# Patient Record
Sex: Male | Born: 1945
Health system: Southern US, Community
[De-identification: ages and names within clinical notes are randomized; demographics above are authoritative.]

## PROBLEM LIST (undated history)

## (undated) DIAGNOSIS — C179 Malignant neoplasm of small intestine, unspecified: Secondary | ICD-10-CM

## (undated) DIAGNOSIS — E119 Type 2 diabetes mellitus without complications: Secondary | ICD-10-CM

## (undated) DIAGNOSIS — I10 Essential (primary) hypertension: Secondary | ICD-10-CM

## (undated) HISTORY — DX: Malignant neoplasm of small intestine, unspecified: C17.9

---

## 1997-09-13 ENCOUNTER — Other Ambulatory Visit: Admission: RE | Admit: 1997-09-13 | Discharge: 1997-09-13 | Payer: Self-pay | Admitting: Urology

## 1998-08-16 ENCOUNTER — Ambulatory Visit (HOSPITAL_COMMUNITY): Admission: RE | Admit: 1998-08-16 | Discharge: 1998-08-16 | Payer: Self-pay | Admitting: Gastroenterology

## 2000-06-11 ENCOUNTER — Encounter: Admission: RE | Admit: 2000-06-11 | Discharge: 2000-09-09 | Payer: Self-pay | Admitting: Unknown Physician Specialty

## 2012-08-02 DIAGNOSIS — E1129 Type 2 diabetes mellitus with other diabetic kidney complication: Secondary | ICD-10-CM | POA: Diagnosis not present

## 2012-08-02 DIAGNOSIS — E785 Hyperlipidemia, unspecified: Secondary | ICD-10-CM | POA: Diagnosis not present

## 2012-08-02 DIAGNOSIS — I10 Essential (primary) hypertension: Secondary | ICD-10-CM | POA: Diagnosis not present

## 2012-12-20 DIAGNOSIS — Z125 Encounter for screening for malignant neoplasm of prostate: Secondary | ICD-10-CM | POA: Diagnosis not present

## 2012-12-20 DIAGNOSIS — E785 Hyperlipidemia, unspecified: Secondary | ICD-10-CM | POA: Diagnosis not present

## 2012-12-20 DIAGNOSIS — I10 Essential (primary) hypertension: Secondary | ICD-10-CM | POA: Diagnosis not present

## 2012-12-20 DIAGNOSIS — Z0000 Encounter for general adult medical examination without abnormal findings: Secondary | ICD-10-CM | POA: Diagnosis not present

## 2012-12-20 DIAGNOSIS — E1129 Type 2 diabetes mellitus with other diabetic kidney complication: Secondary | ICD-10-CM | POA: Diagnosis not present

## 2013-04-18 DIAGNOSIS — E1129 Type 2 diabetes mellitus with other diabetic kidney complication: Secondary | ICD-10-CM | POA: Diagnosis not present

## 2013-04-18 DIAGNOSIS — I10 Essential (primary) hypertension: Secondary | ICD-10-CM | POA: Diagnosis not present

## 2013-04-18 DIAGNOSIS — E785 Hyperlipidemia, unspecified: Secondary | ICD-10-CM | POA: Diagnosis not present

## 2013-05-13 DIAGNOSIS — L723 Sebaceous cyst: Secondary | ICD-10-CM | POA: Diagnosis not present

## 2013-08-30 DIAGNOSIS — E785 Hyperlipidemia, unspecified: Secondary | ICD-10-CM | POA: Diagnosis not present

## 2013-08-30 DIAGNOSIS — E1129 Type 2 diabetes mellitus with other diabetic kidney complication: Secondary | ICD-10-CM | POA: Diagnosis not present

## 2013-08-30 DIAGNOSIS — I10 Essential (primary) hypertension: Secondary | ICD-10-CM | POA: Diagnosis not present

## 2013-08-30 DIAGNOSIS — N183 Chronic kidney disease, stage 3 unspecified: Secondary | ICD-10-CM | POA: Diagnosis not present

## 2014-01-20 DIAGNOSIS — I129 Hypertensive chronic kidney disease with stage 1 through stage 4 chronic kidney disease, or unspecified chronic kidney disease: Secondary | ICD-10-CM | POA: Diagnosis not present

## 2014-01-20 DIAGNOSIS — N183 Chronic kidney disease, stage 3 unspecified: Secondary | ICD-10-CM | POA: Diagnosis not present

## 2014-01-20 DIAGNOSIS — I10 Essential (primary) hypertension: Secondary | ICD-10-CM | POA: Diagnosis not present

## 2014-01-20 DIAGNOSIS — Z1331 Encounter for screening for depression: Secondary | ICD-10-CM | POA: Diagnosis not present

## 2014-01-20 DIAGNOSIS — E1129 Type 2 diabetes mellitus with other diabetic kidney complication: Secondary | ICD-10-CM | POA: Diagnosis not present

## 2014-01-20 DIAGNOSIS — Z0000 Encounter for general adult medical examination without abnormal findings: Secondary | ICD-10-CM | POA: Diagnosis not present

## 2014-01-20 DIAGNOSIS — E7800 Pure hypercholesterolemia, unspecified: Secondary | ICD-10-CM | POA: Diagnosis not present

## 2014-01-20 DIAGNOSIS — Z125 Encounter for screening for malignant neoplasm of prostate: Secondary | ICD-10-CM | POA: Diagnosis not present

## 2014-01-20 DIAGNOSIS — Z23 Encounter for immunization: Secondary | ICD-10-CM | POA: Diagnosis not present

## 2014-03-23 DIAGNOSIS — H2513 Age-related nuclear cataract, bilateral: Secondary | ICD-10-CM | POA: Diagnosis not present

## 2014-03-23 DIAGNOSIS — H40033 Anatomical narrow angle, bilateral: Secondary | ICD-10-CM | POA: Diagnosis not present

## 2014-05-18 DIAGNOSIS — D631 Anemia in chronic kidney disease: Secondary | ICD-10-CM | POA: Diagnosis not present

## 2014-05-18 DIAGNOSIS — E1122 Type 2 diabetes mellitus with diabetic chronic kidney disease: Secondary | ICD-10-CM | POA: Diagnosis not present

## 2014-05-18 DIAGNOSIS — I10 Essential (primary) hypertension: Secondary | ICD-10-CM | POA: Diagnosis not present

## 2014-05-18 DIAGNOSIS — E785 Hyperlipidemia, unspecified: Secondary | ICD-10-CM | POA: Diagnosis not present

## 2014-05-18 DIAGNOSIS — N183 Chronic kidney disease, stage 3 (moderate): Secondary | ICD-10-CM | POA: Diagnosis not present

## 2014-05-18 DIAGNOSIS — Z23 Encounter for immunization: Secondary | ICD-10-CM | POA: Diagnosis not present

## 2014-08-17 ENCOUNTER — Encounter (HOSPITAL_COMMUNITY): Payer: Self-pay

## 2014-08-17 ENCOUNTER — Observation Stay (HOSPITAL_COMMUNITY)
Admission: EM | Admit: 2014-08-17 | Discharge: 2014-08-19 | Disposition: A | Discharge status: Home / Self Care | Payer: Medicare Other | Attending: Internal Medicine | Admitting: Internal Medicine

## 2014-08-17 ENCOUNTER — Observation Stay (HOSPITAL_COMMUNITY): Payer: Medicare Other

## 2014-08-17 DIAGNOSIS — C171 Malignant neoplasm of jejunum: Principal | ICD-10-CM | POA: Insufficient documentation

## 2014-08-17 DIAGNOSIS — Z79899 Other long term (current) drug therapy: Secondary | ICD-10-CM | POA: Diagnosis not present

## 2014-08-17 DIAGNOSIS — K922 Gastrointestinal hemorrhage, unspecified: Secondary | ICD-10-CM | POA: Diagnosis present

## 2014-08-17 DIAGNOSIS — R5383 Other fatigue: Secondary | ICD-10-CM | POA: Insufficient documentation

## 2014-08-17 DIAGNOSIS — Z7982 Long term (current) use of aspirin: Secondary | ICD-10-CM | POA: Diagnosis not present

## 2014-08-17 DIAGNOSIS — N179 Acute kidney failure, unspecified: Secondary | ICD-10-CM | POA: Diagnosis not present

## 2014-08-17 DIAGNOSIS — D649 Anemia, unspecified: Secondary | ICD-10-CM | POA: Diagnosis not present

## 2014-08-17 DIAGNOSIS — Z794 Long term (current) use of insulin: Secondary | ICD-10-CM | POA: Insufficient documentation

## 2014-08-17 DIAGNOSIS — E119 Type 2 diabetes mellitus without complications: Secondary | ICD-10-CM | POA: Insufficient documentation

## 2014-08-17 DIAGNOSIS — I10 Essential (primary) hypertension: Secondary | ICD-10-CM | POA: Insufficient documentation

## 2014-08-17 DIAGNOSIS — E43 Unspecified severe protein-calorie malnutrition: Secondary | ICD-10-CM | POA: Diagnosis present

## 2014-08-17 HISTORY — DX: Essential (primary) hypertension: I10

## 2014-08-17 HISTORY — DX: Type 2 diabetes mellitus without complications: E11.9

## 2014-08-17 LAB — CBC WITH DIFFERENTIAL/PLATELET
BASOS ABS: 0 10*3/uL (ref 0.0–0.1)
BASOS PCT: 0 % (ref 0–1)
BASOS PCT: 0 % (ref 0–1)
Basophils Absolute: 0 10*3/uL (ref 0.0–0.1)
EOS ABS: 0.2 10*3/uL (ref 0.0–0.7)
EOS PCT: 2 % (ref 0–5)
EOS PCT: 2 % (ref 0–5)
Eosinophils Absolute: 0.2 10*3/uL (ref 0.0–0.7)
HEMATOCRIT: 25.6 % — AB (ref 39.0–52.0)
HEMATOCRIT: 21.3 % — AB (ref 39.0–52.0)
Hemoglobin: 7.6 g/dL — ABNORMAL LOW (ref 13.0–17.0)
Hemoglobin: 6.5 g/dL — CL (ref 13.0–17.0)
LYMPHS ABS: 1.7 10*3/uL (ref 0.7–4.0)
Lymphocytes Relative: 13 % (ref 12–46)
Lymphocytes Relative: 20 % (ref 12–46)
Lymphs Abs: 2.1 10*3/uL (ref 0.7–4.0)
MCH: 22.8 pg — AB (ref 26.0–34.0)
MCH: 23.2 pg — AB (ref 26.0–34.0)
MCHC: 29.7 g/dL — ABNORMAL LOW (ref 30.0–36.0)
MCHC: 30.5 g/dL (ref 30.0–36.0)
MCV: 76.6 fL — ABNORMAL LOW (ref 78.0–100.0)
MCV: 76.1 fL — AB (ref 78.0–100.0)
Monocytes Absolute: 1 10*3/uL (ref 0.1–1.0)
Monocytes Absolute: 0.7 10*3/uL (ref 0.1–1.0)
Monocytes Relative: 7 % (ref 3–12)
Monocytes Relative: 7 % (ref 3–12)
NEUTROS ABS: 10.2 10*3/uL — AB (ref 1.7–7.7)
NEUTROS ABS: 7.7 10*3/uL (ref 1.7–7.7)
Neutrophils Relative %: 78 % — ABNORMAL HIGH (ref 43–77)
Neutrophils Relative %: 72 % (ref 43–77)
PLATELETS: 439 10*3/uL — AB (ref 150–400)
Platelets: 365 10*3/uL (ref 150–400)
RBC: 3.34 MIL/uL — ABNORMAL LOW (ref 4.22–5.81)
RBC: 2.8 MIL/uL — ABNORMAL LOW (ref 4.22–5.81)
RDW: 16.6 % — ABNORMAL HIGH (ref 11.5–15.5)
RDW: 16.6 % — AB (ref 11.5–15.5)
WBC: 13.1 10*3/uL — ABNORMAL HIGH (ref 4.0–10.5)
WBC: 10.8 10*3/uL — ABNORMAL HIGH (ref 4.0–10.5)

## 2014-08-17 LAB — COMPREHENSIVE METABOLIC PANEL
ALT: 21 U/L (ref 0–53)
AST: 35 U/L (ref 0–37)
Albumin: 3.4 g/dL — ABNORMAL LOW (ref 3.5–5.2)
Alkaline Phosphatase: 96 U/L (ref 39–117)
Anion gap: 9 (ref 5–15)
BILIRUBIN TOTAL: 0.5 mg/dL (ref 0.3–1.2)
BUN: 34 mg/dL — AB (ref 6–23)
CALCIUM: 9 mg/dL (ref 8.4–10.5)
CHLORIDE: 103 mmol/L (ref 96–112)
CO2: 23 mmol/L (ref 19–32)
CREATININE: 1.74 mg/dL — AB (ref 0.50–1.35)
GFR calc Af Amer: 44 mL/min — ABNORMAL LOW (ref >90)
GFR, EST NON AFRICAN AMERICAN: 38 mL/min — AB (ref >90)
Glucose, Bld: 158 mg/dL — ABNORMAL HIGH (ref 70–99)
Potassium: 4.5 mmol/L (ref 3.5–5.1)
Sodium: 135 mmol/L (ref 135–145)
Total Protein: 7.8 g/dL (ref 6.0–8.3)

## 2014-08-17 LAB — CBG MONITORING, ED: Glucose-Capillary: 109 mg/dL — ABNORMAL HIGH (ref 70–99)

## 2014-08-17 LAB — POC OCCULT BLOOD, ED: Fecal Occult Bld: POSITIVE — AB

## 2014-08-17 LAB — ABO/RH: ABO/RH(D): A POS

## 2014-08-17 MED ORDER — INSULIN ASPART 100 UNIT/ML ~~LOC~~ SOLN: 0.0000 [IU] | SUBCUTANEOUS | Status: DC

## 2014-08-17 MED ORDER — ATORVASTATIN CALCIUM 20 MG PO TABS
20.0000 mg | ORAL_TABLET | Freq: Every day | ORAL | Status: DC
  Administered 2014-08-18: 20 mg via ORAL
  Filled 2014-08-17 (×2): qty 1

## 2014-08-17 MED ORDER — INSULIN GLARGINE 100 UNIT/ML ~~LOC~~ SOLN
40.0000 [IU] | Freq: Every day | SUBCUTANEOUS | Status: DC
  Administered 2014-08-18 (×2): 40 [IU] via SUBCUTANEOUS
  Filled 2014-08-17 (×2): qty 0.4

## 2014-08-17 MED ORDER — SODIUM CHLORIDE 0.9 % IV SOLN
INTRAVENOUS | Status: DC
  Administered 2014-08-17: 23:00:00 via INTRAVENOUS

## 2014-08-17 MED ORDER — PANTOPRAZOLE SODIUM 40 MG IV SOLR
40.0000 mg | INTRAVENOUS | Status: DC
  Administered 2014-08-17: 40 mg via INTRAVENOUS
  Filled 2014-08-17: qty 40

## 2014-08-17 NOTE — ED Notes (Signed)
Pt referred to ED by Dr Delfina Redwood for low hemoglobin. Pt reports that his hemoglobin was at 7. Pt states that his blood was taken today at 1600 and was given results at 1700.  Pt reports that he went to MD about his chronic fatigue and cough.  Pt reports that he's been feeling tired 3-4 weeks.

## 2014-08-17 NOTE — ED Notes (Signed)
Called floor to give RN report x2 no answer.

## 2014-08-17 NOTE — H&P (Signed)
Hospitalist Admission History and Physical  Patient name: Tyler Evans Medical record number: [id number] Date of birth: 1945-07-02 Age: 69 y.o. Gender: male  Primary Care Provider: No primary care provider on file.  Chief Complaint: anemia, GIB, AKI   History of Present Illness:This is a 69 y.o. year old male with significant past medical history of HTN, type 2 DM presenting with anemia, GIB, AKI. Pt reports 3-4 weeks of mailaise and fatigue. Was seen by PCP today about sxs. Was redirected to ER upon notification of low hgb. Pt denies any diarrhea. No black/ tarry stools. No vomiting. No fevers or chills. Denies any excess NSAID use. On baby ASA. Had colonoscopy 5 years ago w/ small amount of polyps per pt. Has had 10 lb weight loss over past 1-2 weeks.  Presented to ER afebrile, BP in 90s-100s. WBC 13.1, hgb 7.6, Cr 1.74. Hemoccult positive.    Assessment and Plan:  Active Problems:   GIB (gastrointestinal bleeding)   Anemia   AKI (acute kidney injury)   1- Anemia  -symptomatic -likely secondary to acute blood loss in setting of GIB  -transfuse 1 unit pRBC -anemia panel  -serial CBCs  -follow  2- GIB -hemoccult positive on presentation -PPI  -hold offending agents  -GI c/s in am   3-AKI  -suspect prerenal etiology +/- hypertensive/diabetic nephropathy  -dry on exam  -BUN: Cr ratio 20:1 -Hydrate pt  -hold offending agents  -f/u imaging  4-DM -SSI  -A1C  -lantus  -hold orals   FEN/GI: NPO  Prophylaxis: SCDs  Disposition: pending further evaluation  Code Status:Full Code    Patient Active Problem List   Diagnosis Date Noted  . GIB (gastrointestinal bleeding) 08/17/2014  . Anemia 08/17/2014  . AKI (acute kidney injury) 08/17/2014   Past Medical History: Past Medical History  Diagnosis Date  . Diabetes mellitus without complication   . Hypertension     Past Surgical History: History reviewed. No pertinent past surgical history.  Social  History: History   Social History  . Marital Status: Married    Spouse Name: N/A  . Number of Children: N/A  . Years of Education: N/A   Social History Main Topics  . Smoking status: Never Smoker   . Smokeless tobacco: Not on file  . Alcohol Use: No  . Drug Use: Not on file  . Sexual Activity: Not on file   Other Topics Concern  . None   Social History Narrative  . None    Family History: Family History  Problem Relation Age of Onset  . Heart failure Mother   . Cancer Father   . Cancer Brother     Allergies: Allergies  Allergen Reactions  . Penicillins Nausea And Vomiting    Current Facility-Administered Medications  Medication Dose Route Frequency Provider Last Rate Last Dose  . 0.9 %  sodium chloride infusion   Intravenous Continuous Deneise Lever, MD      . Derrill Memo ON 08/18/2014] atorvastatin (LIPITOR) tablet 20 mg  20 mg Oral Daily Deneise Lever, MD      . Derrill Memo ON 08/18/2014] insulin aspart (novoLOG) injection 0-9 Units  0-9 Units Subcutaneous 6 times per day Deneise Lever, MD      . insulin glargine (LANTUS) injection 40 Units  40 Units Subcutaneous QHS Deneise Lever, MD      . pantoprazole (PROTONIX) injection 40 mg  40 mg Intravenous Q24H Deneise Lever, MD       Current Outpatient Prescriptions  Medication Sig Dispense Refill  . aspirin EC 81 MG tablet Take 81 mg by mouth at bedtime.    Marland Kitchen atorvastatin (LIPITOR) 20 MG tablet Take 20 mg by mouth daily.    . chlorthalidone (HYGROTON) 25 MG tablet Take 25 mg by mouth daily.    Marland Kitchen glipiZIDE (GLUCOTROL) 10 MG tablet Take 10 mg by mouth 2 (two) times daily before a meal.    . insulin glargine (LANTUS) 100 UNIT/ML injection Inject 40 Units into the skin at bedtime.    Marland Kitchen lisinopril (PRINIVIL,ZESTRIL) 40 MG tablet Take 40 mg by mouth at bedtime.    . metFORMIN (GLUCOPHAGE) 1000 MG tablet Take 1,000 mg by mouth 2 (two) times daily with a meal.     Review Of Systems: 12 point ROS negative except as noted  above in HPI.  Physical Exam: Filed Vitals:   08/17/14 2216  BP: 99/46  Pulse: 91  Temp: 98.3 F (36.8 C)  Resp: 18    General: alert and cooperative HEENT: PERRLA and extra ocular movement intact Heart: S1, S2 normal, no murmur, rub or gallop, regular rate and rhythm Lungs: clear to auscultation, no wheezes or rales and unlabored breathing Abdomen: + mild abd distension, + bowel sounds, minimal abd TTP Extremities: extremities normal, atraumatic, no cyanosis or edema Skin:no rashes Neurology: normal without focal findings  Labs and Imaging: Lab Results  Component Value Date/Time   NA 135 08/17/2014 06:24 PM   K 4.5 08/17/2014 06:24 PM   CL 103 08/17/2014 06:24 PM   CO2 23 08/17/2014 06:24 PM   BUN 34* 08/17/2014 06:24 PM   CREATININE 1.74* 08/17/2014 06:24 PM   GLUCOSE 158* 08/17/2014 06:24 PM   Lab Results  Component Value Date   WBC 13.1* 08/17/2014   HGB 7.6* 08/17/2014   HCT 25.6* 08/17/2014   MCV 76.6* 08/17/2014   PLT 439* 08/17/2014    No results found.         Shanda Howells MD  Pager: [phone]

## 2014-08-18 ENCOUNTER — Encounter (HOSPITAL_COMMUNITY): Payer: Self-pay

## 2014-08-18 ENCOUNTER — Observation Stay (HOSPITAL_COMMUNITY): Payer: Medicare Other | Admitting: Anesthesiology

## 2014-08-18 ENCOUNTER — Encounter (HOSPITAL_COMMUNITY)
Admission: EM | Disposition: A | Discharge status: Home / Self Care | Payer: Self-pay | Source: Home / Self Care | Attending: Emergency Medicine

## 2014-08-18 DIAGNOSIS — C179 Malignant neoplasm of small intestine, unspecified: Secondary | ICD-10-CM

## 2014-08-18 DIAGNOSIS — C801 Malignant (primary) neoplasm, unspecified: Secondary | ICD-10-CM

## 2014-08-18 DIAGNOSIS — D649 Anemia, unspecified: Secondary | ICD-10-CM | POA: Diagnosis not present

## 2014-08-18 DIAGNOSIS — N179 Acute kidney failure, unspecified: Secondary | ICD-10-CM

## 2014-08-18 DIAGNOSIS — C171 Malignant neoplasm of jejunum: Secondary | ICD-10-CM | POA: Diagnosis not present

## 2014-08-18 DIAGNOSIS — K922 Gastrointestinal hemorrhage, unspecified: Secondary | ICD-10-CM | POA: Diagnosis not present

## 2014-08-18 DIAGNOSIS — D62 Acute posthemorrhagic anemia: Secondary | ICD-10-CM

## 2014-08-18 HISTORY — DX: Malignant neoplasm of small intestine, unspecified: C17.9

## 2014-08-18 HISTORY — PX: ENTEROSCOPY: SHX5533

## 2014-08-18 LAB — CBC WITH DIFFERENTIAL/PLATELET
Basophils Absolute: 0 10*3/uL (ref 0.0–0.1)
Basophils Absolute: 0 10*3/uL (ref 0.0–0.1)
Basophils Relative: 0 % (ref 0–1)
Basophils Relative: 0 % (ref 0–1)
EOS ABS: 0.2 10*3/uL (ref 0.0–0.7)
EOS ABS: 0.2 10*3/uL (ref 0.0–0.7)
EOS PCT: 3 % (ref 0–5)
Eosinophils Relative: 2 % (ref 0–5)
HCT: 26.8 % — ABNORMAL LOW (ref 39.0–52.0)
HEMATOCRIT: 22.6 % — AB (ref 39.0–52.0)
HEMOGLOBIN: 7.1 g/dL — AB (ref 13.0–17.0)
Hemoglobin: 8.4 g/dL — ABNORMAL LOW (ref 13.0–17.0)
LYMPHS ABS: 1.8 10*3/uL (ref 0.7–4.0)
LYMPHS PCT: 19 % (ref 12–46)
LYMPHS PCT: 19 % (ref 12–46)
Lymphs Abs: 1.8 10*3/uL (ref 0.7–4.0)
MCH: 24.5 pg — ABNORMAL LOW (ref 26.0–34.0)
MCH: 25 pg — ABNORMAL LOW (ref 26.0–34.0)
MCHC: 31.4 g/dL (ref 30.0–36.0)
MCHC: 31.3 g/dL (ref 30.0–36.0)
MCV: 77.9 fL — AB (ref 78.0–100.0)
MCV: 79.8 fL (ref 78.0–100.0)
MONO ABS: 1 10*3/uL (ref 0.1–1.0)
MONOS PCT: 11 % (ref 3–12)
Monocytes Absolute: 0.7 10*3/uL (ref 0.1–1.0)
Monocytes Relative: 7 % (ref 3–12)
NEUTROS ABS: 6.2 10*3/uL (ref 1.7–7.7)
NEUTROS PCT: 72 % (ref 43–77)
Neutro Abs: 6.8 10*3/uL (ref 1.7–7.7)
Neutrophils Relative %: 67 % (ref 43–77)
Platelets: 372 10*3/uL (ref 150–400)
Platelets: 342 10*3/uL (ref 150–400)
RBC: 2.9 MIL/uL — ABNORMAL LOW (ref 4.22–5.81)
RBC: 3.36 MIL/uL — AB (ref 4.22–5.81)
RDW: 17.2 % — AB (ref 11.5–15.5)
RDW: 17.4 % — AB (ref 11.5–15.5)
WBC: 9.3 10*3/uL (ref 4.0–10.5)
WBC: 9.5 10*3/uL (ref 4.0–10.5)

## 2014-08-18 LAB — COMPREHENSIVE METABOLIC PANEL
ALT: 17 U/L (ref 0–53)
ANION GAP: 9 (ref 5–15)
AST: 30 U/L (ref 0–37)
Albumin: 2.8 g/dL — ABNORMAL LOW (ref 3.5–5.2)
Alkaline Phosphatase: 81 U/L (ref 39–117)
BUN: 33 mg/dL — AB (ref 6–23)
CO2: 25 mmol/L (ref 19–32)
Calcium: 8.4 mg/dL (ref 8.4–10.5)
Chloride: 103 mmol/L (ref 96–112)
Creatinine, Ser: 1.54 mg/dL — ABNORMAL HIGH (ref 0.50–1.35)
GFR, EST AFRICAN AMERICAN: 51 mL/min — AB (ref >90)
GFR, EST NON AFRICAN AMERICAN: 44 mL/min — AB (ref >90)
GLUCOSE: 64 mg/dL — AB (ref 70–99)
Potassium: 4.3 mmol/L (ref 3.5–5.1)
SODIUM: 137 mmol/L (ref 135–145)
TOTAL PROTEIN: 6.5 g/dL (ref 6.0–8.3)
Total Bilirubin: 0.6 mg/dL (ref 0.3–1.2)

## 2014-08-18 LAB — CBC
HCT: 28 % — ABNORMAL LOW (ref 39.0–52.0)
HEMOGLOBIN: 8.7 g/dL — AB (ref 13.0–17.0)
MCH: 24.9 pg — ABNORMAL LOW (ref 26.0–34.0)
MCHC: 31.1 g/dL (ref 30.0–36.0)
MCV: 80 fL (ref 78.0–100.0)
Platelets: 384 10*3/uL (ref 150–400)
RBC: 3.5 MIL/uL — ABNORMAL LOW (ref 4.22–5.81)
RDW: 17.5 % — AB (ref 11.5–15.5)
WBC: 12 10*3/uL — ABNORMAL HIGH (ref 4.0–10.5)

## 2014-08-18 LAB — GLUCOSE, CAPILLARY
GLUCOSE-CAPILLARY: 104 mg/dL — AB (ref 70–99)
GLUCOSE-CAPILLARY: 144 mg/dL — AB (ref 70–99)
Glucose-Capillary: 88 mg/dL (ref 70–99)
Glucose-Capillary: 57 mg/dL — ABNORMAL LOW (ref 70–99)
Glucose-Capillary: 121 mg/dL — ABNORMAL HIGH (ref 70–99)

## 2014-08-18 LAB — PREPARE RBC (CROSSMATCH)

## 2014-08-18 LAB — MRSA PCR SCREENING: MRSA by PCR: NEGATIVE

## 2014-08-18 SURGERY — ENTEROSCOPY
Anesthesia: Monitor Anesthesia Care

## 2014-08-18 MED ORDER — PROPOFOL 10 MG/ML IV BOLUS
INTRAVENOUS | Status: AC
  Filled 2014-08-18: qty 20

## 2014-08-18 MED ORDER — LIDOCAINE HCL 1 % IJ SOLN
INTRAMUSCULAR | Status: DC | PRN
  Administered 2014-08-18 (×2): 50 mg via INTRADERMAL

## 2014-08-18 MED ORDER — GLUCAGON HCL RDNA (DIAGNOSTIC) 1 MG IJ SOLR
INTRAMUSCULAR | Status: AC
  Filled 2014-08-18: qty 1

## 2014-08-18 MED ORDER — MIDAZOLAM HCL 2 MG/2ML IJ SOLN
INTRAMUSCULAR | Status: AC
  Filled 2014-08-18: qty 2

## 2014-08-18 MED ORDER — SODIUM CHLORIDE 0.9 % IV SOLN: INTRAVENOUS | Status: DC

## 2014-08-18 MED ORDER — GLUCAGON HCL RDNA (DIAGNOSTIC) 1 MG IJ SOLR
INTRAMUSCULAR | Status: DC | PRN
  Administered 2014-08-18: 1 mg via INTRAVENOUS

## 2014-08-18 MED ORDER — DEXTROSE 50 % IV SOLN
INTRAVENOUS | Status: AC
  Administered 2014-08-18: 10:00:00
  Filled 2014-08-18: qty 50

## 2014-08-18 MED ORDER — LIDOCAINE HCL (CARDIAC) 20 MG/ML IV SOLN
INTRAVENOUS | Status: AC
  Filled 2014-08-18: qty 5

## 2014-08-18 MED ORDER — SODIUM CHLORIDE 0.9 % IV SOLN: Freq: Once | INTRAVENOUS | Status: AC

## 2014-08-18 MED ORDER — LACTATED RINGERS IV SOLN
INTRAVENOUS | Status: DC | PRN
  Administered 2014-08-18: 13:00:00 via INTRAVENOUS

## 2014-08-18 MED ORDER — MIDAZOLAM HCL 5 MG/5ML IJ SOLN
INTRAMUSCULAR | Status: DC | PRN
  Administered 2014-08-18 (×2): 1 mg via INTRAVENOUS

## 2014-08-18 MED ORDER — PANTOPRAZOLE SODIUM 40 MG IV SOLR
40.0000 mg | Freq: Two times a day (BID) | INTRAVENOUS | Status: DC
  Administered 2014-08-18 – 2014-08-19 (×2): 40 mg via INTRAVENOUS
  Filled 2014-08-18 (×2): qty 40

## 2014-08-18 MED ORDER — PROPOFOL INFUSION 10 MG/ML OPTIME
INTRAVENOUS | Status: DC | PRN
  Administered 2014-08-18: 180 ug/kg/min via INTRAVENOUS

## 2014-08-18 MED ORDER — SODIUM CHLORIDE 0.9 % IV SOLN
Freq: Once | INTRAVENOUS | Status: AC
  Administered 2014-08-18: 03:00:00 via INTRAVENOUS

## 2014-08-18 NOTE — Progress Notes (Signed)
Pt sent to enteroscopy with 1 unit RBC hanging. RN notified GI Charge RN Linna Hoff that pt was coming with blood. Charge RN assured RN that pt's blood would be closely monitored and that RN did not need to accompany pt to scope. Vital signs and blood admin documentation noted in [facility] and transfusion paperwork is in pt's paper chart.

## 2014-08-18 NOTE — Op Note (Signed)
[facility] [place], [address]   ENTEROSCOPY PROCEDURE REPORT     EXAM DATE: 08/18/2014  PATIENT NAME:      Tyler Evans, Tyler Evans           MR #:      [id number]  BIRTHDATE:       11/08/1945      VISIT #:     [id number]  ATTENDING:     Carol Ada, MD     STATUS:     outpatient ASSISTANT:      Vangie Bicker, and Lilli Light MD: ASA CLASS:        Class III  INDICATIONS:  The patient is a 69 yr old male here for an enteroscopy procedure due to abnormal CT scan. PROCEDURE PERFORMED:     Small bowel enteroscopy with biopsy  MEDICATIONS:     Monitored anesthesia care  CONSENT: The patient understands the risks and benefits of the procedure and understands that these risks include, but are not limited to: sedation, allergic reaction, infection, perforation and/or bleeding. Alternative means of evaluation and treatment include, among others: physical exam, x-rays, and/or surgical intervention. The patient elects to proceed with this endoscopic procedure.  DESCRIPTION OF PROCEDURE: During intra-op preparation period all mechanical & medical equipment was checked for proper function. Hand hygiene and appropriate measures for infection prevention was taken. After the risks, benefits and alternatives of the procedure were thoroughly explained, Informed consent was verified, confirmed and timeout was successfully executed by the treatment team. The    endoscope was introduced through the mouth and advanced to the proximal jejunum jejunum. The prep was The overall prep quality was excellent.. The instrument was then slowly withdrawn while examining the mucosa circumferentially. The scope was then completely withdrawn from the patient and the procedure terminated. The pulse, BP, and O2 saturation were monitored and documented by the physician and the nursing staff throughout the entire procedure.  The patient was cared  for as planned according to standard protocol, then discharged to recovery in stable condition and with appropriate post procedure care. Estimated blood loss is zero unless otherwise noted in this procedure report.  FINDINGS: The esophagus and gastric lumens were normal.  The pediatric colonoscope was advanced to the proximal jejunum and there was evidence of abnormal mucosa and friability.  The colonoscope was not able to traverse the area.  Cold biopsies were obtained and then the ultraslim colonoscope was employed.  This colonscope was able to traverse the area of stenosis.  This area was approximately 3 cm in length and 50% of the lumen was ulcerated.  More portions of the abnormal mucosa were obtained. Distal to this point the lumen was normal.    ADVERSE EVENTS:      There were no immediate complications.  IMPRESSIONS:     1) Proximal jejunal stenosis/ulceration/mass.   RECOMMENDATIONS:     1) Await biopsy results.  RECALL:  _____________________________ Carol Ada, MD eSigned:  Carol Ada, MD 08/18/2014 1:17 PM   cc:     PATIENT NAME:  Tyler Evans, Tyler Evans MR#: [id number]

## 2014-08-18 NOTE — Progress Notes (Signed)
Pt CBG in AM was 54. Pt asymptomatic. Hypoglycemic protocol initiated and followed. Dextrose 50% 77mL given. 20 minutes later follow-up CBG was 121.

## 2014-08-18 NOTE — ED Notes (Addendum)
Pt awaiting CT scan before transport up stairs.

## 2014-08-18 NOTE — Consult Note (Signed)
Reason for Consult: Abnormal CT scan Referring Physician: [facility]  [name] HPI: This is a 69 year old male with a PMH of DM and HTN, who is admitted for weakness and fatigue.  The patient was identified to have an HGB os 7.6 g/dL and a CT scan revealed multiple hepatic masses as well as a filling defect in the proximal jejunum.  He was evaluated by Dr. Collene Mares 5 years ago with findings of some small polyps.  His HGB after one unit of PRBC is at 7.1 g/dL.  Past Medical History  Diagnosis Date  . Diabetes mellitus without complication   . Hypertension     History reviewed. No pertinent past surgical history.  Family History  Problem Relation Age of Onset  . Heart failure Mother   . Cancer Father   . Cancer Brother     Social History:  reports that he has never smoked. He does not have any smokeless tobacco history on file. He reports that he does not drink alcohol. His drug history is not on file.  Allergies:  Allergies  Allergen Reactions  . Penicillins Nausea And Vomiting    Medications:  Scheduled: . sodium chloride   Intravenous Once  . atorvastatin  20 mg Oral q1800  . insulin aspart  0-9 Units Subcutaneous 6 times per day  . insulin glargine  40 Units Subcutaneous QHS  . pantoprazole (PROTONIX) IV  40 mg Intravenous Q12H   Continuous: . sodium chloride 125 mL/hr at 08/17/14 2324    Results for orders placed or performed during the hospital encounter of 08/17/14 (from the past 24 hour(s))  Comprehensive metabolic panel     Status: Abnormal   Collection Time: 08/17/14  6:24 PM  Result Value Ref Range   Sodium 135 135 - 145 mmol/L   Potassium 4.5 3.5 - 5.1 mmol/L   Chloride 103 96 - 112 mmol/L   CO2 23 19 - 32 mmol/L   Glucose, Bld 158 (H) 70 - 99 mg/dL   BUN 34 (H) 6 - 23 mg/dL   Creatinine, Ser 1.74 (H) 0.50 - 1.35 mg/dL   Calcium 9.0 8.4 - 10.5 mg/dL   Total Protein 7.8 6.0 - 8.3 g/dL   Albumin 3.4 (L) 3.5 - 5.2 g/dL   AST 35 0 - 37 U/L   ALT 21 0 - 53 U/L   Alkaline Phosphatase 96 39 - 117 U/L   Total Bilirubin 0.5 0.3 - 1.2 mg/dL   GFR calc non Af Amer 38 (L) >90 mL/min   GFR calc Af Amer 44 (L) >90 mL/min   Anion gap 9 5 - 15  CBC with Differential     Status: Abnormal   Collection Time: 08/17/14  6:24 PM  Result Value Ref Range   WBC 13.1 (H) 4.0 - 10.5 K/uL   RBC 3.34 (L) 4.22 - 5.81 MIL/uL   Hemoglobin 7.6 (L) 13.0 - 17.0 g/dL   HCT 25.6 (L) 39.0 - 52.0 %   MCV 76.6 (L) 78.0 - 100.0 fL   MCH 22.8 (L) 26.0 - 34.0 pg   MCHC 29.7 (L) 30.0 - 36.0 g/dL   RDW 16.6 (H) 11.5 - 15.5 %   Platelets 439 (H) 150 - 400 K/uL   Neutrophils Relative % 78 (H) 43 - 77 %   Neutro Abs 10.2 (H) 1.7 - 7.7 K/uL   Lymphocytes Relative 13 12 - 46 %   Lymphs Abs 1.7 0.7 - 4.0 K/uL   Monocytes Relative  7 3 - 12 %   Monocytes Absolute 1.0 0.1 - 1.0 K/uL   Eosinophils Relative 2 0 - 5 %   Eosinophils Absolute 0.2 0.0 - 0.7 K/uL   Basophils Relative 0 0 - 1 %   Basophils Absolute 0.0 0.0 - 0.1 K/uL  Type and screen for Red Blood Exchange     Status: None (Preliminary result)   Collection Time: 08/17/14  6:30 PM  Result Value Ref Range   ABO/RH(D) A POS    Antibody Screen NEG    Sample Expiration 08/20/2014    Unit Number [id number]    Blood Component Type RED CELLS,LR    Unit division 00    Status of Unit ISSUED    Transfusion Status OK TO TRANSFUSE    Crossmatch Result Compatible    Unit Number [id number]    Blood Component Type RED CELLS,LR    Unit division 00    Status of Unit ISSUED    Transfusion Status OK TO TRANSFUSE    Crossmatch Result Compatible   ABO/Rh     Status: None   Collection Time: 08/17/14  6:30 PM  Result Value Ref Range   ABO/RH(D) A POS   POC occult blood, ED     Status: Abnormal   Collection Time: 08/17/14  9:01 PM  Result Value Ref Range   Fecal Occult Bld POSITIVE (A) NEGATIVE  CBC WITH DIFFERENTIAL     Status: Abnormal   Collection Time: 08/17/14 11:23 PM  Result Value Ref Range   WBC  10.8 (H) 4.0 - 10.5 K/uL   RBC 2.80 (L) 4.22 - 5.81 MIL/uL   Hemoglobin 6.5 (LL) 13.0 - 17.0 g/dL   HCT 21.3 (L) 39.0 - 52.0 %   MCV 76.1 (L) 78.0 - 100.0 fL   MCH 23.2 (L) 26.0 - 34.0 pg   MCHC 30.5 30.0 - 36.0 g/dL   RDW 16.6 (H) 11.5 - 15.5 %   Platelets 365 150 - 400 K/uL   Neutrophils Relative % 72 43 - 77 %   Neutro Abs 7.7 1.7 - 7.7 K/uL   Lymphocytes Relative 20 12 - 46 %   Lymphs Abs 2.1 0.7 - 4.0 K/uL   Monocytes Relative 7 3 - 12 %   Monocytes Absolute 0.7 0.1 - 1.0 K/uL   Eosinophils Relative 2 0 - 5 %   Eosinophils Absolute 0.2 0.0 - 0.7 K/uL   Basophils Relative 0 0 - 1 %   Basophils Absolute 0.0 0.0 - 0.1 K/uL  CBG monitoring, ED     Status: Abnormal   Collection Time: 08/17/14 11:47 PM  Result Value Ref Range   Glucose-Capillary 109 (H) 70 - 99 mg/dL   Comment 1 Notify RN    Comment 2 Document in Chart   MRSA PCR Screening     Status: None   Collection Time: 08/18/14 12:45 AM  Result Value Ref Range   MRSA by PCR NEGATIVE NEGATIVE  Prepare RBC     Status: None   Collection Time: 08/18/14  1:30 AM  Result Value Ref Range   Order Confirmation ORDER PROCESSED BY BLOOD BANK   Glucose, capillary     Status: None   Collection Time: 08/18/14  3:41 AM  Result Value Ref Range   Glucose-Capillary 88 70 - 99 mg/dL  Prepare RBC     Status: None   Collection Time: 08/18/14  6:18 AM  Result Value Ref Range   Order Confirmation ORDER PROCESSED BY BLOOD BANK  Comprehensive metabolic panel     Status: Abnormal   Collection Time: 08/18/14  7:03 AM  Result Value Ref Range   Sodium 137 135 - 145 mmol/L   Potassium 4.3 3.5 - 5.1 mmol/L   Chloride 103 96 - 112 mmol/L   CO2 25 19 - 32 mmol/L   Glucose, Bld 64 (L) 70 - 99 mg/dL   BUN 33 (H) 6 - 23 mg/dL   Creatinine, Ser 1.54 (H) 0.50 - 1.35 mg/dL   Calcium 8.4 8.4 - 10.5 mg/dL   Total Protein 6.5 6.0 - 8.3 g/dL   Albumin 2.8 (L) 3.5 - 5.2 g/dL   AST 30 0 - 37 U/L   ALT 17 0 - 53 U/L   Alkaline Phosphatase 81 39 - 117  U/L   Total Bilirubin 0.6 0.3 - 1.2 mg/dL   GFR calc non Af Amer 44 (L) >90 mL/min   GFR calc Af Amer 51 (L) >90 mL/min   Anion gap 9 5 - 15  CBC WITH DIFFERENTIAL     Status: Abnormal   Collection Time: 08/18/14  7:03 AM  Result Value Ref Range   WBC 9.3 4.0 - 10.5 K/uL   RBC 2.90 (L) 4.22 - 5.81 MIL/uL   Hemoglobin 7.1 (L) 13.0 - 17.0 g/dL   HCT 22.6 (L) 39.0 - 52.0 %   MCV 77.9 (L) 78.0 - 100.0 fL   MCH 24.5 (L) 26.0 - 34.0 pg   MCHC 31.4 30.0 - 36.0 g/dL   RDW 17.2 (H) 11.5 - 15.5 %   Platelets 372 150 - 400 K/uL   Neutrophils Relative % 67 43 - 77 %   Neutro Abs 6.2 1.7 - 7.7 K/uL   Lymphocytes Relative 19 12 - 46 %   Lymphs Abs 1.8 0.7 - 4.0 K/uL   Monocytes Relative 11 3 - 12 %   Monocytes Absolute 1.0 0.1 - 1.0 K/uL   Eosinophils Relative 3 0 - 5 %   Eosinophils Absolute 0.2 0.0 - 0.7 K/uL   Basophils Relative 0 0 - 1 %   Basophils Absolute 0.0 0.0 - 0.1 K/uL     Ct Abdomen Pelvis Wo Contrast  08/18/2014   CLINICAL DATA:  Chronic fatigue and cough. Feeling tired for the past 3-4 weeks. Positive hemoccult. Decreased hemoglobin. Initial encounter.  EXAM: CT ABDOMEN AND PELVIS WITHOUT CONTRAST  TECHNIQUE: Multidetector CT imaging of the abdomen and pelvis was performed following the standard protocol without IV contrast.  COMPARISON:  None.  FINDINGS: The visualized lung bases are clear.  Multiple large masses are seen within the liver, bulky in appearance. The largest of these measures perhaps 5.7 cm, though all of the masses are relatively large. This is unusual for a primary hepatic malignancy, though it remains a possibility. Metastatic disease is a concern. The spleen is unremarkable.  Stones are noted within the gallbladder; the gallbladder is otherwise unremarkable in appearance. The pancreas and adrenal glands are within normal limits.  There appears to be a focal filling defect within the proximal jejunum, measuring approximately 2.5 x 1.7 cm, with surrounding contrast.  There is mild adjacent jejunal wall thickening. Malignancy cannot be excluded. Capsule endoscopy could be considered for further evaluation, as deemed clinically appropriate.  Mild nonspecific perinephric stranding is noted bilaterally. The kidneys are otherwise unremarkable. There is no evidence of hydronephrosis. No renal or ureteral stones are seen.  No free fluid is identified. The small bowel is unremarkable in appearance. The stomach is within normal  limits. No acute vascular abnormalities are seen.  The appendix is normal in caliber, without evidence of appendicitis. Scattered diverticulosis is noted along the descending and proximal sigmoid colon, without evidence of diverticulitis. The colon is otherwise unremarkable.  The bladder is decompressed and not well assessed. The prostate is enlarged, measuring 5.3 cm in transverse dimension. No inguinal lymphadenopathy is seen.  No acute osseous abnormalities are identified.  IMPRESSION: 1. Multiple large masses noted within the liver, bulky in appearance. These measure up to 5.7 cm in size; all of the masses are relatively large. This is unusual for primary hepatic malignancy, though it remains a possibility. Metastatic disease is a concern. Would correlate with LFTs; these lesions may be the most amenable to biopsy. 2. Focal filling defect within the proximal jejunum, measuring 2.5 x 1.7 cm, with surrounding contrast. Mild adjacent jejunal wall thickening. Malignancy cannot be excluded. Capsule endoscopy could be considered for further evaluation, as deemed clinically appropriate. 3. Scattered diverticulosis along the descending and proximal sigmoid colon, without evidence of diverticulitis. 4. Enlarged prostate noted. 5. Mild cholelithiasis noted; gallbladder otherwise unremarkable.   Electronically Signed   By: Garald Balding M.D.   On: 08/18/2014 01:16    ROS:  As stated above in the HPI otherwise negative.  Blood pressure 117/45, pulse 68, temperature  98 F (36.7 C), temperature source Oral, resp. rate 16, height 5\' 6"  (1.676 m), weight 96.9 kg (213 lb 10 oz), SpO2 99 %.    PE: Gen: NAD, Alert and Oriented HEENT:  [place]/AT, EOMI Neck: Supple, no LAD Lungs: CTA Bilaterally CV: RRR without M/G/R ABM: Soft, NTND, +BS Ext: No C/C/E  Assessment/Plan: 1) Abnormal Jejunal filling defect. 2) Hepatic mets.   I will perform an enteroscopy today.  If it is negative, biopsy of the hepatic masses will be the next step.  If it is an adenocarcinoma, a repeat colonoscopy can be pursued.  Plan: 1) Enteroscopy now.  Jaylaa Gallion D 08/18/2014, 10:59 AM

## 2014-08-18 NOTE — Care Management Note (Signed)
CARE MANAGEMENT NOTE 08/18/2014  Patient:  Tyler Evans, Tyler Evans   Account Number:  [id number]  Date Initiated:  08/18/2014  Documentation initiated by:  DAVIS,RHONDA  Subjective/Objective Assessment:   gi bld and hgtb drop down to 6.5     Action/Plan:   home when stable   Anticipated DC Date:  08/21/2014   Anticipated DC Plan:  HOME/SELF CARE  In-house referral  NA      DC Planning Services  CM consult      PAC Choice  NA   Choice offered to / List presented to:  NA   DME arranged  NA           Status of service:  In process, will continue to follow Medicare Important Message given?   (If response is "NO", the following Medicare IM given date fields will be blank) Date Medicare IM given:   Medicare IM given by:   Date Additional Medicare IM given:   Additional Medicare IM given by:    Discharge Disposition:    Per UR Regulation:  Reviewed for med. necessity/level of care/duration of stay  If discussed at [place] of Stay Meetings, dates discussed:    Comments:  August 18, 2014/Rhonda L. Rosana Hoes, RN, BSN, CCM. Case Management [place] [phone] No discharge needs present of time of review. Gi bld hemo-ocult positive stools, tarry stools, hgb 6.5, bp lower than normal for patient. Recieved bld product and hgb now at 7.1

## 2014-08-18 NOTE — Anesthesia Preprocedure Evaluation (Signed)
Anesthesia Evaluation  Patient identified by MRN, date of birth, ID band Patient awake    Reviewed: Allergy & Precautions, NPO status , Patient's Chart, lab work & pertinent test results  Airway Mallampati: II  TM Distance: >3 FB Neck ROM: Full    Dental no notable dental hx.    Pulmonary neg pulmonary ROS,  breath sounds clear to auscultation  Pulmonary exam normal       Cardiovascular hypertension, Pt. on medications negative cardio ROS  Rhythm:Regular Rate:Normal     Neuro/Psych negative neurological ROS  negative psych ROS   GI/Hepatic negative GI ROS, Neg liver ROS,   Endo/Other  negative endocrine ROSdiabetes, Type 2, Oral Hypoglycemic Agents, Insulin Dependent  Renal/GU negative Renal ROS  negative genitourinary   Musculoskeletal negative musculoskeletal ROS (+)   Abdominal   Peds negative pediatric ROS (+)  Hematology negative hematology ROS (+)   Anesthesia Other Findings   Reproductive/Obstetrics negative OB ROS                             Anesthesia Physical Anesthesia Plan  ASA: II  Anesthesia Plan: MAC   Post-op Pain Management:    Induction: Intravenous  Airway Management Planned:   Additional Equipment:   Intra-op Plan:   Post-operative Plan:   Informed Consent: I have reviewed the patients History and Physical, chart, labs and discussed the procedure including the risks, benefits and alternatives for the proposed anesthesia with the patient or authorized representative who has indicated his/her understanding and acceptance.   Dental advisory given  Plan Discussed with: CRNA  Anesthesia Plan Comments:         Anesthesia Quick Evaluation

## 2014-08-18 NOTE — Progress Notes (Signed)
INITIAL NUTRITION ASSESSMENT  DOCUMENTATION CODES Per approved criteria  -Severe malnutrition in the context of chronic illness -Obesity Unspecified  Pt meets criteria for severe MALNUTRITION in the context of chronic illness as evidenced by 8.5% wt loss in >/= 1 month and reported po <75% of estimated needs for >1 month.  INTERVENTION: - Once diet advanced, add Glucerna Shake po TID, each supplement provides 220 kcal and 10 grams of protein - Diet advancement per MD - RD will continue to monitor  NUTRITION DIAGNOSIS: Inadequate oral intake related to inability to eat as evidenced by NPO.   Goal: Pt to meet >/= 90% of their estimated nutrition needs   Monitor:  Diet advancement, labs, po intake, weight  Reason for Assessment: Malnutrition Screening Tool  69 y.o. male  Admitting Dx: <principal problem not specified>  ASSESSMENT: 69 year old male with a PMH of DM and HTN, who is admitted for weakness and fatigue. The patient was identified to have an HGB os 7.6 g/dL and a CT scan revealed multiple hepatic masses as well as a filling defect in the proximal jejunum.  - Pt s/p enteroscopy 3/18 - Pt reports poor po for ~ 1 month prior to admission. He said that he "just didn't have an appetite." Reported 20 lb wt loss in the past month.  - Currently NPO due to GI bleed.  - Agreed to try Glucerna Shakes once diet advanced to improve po intake. Discussed foods that are good to increase calories and protein in diet.  - Labs reviewed - No signs of fat or muscle depletion.   Height: Ht Readings from Last 1 Encounters:  08/18/14 5\' 6"  (1.676 m)    Weight: Wt Readings from Last 1 Encounters:  08/18/14 213 lb 10 oz (96.9 kg)    Ideal Body Weight: 63.8 kg  % Ideal Body Weight: 152%  Wt Readings from Last 10 Encounters:  08/18/14 213 lb 10 oz (96.9 kg)    Usual Body Weight: 233 lbs  % Usual Body Weight: 91%  BMI:  Body mass index is 34.5 kg/(m^2).  Estimated  Nutritional Needs: Kcal: 1900-2100 Protein: 125-135 g Fluid: 2.0 L/day  Skin: intact  Diet Order: Diet NPO time specified  EDUCATION NEEDS: -Education needs addressed   Intake/Output Summary (Last 24 hours) at 08/18/14 1441 Last data filed at 08/18/14 1319  Gross per 24 hour  Intake   1515 ml  Output      0 ml  Net   1515 ml    Last BM: prior to admission   Labs:   Recent Labs Lab 08/17/14 1824 08/18/14 0703  NA 135 137  K 4.5 4.3  CL 103 103  CO2 23 25  BUN 34* 33*  CREATININE 1.74* 1.54*  CALCIUM 9.0 8.4  GLUCOSE 158* 64*    CBG (last 3)   Recent Labs  08/18/14 0921 08/18/14 1027 08/18/14 1409  GLUCAP 57* 121* 104*    Scheduled Meds: . sodium chloride   Intravenous Once  . atorvastatin  20 mg Oral q1800  . insulin aspart  0-9 Units Subcutaneous 6 times per day  . insulin glargine  40 Units Subcutaneous QHS  . pantoprazole (PROTONIX) IV  40 mg Intravenous Q12H    Continuous Infusions: . sodium chloride 125 mL/hr at 08/17/14 2324  . sodium chloride      Past Medical History  Diagnosis Date  . Diabetes mellitus without complication   . Hypertension     History reviewed. No pertinent past  surgical history.  Laurette Schimke [place], [place], [place]

## 2014-08-18 NOTE — Progress Notes (Signed)
Patient ID: Tyler Evans, male   DOB: Jul 05, 1945, 69 y.o.   MRN: [id number] [facility] PROGRESS NOTE  CHUE BERKOVICH NTI:[id number] DOB: [birth year] DOA: 08/17/2014 PCP: No primary care provider on file.  Brief narrative:    69 year old male with significant past medical history of hypertension, diabetes who presented to [facility] ED with ongoing weakness and fatigue for past couple of weeks prior to this admission. Patient saw primary care physician prior to the admission and he was sent to emergency room for evaluation because of low hemoglobin. Patient did not report any bleeding per rectum. Of note, he did report weight loss of about 10 pounds in past 2 weeks prior to this admission. On admission, blood pressure was 99/46 but it has improved to 120/52. Patient was afebrile and oxygen saturation was 97% on room air. Blood work revealed white blood cell count of 13.1, hemoglobin 7.6 and subsequently 6.5. Patient has received 1 unit of blood so far. Also note he had colonoscopy 5 years ago and per patient he had a small amount of polyps. We do not have report in Epic. Patient was admitted to stepdown unit because of hypotension and monitoring for bleed. GI will see the patient in consultation.   Assessment/Plan:    Principal problem: Acute GI bleed / acute blood loss anemia  - Suspect occult malignancy. CT abdomen on the admission showed multiple large masses within the liver as well as additional finding of a focal filling defect within the proximal jejunum measuring 2.5 x 1.7 cm with mild adjacent jejunal wall thickening. - Hemoglobin as low as 6.5 on the admission. Patient received 1 unit of PRBC transfusion so far. Order placed for another unit to be received this morning. - Appreciate the GI consult for recommendations. - Continue IV fluids for supportive care. - Continue Protonix 40 mg but change to frequency to twice daily instead of once a day. - Off note, liver function enzymes  within normal limits. - Continue to monitor in step down unit for next 24 hours.  Active Problems: Acute renal failure - Possibly from GI bleed, hypotension - Renal function improving with IV fluids. Creatinine is 1.54 this morning.  Leukocytosis - Patient presented with white blood cell count of 13.1. Urinalysis and chest x-ray not obtained at the time of the admission. No evidence of acute infection. - White blood cell count is within normal limits this morning.  Diabetes mellitus, without complications - Check Q6P. - Continue Lantus 40 units at bedtime along with sliding scale insulin.  Dyslipidemia - Continue statin therapy    DVT Prophylaxis  - SCD's bilaterally    Code Status: Full.  Family Communication:  plan of care discussed with the patient Disposition Plan: due to risk of bleeding will remain in SDU.   IV access:  Peripheral IV  Procedures and diagnostic studies:    Ct Abdomen Pelvis Wo Contrast 08/18/2014   1. Multiple large masses noted within the liver, bulky in appearance. These measure up to 5.7 cm in size; all of the masses are relatively large. This is unusual for primary hepatic malignancy, though it remains a possibility. Metastatic disease is a concern. Would correlate with LFTs; these lesions may be the most amenable to biopsy. 2. Focal filling defect within the proximal jejunum, measuring 2.5 x 1.7 cm, with surrounding contrast. Mild adjacent jejunal wall thickening. Malignancy cannot be excluded. Capsule endoscopy could be considered for further evaluation, as deemed clinically appropriate. 3. Scattered diverticulosis along the  descending and proximal sigmoid colon, without evidence of diverticulitis. 4. Enlarged prostate noted. 5. Mild cholelithiasis noted; gallbladder otherwise unremarkable.   Electronically Signed   By: Garald Balding M.D.   On: 08/18/2014 01:16    Medical Consultants:  Gastroenterology  Other Consultants:  None   IAnti-Infectives:    None    Leisa Lenz, MD  [facility] Pager [phone]  If 7PM-7AM, please contact night-coverage [email or web address] Password [facility] 08/18/2014, 10:06 AM      HPI/Subjective: No acute overnight events.  Objective: Filed Vitals:   08/18/14 0800 08/18/14 0900 08/18/14 0935 08/18/14 1000  BP: 112/57 114/46 120/50 117/45  Pulse: 69 70 70 68  Temp: 98 F (36.7 C)  98 F (36.7 C)   TempSrc: Oral  Oral   Resp: 18 17 17 16   Height:      Weight:      SpO2: 99% 98% 99% 99%    Intake/Output Summary (Last 24 hours) at 08/18/14 1006 Last data filed at 08/18/14 0935  Gross per 24 hour  Intake    615 ml  Output      0 ml  Net    615 ml    Exam:   General:  Pt is alert, follows commands appropriately, not in acute distress  Cardiovascular: Regular rate and rhythm, S1/S2, no murmurs  Respiratory: Clear to auscultation bilaterally, no wheezing, no crackles, no rhonchi  Abdomen: Soft, non tender, non distended, bowel sounds present  Extremities: No edema, pulses DP and PT palpable bilaterally  Neuro: Grossly nonfocal  Data Reviewed: Basic Metabolic Panel:  Recent Labs Lab 08/17/14 1824 08/18/14 0703  NA 135 137  K 4.5 4.3  CL 103 103  CO2 23 25  GLUCOSE 158* 64*  BUN 34* 33*  CREATININE 1.74* 1.54*  CALCIUM 9.0 8.4   Liver Function Tests:  Recent Labs Lab 08/17/14 1824 08/18/14 0703  AST 35 30  ALT 21 17  ALKPHOS 96 81  BILITOT 0.5 0.6  PROT 7.8 6.5  ALBUMIN 3.4* 2.8*   No results for input(s): LIPASE, AMYLASE in the last 168 hours. No results for input(s): AMMONIA in the last 168 hours. CBC:  Recent Labs Lab 08/17/14 1824 08/17/14 2323 08/18/14 0703  WBC 13.1* 10.8* 9.3  NEUTROABS 10.2* 7.7 6.2  HGB 7.6* 6.5* 7.1*  HCT 25.6* 21.3* 22.6*  MCV 76.6* 76.1* 77.9*  PLT 439* 365 372   Cardiac Enzymes: No results for input(s): CKTOTAL, CKMB, CKMBINDEX, TROPONINI in the last 168 hours. BNP: Invalid input(s): POCBNP CBG:  Recent  Labs Lab 08/17/14 2347 08/18/14 0341  GLUCAP 109* 88    Recent Results (from the past 240 hour(s))  MRSA PCR Screening     Status: None   Collection Time: 08/18/14 12:45 AM  Result Value Ref Range Status   MRSA by PCR NEGATIVE NEGATIVE Final     Scheduled Meds: . sodium chloride   Intravenous Once  . atorvastatin  20 mg Oral q1800  . insulin aspart  0-9 Units Subcutaneous 6 times per day  . insulin glargine  40 Units Subcutaneous QHS  . pantoprazole (PROTONIX) IV  40 mg Intravenous Q24H   Continuous Infusions: . sodium chloride 125 mL/hr at 08/17/14 2324

## 2014-08-18 NOTE — Anesthesia Postprocedure Evaluation (Signed)
  Anesthesia Post-op Note  Patient: Tyler Evans  Procedure(s) Performed: Procedure(s) (LRB): ENTEROSCOPY (N/A)  Patient Location: PACU  Anesthesia Type: MAC  Level of Consciousness: awake and alert   Airway and Oxygen Therapy: Patient Spontanous Breathing  Post-op Pain: mild  Post-op Assessment: Post-op Vital signs reviewed, Patient's Cardiovascular Status Stable, Respiratory Function Stable, Patent Airway and No signs of Nausea or vomiting  Last Vitals:  Filed Vitals:   08/18/14 1350  BP: 130/66  Pulse: 65  Temp:   Resp: 18    Post-op Vital Signs: stable   Complications: No apparent anesthesia complications

## 2014-08-18 NOTE — Transfer of Care (Signed)
Immediate Anesthesia Transfer of Care Note  Patient: Tyler Evans  Procedure(s) Performed: Procedure(s): ENTEROSCOPY (N/A)  Patient Location: PACU and Endoscopy Unit  Anesthesia Type:MAC  Level of Consciousness: awake, oriented and patient cooperative  Airway & Oxygen Therapy: Patient Spontanous Breathing and Patient connected to nasal cannula oxygen  Post-op Assessment: Report given to RN and Post -op Vital signs reviewed and stable  Post vital signs: Reviewed and stable  Last Vitals:  Filed Vitals:   08/18/14 1205  BP: 138/62  Pulse: 75  Temp: 36.6 C  Resp: 18    Complications: No apparent anesthesia complications

## 2014-08-19 DIAGNOSIS — E43 Unspecified severe protein-calorie malnutrition: Secondary | ICD-10-CM | POA: Diagnosis present

## 2014-08-19 DIAGNOSIS — E119 Type 2 diabetes mellitus without complications: Secondary | ICD-10-CM | POA: Insufficient documentation

## 2014-08-19 LAB — GLUCOSE, CAPILLARY
GLUCOSE-CAPILLARY: 69 mg/dL — AB (ref 70–99)
GLUCOSE-CAPILLARY: 105 mg/dL — AB (ref 70–99)
Glucose-Capillary: 131 mg/dL — ABNORMAL HIGH (ref 70–99)

## 2014-08-19 LAB — TYPE AND SCREEN
ABO/RH(D): A POS
ANTIBODY SCREEN: NEGATIVE
Unit division: 0
Unit division: 0

## 2014-08-19 LAB — CBC WITH DIFFERENTIAL/PLATELET
BASOS ABS: 0 10*3/uL (ref 0.0–0.1)
BASOS PCT: 0 % (ref 0–1)
EOS PCT: 2 % (ref 0–5)
Eosinophils Absolute: 0.3 10*3/uL (ref 0.0–0.7)
HEMATOCRIT: 27.2 % — AB (ref 39.0–52.0)
Hemoglobin: 8.4 g/dL — ABNORMAL LOW (ref 13.0–17.0)
LYMPHS ABS: 1.9 10*3/uL (ref 0.7–4.0)
LYMPHS PCT: 17 % (ref 12–46)
MCH: 24.9 pg — ABNORMAL LOW (ref 26.0–34.0)
MCHC: 30.9 g/dL (ref 30.0–36.0)
MCV: 80.5 fL (ref 78.0–100.0)
Monocytes Absolute: 1 10*3/uL (ref 0.1–1.0)
Monocytes Relative: 9 % (ref 3–12)
Neutro Abs: 7.8 10*3/uL — ABNORMAL HIGH (ref 1.7–7.7)
Neutrophils Relative %: 72 % (ref 43–77)
Platelets: 362 10*3/uL (ref 150–400)
RBC: 3.38 MIL/uL — AB (ref 4.22–5.81)
RDW: 17.5 % — AB (ref 11.5–15.5)
WBC: 10.9 10*3/uL — AB (ref 4.0–10.5)

## 2014-08-19 LAB — COMPREHENSIVE METABOLIC PANEL
ALT: 17 U/L (ref 0–53)
ANION GAP: 10 (ref 5–15)
AST: 32 U/L (ref 0–37)
Albumin: 2.8 g/dL — ABNORMAL LOW (ref 3.5–5.2)
Alkaline Phosphatase: 82 U/L (ref 39–117)
BUN: 22 mg/dL (ref 6–23)
CO2: 24 mmol/L (ref 19–32)
Calcium: 8.7 mg/dL (ref 8.4–10.5)
Chloride: 108 mmol/L (ref 96–112)
Creatinine, Ser: 1.26 mg/dL (ref 0.50–1.35)
GFR calc Af Amer: 65 mL/min — ABNORMAL LOW (ref >90)
GFR calc non Af Amer: 56 mL/min — ABNORMAL LOW (ref >90)
Glucose, Bld: 72 mg/dL (ref 70–99)
Potassium: 4.5 mmol/L (ref 3.5–5.1)
SODIUM: 142 mmol/L (ref 135–145)
Total Bilirubin: 0.8 mg/dL (ref 0.3–1.2)
Total Protein: 6.3 g/dL (ref 6.0–8.3)

## 2014-08-19 LAB — HEMOGLOBIN A1C
Hgb A1c MFr Bld: 7.7 % — ABNORMAL HIGH (ref 4.8–5.6)
Mean Plasma Glucose: 174 mg/dL

## 2014-08-19 MED ORDER — PANTOPRAZOLE SODIUM 40 MG PO TBEC: 40.0000 mg | DELAYED_RELEASE_TABLET | Freq: Every day | ORAL | Status: DC

## 2014-08-19 NOTE — Progress Notes (Signed)
Pt discharged to home. Discharge information given with male family members at bedside. Prescription x 1 given. No concerns voiced. Pt left unit in wheelchair pushed by nurse tech accompanied by family members. Left in good condition. Vwilliams,rn.

## 2014-08-19 NOTE — Discharge Summary (Signed)
Physician Discharge Summary  Tyler Evans PNT:[id number] DOB: 11/01/1945 DOA: 08/17/2014  PCP: No primary care provider on file.  Admit date: 08/17/2014 Discharge date: 08/19/2014  Recommendations for Outpatient Follow-up:  1. Please hold aspirin for one week so your hemoglobin can stay at stable level. Follow up with PCP in about 1 week to make sure your symptoms are stable. 2. GI office and out hematology/oncology office will also follow up on biopsy results once they are available we will call you with the findings and we will arrange proper follow up.  Discharge Diagnoses:  Active Problems:   GIB (gastrointestinal bleeding)   Anemia   AKI (acute kidney injury)   Protein-calorie malnutrition, severe   Diabetes mellitus without complication    Discharge Condition: stable; pt did not want to wait for biopsy results   Diet recommendation: as tolerated   History of present illness:   69 year old male with significant past medical history of hypertension, diabetes who presented to [facility] ED with ongoing weakness and fatigue for past couple of weeks prior to this admission. Patient saw primary care physician prior to the admission and he was sent to emergency room for evaluation because of low hemoglobin. Patient did not report any bleeding per rectum. Of note, he did report weight loss of about 10 pounds in past 2 weeks prior to this admission.  On admission, blood pressure was 99/46 but it has improved to 120/52. Patient was afebrile and oxygen saturation was 97% on room air. Blood work revealed white blood cell count of 13.1, hemoglobin 7.6 and subsequently 6.5. Patient has received 1 unit of blood so far. Also note he had colonoscopy 5 years ago and per patient he had a small amount of polyps. Patient was admitted to stepdown unit because of hypotension and monitoring for bleed. GI saw the pt in consultation. Pt underwent enteroscopy with biopsies obtained.    Assessment/Plan:     Principal problem: Acute GI bleed / acute blood loss anemia  - Suspect occult malignancy. CT abdomen on the admission showed multiple large masses within the liver as well as additional finding of a focal filling defect within the proximal jejunum measuring 2.5 x 1.7 cm with mild adjacent jejunal wall thickening. LFT's were WNL. - Hemoglobin as low as 6.5 on the admission. Patient received 2 units of PRBC transfusion in hospital. - GI has seen the pt in consultation and enteroscopy done 08/18/2014 with biopsies obtained. - Continue Protonix 40 mg on discharge as prescribed.    Active Problems: Acute renal failure - Possibly from GI bleed, hypotension - Renal function improved with IV fluids   Leukocytosis - Patient presented with white blood cell count of 13.1. Urinalysis and chest x-ray not obtained at the time of the admission. No evidence of acute infection. - White blood cell count subsequently normalized.   Diabetes mellitus, without complications - G8Q 7.7, goal is 7 or less. He will follow up with PCP outpt.  - Continue Lantus 40 units at bedtime and metformin   Dyslipidemia - Continue statin therapy    DVT Prophylaxis  - SCD's bilaterally during hospital stay    Code Status: Full.  Family Communication: plan of care discussed with the patient    IV access:  Peripheral IV  Procedures and diagnostic studies:   Ct Abdomen Pelvis Wo Contrast 08/18/2014 1. Multiple large masses noted within the liver, bulky in appearance. These measure up to 5.7 cm in size; all of the masses are relatively  large. This is unusual for primary hepatic malignancy, though it remains a possibility. Metastatic disease is a concern. Would correlate with LFTs; these lesions may be the most amenable to biopsy. 2. Focal filling defect within the proximal jejunum, measuring 2.5 x 1.7 cm, with surrounding contrast. Mild adjacent jejunal wall thickening. Malignancy cannot be excluded. Capsule  endoscopy could be considered for further evaluation, as deemed clinically appropriate. 3. Scattered diverticulosis along the descending and proximal sigmoid colon, without evidence of diverticulitis. 4. Enlarged prostate noted. 5. Mild cholelithiasis noted; gallbladder otherwise unremarkable.   Enteroscopy 08/18/2014 by Dr. Carol Ada - Proximal jejunal stenosis/ulceration/mass.  Medical Consultants:  Gastroenterology  Other Consultants:  None   IAnti-Infectives:   None   Signed:  Leisa Lenz, MD  [facility] 08/19/2014, 9:40 AM  Pager #: [phone]   Discharge Exam: Filed Vitals:   08/19/14 0403  BP: 120/62  Pulse: 72  Temp: 98.3 F (36.8 C)  Resp: 14   Filed Vitals:   08/18/14 2000 08/18/14 2016 08/19/14 0000 08/19/14 0403  BP: 113/47  103/42 120/62  Pulse: 70  67 72  Temp:  97.8 F (36.6 C) 98.6 F (37 C) 98.3 F (36.8 C)  TempSrc:  Oral    Resp: 16  18 14   Height:      Weight:      SpO2: 98%  98% 99%    General: Pt is alert, follows commands appropriately, not in acute distress Cardiovascular: Regular rate and rhythm, S1/S2 +, no murmurs Respiratory: Clear to auscultation bilaterally, no wheezing, no crackles, no rhonchi Abdominal: Soft, non tender, non distended, bowel sounds +, no guarding Extremities: no edema, no cyanosis, pulses palpable bilaterally DP and PT Neuro: Grossly nonfocal  Discharge Instructions  Discharge Instructions    Call MD for:  difficulty breathing, headache or visual disturbances    Complete by:  As directed      Call MD for:  persistant nausea and vomiting    Complete by:  As directed      Call MD for:  severe uncontrolled pain    Complete by:  As directed      Diet - low sodium heart healthy    Complete by:  As directed      Discharge instructions    Complete by:  As directed   1. Please hold aspirin for one week so your hemoglobin can stay at stable level. Follow up with PCP in about 1 week to make  sure your symptoms are stable. 2. GI office and out hematology/oncology office will also follow up on biopsy results once they are available we will call you with the findings and we will arrange proper follow up.     Increase activity slowly    Complete by:  As directed             Medication List    STOP taking these medications        aspirin EC 81 MG tablet      TAKE these medications        atorvastatin 20 MG tablet  Commonly known as:  LIPITOR  Take 20 mg by mouth daily.     chlorthalidone 25 MG tablet  Commonly known as:  HYGROTON  Take 25 mg by mouth daily.     glipiZIDE 10 MG tablet  Commonly known as:  GLUCOTROL  Take 10 mg by mouth 2 (two) times daily before a meal.     insulin glargine 100 UNIT/ML injection  Commonly  known as:  LANTUS  Inject 40 Units into the skin at bedtime.     lisinopril 40 MG tablet  Commonly known as:  PRINIVIL,ZESTRIL  Take 40 mg by mouth at bedtime.     metFORMIN 1000 MG tablet  Commonly known as:  GLUCOPHAGE  Take 1,000 mg by mouth 2 (two) times daily with a meal.     pantoprazole 40 MG tablet  Commonly known as:  PROTONIX  Take 1 tablet (40 mg total) by mouth daily.           Follow-up Information    Follow up with Beryle Beams, MD.   Specialty:  Gastroenterology   Why:  Follow up appt after recent hospitalization   Contact information:   [place], [place] [address] [phone]        The results of significant diagnostics from this hospitalization (including imaging, microbiology, ancillary and laboratory) are listed below for reference.    Significant Diagnostic Studies: Ct Abdomen Pelvis Wo Contrast  08/18/2014   CLINICAL DATA:  Chronic fatigue and cough. Feeling tired for the past 3-4 weeks. Positive hemoccult. Decreased hemoglobin. Initial encounter.  EXAM: CT ABDOMEN AND PELVIS WITHOUT CONTRAST  TECHNIQUE: Multidetector CT imaging of the abdomen and pelvis was performed following  the standard protocol without IV contrast.  COMPARISON:  None.  FINDINGS: The visualized lung bases are clear.  Multiple large masses are seen within the liver, bulky in appearance. The largest of these measures perhaps 5.7 cm, though all of the masses are relatively large. This is unusual for a primary hepatic malignancy, though it remains a possibility. Metastatic disease is a concern. The spleen is unremarkable.  Stones are noted within the gallbladder; the gallbladder is otherwise unremarkable in appearance. The pancreas and adrenal glands are within normal limits.  There appears to be a focal filling defect within the proximal jejunum, measuring approximately 2.5 x 1.7 cm, with surrounding contrast. There is mild adjacent jejunal wall thickening. Malignancy cannot be excluded. Capsule endoscopy could be considered for further evaluation, as deemed clinically appropriate.  Mild nonspecific perinephric stranding is noted bilaterally. The kidneys are otherwise unremarkable. There is no evidence of hydronephrosis. No renal or ureteral stones are seen.  No free fluid is identified. The small bowel is unremarkable in appearance. The stomach is within normal limits. No acute vascular abnormalities are seen.  The appendix is normal in caliber, without evidence of appendicitis. Scattered diverticulosis is noted along the descending and proximal sigmoid colon, without evidence of diverticulitis. The colon is otherwise unremarkable.  The bladder is decompressed and not well assessed. The prostate is enlarged, measuring 5.3 cm in transverse dimension. No inguinal lymphadenopathy is seen.  No acute osseous abnormalities are identified.  IMPRESSION: 1. Multiple large masses noted within the liver, bulky in appearance. These measure up to 5.7 cm in size; all of the masses are relatively large. This is unusual for primary hepatic malignancy, though it remains a possibility. Metastatic disease is a concern. Would correlate with  LFTs; these lesions may be the most amenable to biopsy. 2. Focal filling defect within the proximal jejunum, measuring 2.5 x 1.7 cm, with surrounding contrast. Mild adjacent jejunal wall thickening. Malignancy cannot be excluded. Capsule endoscopy could be considered for further evaluation, as deemed clinically appropriate. 3. Scattered diverticulosis along the descending and proximal sigmoid colon, without evidence of diverticulitis. 4. Enlarged prostate noted. 5. Mild cholelithiasis noted; gallbladder otherwise unremarkable.   Electronically Signed   By: Francoise Schaumann.D.  On: 08/18/2014 01:16    Microbiology: Recent Results (from the past 240 hour(s))  MRSA PCR Screening     Status: None   Collection Time: 08/18/14 12:45 AM  Result Value Ref Range Status   MRSA by PCR NEGATIVE NEGATIVE Final    Comment:        The GeneXpert MRSA Assay (FDA approved for NASAL specimens only), is one component of a comprehensive MRSA colonization surveillance program. It is not intended to diagnose MRSA infection nor to guide or monitor treatment for MRSA infections.      Labs: Basic Metabolic Panel:  Recent Labs Lab 08/17/14 1824 08/18/14 0703 08/19/14 0410  NA 135 137 142  K 4.5 4.3 4.5  CL 103 103 108  CO2 23 25 24   GLUCOSE 158* 64* 72  BUN 34* 33* 22  CREATININE 1.74* 1.54* 1.26  CALCIUM 9.0 8.4 8.7   Liver Function Tests:  Recent Labs Lab 08/17/14 1824 08/18/14 0703 08/19/14 0410  AST 35 30 32  ALT 21 17 17   ALKPHOS 96 81 82  BILITOT 0.5 0.6 0.8  PROT 7.8 6.5 6.3  ALBUMIN 3.4* 2.8* 2.8*   No results for input(s): LIPASE, AMYLASE in the last 168 hours. No results for input(s): AMMONIA in the last 168 hours. CBC:  Recent Labs Lab 08/17/14 1824 08/17/14 2323 08/18/14 0703 08/18/14 1559 08/18/14 2241 08/19/14 0415  WBC 13.1* 10.8* 9.3 12.0* 9.5 10.9*  NEUTROABS 10.2* 7.7 6.2  --  6.8 7.8*  HGB 7.6* 6.5* 7.1* 8.7* 8.4* 8.4*  HCT 25.6* 21.3* 22.6* 28.0* 26.8*  27.2*  MCV 76.6* 76.1* 77.9* 80.0 79.8 80.5  PLT 439* 365 372 384 342 362   Cardiac Enzymes: No results for input(s): CKTOTAL, CKMB, CKMBINDEX, TROPONINI in the last 168 hours. BNP: BNP (last 3 results) No results for input(s): BNP in the last 8760 hours.  ProBNP (last 3 results) No results for input(s): PROBNP in the last 8760 hours.  CBG:  Recent Labs Lab 08/18/14 0921 08/18/14 1027 08/18/14 1409 08/18/14 1612 08/18/14 1807  GLUCAP 57* 121* 104* 69* 144*    Time coordinating discharge: Over 30 minutes

## 2014-08-19 NOTE — Discharge Instructions (Signed)
Fecal Occult Blood Test This is a test done on a stool specimen to screen for gastrointestinal bleeding, which may be an indicator of colon cancer Is is usually done as part of a routine examination, annually, after age 71 or as directed by your caregiver. The fecal occult blood test (FOBT) checks for blood in your stool. Normally, there will not be enough blood lost through the gastrointestinal tract to turn an FOBT positive or for you to notice it visually in the form of bloody or dark, tarry stools. Any significant amount of blood being passed should be investigated.  A positive FOBT will tell your caregiver that you have bleeding occurring somewhere in your gastrointestinal tract. This blood loss could be due to ulcers, diverticulosis, bleeding polyps, inflammatory bowel disease, hemorrhoids, from swallowed blood due to bleeding gums or nosebleeds, or it could be due to benign or cancerous tumors. Anything that protrudes into the lumen (the empty space in the intestine), like a polyp or tumor, and is rubbed against by the fecal waste as it passes through has the potential to eventually bleed intermittently. Often this small amount of blood is the first, and sometimes the only, symptom of early colon cancer, making the FOBT a valuable screening tool. PREPARATION FOR TEST  You should not eat red meat within three days before testing. Other substances that could cause a false positive test result include fish, turnips, horseradish, and drugs such as colchicines and oxidizing drugs (for example, iodine and boric acid). Be sure to carefully follow your caregiver's instructions. With FOBT, your caregiver or laboratory will give you one or more test "cards." You collect a separate sample from three different stools, usually on consecutive days. Each stool sample should be collected into a clean container and should not be contaminated with urine or water. The slide is labeled with your name and the date; then,  with an applicator stick, you apply a thin smear of stool onto each filter paper square/window contained on the card. Allow the filter paper to dry. Once it is dry, it is stable. Usually you will collect all of the consecutive samples, and then return all of them to your caregiver or laboratory at the same time, sometimes by mailing them. There are also over the counter tests which are dropped in your toilet. NORMAL FINDINGS   No occult blood within the stool.  The FOBT test is normally negative. A positive indicates either blood in the stool or an interfering substance. Multiple samples are done to: 1) catch intermittent bleeding; and 2) help rule out false positives. Ranges for normal findings may vary among different laboratories and hospitals. You should always check with your doctor after having lab work or other tests done to discuss the meaning of your test results and whether your values are considered within normal limits. MEANING OF TEST  Your caregiver will go over the test results with you and discuss the importance and meaning of your results, as well as treatment options and the need for additional tests if necessary. OBTAINING THE TEST RESULTS  It is your responsibility to obtain your test results. Ask the lab or department performing the test when and how you will get your results. Document Released: 06/13/2004 Document Revised: 08/11/2011 Document Reviewed: 04/28/2008 [facility] Patient Information 2015 [place], [place]. This information is not intended to replace advice given to you by your health care provider. Make sure you discuss any questions you have with your health care provider.

## 2014-08-20 LAB — GLUCOSE, CAPILLARY: Glucose-Capillary: 136 mg/dL — ABNORMAL HIGH (ref 70–99)

## 2014-08-21 ENCOUNTER — Encounter: Payer: Self-pay | Admitting: *Deleted

## 2014-08-21 ENCOUNTER — Encounter (HOSPITAL_COMMUNITY): Payer: Self-pay | Admitting: Gastroenterology

## 2014-08-21 ENCOUNTER — Telehealth: Payer: Self-pay | Admitting: Hematology

## 2014-08-21 NOTE — Progress Notes (Signed)
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 Dr. Burr Medico will see him on 08/23/14 at 2:45 pm (with Ned Card, NP) Pathology from procedure on 08/18/14 is still pending. Will have HIM call patient with appointment.

## 2014-08-21 NOTE — Telephone Encounter (Signed)
Pt is aware of np appt on 08/23/14@2 :00.

## 2014-08-21 NOTE — ED Provider Notes (Signed)
CSN: [id number]     Arrival date & time 08/17/14  1739 History   First MD Initiated Contact with Patient 08/17/14 2030     Chief Complaint  Patient presents with  . Abnormal Lab     (Consider location/radiation/quality/duration/timing/severity/associated sxs/prior Treatment) HPI Patient presents to the emergency department with abnormal lab values.  Patient's hemoglobin was found to be low at his doctor's office and was told to come to the emergency department.  Patient states he has had some chronic fatigue over the last several months with cough is been feeling very tired over the last 3-4 weeks.  He denies chest pain, shortness of breath, nausea, vomiting, diarrhea, bloody stool, hematemesis, dysuria, back pain, fever or syncope.  The patient states that nothing seems make his condition, better with activity makes him feel more fatigued Past Medical History  Diagnosis Date  . Diabetes mellitus without complication   . Hypertension    Past Surgical History  Procedure Laterality Date  . Enteroscopy N/A 08/18/2014    Procedure: ENTEROSCOPY;  Surgeon: Carol Ada, MD;  Location: [facility];  Service: Endoscopy;  Laterality: N/A;   Family History  Problem Relation Age of Onset  . Heart failure Mother   . Cancer Father   . Cancer Brother    History  Substance Use Topics  . Smoking status: Never Smoker   . Smokeless tobacco: Not on file  . Alcohol Use: No    Review of Systems  All other systems negative except as documented in the HPI. All pertinent positives and negatives as reviewed in the HPI.  Allergies  Penicillins  Home Medications   Prior to Admission medications   Medication Sig Start Date End Date Taking? Authorizing Provider  atorvastatin (LIPITOR) 20 MG tablet Take 20 mg by mouth daily.   Yes Historical Provider, MD  chlorthalidone (HYGROTON) 25 MG tablet Take 25 mg by mouth daily.   Yes Historical Provider, MD  glipiZIDE (GLUCOTROL) 10 MG tablet Take 10 mg by  mouth 2 (two) times daily before a meal.   Yes Historical Provider, MD  insulin glargine (LANTUS) 100 UNIT/ML injection Inject 40 Units into the skin at bedtime.   Yes Historical Provider, MD  lisinopril (PRINIVIL,ZESTRIL) 40 MG tablet Take 40 mg by mouth at bedtime.   Yes Historical Provider, MD  metFORMIN (GLUCOPHAGE) 1000 MG tablet Take 1,000 mg by mouth 2 (two) times daily with a meal.   Yes Historical Provider, MD  pantoprazole (PROTONIX) 40 MG tablet Take 1 tablet (40 mg total) by mouth daily. 08/19/14   Robbie Lis, MD   BP 120/62 mmHg  Pulse 72  Temp(Src) 98.3 F (36.8 C) (Oral)  Resp 14  Ht 5\' 6"  (1.676 m)  Wt 213 lb 10 oz (96.9 kg)  BMI 34.50 kg/m2  SpO2 99% Physical Exam  Constitutional: He is oriented to person, place, and time. He appears well-developed and well-nourished. No distress.  HENT:  Head: Normocephalic and atraumatic.  Mouth/Throat: Oropharynx is clear and moist.  Eyes: Pupils are equal, round, and reactive to light.  Neck: Normal range of motion. Neck supple.  Cardiovascular: Normal rate, regular rhythm and normal heart sounds.  Exam reveals no gallop and no friction rub.   No murmur heard. Pulmonary/Chest: Effort normal and breath sounds normal. No respiratory distress.  Abdominal: Soft. Bowel sounds are normal. He exhibits no distension. There is no tenderness.  Musculoskeletal: He exhibits no edema.  Neurological: He is alert and oriented to person, place, and time.  He exhibits normal muscle tone. Coordination normal.  Skin: Skin is warm and dry. No rash noted. No erythema.  Nursing note and vitals reviewed.   ED Course  Procedures (including critical care time) Labs Review Labs Reviewed  COMPREHENSIVE METABOLIC PANEL - Abnormal; Notable for the following:    Glucose, Bld 158 (*)    BUN 34 (*)    Creatinine, Ser 1.74 (*)    Albumin 3.4 (*)    GFR calc non Af Amer 38 (*)    GFR calc Af Amer 44 (*)    All other components within normal limits   CBC WITH DIFFERENTIAL/PLATELET - Abnormal; Notable for the following:    WBC 13.1 (*)    RBC 3.34 (*)    Hemoglobin 7.6 (*)    HCT 25.6 (*)    MCV 76.6 (*)    MCH 22.8 (*)    MCHC 29.7 (*)    RDW 16.6 (*)    Platelets 439 (*)    Neutrophils Relative % 78 (*)    Neutro Abs 10.2 (*)    All other components within normal limits  COMPREHENSIVE METABOLIC PANEL - Abnormal; Notable for the following:    Glucose, Bld 64 (*)    BUN 33 (*)    Creatinine, Ser 1.54 (*)    Albumin 2.8 (*)    GFR calc non Af Amer 44 (*)    GFR calc Af Amer 51 (*)    All other components within normal limits  CBC WITH DIFFERENTIAL/PLATELET - Abnormal; Notable for the following:    WBC 10.8 (*)    RBC 2.80 (*)    Hemoglobin 6.5 (*)    HCT 21.3 (*)    MCV 76.1 (*)    MCH 23.2 (*)    RDW 16.6 (*)    All other components within normal limits  CBC WITH DIFFERENTIAL/PLATELET - Abnormal; Notable for the following:    RBC 2.90 (*)    Hemoglobin 7.1 (*)    HCT 22.6 (*)    MCV 77.9 (*)    MCH 24.5 (*)    RDW 17.2 (*)    All other components within normal limits  HEMOGLOBIN A1C - Abnormal; Notable for the following:    Hgb A1c MFr Bld 7.7 (*)    All other components within normal limits  CBC WITH DIFFERENTIAL/PLATELET - Abnormal; Notable for the following:    RBC 3.36 (*)    Hemoglobin 8.4 (*)    HCT 26.8 (*)    MCH 25.0 (*)    RDW 17.4 (*)    All other components within normal limits  GLUCOSE, CAPILLARY - Abnormal; Notable for the following:    Glucose-Capillary 57 (*)    All other components within normal limits  GLUCOSE, CAPILLARY - Abnormal; Notable for the following:    Glucose-Capillary 121 (*)    All other components within normal limits  GLUCOSE, CAPILLARY - Abnormal; Notable for the following:    Glucose-Capillary 104 (*)    All other components within normal limits  CBC - Abnormal; Notable for the following:    WBC 12.0 (*)    RBC 3.50 (*)    Hemoglobin 8.7 (*)    HCT 28.0 (*)    MCH  24.9 (*)    RDW 17.5 (*)    All other components within normal limits  COMPREHENSIVE METABOLIC PANEL - Abnormal; Notable for the following:    Albumin 2.8 (*)    GFR calc non Af Amer 56 (*)  GFR calc Af Amer 65 (*)    All other components within normal limits  GLUCOSE, CAPILLARY - Abnormal; Notable for the following:    Glucose-Capillary 144 (*)    All other components within normal limits  CBC WITH DIFFERENTIAL/PLATELET - Abnormal; Notable for the following:    WBC 10.9 (*)    RBC 3.38 (*)    Hemoglobin 8.4 (*)    HCT 27.2 (*)    MCH 24.9 (*)    RDW 17.5 (*)    Neutro Abs 7.8 (*)    All other components within normal limits  GLUCOSE, CAPILLARY - Abnormal; Notable for the following:    Glucose-Capillary 69 (*)    All other components within normal limits  GLUCOSE, CAPILLARY - Abnormal; Notable for the following:    Glucose-Capillary 131 (*)    All other components within normal limits  GLUCOSE, CAPILLARY - Abnormal; Notable for the following:    Glucose-Capillary 105 (*)    All other components within normal limits  GLUCOSE, CAPILLARY - Abnormal; Notable for the following:    Glucose-Capillary 136 (*)    All other components within normal limits  POC OCCULT BLOOD, ED - Abnormal; Notable for the following:    Fecal Occult Bld POSITIVE (*)    All other components within normal limits  CBG MONITORING, ED - Abnormal; Notable for the following:    Glucose-Capillary 109 (*)    All other components within normal limits  MRSA PCR SCREENING  GLUCOSE, CAPILLARY  TYPE AND SCREEN  ABO/RH  PREPARE RBC (CROSSMATCH)  PREPARE RBC (CROSSMATCH)  SURGICAL PATHOLOGY    Imaging Review No results found.   EKG Interpretation None      MDM   Final diagnoses:  GIB (gastrointestinal bleeding)  Gastrointestinal hemorrhage, unspecified gastritis, unspecified gastrointestinal hemorrhage type    Patient be admitted to the hospital for further evaluation and care with the [facility] who will come see the patient for admission  Dalia Heading, PA-C 08/21/14 [place], MD 08/21/14 1558

## 2014-08-23 ENCOUNTER — Encounter: Payer: Self-pay | Admitting: Nurse Practitioner

## 2014-08-23 ENCOUNTER — Ambulatory Visit (HOSPITAL_BASED_OUTPATIENT_CLINIC_OR_DEPARTMENT_OTHER): Payer: Medicare Other | Admitting: Nurse Practitioner

## 2014-08-23 ENCOUNTER — Other Ambulatory Visit: Payer: Self-pay | Admitting: Internal Medicine

## 2014-08-23 ENCOUNTER — Encounter: Payer: Self-pay | Admitting: Oncology

## 2014-08-23 ENCOUNTER — Ambulatory Visit: Payer: Medicare Other

## 2014-08-23 VITALS — BP 133/66 | HR 88 | Temp 98.4°F | Resp 18 | Ht 66.0 in | Wt 215.7 lb

## 2014-08-23 DIAGNOSIS — C171 Malignant neoplasm of jejunum: Secondary | ICD-10-CM

## 2014-08-23 DIAGNOSIS — R630 Anorexia: Secondary | ICD-10-CM

## 2014-08-23 DIAGNOSIS — R05 Cough: Secondary | ICD-10-CM

## 2014-08-23 DIAGNOSIS — E119 Type 2 diabetes mellitus without complications: Secondary | ICD-10-CM

## 2014-08-23 DIAGNOSIS — C179 Malignant neoplasm of small intestine, unspecified: Secondary | ICD-10-CM

## 2014-08-23 DIAGNOSIS — N182 Chronic kidney disease, stage 2 (mild): Secondary | ICD-10-CM | POA: Diagnosis not present

## 2014-08-23 DIAGNOSIS — R059 Cough, unspecified: Secondary | ICD-10-CM

## 2014-08-23 DIAGNOSIS — I10 Essential (primary) hypertension: Secondary | ICD-10-CM

## 2014-08-23 DIAGNOSIS — C787 Secondary malignant neoplasm of liver and intrahepatic bile duct: Secondary | ICD-10-CM | POA: Diagnosis not present

## 2014-08-23 DIAGNOSIS — D509 Iron deficiency anemia, unspecified: Secondary | ICD-10-CM | POA: Diagnosis not present

## 2014-08-23 DIAGNOSIS — R634 Abnormal weight loss: Secondary | ICD-10-CM

## 2014-08-23 NOTE — [facility] Oncology Navigator Note (Signed)
Met with patient and wife during new patient visit. Explained the role of the GI Nurse Navigator and provided New Patient Packet with information on: 1.  Small bowel cancer 2. Support groups 3. Advanced Directives 4. Fall Safety Plan Answered questions, reviewed current treatment plan using TEACH back and provided emotional support. Provided copy of current treatment plan.  Merceda Elks, RN, BSN GI Oncology [place]

## 2014-08-23 NOTE — Progress Notes (Signed)
Checked in new pt with no financial concerns. °

## 2014-08-23 NOTE — Progress Notes (Addendum)
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 male is here because of a recent diagnosis of small bowel cancer. He presented to his PCP on 08/17/2014 with complaints of feeling weak and a one month history of a cough. He was found to be anemic and was referred to the emergency department. Hemoglobin was found to be 7.6, MCV 76.6; creatinine was elevated at 1.74. Stool was Hemoccult positive.  CT abdomen/pelvis without contrast on 08/17/2014 showed multiple large masses within the liver, bulky in appearance. The largest measured approximately 5.7 cm. There appeared to be a focal filling defect within the proximal jejunum measuring approximately 2.5 x 1.7 cm. There was mild adjacent jejunal wall thickening. The lung bases were clear.  On 08/18/2014 he underwent a small bowel enteroscopy with biopsy by Dr. Benson Norway. The esophagus and gastric lumen were normal. At the proximal jejunum there was evidence of abnormal mucosa and friability. The pediatric colonoscope was not able to traverse the area. Biopsies were obtained. An ultraslim colonoscope was then utilized. This colonoscope was able to traverse the area of stenosis which measured approximately 3 cm in length. 50% of the lumen was ulcerated. 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 MMR stains are pending.  He was discharged home on 08/19/2014.  MEDICAL HISTORY:  Past Medical History  Diagnosis Date  . Diabetes mellitus without complication   . Hypertension   . Small bowel cancer 08/18/2014    SURGICAL HISTORY: Past Surgical History  Procedure Laterality Date  . Enteroscopy N/A 08/18/2014    Procedure:  ENTEROSCOPY;  Surgeon: Carol Ada, MD;  Location: [facility];  Service: Endoscopy;  Laterality: N/A;    SOCIAL HISTORY: History   Social History  . Marital Status: Married    Spouse Name: N/A  . Number of Children: 2  . Years of Education: N/A   Occupational History  .  retired Ambulance person    Social History Main Topics  . Smoking status: Never Smoker   . Smokeless tobacco: Not on file  . Alcohol Use: No  . Drug Use: Not on file  . Sexual Activity: Not on file   Other Topics Concern  . Not on file   Social History Narrative  He lives in [place]. He is married. He has 2 children both reported to be in good health. No tobacco or alcohol use. He is a retired Ambulance person.  FAMILY HISTORY: Family History  Problem Relation Age of Onset  . Heart failure, Alzheimer's  Mother   . Lung cancer Father   . Kidney cancer Brother     ALLERGIES:  is allergic to penicillins.  MEDICATIONS:  Current Outpatient Prescriptions  Medication Sig Dispense Refill  . atorvastatin (LIPITOR) 20 MG tablet Take 20 mg by mouth daily.    Marland Kitchen glipiZIDE (GLUCOTROL) 10 MG tablet Take 10 mg by mouth 2 (two) times daily before a meal.    . insulin glargine (LANTUS) 100 UNIT/ML injection Inject 40 Units into the skin at bedtime.    . pantoprazole (PROTONIX) 40 MG tablet Take 1 tablet (40 mg total) by mouth daily. 30 tablet 0  . chlorthalidone (HYGROTON) 25 MG tablet Take 25 mg by mouth daily.    Marland Kitchen lisinopril (  PRINIVIL,ZESTRIL) 40 MG tablet Take 40 mg by mouth at bedtime.    . metFORMIN (GLUCOPHAGE) 1000 MG tablet Take 1,000 mg by mouth 2 (two) times daily with a meal.         REVIEW OF SYSTEMS:   Constitutional: Denies fevers, chills or abnormal night sweats; poor appetite. He estimates 20 pound weight loss over the past month Eyes: Denies blurriness of vision, double vision or watery eyes Ears, nose, mouth, throat, and face: Denies mucositis or sore throat Respiratory: One month history of  a nonproductive cough;  no shortness of breath Cardiovascular: Denies palpitation, chest discomfort or lower extremity swelling Gastrointestinal:  Denies nausea, heartburn. No dysphagia. He has noted less frequent bowel movements over the past 2 weeks. Does not characterize this as constipation. Skin: Denies abnormal skin rashes Lymphatics: Denies new lymphadenopathy or easy bruising Neurological: Denies numbness, tingling. No extremity weakness Behavioral/Psych: Mood is stable, no new changes  All other systems were reviewed with the patient and are negative.  PHYSICAL EXAMINATION: ECOG PERFORMANCE STATUS: 1 - Symptomatic but completely ambulatory  Filed Vitals:   08/23/14 1442  BP: 133/66  Pulse: 88  Temp: 98.4 F (36.9 C)  Resp: 18   Filed Weights   08/23/14 1442  Weight: 215 lb 11.2 oz (97.841 kg)    GENERAL:alert, no distress and comfortable SKIN: skin color, texture, turgor are normal, no rashes or significant lesions EYES: normal, conjunctiva are pink and non-injected, sclera clear OROPHARYNX:no exudate, no erythema and lips, buccal mucosa, and tongue normal  NECK: supple, thyroid normal size, non-tender, without nodularity LYMPH:  no palpable lymphadenopathy in the cervical, axillary or inguinal regions LUNGS: clear to auscultation and percussion with normal breathing effort HEART: regular rate & rhythm and no murmurs and no lower extremity edema ABDOMEN:abdomen soft, non-tender and normal bowel sounds Musculoskeletal:no cyanosis of digits and no clubbing  PSYCH: alert & oriented x 3 with fluent speech NEURO: no focal motor/sensory deficits  LABORATORY DATA:  I have reviewed the data as listed Lab Results  Component Value Date   WBC 10.9* 08/19/2014   HGB 8.4* 08/19/2014   HCT 27.2* 08/19/2014   MCV 80.5 08/19/2014   PLT 362 08/19/2014    Recent Labs  08/17/14 1824 08/18/14 0703 08/19/14 0410  NA 135 137 142  K 4.5 4.3 4.5  CL 103 103 108  CO2 $Re'23 25 24   'Cvy$ GLUCOSE 158* 64* 72  BUN 34* 33* 22  CREATININE 1.74* 1.54* 1.26  CALCIUM 9.0 8.4 8.7  GFRNONAA 38* 44* 56*  GFRAA 44* 51* 65*  PROT 7.8 6.5 6.3  ALBUMIN 3.4* 2.8* 2.8*  AST 35 30 32  ALT $Re'21 17 17  'yDK$ ALKPHOS 96 81 82  BILITOT 0.5 0.6 0.8    RADIOGRAPHIC STUDIES: I have personally reviewed the radiological images as listed and agreed with the findings in the report. Ct Abdomen Pelvis Wo Contrast  08/18/2014   CLINICAL DATA:  Chronic fatigue and cough. Feeling tired for the past 3-4 weeks. Positive hemoccult. Decreased hemoglobin. Initial encounter.  EXAM: CT ABDOMEN AND PELVIS WITHOUT CONTRAST  TECHNIQUE: Multidetector CT imaging of the abdomen and pelvis was performed following the standard protocol without IV contrast.  COMPARISON:  None.  FINDINGS: The visualized lung bases are clear.  Multiple large masses are seen within the liver, bulky in appearance. The largest of these measures perhaps 5.7 cm, though all of the masses are relatively large. This is unusual for a primary hepatic malignancy, though it remains a  possibility. Metastatic disease is a concern. The spleen is unremarkable.  Stones are noted within the gallbladder; the gallbladder is otherwise unremarkable in appearance. The pancreas and adrenal glands are within normal limits.  There appears to be a focal filling defect within the proximal jejunum, measuring approximately 2.5 x 1.7 cm, with surrounding contrast. There is mild adjacent jejunal wall thickening. Malignancy cannot be excluded. Capsule endoscopy could be considered for further evaluation, as deemed clinically appropriate.  Mild nonspecific perinephric stranding is noted bilaterally. The kidneys are otherwise unremarkable. There is no evidence of hydronephrosis. No renal or ureteral stones are seen.  No free fluid is identified. The small bowel is unremarkable in appearance. The stomach is within normal limits. No acute vascular abnormalities are seen.  The appendix is  normal in caliber, without evidence of appendicitis. Scattered diverticulosis is noted along the descending and proximal sigmoid colon, without evidence of diverticulitis. The colon is otherwise unremarkable.  The bladder is decompressed and not well assessed. The prostate is enlarged, measuring 5.3 cm in transverse dimension. No inguinal lymphadenopathy is seen.  No acute osseous abnormalities are identified.  IMPRESSION: 1. Multiple large masses noted within the liver, bulky in appearance. These measure up to 5.7 cm in size; all of the masses are relatively large. This is unusual for primary hepatic malignancy, though it remains a possibility. Metastatic disease is a concern. Would correlate with LFTs; these lesions may be the most amenable to biopsy. 2. Focal filling defect within the proximal jejunum, measuring 2.5 x 1.7 cm, with surrounding contrast. Mild adjacent jejunal wall thickening. Malignancy cannot be excluded. Capsule endoscopy could be considered for further evaluation, as deemed clinically appropriate. 3. Scattered diverticulosis along the descending and proximal sigmoid colon, without evidence of diverticulitis. 4. Enlarged prostate noted. 5. Mild cholelithiasis noted; gallbladder otherwise unremarkable.   Electronically Signed   By: Garald Balding M.D.   On: 08/18/2014 01:16    ASSESSMENT & PLAN:  1. 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  CT abdomen/pelvis without contrast 08/17/2014 with multiple large masses within the liver, bulky in appearance. The largest measured approximately 5.7 cm. Focal filling defect within the proximal jejunum measuring approximately 2.5 x 1.7 cm. Mild adjacent jejunal wall thickening.  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  2. Microcytic anemia in the setting of heme-positive stool  status post red cell transfusion support 08/17/2014. 3. Nonproductive cough x one month. 4. Anorexia/weight loss secondary to #1. 5. Hypertension. 6. Diabetes. 7. 7. CKD stage III  Mr. Lepera has been diagnosed with metastatic small bowel cancer involving the liver. Dr. Burr Medico reviewed the diagnosis, prognosis and treatment options with Mr. Eddinger and his wife. They understand no therapy will be curative.  Dr. Burr Medico recommends initiation of chemotherapy on the FOLFOX regimen. The CAPOX regimen was also discussed. Potential toxicities were reviewed. Decision made to proceed with treatment on the FOLFOX regimen.  We are referring him for a staging PET scan. We are also referring him to interventional radiology at [facility] for Port-A-Cath placement.   Labs will be obtained to include a CBC, chemistry panel, CEA, PT/PTT and iron studies.  We will schedule him to attend a chemotherapy education class.  He will return for a follow-up visit in 2 weeks with plans to proceed with cycle 1 FOLFOX at that time. He will contact the office in the interim with any problems.  Patient seen with Dr Burr Medico.   Ned Card, NP 08/23/2014 4:11 PM   Orders Placed This Encounter  Procedures  . NM PET Image Initial (PI) Skull Base To Thigh    Standing Status: Future     Number of Occurrences:      Standing Expiration Date: 08/23/2015    Order Specific Question:  Reason for Exam (SYMPTOM  OR DIAGNOSIS REQUIRED)    Answer:  metastatic small bowel cancer    Order Specific Question:  Preferred imaging location?    Answer:  [facility], [place]  . IR Fluoro Guide CV Line Left    portacath    Standing Status: Future     Number of Occurrences:      Standing Expiration Date: 10/23/2015    Order Specific Question:  Reason for exam:    Answer:  small bowel cancer; portacath for chemo    Order Specific Question:  Preferred Imaging Location?    Answer:  [facility]  . CBC with Differential     Standing Status: Future     Number of Occurrences:      Standing Expiration Date: 08/23/2015  . Comprehensive metabolic panel (Cmet) - [facility]    Standing Status: Future     Number of Occurrences:      Standing Expiration Date: 08/23/2015  . Protime-INR    Standing Status: Future     Number of Occurrences:      Standing Expiration Date: 08/23/2015  . APTT    Standing Status: Future     Number of Occurrences:      Standing Expiration Date: 08/23/2015  . CEA    Standing Status: Future     Number of Occurrences:      Standing Expiration Date: 08/23/2015  . Ferritin    Standing Status: Future     Number of Occurrences:      Standing Expiration Date: 08/23/2015  . Iron and TIBC [facility]    Standing Status: Future     Number of Occurrences:      Standing Expiration Date: 08/23/2015  . Hold Tube, Blood Bank    Standing Status: Future     Number of Occurrences:      Standing Expiration Date: 08/23/2015        All questions were answered. The patient knows to call the clinic with any problems, questions or concerns. I spent counseling the patient face to face. The total time spent in the appointment was and more than 50% was on counseling.     Attending addendum I have seen the patient, examined him. I agree with the assessment and and plan and have edited the notes.   I have reviewed the CT scan findings and images with the patient and his wife in great details. The nature of the disease was discussed with them, unfortunately it is not curable at this stage. The goal of treatment is palliative. He has good performance status, however multiple comorbidities, including stage III chronic kidney disease disease, which will make the choice and dosage of chemotherapy limited and he may experience more side effects from chemotherapy due to his kidney failure.  Pan -PET scan for complete staging -His tumor MMR is still pending, we'll check KRAS/NRAS also -Chemotherapy class -Port placement by  IR -Tentatively start chemotherapy in 2 weeks, I'll see him on the first day of chemotherapy   Truitt Merle

## 2014-08-24 ENCOUNTER — Encounter: Payer: Self-pay | Admitting: Nurse Practitioner

## 2014-08-24 ENCOUNTER — Other Ambulatory Visit: Payer: Medicare Other

## 2014-08-24 ENCOUNTER — Telehealth: Payer: Self-pay | Admitting: *Deleted

## 2014-08-24 NOTE — Telephone Encounter (Signed)
Per staff message and POF I have scheduled appts. Advised scheduler of appts. JMW  

## 2014-08-25 ENCOUNTER — Other Ambulatory Visit: Payer: Self-pay | Admitting: Radiology

## 2014-08-25 ENCOUNTER — Other Ambulatory Visit: Payer: Self-pay | Admitting: Hematology

## 2014-08-25 DIAGNOSIS — C179 Malignant neoplasm of small intestine, unspecified: Secondary | ICD-10-CM

## 2014-08-28 ENCOUNTER — Other Ambulatory Visit: Payer: Self-pay | Admitting: Radiology

## 2014-08-28 ENCOUNTER — Encounter (HOSPITAL_COMMUNITY): Payer: Self-pay

## 2014-08-28 ENCOUNTER — Ambulatory Visit (HOSPITAL_COMMUNITY)
Admission: RE | Admit: 2014-08-28 | Discharge: 2014-08-28 | Disposition: A | Discharge status: Home / Self Care | Payer: Medicare Other | Source: Ambulatory Visit | Attending: Hematology | Admitting: Hematology

## 2014-08-28 ENCOUNTER — Ambulatory Visit (HOSPITAL_COMMUNITY)
Admission: RE | Admit: 2014-08-28 | Discharge: 2014-08-28 | Disposition: A | Discharge status: Home / Self Care | Payer: Medicare Other | Source: Ambulatory Visit | Attending: Interventional Radiology | Admitting: Interventional Radiology

## 2014-08-28 ENCOUNTER — Other Ambulatory Visit: Payer: Self-pay | Admitting: Nurse Practitioner

## 2014-08-28 ENCOUNTER — Other Ambulatory Visit: Payer: Medicare Other

## 2014-08-28 DIAGNOSIS — C179 Malignant neoplasm of small intestine, unspecified: Secondary | ICD-10-CM | POA: Insufficient documentation

## 2014-08-28 DIAGNOSIS — I10 Essential (primary) hypertension: Secondary | ICD-10-CM | POA: Insufficient documentation

## 2014-08-28 DIAGNOSIS — Z79899 Other long term (current) drug therapy: Secondary | ICD-10-CM | POA: Insufficient documentation

## 2014-08-28 DIAGNOSIS — Z794 Long term (current) use of insulin: Secondary | ICD-10-CM | POA: Diagnosis not present

## 2014-08-28 DIAGNOSIS — D649 Anemia, unspecified: Secondary | ICD-10-CM | POA: Insufficient documentation

## 2014-08-28 DIAGNOSIS — E119 Type 2 diabetes mellitus without complications: Secondary | ICD-10-CM | POA: Insufficient documentation

## 2014-08-28 DIAGNOSIS — C787 Secondary malignant neoplasm of liver and intrahepatic bile duct: Secondary | ICD-10-CM | POA: Insufficient documentation

## 2014-08-28 LAB — CBC WITH DIFFERENTIAL/PLATELET
Basophils Absolute: 0 10*3/uL (ref 0.0–0.1)
Basophils Relative: 0 % (ref 0–1)
EOS ABS: 0.2 10*3/uL (ref 0.0–0.7)
Eosinophils Relative: 2 % (ref 0–5)
HCT: 29.6 % — ABNORMAL LOW (ref 39.0–52.0)
Hemoglobin: 9.2 g/dL — ABNORMAL LOW (ref 13.0–17.0)
LYMPHS ABS: 1.9 10*3/uL (ref 0.7–4.0)
Lymphocytes Relative: 15 % (ref 12–46)
MCH: 24.3 pg — ABNORMAL LOW (ref 26.0–34.0)
MCHC: 31.1 g/dL (ref 30.0–36.0)
MCV: 78.1 fL (ref 78.0–100.0)
MONOS PCT: 8 % (ref 3–12)
Monocytes Absolute: 1 10*3/uL (ref 0.1–1.0)
NEUTROS ABS: 9.7 10*3/uL — AB (ref 1.7–7.7)
NEUTROS PCT: 75 % (ref 43–77)
PLATELETS: 370 10*3/uL (ref 150–400)
RBC: 3.79 MIL/uL — ABNORMAL LOW (ref 4.22–5.81)
RDW: 18.2 % — ABNORMAL HIGH (ref 11.5–15.5)
WBC: 12.8 10*3/uL — AB (ref 4.0–10.5)

## 2014-08-28 LAB — APTT: APTT: 27 s (ref 24–37)

## 2014-08-28 LAB — PROTIME-INR
INR: 1.06 (ref 0.00–1.49)
PROTHROMBIN TIME: 13.9 s (ref 11.6–15.2)

## 2014-08-28 LAB — GLUCOSE, CAPILLARY: Glucose-Capillary: 118 mg/dL — ABNORMAL HIGH (ref 70–99)

## 2014-08-28 MED ORDER — LIDOCAINE HCL (PF) 1 % IJ SOLN
INTRAMUSCULAR | Status: AC
  Filled 2014-08-28: qty 10

## 2014-08-28 MED ORDER — SODIUM CHLORIDE 0.9 % IV SOLN
INTRAVENOUS | Status: DC
  Administered 2014-08-28: 12:00:00 via INTRAVENOUS

## 2014-08-28 MED ORDER — MIDAZOLAM HCL 2 MG/2ML IJ SOLN
INTRAMUSCULAR | Status: AC | PRN
  Administered 2014-08-28 (×2): 1 mg via INTRAVENOUS
  Administered 2014-08-28: 0.5 mg via INTRAVENOUS

## 2014-08-28 MED ORDER — FENTANYL CITRATE 0.05 MG/ML IJ SOLN
INTRAMUSCULAR | Status: AC | PRN
  Administered 2014-08-28: 25 ug via INTRAVENOUS
  Administered 2014-08-28: 50 ug via INTRAVENOUS
  Administered 2014-08-28: 25 ug via INTRAVENOUS

## 2014-08-28 MED ORDER — HEPARIN SOD (PORK) LOCK FLUSH 100 UNIT/ML IV SOLN
INTRAVENOUS | Status: AC
  Filled 2014-08-28: qty 5

## 2014-08-28 MED ORDER — FENTANYL CITRATE 0.05 MG/ML IJ SOLN
INTRAMUSCULAR | Status: AC
  Filled 2014-08-28: qty 4

## 2014-08-28 MED ORDER — GELATIN ABSORBABLE 12-7 MM EX MISC
CUTANEOUS | Status: AC
  Filled 2014-08-28: qty 1

## 2014-08-28 MED ORDER — SODIUM CHLORIDE 0.9 % IV SOLN
INTRAVENOUS | Status: AC | PRN
  Administered 2014-08-28: 50 mL/h via INTRAVENOUS

## 2014-08-28 MED ORDER — VANCOMYCIN HCL IN DEXTROSE 1-5 GM/200ML-% IV SOLN
1000.0000 mg | Freq: Once | INTRAVENOUS | Status: AC
  Administered 2014-08-28: 1000 mg via INTRAVENOUS
  Filled 2014-08-28: qty 200

## 2014-08-28 MED ORDER — MIDAZOLAM HCL 2 MG/2ML IJ SOLN
INTRAMUSCULAR | Status: DC
Start: 2014-08-28 — End: 2014-08-29
  Filled 2014-08-28: qty 4

## 2014-08-28 MED ORDER — LIDOCAINE HCL (PF) 1 % IJ SOLN
INTRAMUSCULAR | Status: AC
  Filled 2014-08-28: qty 30

## 2014-08-28 NOTE — Consult Note (Signed)
Chief Complaint: Newly dx small bowel cancer Liver involvement  Referring Physician(s): Feng,Yan  History of Present Illness: Tyler Evans is a 69 y.o. male   Pt with several weeks of fatigue and weakness; abdominal pain PCP work up reveals anemia; 08/21/14 CT shows liver lesions and focal filling defect in jejunum. Enteroscopy with Dr Benson Norway confirms jejunal adenocarcinoma by bx [facility] a Cath placement for chemo and  Liver lesion biopsy for tissue diagnosis   Past Medical History  Diagnosis Date  . Diabetes mellitus without complication   . Hypertension   . Small bowel cancer 08/18/2014    Past Surgical History  Procedure Laterality Date  . Enteroscopy N/A 08/18/2014    Procedure: ENTEROSCOPY;  Surgeon: Carol Ada, MD;  Location: [facility];  Service: Endoscopy;  Laterality: N/A;    Allergies: Penicillins  Medications: Prior to Admission medications   Medication Sig Start Date End Date Taking? Authorizing Provider  atorvastatin (LIPITOR) 20 MG tablet Take 20 mg by mouth daily.   Yes Historical Provider, MD  glipiZIDE (GLUCOTROL) 10 MG tablet Take 10 mg by mouth 2 (two) times daily before a meal.   Yes Historical Provider, MD  insulin glargine (LANTUS) 100 UNIT/ML injection Inject 40 Units into the skin at bedtime.   Yes Historical Provider, MD  lisinopril (PRINIVIL,ZESTRIL) 40 MG tablet Take 40 mg by mouth at bedtime.   Yes Historical Provider, MD  metFORMIN (GLUCOPHAGE) 1000 MG tablet Take 1,000 mg by mouth 2 (two) times daily with a meal.   Yes Historical Provider, MD  pantoprazole (PROTONIX) 40 MG tablet Take 1 tablet (40 mg total) by mouth daily. 08/19/14  Yes Robbie Lis, MD  chlorthalidone (HYGROTON) 25 MG tablet Take 25 mg by mouth daily.    Historical Provider, MD     Family History  Problem Relation Age of Onset  . Heart failure Mother   . Cancer Father   . Cancer Brother     History   Social History    . Marital Status: Married    Spouse Name: N/A  . Number of Children: N/A  . Years of Education: N/A   Social History Main Topics  . Smoking status: Never Smoker   . Smokeless tobacco: Not on file  . Alcohol Use: No  . Drug Use: Not on file  . Sexual Activity: Not on file   Other Topics Concern  . None   Social History Narrative     Review of Systems: A 12 point ROS discussed and pertinent positives are indicated in the HPI above.  All other systems are negative.  Review of Systems  Constitutional: Positive for fatigue. Negative for activity change.  Respiratory: Positive for cough. Negative for chest tightness.   Cardiovascular: Negative for chest pain.  Gastrointestinal: Positive for abdominal pain and constipation.  Musculoskeletal: Negative for back pain.  Psychiatric/Behavioral: Negative for behavioral problems and confusion.     Vital Signs: T: 98.2; HR: 92; RR: 18; BP: 115/68; O2sat: 99%  Physical Exam  Constitutional: He is oriented to person, place, and time. He appears well-developed and well-nourished.  Cardiovascular: Normal rate, regular rhythm and normal heart sounds.   No murmur heard. Pulmonary/Chest: Effort normal and breath sounds normal. No respiratory distress.  Abdominal: Soft. Bowel sounds are normal. There is tenderness.  Musculoskeletal: Normal range of motion.  Neurological: He is alert and oriented to person, place, and time.  Skin: Skin is warm and dry.  Psychiatric: He has a normal mood and affect. His behavior is normal. Judgment and thought content normal.  Nursing note and vitals reviewed.   Mallampati Score:  MD Evaluation Airway: WNL Heart: WNL Abdomen: WNL Chest/ Lungs: WNL ASA  Classification: 3 Mallampati/Airway Score: One  Imaging: Ct Abdomen Pelvis Wo Contrast  08/18/2014   CLINICAL DATA:  Chronic fatigue and cough. Feeling tired for the past 3-4 weeks. Positive hemoccult. Decreased hemoglobin. Initial encounter.  EXAM:  CT ABDOMEN AND PELVIS WITHOUT CONTRAST  TECHNIQUE: Multidetector CT imaging of the abdomen and pelvis was performed following the standard protocol without IV contrast.  COMPARISON:  None.  FINDINGS: The visualized lung bases are clear.  Multiple large masses are seen within the liver, bulky in appearance. The largest of these measures perhaps 5.7 cm, though all of the masses are relatively large. This is unusual for a primary hepatic malignancy, though it remains a possibility. Metastatic disease is a concern. The spleen is unremarkable.  Stones are noted within the gallbladder; the gallbladder is otherwise unremarkable in appearance. The pancreas and adrenal glands are within normal limits.  There appears to be a focal filling defect within the proximal jejunum, measuring approximately 2.5 x 1.7 cm, with surrounding contrast. There is mild adjacent jejunal wall thickening. Malignancy cannot be excluded. Capsule endoscopy could be considered for further evaluation, as deemed clinically appropriate.  Mild nonspecific perinephric stranding is noted bilaterally. The kidneys are otherwise unremarkable. There is no evidence of hydronephrosis. No renal or ureteral stones are seen.  No free fluid is identified. The small bowel is unremarkable in appearance. The stomach is within normal limits. No acute vascular abnormalities are seen.  The appendix is normal in caliber, without evidence of appendicitis. Scattered diverticulosis is noted along the descending and proximal sigmoid colon, without evidence of diverticulitis. The colon is otherwise unremarkable.  The bladder is decompressed and not well assessed. The prostate is enlarged, measuring 5.3 cm in transverse dimension. No inguinal lymphadenopathy is seen.  No acute osseous abnormalities are identified.  IMPRESSION: 1. Multiple large masses noted within the liver, bulky in appearance. These measure up to 5.7 cm in size; all of the masses are relatively large. This is  unusual for primary hepatic malignancy, though it remains a possibility. Metastatic disease is a concern. Would correlate with LFTs; these lesions may be the most amenable to biopsy. 2. Focal filling defect within the proximal jejunum, measuring 2.5 x 1.7 cm, with surrounding contrast. Mild adjacent jejunal wall thickening. Malignancy cannot be excluded. Capsule endoscopy could be considered for further evaluation, as deemed clinically appropriate. 3. Scattered diverticulosis along the descending and proximal sigmoid colon, without evidence of diverticulitis. 4. Enlarged prostate noted. 5. Mild cholelithiasis noted; gallbladder otherwise unremarkable.   Electronically Signed   By: Garald Balding M.D.   On: 08/18/2014 01:16    Labs:  CBC:  Recent Labs  08/18/14 1559 08/18/14 2241 08/19/14 0415 08/28/14 1200  WBC 12.0* 9.5 10.9* 12.8*  HGB 8.7* 8.4* 8.4* 9.2*  HCT 28.0* 26.8* 27.2* 29.6*  PLT 384 342 362 370    COAGS:  Recent Labs  08/28/14 1200  INR 1.06  APTT 27    BMP:  Recent Labs  08/17/14 1824 08/18/14 0703 08/19/14 0410  NA 135 137 142  K 4.5 4.3 4.5  CL 103 103 108  CO2 23 25 24   GLUCOSE 158* 64* 72  BUN 34* 33* 22  CALCIUM 9.0 8.4 8.7  CREATININE 1.74* 1.54* 1.26  GFRNONAA 38*  44* 56*  GFRAA 44* 51* 65*    LIVER FUNCTION TESTS:  Recent Labs  08/17/14 1824 08/18/14 0703 08/19/14 0410  BILITOT 0.5 0.6 0.8  AST 35 30 32  ALT 21 17 17   ALKPHOS 96 81 82  PROT 7.8 6.5 6.3  ALBUMIN 3.4* 2.8* 2.8*    TUMOR MARKERS: No results for input(s): AFPTM, CEA, CA199, CHROMGRNA in the last 8760 hours.  Assessment and Plan:  Small bowel cancer with liver involvement Scheduled for chemo soon Here today for PAC placement Risks and Benefits discussed with the patient including, but not limited to bleeding, infection, pneumothorax, or fibrin sheath development and need for additional procedures. All of the patient's questions were answered, patient is agreeable  to proceed. Consent signed and in chart. And Liver lesion biopsy (imaging review --approval with Dr Kathlene Cote) Risks and Benefits discussed with the patient including, but not limited to bleeding, infection, damage to adjacent structures or low yield requiring additional tests. All of the patient's questions were answered, patient is agreeable to proceed. Consent signed and in chart.   Thank you for this interesting consult.  I greatly enjoyed meeting Tyler Evans and look forward to participating in their care.  Signed: Matsue Strom A 08/28/2014, 1:35 PM   I spent a total of  20 Minutes   in face to face in clinical consultation, greater than 50% of which was counseling/coordinating care for [facility] and liver lesion bx

## 2014-08-28 NOTE — Sedation Documentation (Signed)
Port a cath insertion complete pt transferred via stretcher to [place] suite for liver biopsy

## 2014-08-28 NOTE — Discharge Instructions (Signed)
Implanted Port Insertion, Care After Refer to this sheet in the next few weeks. These instructions provide you with information on caring for yourself after your procedure. Your health care provider may also give you more specific instructions. Your treatment has been planned according to current medical practices, but problems sometimes occur. Call your health care provider if you have any problems or questions after your procedure. WHAT TO EXPECT AFTER THE PROCEDURE After your procedure, it is typical to have the following:   Discomfort at the port insertion site. Ice packs to the area will help.  Bruising on the skin over the port. This will subside in 3-4 days. HOME CARE INSTRUCTIONS  After your port is placed, you will get a manufacturer's information card. The card has information about your port. Keep this card with you at all times.   Know what kind of port you have. There are many types of ports available.   Wear a medical alert bracelet in case of an emergency. This can help alert health care workers that you have a port.   The port can stay in for as long as your health care provider believes it is necessary.   A home health care nurse may give medicines and take care of the port.   You or a family member can get special training and directions for giving medicine and taking care of the port at home.  SEEK MEDICAL CARE IF:   Your port does not flush or you are unable to get a blood return.   You have a fever or chills. SEEK IMMEDIATE MEDICAL CARE IF:  You have new fluid or pus coming from your incision.   You notice a bad smell coming from your incision site.   You have swelling, pain, or more redness at the incision or port site.   You have chest pain or shortness of breath. Document Released: 03/09/2013 Document Revised: 05/24/2013 Document Reviewed: 03/09/2013 [facility] Patient Information 2015 [place], [place]. This information is not intended to replace  advice given to you by your health care provider. Make sure you discuss any questions you have with your health care provider. Liver Biopsy, Care After Refer to this sheet in the next few weeks. These instructions provide you with information on caring for yourself after your procedure. Your health care provider may also give you more specific instructions. Your treatment has been planned according to current medical practices, but problems sometimes occur. Call your health care provider if you have any problems or questions after your procedure. WHAT TO EXPECT AFTER THE PROCEDURE After your procedure, it is typical to have the following:  A small amount of discomfort in the area where the biopsy was done and in the right shoulder or shoulder blade.  A small amount of bruising around the area where the biopsy was done and on the skin over the liver.  Sleepiness and fatigue for the rest of the day. HOME CARE INSTRUCTIONS   Rest at home for 1-2 days or as directed by your health care provider.  Have a friend or family member stay with you for at least 24 hours.  Because of the medicines used during the procedure, you should not do the following things in the first 24 hours:  Drive.  Use machinery.  Be responsible for the care of other people.  Sign legal documents.  Take a bath or shower.  There are many different ways to close and cover an incision, including stitches, skin glue, and  adhesive strips. Follow your health care provider's instructions on:  Incision care.  Bandage (dressing) changes and removal.  Incision closure removal.  Do not drink alcohol in the first week.  Do not lift more than 5 pounds or play contact sports for 2 weeks after this test.  Take medicines only as directed by your health care provider. Do not take medicine containing aspirin or non-steroidal anti-inflammatory medicines such as ibuprofen for 1 week after this test.  It is your responsibility  to get your test results. SEEK MEDICAL CARE IF:   You have increased bleeding from an incision that results in more than a small spot of blood.  You have redness, swelling, or increasing pain in any incisions.  You notice a discharge or a bad smell coming from any of your incisions.  You have a fever or chills. SEEK IMMEDIATE MEDICAL CARE IF:   You develop swelling, bloating, or pain in your abdomen.  You become dizzy or faint.  You develop a rash.  You are nauseous or vomit.  You have difficulty breathing, feel short of breath, or feel faint.  You develop chest pain.  You have problems with your speech or vision.  You have trouble balancing or moving your arms or legs. Document Released: 12/06/2004 Document Revised: 10/03/2013 Document Reviewed: 07/15/2013 [facility] Patient Information 2015 [place], [place]. This information is not intended to replace advice given to you by your health care provider. Make sure you discuss any questions you have with your health care provider.

## 2014-08-28 NOTE — Sedation Documentation (Signed)
In [place] for liver biopsy

## 2014-08-30 ENCOUNTER — Inpatient Hospital Stay (HOSPITAL_COMMUNITY): Admission: RE | Admit: 2014-08-30 | Payer: Medicare Other | Source: Ambulatory Visit

## 2014-08-30 ENCOUNTER — Ambulatory Visit (HOSPITAL_COMMUNITY): Admission: RE | Admit: 2014-08-30 | Payer: Medicare Other | Source: Ambulatory Visit

## 2014-08-31 ENCOUNTER — Telehealth: Payer: Self-pay | Admitting: *Deleted

## 2014-08-31 ENCOUNTER — Encounter: Payer: Self-pay | Admitting: *Deleted

## 2014-08-31 ENCOUNTER — Other Ambulatory Visit: Payer: Medicare Other

## 2014-08-31 ENCOUNTER — Other Ambulatory Visit (HOSPITAL_BASED_OUTPATIENT_CLINIC_OR_DEPARTMENT_OTHER): Payer: Medicare Other

## 2014-08-31 ENCOUNTER — Other Ambulatory Visit: Payer: Self-pay | Admitting: *Deleted

## 2014-08-31 DIAGNOSIS — C787 Secondary malignant neoplasm of liver and intrahepatic bile duct: Secondary | ICD-10-CM | POA: Diagnosis not present

## 2014-08-31 DIAGNOSIS — C179 Malignant neoplasm of small intestine, unspecified: Secondary | ICD-10-CM

## 2014-08-31 DIAGNOSIS — D509 Iron deficiency anemia, unspecified: Secondary | ICD-10-CM | POA: Diagnosis not present

## 2014-08-31 DIAGNOSIS — C171 Malignant neoplasm of jejunum: Secondary | ICD-10-CM | POA: Diagnosis not present

## 2014-08-31 LAB — COMPREHENSIVE METABOLIC PANEL (CC13)
ALK PHOS: 126 U/L (ref 40–150)
ALT: 19 U/L (ref 0–55)
ANION GAP: 13 meq/L — AB (ref 3–11)
AST: 31 U/L (ref 5–34)
Albumin: 2.7 g/dL — ABNORMAL LOW (ref 3.5–5.0)
BILIRUBIN TOTAL: 0.4 mg/dL (ref 0.20–1.20)
BUN: 19.7 mg/dL (ref 7.0–26.0)
CO2: 24 mEq/L (ref 22–29)
CREATININE: 1.2 mg/dL (ref 0.7–1.3)
Calcium: 9.1 mg/dL (ref 8.4–10.4)
Chloride: 100 mEq/L (ref 98–109)
EGFR: 58 mL/min/{1.73_m2} — ABNORMAL LOW (ref >90)
Glucose: 215 mg/dl — ABNORMAL HIGH (ref 70–140)
Potassium: 4 mEq/L (ref 3.5–5.1)
Sodium: 137 mEq/L (ref 136–145)
Total Protein: 7 g/dL (ref 6.4–8.3)

## 2014-08-31 LAB — IRON AND TIBC CHCC
%SAT: 5 % — AB (ref 20–55)
Iron: 13 ug/dL — ABNORMAL LOW (ref 42–163)
TIBC: 285 ug/dL (ref 202–409)
UIBC: 272 ug/dL (ref 117–376)

## 2014-08-31 LAB — CBC WITH DIFFERENTIAL/PLATELET
BASO%: 0.5 % (ref 0.0–2.0)
Basophils Absolute: 0.1 10*3/uL (ref 0.0–0.1)
EOS%: 2 % (ref 0.0–7.0)
Eosinophils Absolute: 0.2 10*3/uL (ref 0.0–0.5)
HEMATOCRIT: 27.4 % — AB (ref 38.4–49.9)
HGB: 8.4 g/dL — ABNORMAL LOW (ref 13.0–17.1)
LYMPH%: 12.1 % — ABNORMAL LOW (ref 14.0–49.0)
MCH: 23.5 pg — AB (ref 27.2–33.4)
MCHC: 30.7 g/dL — AB (ref 32.0–36.0)
MCV: 76.5 fL — ABNORMAL LOW (ref 79.3–98.0)
MONO#: 0.9 10*3/uL (ref 0.1–0.9)
MONO%: 8.4 % (ref 0.0–14.0)
NEUT#: 8.5 10*3/uL — ABNORMAL HIGH (ref 1.5–6.5)
NEUT%: 77 % — AB (ref 39.0–75.0)
Platelets: 374 10*3/uL (ref 140–400)
RBC: 3.59 10*6/uL — AB (ref 4.20–5.82)
RDW: 20.4 % — AB (ref 11.0–14.6)
WBC: 11 10*3/uL — ABNORMAL HIGH (ref 4.0–10.3)
lymph#: 1.3 10*3/uL (ref 0.9–3.3)

## 2014-08-31 LAB — FERRITIN CHCC: Ferritin: 183 ng/ml (ref 22–316)

## 2014-08-31 LAB — PROTIME-INR
INR: 1.1 — ABNORMAL LOW (ref 2.00–3.50)
Protime: 13.2 Seconds (ref 10.6–13.4)

## 2014-08-31 LAB — HOLD TUBE, BLOOD BANK

## 2014-08-31 NOTE — Telephone Encounter (Signed)
Was informed by lab of pt's Hgb 8.4 today.  Spoke with pt on cell phone.  Pt denied shortness of breath, denied fatigue, stated " I'm feeling fine ".  Pt did not feel needing blood transfusion today.  Pt also stated the chemo education class was very informative.  Pt aware of all returned appts.

## 2014-09-01 ENCOUNTER — Telehealth: Payer: Self-pay | Admitting: *Deleted

## 2014-09-01 LAB — APTT: aPTT: 29 seconds (ref 24–37)

## 2014-09-01 LAB — CEA: CEA: 189.8 ng/mL — ABNORMAL HIGH (ref 0.0–5.0)

## 2014-09-01 NOTE — Telephone Encounter (Signed)
Patient called stating,"Dr. Burr Medico was going to call in my anti nausea medications and Emla cream to my pharmacy. I start chemotherapy next Wednesday, 09/06/14. I use [facility] on [address]." Instructed patient that this message will be given to Dr. Burr Medico and her nurse.

## 2014-09-04 ENCOUNTER — Other Ambulatory Visit: Payer: Self-pay | Admitting: Hematology

## 2014-09-04 DIAGNOSIS — C179 Malignant neoplasm of small intestine, unspecified: Secondary | ICD-10-CM

## 2014-09-04 MED ORDER — LIDOCAINE-PRILOCAINE 2.5-2.5 % EX CREA: TOPICAL_CREAM | CUTANEOUS | Status: DC

## 2014-09-04 MED ORDER — ONDANSETRON HCL 8 MG PO TABS: 8.0000 mg | ORAL_TABLET | Freq: Two times a day (BID) | ORAL | Status: DC

## 2014-09-04 NOTE — Telephone Encounter (Signed)
Pt notified of scripts called to pharmacy.

## 2014-09-04 NOTE — Telephone Encounter (Signed)
Notified pt that emla & zofran scripts sent to his pharmacy.  Explained how to use emla & zofran.

## 2014-09-05 ENCOUNTER — Ambulatory Visit (HOSPITAL_COMMUNITY)
Admission: RE | Admit: 2014-09-05 | Discharge: 2014-09-05 | Disposition: A | Discharge status: Home / Self Care | Payer: Medicare Other | Source: Ambulatory Visit | Attending: Nurse Practitioner | Admitting: Nurse Practitioner

## 2014-09-05 DIAGNOSIS — C179 Malignant neoplasm of small intestine, unspecified: Secondary | ICD-10-CM | POA: Insufficient documentation

## 2014-09-05 LAB — GLUCOSE, CAPILLARY: Glucose-Capillary: 135 mg/dL — ABNORMAL HIGH (ref 70–99)

## 2014-09-05 MED ORDER — FLUDEOXYGLUCOSE F - 18 (FDG) INJECTION
11.0000 | Freq: Once | INTRAVENOUS | Status: AC | PRN
  Administered 2014-09-05: 11 via INTRAVENOUS

## 2014-09-06 ENCOUNTER — Ambulatory Visit (HOSPITAL_BASED_OUTPATIENT_CLINIC_OR_DEPARTMENT_OTHER): Payer: Medicare Other

## 2014-09-06 ENCOUNTER — Encounter: Payer: Self-pay | Admitting: *Deleted

## 2014-09-06 ENCOUNTER — Telehealth: Payer: Self-pay | Admitting: Hematology

## 2014-09-06 ENCOUNTER — Other Ambulatory Visit: Payer: Medicare Other

## 2014-09-06 ENCOUNTER — Ambulatory Visit (HOSPITAL_BASED_OUTPATIENT_CLINIC_OR_DEPARTMENT_OTHER): Payer: Medicare Other | Admitting: Hematology

## 2014-09-06 ENCOUNTER — Encounter: Payer: Self-pay | Admitting: Hematology

## 2014-09-06 VITALS — BP 117/55 | HR 88 | Temp 97.8°F | Resp 18 | Ht 66.0 in | Wt 213.6 lb

## 2014-09-06 DIAGNOSIS — R630 Anorexia: Secondary | ICD-10-CM

## 2014-09-06 DIAGNOSIS — D630 Anemia in neoplastic disease: Secondary | ICD-10-CM | POA: Diagnosis not present

## 2014-09-06 DIAGNOSIS — N183 Chronic kidney disease, stage 3 (moderate): Secondary | ICD-10-CM

## 2014-09-06 DIAGNOSIS — C171 Malignant neoplasm of jejunum: Secondary | ICD-10-CM | POA: Diagnosis not present

## 2014-09-06 DIAGNOSIS — I10 Essential (primary) hypertension: Secondary | ICD-10-CM

## 2014-09-06 DIAGNOSIS — C179 Malignant neoplasm of small intestine, unspecified: Secondary | ICD-10-CM

## 2014-09-06 DIAGNOSIS — E119 Type 2 diabetes mellitus without complications: Secondary | ICD-10-CM

## 2014-09-06 DIAGNOSIS — Z5111 Encounter for antineoplastic chemotherapy: Secondary | ICD-10-CM | POA: Diagnosis not present

## 2014-09-06 DIAGNOSIS — D509 Iron deficiency anemia, unspecified: Secondary | ICD-10-CM

## 2014-09-06 DIAGNOSIS — C787 Secondary malignant neoplasm of liver and intrahepatic bile duct: Secondary | ICD-10-CM

## 2014-09-06 DIAGNOSIS — R634 Abnormal weight loss: Secondary | ICD-10-CM

## 2014-09-06 MED ORDER — OXALIPLATIN CHEMO INJECTION 100 MG/20ML
85.0000 mg/m2 | Freq: Once | INTRAVENOUS | Status: AC
  Administered 2014-09-06: 180 mg via INTRAVENOUS
  Filled 2014-09-06: qty 36

## 2014-09-06 MED ORDER — DEXTROSE 5 % IV SOLN
Freq: Once | INTRAVENOUS | Status: AC
  Administered 2014-09-06: 10:00:00 via INTRAVENOUS

## 2014-09-06 MED ORDER — SODIUM CHLORIDE 0.9 % IV SOLN
2400.0000 mg/m2 | INTRAVENOUS | Status: DC
  Administered 2014-09-06: 5050 mg via INTRAVENOUS
  Filled 2014-09-06: qty 101

## 2014-09-06 MED ORDER — LEUCOVORIN CALCIUM INJECTION 350 MG
403.0000 mg/m2 | Freq: Once | INTRAVENOUS | Status: AC
  Administered 2014-09-06: 850 mg via INTRAVENOUS
  Filled 2014-09-06: qty 42.5

## 2014-09-06 MED ORDER — DEXAMETHASONE SODIUM PHOSPHATE 100 MG/10ML IJ SOLN
Freq: Once | INTRAMUSCULAR | Status: AC
  Administered 2014-09-06: 11:00:00 via INTRAVENOUS
  Filled 2014-09-06: qty 4

## 2014-09-06 NOTE — [facility] Oncology Navigator Note (Signed)
Met with patient and wife during initial chemotherapy treatment to provide support and assess for needs to promote continuity of care. Discussed: 1. Support group on 4/18 2. Nausea meds  Merceda Elks, RN, BSN GI Oncology [place]

## 2014-09-06 NOTE — Patient Instructions (Signed)
[facility] Discharge Instructions for Patients Receiving Chemotherapy  Today you received the following chemotherapy agents: Oxaliplatin, Leucovorin, and Adrucil   To help prevent nausea and vomiting after your treatment, we encourage you to take your nausea medication as directed.    If you develop nausea and vomiting that is not controlled by your nausea medication, call the clinic.   BELOW ARE SYMPTOMS THAT SHOULD BE REPORTED IMMEDIATELY:  *FEVER GREATER THAN 100.5 F  *CHILLS WITH OR WITHOUT FEVER  NAUSEA AND VOMITING THAT IS NOT CONTROLLED WITH YOUR NAUSEA MEDICATION  *UNUSUAL SHORTNESS OF BREATH  *UNUSUAL BRUISING OR BLEEDING  TENDERNESS IN MOUTH AND THROAT WITH OR WITHOUT PRESENCE OF ULCERS  *URINARY PROBLEMS  *BOWEL PROBLEMS  UNUSUAL RASH Items with * indicate a potential emergency and should be followed up as soon as possible.  Feel free to call the clinic you have any questions or concerns. The clinic phone number is [phone].  Please show the CHEMO ALERT CARD at check-in to the Emergency Department and triage nurse.   

## 2014-09-06 NOTE — Telephone Encounter (Signed)
Appointments made ad avs pritned for patient

## 2014-09-06 NOTE — Progress Notes (Signed)
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    08/18/2014 Pathology Results Liver biopsy showed metastatic adenocarcinoma, consistent with GI primary   08/18/2014 Initial Diagnosis Small bowel cancer   08/18/2014 Procedure small bowel enteroscopy with biopsy by Dr. Benson Norway showed at the proximal jejunum there was evidence of abnormal mucosa and friability, biopsy was obtained.    09/05/2014 Imaging PET hypermetabolic mass involving a small bowel loop with adjacent mesenteric lymphadenopathy, and diffuse liver metastasis and mild Pallor metabolic lymphadenopathy in porta hepatis.   09/06/2014 -  Chemotherapy mFOLFOX      HISTORY OF PRESENTING ILLNESS:  Tyler Evans 69 y.o. male is here because of a recent diagnosis of small bowel cancer. He presented to his PCP on 08/17/2014 with complaints of feeling weak and a one month history of a cough. He was found to be anemic and was referred to the emergency department. Hemoglobin was found to be 7.6, MCV 76.6; creatinine was elevated at 1.74. Stool was Hemoccult positive.  CT abdomen/pelvis without contrast on 08/17/2014 showed multiple large masses within the liver, bulky in appearance. The largest measured approximately 5.7 cm. There appeared to be a focal filling defect within the proximal jejunum measuring approximately 2.5 x 1.7 cm. There was mild adjacent jejunal wall thickening. The lung bases were clear.  On 08/18/2014 he underwent  a small bowel enteroscopy with biopsy by Dr. Benson Norway. The esophagus and gastric lumen were normal. At the proximal jejunum there was evidence of abnormal mucosa and friability. The pediatric colonoscope was not able to traverse the area. Biopsies were obtained. An ultraslim colonoscope was then utilized. This colonoscope was able to traverse the area of stenosis which measured approximately 3 cm in length. 50% of the lumen was ulcerated. 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 MMR stains are pending.  He was discharged home on 08/19/2014.  INTERIM HISTORY:  Tyler Evans returns for follow-up and the first cycle chemotherapy. He tolerates the liver biopsy very well without complications. No significant change since we saw him last time. He still has moderate fatigue and low appetite, but eats and drinks fairly, and able to function well at home. He denies significant pain, nausea. He has mild numbness and tingling on fingers, more noticeable lately, no neuropathy of feet. His primary care physician has changed some of his diabetic and the blood pressure medication. His morning blood sugar is in the range of 60-100. Metformin has been stopped. Lisinopril dose was reduced.  MEDICAL HISTORY:  Past Medical History  Diagnosis Date  . Diabetes mellitus without complication   . Hypertension   . Small bowel cancer 08/18/2014    SURGICAL HISTORY: Past Surgical History  Procedure Laterality Date  . Enteroscopy N/A 08/18/2014    Procedure: ENTEROSCOPY;  Surgeon: Carol Ada, MD;  Location: [facility] ENDOSCOPY;  Service: Endoscopy;  Laterality: N/A;    SOCIAL HISTORY: History   Social History  . Marital Status: Married  Spouse Name: N/A  . Number of Children: 2  . Years of Education: N/A   Occupational History  .  retired Ambulance person    Social History Main Topics  . Smoking status: Never Smoker   . Smokeless tobacco: Not on file  . Alcohol Use: No  . Drug  Use: Not on file  . Sexual Activity: Not on file   Other Topics Concern  . Not on file   Social History Narrative  He lives in [place]. He is married. He has 2 children both reported to be in good health. No tobacco or alcohol use. He is a retired Ambulance person.  FAMILY HISTORY: Family History  Problem Relation Age of Onset  . Heart failure, Alzheimer's  Mother   . Lung cancer Father   . Kidney cancer Brother     ALLERGIES:  is allergic to penicillins.  MEDICATIONS:  Current Outpatient Prescriptions  Medication Sig Dispense Refill  . atorvastatin (LIPITOR) 20 MG tablet Take 20 mg by mouth daily.    Marland Kitchen glipiZIDE (GLUCOTROL) 10 MG tablet Take 10 mg by mouth 2 (two) times daily before a meal.    . insulin glargine (LANTUS) 100 UNIT/ML injection Inject 40 Units into the skin at bedtime.    . pantoprazole (PROTONIX) 40 MG tablet Take 1 tablet (40 mg total) by mouth daily. 30 tablet 0  . chlorthalidone (HYGROTON) 25 MG tablet Take 25 mg by mouth daily.    Marland Kitchen lisinopril (PRINIVIL,ZESTRIL) 40 MG tablet Take 40 mg by mouth at bedtime.    . metFORMIN (GLUCOPHAGE) 1000 MG tablet Take 1,000 mg by mouth 2 (two) times daily with a meal.         REVIEW OF SYSTEMS:   Constitutional: Denies fevers, chills or abnormal night sweats; poor appetite. He estimates 20 pound weight loss over the past month Eyes: Denies blurriness of vision, double vision or watery eyes Ears, nose, mouth, throat, and face: Denies mucositis or sore throat Respiratory: One month history of a nonproductive cough;  no shortness of breath Cardiovascular: Denies palpitation, chest discomfort or lower extremity swelling Gastrointestinal:  Denies nausea, heartburn. No dysphagia. He has noted less frequent bowel movements over the past 2 weeks. Does not characterize this as constipation. Skin: Denies abnormal skin rashes Lymphatics: Denies new lymphadenopathy or easy bruising Neurological: Denies numbness, tingling. No  extremity weakness Behavioral/Psych: Mood is stable, no new changes  All other systems were reviewed with the patient and are negative.  PHYSICAL EXAMINATION: ECOG PERFORMANCE STATUS: 1 - Symptomatic but completely ambulatory  Filed Vitals:   09/06/14 0838  BP: 117/55  Pulse: 88  Temp: 97.8 F (36.6 C)  Resp: 18   Filed Weights   09/06/14 0838  Weight: 213 lb 9.6 oz (96.888 kg)    GENERAL:alert, no distress and comfortable SKIN: skin color, texture, turgor are normal, no rashes or significant lesions EYES: normal, conjunctiva are pink and non-injected, sclera clear OROPHARYNX:no exudate, no erythema and lips, buccal mucosa, and tongue normal  NECK: supple, thyroid normal size, non-tender, without nodularity LYMPH:  no palpable lymphadenopathy in the cervical, axillary or inguinal regions LUNGS: clear to auscultation and percussion with normal breathing effort HEART: regular rate & rhythm and no murmurs and no lower extremity edema ABDOMEN:abdomen soft, mild tenderness at the right upper quadrant , no rebound pain, normal bowel sounds Musculoskeletal:no cyanosis of digits and no clubbing  PSYCH: alert & oriented x 3 with fluent speech NEURO: no focal motor/sensory deficits  LABORATORY DATA:  I have reviewed the data as listed Lab Results  Component Value Date   WBC 11.0* 08/31/2014   HGB 8.4* 08/31/2014   HCT 27.4* 08/31/2014   MCV 76.5* 08/31/2014   PLT 374 08/31/2014    Recent Labs  08/17/14 1824 08/18/14 0703 08/19/14 0410 08/31/14 1122  NA 135 137 142 137  K 4.5 4.3 4.5 4.0  CL 103 103 108  --   CO2 $Re'23 25 24 24  'QgH$ GLUCOSE 158* 64* 72 215*  BUN 34* 33* 22 19.7  CREATININE 1.74* 1.54* 1.26 1.2  CALCIUM 9.0 8.4 8.7 9.1  GFRNONAA 38* 44* 56*  --   GFRAA 44* 51* 65*  --   PROT 7.8 6.5 6.3 7.0  ALBUMIN 3.4* 2.8* 2.8* 2.7*  AST 35 30 32 31  ALT $Re'21 17 17 19  'xNg$ ALKPHOS 96 81 82 126  BILITOT 0.5 0.6 0.8 0.40   Pathology report:  Small Intestine Biopsy,  jejunal mass 08/18/2014 - INVASIVE ADENOCARCINOMA IN A BACKGROUND OF TUBULOVILLOUS ADENOMA. ADDITIONAL INFORMATION: Mismatch Repair (MMR) Protein Immunohistochemistry (IHC) IHC Expression Result (LIMITED TUMOR): MLH1: Preserved nuclear expression (greater 50% tumor expression) MSH2: Preserved nuclear expression (greater 50% tumor expression) MSH6: Preserved nuclear expression (greater 50% tumor expression) PMS2: Preserved nuclear expression (greater 50% tumor expression) * Internal control demonstrates intact nuclear expression Interpretation: NORMAL There is preserved expression  Diagnosis 08/28/2014  Liver, needle/core biopsy, right lobe - METASTATIC ADEN  RADIOGRAPHIC STUDIES: I have personally reviewed the radiological images as listed and agreed with the findings in the report.  Nm Pet Image Initial (pi) Skull Base To Thigh 09/05/2014    IMPRESSION: Hypermetabolic mass involving a small bowel loop with adjacent mesenteric lymphadenopathy in the left abdomen, consistent with known primary small bowel carcinoma. No evidence of small bowel obstruction.  Diffuse liver metastases. Mild hypermetabolic lymphadenopathy in porta hepatis, also suspicious for metastatic disease.  No evidence of metastatic disease within the neck, chest, or pelvis.  Incidental findings including cholelithiasis, diverticulosis, and mildly enlarged prostate.   Electronically Signed   By: Earle Gell M.D.   On: 09/05/2014 17:29    ASSESSMENT & PLAN:  69 year old gentleman with past medical history of diabetes, hypertension, who presents with anemia and weakness.  1. 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  -Unfortunately he has stage IV disease with diffuse liver metastasis, this is a incurable disease, with overall very poor prognosis. -I recommend systemic chemotherapy  for FOLFOX, with a goal of palliation and prolonged his life.  Chemo consent:  Side effects including but does not not limited to, fatigue, nausea, vomiting, diarrhea, hair loss, neuropathy and cold sensitivity, fluid retention, renal and kidney dysfunction, neutropenic fever, needed for blood transfusion, bleeding, especially coronary artery spasm and heart attack from 5-FU, were discussed with patient in great detail. He agrees to proceed. First cycle today  -Given his impaired renal function, I'll omit the 5-FU bolus -Restaging after 4 cycles of chemotherapy -I have requested his tumor to be tested for K-ras NRAS mutation, to see if he would be a candidate for EGFR targeted therapy  2. Microcytic anemia in the setting of heme-positive stool status post red cell transfusion support 08/17/2014. -His unknown level and saturation were low, although ferritin is normal, he has some degree of iron deficient anemia, and anemia of malignancy  -His hemoglobin was 8.4 last week, but he  is not very somatic. We'll follow his CBC next week. -Consider blood transfusion if hemoglobin less than 8 or symptomatic anemia -IV feraheme 510 mg twice in the  next 2 weeks  -He will start ferrous sulfate 1-2 tablet daily   3. anorexia/weight loss secondary to #1. -I encouraged him to take some nutritional supplement  4. Hypertension and DM  -He will continue follow-up with his primary care physician. -We discussed that we will monitor his blood pressure and blood sugar closely and may need to adjust his medication during the chemotherapy   5. CKD, stage III -His creatinine has improved after his medication adjustment -I encouraged him to drink adequately and avoid dehydration. We discussed he may have more side effects from chemotherapy due to his kidney problem. -I'll adjust his chemotherapy dose as needed based on his kidney function. I'll omit the 5-FU bolus for for cycle and keep the rest full  dose.   Plan: -FOLFOX cycle one today, no 5 FU bolus. No Neulasta on day 3 with this cycle  -Return to clinic for follow-up next week with a wall for our APP. I'll see him back in 2 weeks before second cycle  -IV Feraheme in the next 2 weeks    Truitt Merle  09/06/2014

## 2014-09-07 ENCOUNTER — Inpatient Hospital Stay (HOSPITAL_COMMUNITY)
Admission: EM | Admit: 2014-09-07 | Discharge: 2014-09-11 | DRG: 872 | Disposition: A | Discharge status: Home / Self Care | Payer: Medicare Other | Attending: Internal Medicine | Admitting: Internal Medicine

## 2014-09-07 ENCOUNTER — Encounter (HOSPITAL_COMMUNITY): Payer: Self-pay | Admitting: *Deleted

## 2014-09-07 ENCOUNTER — Emergency Department (HOSPITAL_COMMUNITY): Payer: Medicare Other

## 2014-09-07 DIAGNOSIS — N179 Acute kidney failure, unspecified: Secondary | ICD-10-CM | POA: Diagnosis present

## 2014-09-07 DIAGNOSIS — E872 Acidosis: Secondary | ICD-10-CM | POA: Diagnosis present

## 2014-09-07 DIAGNOSIS — R059 Cough, unspecified: Secondary | ICD-10-CM | POA: Diagnosis present

## 2014-09-07 DIAGNOSIS — Z809 Family history of malignant neoplasm, unspecified: Secondary | ICD-10-CM

## 2014-09-07 DIAGNOSIS — I10 Essential (primary) hypertension: Secondary | ICD-10-CM | POA: Diagnosis present

## 2014-09-07 DIAGNOSIS — D759 Disease of blood and blood-forming organs, unspecified: Secondary | ICD-10-CM | POA: Diagnosis present

## 2014-09-07 DIAGNOSIS — D630 Anemia in neoplastic disease: Secondary | ICD-10-CM | POA: Diagnosis present

## 2014-09-07 DIAGNOSIS — Z8249 Family history of ischemic heart disease and other diseases of the circulatory system: Secondary | ICD-10-CM

## 2014-09-07 DIAGNOSIS — Z794 Long term (current) use of insulin: Secondary | ICD-10-CM

## 2014-09-07 DIAGNOSIS — A419 Sepsis, unspecified organism: Secondary | ICD-10-CM | POA: Diagnosis not present

## 2014-09-07 DIAGNOSIS — D509 Iron deficiency anemia, unspecified: Secondary | ICD-10-CM | POA: Diagnosis present

## 2014-09-07 DIAGNOSIS — C179 Malignant neoplasm of small intestine, unspecified: Secondary | ICD-10-CM | POA: Diagnosis present

## 2014-09-07 DIAGNOSIS — D649 Anemia, unspecified: Secondary | ICD-10-CM | POA: Diagnosis present

## 2014-09-07 DIAGNOSIS — R6510 Systemic inflammatory response syndrome (SIRS) of non-infectious origin without acute organ dysfunction: Secondary | ICD-10-CM | POA: Diagnosis present

## 2014-09-07 DIAGNOSIS — E1165 Type 2 diabetes mellitus with hyperglycemia: Secondary | ICD-10-CM | POA: Diagnosis present

## 2014-09-07 DIAGNOSIS — Z79899 Other long term (current) drug therapy: Secondary | ICD-10-CM

## 2014-09-07 DIAGNOSIS — R05 Cough: Secondary | ICD-10-CM

## 2014-09-07 DIAGNOSIS — D638 Anemia in other chronic diseases classified elsewhere: Secondary | ICD-10-CM | POA: Diagnosis present

## 2014-09-07 DIAGNOSIS — D6481 Anemia due to antineoplastic chemotherapy: Secondary | ICD-10-CM | POA: Diagnosis present

## 2014-09-07 DIAGNOSIS — C787 Secondary malignant neoplasm of liver and intrahepatic bile duct: Secondary | ICD-10-CM | POA: Diagnosis present

## 2014-09-07 DIAGNOSIS — D62 Acute posthemorrhagic anemia: Secondary | ICD-10-CM | POA: Diagnosis present

## 2014-09-07 DIAGNOSIS — E119 Type 2 diabetes mellitus without complications: Secondary | ICD-10-CM

## 2014-09-07 DIAGNOSIS — Z880 Allergy status to penicillin: Secondary | ICD-10-CM

## 2014-09-07 DIAGNOSIS — R739 Hyperglycemia, unspecified: Secondary | ICD-10-CM | POA: Diagnosis present

## 2014-09-07 DIAGNOSIS — K922 Gastrointestinal hemorrhage, unspecified: Secondary | ICD-10-CM | POA: Diagnosis present

## 2014-09-07 NOTE — ED Notes (Signed)
Pt reports fever tonight of 101.1 with cough tonight.  Pt presently has chemo infusing in his PAC.  Pt reports dry mouth with chills.  Pt reports mild abd pain.

## 2014-09-08 ENCOUNTER — Encounter (HOSPITAL_COMMUNITY): Payer: Self-pay | Admitting: *Deleted

## 2014-09-08 ENCOUNTER — Telehealth: Payer: Self-pay | Admitting: *Deleted

## 2014-09-08 DIAGNOSIS — N179 Acute kidney failure, unspecified: Secondary | ICD-10-CM

## 2014-09-08 DIAGNOSIS — R05 Cough: Secondary | ICD-10-CM | POA: Diagnosis present

## 2014-09-08 DIAGNOSIS — Z79899 Other long term (current) drug therapy: Secondary | ICD-10-CM | POA: Diagnosis not present

## 2014-09-08 DIAGNOSIS — C171 Malignant neoplasm of jejunum: Secondary | ICD-10-CM | POA: Diagnosis not present

## 2014-09-08 DIAGNOSIS — D649 Anemia, unspecified: Secondary | ICD-10-CM

## 2014-09-08 DIAGNOSIS — E119 Type 2 diabetes mellitus without complications: Secondary | ICD-10-CM

## 2014-09-08 DIAGNOSIS — D509 Iron deficiency anemia, unspecified: Secondary | ICD-10-CM | POA: Diagnosis present

## 2014-09-08 DIAGNOSIS — C179 Malignant neoplasm of small intestine, unspecified: Secondary | ICD-10-CM | POA: Diagnosis present

## 2014-09-08 DIAGNOSIS — K922 Gastrointestinal hemorrhage, unspecified: Secondary | ICD-10-CM | POA: Diagnosis present

## 2014-09-08 DIAGNOSIS — C787 Secondary malignant neoplasm of liver and intrahepatic bile duct: Secondary | ICD-10-CM | POA: Diagnosis present

## 2014-09-08 DIAGNOSIS — N289 Disorder of kidney and ureter, unspecified: Secondary | ICD-10-CM

## 2014-09-08 DIAGNOSIS — A419 Sepsis, unspecified organism: Secondary | ICD-10-CM | POA: Diagnosis present

## 2014-09-08 DIAGNOSIS — Z880 Allergy status to penicillin: Secondary | ICD-10-CM | POA: Diagnosis not present

## 2014-09-08 DIAGNOSIS — R6510 Systemic inflammatory response syndrome (SIRS) of non-infectious origin without acute organ dysfunction: Secondary | ICD-10-CM | POA: Diagnosis present

## 2014-09-08 DIAGNOSIS — D638 Anemia in other chronic diseases classified elsewhere: Secondary | ICD-10-CM | POA: Diagnosis present

## 2014-09-08 DIAGNOSIS — R509 Fever, unspecified: Secondary | ICD-10-CM

## 2014-09-08 DIAGNOSIS — D630 Anemia in neoplastic disease: Secondary | ICD-10-CM | POA: Diagnosis present

## 2014-09-08 DIAGNOSIS — I10 Essential (primary) hypertension: Secondary | ICD-10-CM | POA: Diagnosis present

## 2014-09-08 DIAGNOSIS — Z794 Long term (current) use of insulin: Secondary | ICD-10-CM | POA: Diagnosis not present

## 2014-09-08 DIAGNOSIS — E872 Acidosis: Secondary | ICD-10-CM | POA: Diagnosis present

## 2014-09-08 DIAGNOSIS — D62 Acute posthemorrhagic anemia: Secondary | ICD-10-CM | POA: Diagnosis present

## 2014-09-08 DIAGNOSIS — R059 Cough, unspecified: Secondary | ICD-10-CM | POA: Diagnosis present

## 2014-09-08 DIAGNOSIS — D759 Disease of blood and blood-forming organs, unspecified: Secondary | ICD-10-CM | POA: Diagnosis present

## 2014-09-08 DIAGNOSIS — E1165 Type 2 diabetes mellitus with hyperglycemia: Secondary | ICD-10-CM | POA: Diagnosis present

## 2014-09-08 DIAGNOSIS — Z8249 Family history of ischemic heart disease and other diseases of the circulatory system: Secondary | ICD-10-CM | POA: Diagnosis not present

## 2014-09-08 DIAGNOSIS — Z809 Family history of malignant neoplasm, unspecified: Secondary | ICD-10-CM | POA: Diagnosis not present

## 2014-09-08 DIAGNOSIS — D6481 Anemia due to antineoplastic chemotherapy: Secondary | ICD-10-CM | POA: Diagnosis present

## 2014-09-08 DIAGNOSIS — D500 Iron deficiency anemia secondary to blood loss (chronic): Secondary | ICD-10-CM

## 2014-09-08 LAB — CBC WITH DIFFERENTIAL/PLATELET
BASOS ABS: 0 10*3/uL (ref 0.0–0.1)
BASOS PCT: 0 % (ref 0–1)
EOS ABS: 0 10*3/uL (ref 0.0–0.7)
EOS PCT: 0 % (ref 0–5)
HCT: 20.2 % — ABNORMAL LOW (ref 39.0–52.0)
Hemoglobin: 6.3 g/dL — CL (ref 13.0–17.0)
Lymphocytes Relative: 8 % — ABNORMAL LOW (ref 12–46)
Lymphs Abs: 1.4 10*3/uL (ref 0.7–4.0)
MCH: 24 pg — AB (ref 26.0–34.0)
MCHC: 31.2 g/dL (ref 30.0–36.0)
MCV: 77.1 fL — AB (ref 78.0–100.0)
Monocytes Absolute: 0.4 10*3/uL (ref 0.1–1.0)
Monocytes Relative: 2 % — ABNORMAL LOW (ref 3–12)
NEUTROS PCT: 90 % — AB (ref 43–77)
Neutro Abs: 16.8 10*3/uL — ABNORMAL HIGH (ref 1.7–7.7)
PLATELETS: 521 10*3/uL — AB (ref 150–400)
RBC: 2.62 MIL/uL — ABNORMAL LOW (ref 4.22–5.81)
RDW: 19 % — AB (ref 11.5–15.5)
WBC: 18.6 10*3/uL — ABNORMAL HIGH (ref 4.0–10.5)

## 2014-09-08 LAB — I-STAT CG4 LACTIC ACID, ED: LACTIC ACID, VENOUS: 4.65 mmol/L — AB (ref 0.5–2.0)

## 2014-09-08 LAB — URINE MICROSCOPIC-ADD ON

## 2014-09-08 LAB — URINALYSIS, ROUTINE W REFLEX MICROSCOPIC
Bilirubin Urine: NEGATIVE
Glucose, UA: 100 mg/dL — AB
Ketones, ur: NEGATIVE mg/dL
Leukocytes, UA: NEGATIVE
Nitrite: NEGATIVE
Protein, ur: 30 mg/dL — AB
Specific Gravity, Urine: 1.021 (ref 1.005–1.030)
Urobilinogen, UA: 0.2 mg/dL (ref 0.0–1.0)
pH: 5.5 (ref 5.0–8.0)

## 2014-09-08 LAB — COMPREHENSIVE METABOLIC PANEL
ALBUMIN: 3.1 g/dL — AB (ref 3.5–5.2)
ALT: 39 U/L (ref 0–53)
ANION GAP: 13 (ref 5–15)
AST: 137 U/L — ABNORMAL HIGH (ref 0–37)
Alkaline Phosphatase: 149 U/L — ABNORMAL HIGH (ref 39–117)
BILIRUBIN TOTAL: 1 mg/dL (ref 0.3–1.2)
BUN: 26 mg/dL — AB (ref 6–23)
CHLORIDE: 95 mmol/L — AB (ref 96–112)
CO2: 23 mmol/L (ref 19–32)
CREATININE: 1.59 mg/dL — AB (ref 0.50–1.35)
Calcium: 8.3 mg/dL — ABNORMAL LOW (ref 8.4–10.5)
GFR calc Af Amer: 49 mL/min — ABNORMAL LOW (ref >90)
GFR calc non Af Amer: 42 mL/min — ABNORMAL LOW (ref >90)
Glucose, Bld: 334 mg/dL — ABNORMAL HIGH (ref 70–99)
Potassium: 4.8 mmol/L (ref 3.5–5.1)
SODIUM: 131 mmol/L — AB (ref 135–145)
Total Protein: 7.5 g/dL (ref 6.0–8.3)

## 2014-09-08 LAB — GLUCOSE, CAPILLARY
Glucose-Capillary: 311 mg/dL — ABNORMAL HIGH (ref 70–99)
Glucose-Capillary: 203 mg/dL — ABNORMAL HIGH (ref 70–99)
Glucose-Capillary: 337 mg/dL — ABNORMAL HIGH (ref 70–99)
Glucose-Capillary: 398 mg/dL — ABNORMAL HIGH (ref 70–99)

## 2014-09-08 LAB — MRSA PCR SCREENING: MRSA by PCR: NEGATIVE

## 2014-09-08 LAB — LACTIC ACID, PLASMA
LACTIC ACID, VENOUS: 3.1 mmol/L — AB (ref 0.5–2.0)
Lactic Acid, Venous: 3.2 mmol/L (ref 0.5–2.0)

## 2014-09-08 LAB — PREPARE RBC (CROSSMATCH)

## 2014-09-08 MED ORDER — INSULIN GLARGINE 100 UNIT/ML ~~LOC~~ SOLN: 20.0000 [IU] | Freq: Every day | SUBCUTANEOUS | Status: DC

## 2014-09-08 MED ORDER — VANCOMYCIN HCL IN DEXTROSE 1-5 GM/200ML-% IV SOLN
1000.0000 mg | Freq: Once | INTRAVENOUS | Status: AC
  Administered 2014-09-08: 1000 mg via INTRAVENOUS
  Filled 2014-09-08: qty 200

## 2014-09-08 MED ORDER — HEPARIN SOD (PORK) LOCK FLUSH 100 UNIT/ML IV SOLN: 500.0000 [IU] | INTRAVENOUS | Status: DC | PRN

## 2014-09-08 MED ORDER — LIDOCAINE-PRILOCAINE 2.5-2.5 % EX CREA
1.0000 "application " | TOPICAL_CREAM | Freq: Once | CUTANEOUS | Status: AC | PRN
  Filled 2014-09-08: qty 5

## 2014-09-08 MED ORDER — ATORVASTATIN CALCIUM 20 MG PO TABS
20.0000 mg | ORAL_TABLET | Freq: Every day | ORAL | Status: DC
  Administered 2014-09-08 – 2014-09-10 (×3): 20 mg via ORAL
  Filled 2014-09-08 (×5): qty 1

## 2014-09-08 MED ORDER — INSULIN GLARGINE 100 UNIT/ML ~~LOC~~ SOLN
10.0000 [IU] | Freq: Every day | SUBCUTANEOUS | Status: DC
  Administered 2014-09-08: 10 [IU] via SUBCUTANEOUS
  Filled 2014-09-08: qty 0.1

## 2014-09-08 MED ORDER — INSULIN ASPART 100 UNIT/ML ~~LOC~~ SOLN
0.0000 [IU] | Freq: Every day | SUBCUTANEOUS | Status: DC
  Administered 2014-09-08: 5 [IU] via SUBCUTANEOUS

## 2014-09-08 MED ORDER — SODIUM CHLORIDE 0.9 % IJ SOLN
10.0000 mL | INTRAMUSCULAR | Status: DC | PRN
  Administered 2014-09-08 – 2014-09-11 (×5): 10 mL
  Filled 2014-09-08 (×4): qty 40

## 2014-09-08 MED ORDER — ACETAMINOPHEN 325 MG PO TABS
650.0000 mg | ORAL_TABLET | ORAL | Status: DC | PRN
  Administered 2014-09-08 – 2014-09-09 (×2): 650 mg via ORAL
  Filled 2014-09-08 (×2): qty 2

## 2014-09-08 MED ORDER — SODIUM CHLORIDE 0.9 % IJ SOLN: 10.0000 mL | Freq: Two times a day (BID) | INTRAMUSCULAR | Status: DC

## 2014-09-08 MED ORDER — INSULIN ASPART 100 UNIT/ML ~~LOC~~ SOLN
0.0000 [IU] | Freq: Three times a day (TID) | SUBCUTANEOUS | Status: DC
  Administered 2014-09-08: 11 [IU] via SUBCUTANEOUS
  Administered 2014-09-08: 5 [IU] via SUBCUTANEOUS
  Administered 2014-09-08: 11 [IU] via SUBCUTANEOUS

## 2014-09-08 MED ORDER — PANTOPRAZOLE SODIUM 40 MG PO TBEC
40.0000 mg | DELAYED_RELEASE_TABLET | Freq: Every day | ORAL | Status: DC
  Administered 2014-09-08 – 2014-09-11 (×4): 40 mg via ORAL
  Filled 2014-09-08 (×4): qty 1

## 2014-09-08 MED ORDER — SODIUM CHLORIDE 0.9 % IV SOLN: Freq: Once | INTRAVENOUS | Status: DC

## 2014-09-08 MED ORDER — SODIUM CHLORIDE 0.9 % IV SOLN
INTRAVENOUS | Status: DC
  Administered 2014-09-08: 02:00:00 via INTRAVENOUS

## 2014-09-08 MED ORDER — SODIUM CHLORIDE 0.9 % IV SOLN
INTRAVENOUS | Status: DC
  Administered 2014-09-08: 22:00:00 via INTRAVENOUS
  Administered 2014-09-09: 75 mL/h via INTRAVENOUS
  Administered 2014-09-10 – 2014-09-11 (×2): via INTRAVENOUS

## 2014-09-08 MED ORDER — VANCOMYCIN HCL IN DEXTROSE 750-5 MG/150ML-% IV SOLN
750.0000 mg | Freq: Two times a day (BID) | INTRAVENOUS | Status: DC
  Administered 2014-09-08 – 2014-09-11 (×6): 750 mg via INTRAVENOUS
  Filled 2014-09-08 (×8): qty 150

## 2014-09-08 MED ORDER — LEVOFLOXACIN IN D5W 750 MG/150ML IV SOLN
750.0000 mg | Freq: Once | INTRAVENOUS | Status: AC
  Administered 2014-09-08: 750 mg via INTRAVENOUS
  Filled 2014-09-08: qty 150

## 2014-09-08 MED ORDER — ONDANSETRON HCL 8 MG PO TABS
8.0000 mg | ORAL_TABLET | Freq: Two times a day (BID) | ORAL | Status: DC
  Administered 2014-09-08 – 2014-09-11 (×7): 8 mg via ORAL
  Filled 2014-09-08 (×5): qty 1
  Filled 2014-09-08: qty 2
  Filled 2014-09-08: qty 1
  Filled 2014-09-08: qty 2
  Filled 2014-09-08: qty 1

## 2014-09-08 MED ORDER — LEVOFLOXACIN IN D5W 750 MG/150ML IV SOLN: 750.0000 mg | INTRAVENOUS | Status: DC

## 2014-09-08 MED ORDER — SODIUM CHLORIDE 0.9 % IV BOLUS (SEPSIS)
1000.0000 mL | Freq: Once | INTRAVENOUS | Status: AC
  Administered 2014-09-08: 1000 mL via INTRAVENOUS

## 2014-09-08 MED ORDER — GLUCERNA SHAKE PO LIQD
237.0000 mL | Freq: Three times a day (TID) | ORAL | Status: DC
  Administered 2014-09-08 – 2014-09-11 (×8): 237 mL via ORAL
  Filled 2014-09-08 (×9): qty 237

## 2014-09-08 MED ORDER — LISINOPRIL 20 MG PO TABS
20.0000 mg | ORAL_TABLET | Freq: Every day | ORAL | Status: DC
  Administered 2014-09-09 – 2014-09-10 (×2): 20 mg via ORAL
  Filled 2014-09-08 (×4): qty 1

## 2014-09-08 MED ORDER — INSULIN ASPART 100 UNIT/ML ~~LOC~~ SOLN
0.0000 [IU] | Freq: Three times a day (TID) | SUBCUTANEOUS | Status: DC
  Administered 2014-09-09: 15 [IU] via SUBCUTANEOUS

## 2014-09-08 NOTE — ED Notes (Signed)
Pt. On cardiac monitor. 

## 2014-09-08 NOTE — ED Provider Notes (Signed)
CSN: [id number]     Arrival date & time 09/07/14  2259 History   First MD Initiated Contact with Patient 09/07/14 2349     Chief Complaint  Patient presents with  . Fever  . Cough     (Consider location/radiation/quality/duration/timing/severity/associated sxs/prior Treatment) Patient is a 69 y.o. male presenting with fever and cough. The history is provided by the patient and the spouse.  Fever Max temp prior to arrival:  101.1 Temp source:  Oral Severity:  Moderate Onset quality:  Sudden Timing:  Constant Progression:  Partially resolved Chronicity:  New Relieved by:  Nothing Worsened by:  Nothing tried Ineffective treatments:  None tried Associated symptoms: chills and cough   Associated symptoms: no chest pain, no confusion, no congestion, no diarrhea, no dysuria, no ear pain, no headaches, no myalgias, no rash, no rhinorrhea, no somnolence, no sore throat and no vomiting   Risk factors: no hx of cancer   Cough Associated symptoms: chills and fever   Associated symptoms: no chest pain, no ear pain, no headaches, no myalgias, no rash, no rhinorrhea and no sore throat     Past Medical History  Diagnosis Date  . Diabetes mellitus without complication   . Hypertension   . 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 Enteroscopy N/A 08/18/2014    Procedure: ENTEROSCOPY;  Surgeon: Carol Ada, MD;  Location: [facility];  Service: Endoscopy;  Laterality: N/A;   Family History  Problem Relation Age of Onset  . Heart failure Mother   . Cancer Father   . Cancer Brother    History  Substance Use Topics  . Smoking status: Never Smoker   . Smokeless tobacco: Not on file  . Alcohol Use: No    Review of Systems  Constitutional: Positive for fever and chills.  HENT: Negative for congestion, ear pain, rhinorrhea and sore throat.   Respiratory: Positive for cough.   Cardiovascular: Negative for chest pain.  Gastrointestinal:  Negative for vomiting and diarrhea.  Genitourinary: Negative for dysuria.  Musculoskeletal: Negative for myalgias.  Skin: Negative for rash.  Neurological: Negative for headaches.  Psychiatric/Behavioral: Negative for confusion.  All other systems reviewed and are negative.     Allergies  Penicillins  Home Medications   Prior to Admission medications   Medication Sig Start Date End Date Taking? Authorizing Provider  atorvastatin (LIPITOR) 20 MG tablet Take 20 mg by mouth daily.   Yes Historical Provider, MD  glipiZIDE (GLUCOTROL) 10 MG tablet Take 10 mg by mouth daily before breakfast.    Yes Historical Provider, MD  ondansetron (ZOFRAN) 8 MG tablet Take 1 tablet (8 mg total) by mouth 2 (two) times daily. Start the day after chemo for 2 days. Then take as needed for nausea or vomiting. 09/04/14  Yes Truitt Merle, MD  pantoprazole (PROTONIX) 40 MG tablet Take 1 tablet (40 mg total) by mouth daily. 08/19/14  Yes Robbie Lis, MD  PRESCRIPTION MEDICATION See admin instructions. Receives Oxaliplatin, Leucovorin, and Adrucil chemotherapy every 2 weeks.   Yes Historical Provider, MD  insulin glargine (LANTUS) 100 UNIT/ML injection Inject 20 Units into the skin at bedtime.     Historical Provider, MD  lidocaine-prilocaine (EMLA) cream Apply to affected area once 09/04/14   Truitt Merle, MD  lisinopril (PRINIVIL,ZESTRIL) 40 MG tablet Take 20 mg by mouth at bedtime.     Historical Provider, MD   BP 130/48 mmHg  Pulse 108  Temp(Src) 99.3 F (37.4  C) (Oral)  Resp 25  Ht 5\' 6"  (1.676 m)  Wt 210 lb (95.255 kg)  BMI 33.91 kg/m2  SpO2 100% Physical Exam  Constitutional: He is oriented to person, place, and time. He appears well-developed and well-nourished. No distress.  HENT:  Head: Normocephalic and atraumatic.  Mouth/Throat: Oropharynx is clear and moist. No oropharyngeal exudate.  Eyes: EOM are normal. Pupils are equal, round, and reactive to light.  Neck: Normal range of motion. Neck supple.   Cardiovascular: Normal rate, regular rhythm and intact distal pulses.   Pulmonary/Chest: Effort normal and breath sounds normal. No respiratory distress. He has no wheezes. He has no rales.  Abdominal: Soft. Bowel sounds are normal. There is no tenderness. There is no rebound and no guarding.  Musculoskeletal: Normal range of motion. He exhibits no edema.  Neurological: He is alert and oriented to person, place, and time.  Skin: Skin is warm and dry. There is pallor.  Psychiatric: He has a normal mood and affect.    ED Course  Procedures (including critical care time) Labs Review Labs Reviewed  CBC WITH DIFFERENTIAL/PLATELET - Abnormal; Notable for the following:    WBC 18.6 (*)    RBC 2.62 (*)    Hemoglobin 6.3 (*)    HCT 20.2 (*)    MCV 77.1 (*)    MCH 24.0 (*)    RDW 19.0 (*)    Platelets 521 (*)    Neutrophils Relative % 90 (*)    Neutro Abs 16.8 (*)    Lymphocytes Relative 8 (*)    Monocytes Relative 2 (*)    All other components within normal limits  COMPREHENSIVE METABOLIC PANEL - Abnormal; Notable for the following:    Sodium 131 (*)    Chloride 95 (*)    Glucose, Bld 334 (*)    BUN 26 (*)    Creatinine, Ser 1.59 (*)    Calcium 8.3 (*)    Albumin 3.1 (*)    AST 137 (*)    Alkaline Phosphatase 149 (*)    GFR calc non Af Amer 42 (*)    GFR calc Af Amer 49 (*)    All other components within normal limits  URINALYSIS, ROUTINE W REFLEX MICROSCOPIC - Abnormal; Notable for the following:    Glucose, UA 100 (*)    Hgb urine dipstick TRACE (*)    Protein, ur 30 (*)    All other components within normal limits  I-STAT CG4 LACTIC ACID, ED - Abnormal; Notable for the following:    Lactic Acid, Venous 4.65 (*)    All other components within normal limits  CULTURE, BLOOD (ROUTINE X 2)  CULTURE, BLOOD (ROUTINE X 2)  URINE CULTURE  URINE MICROSCOPIC-ADD ON  POC OCCULT BLOOD, ED  TYPE AND SCREEN    Imaging Review Dg Chest Port 1 View  09/08/2014   CLINICAL DATA:   Cough. History of small bowel cancer, hypertension, and diabetes. Current chemotherapy and radiation. Fever and cough today.  EXAM: PORTABLE CHEST - 1 VIEW  COMPARISON:  PET-CT scan 09/05/2014  FINDINGS: Are port type central venous catheter with tip over the mid SVC region. Normal heart size and pulmonary vascularity. No focal airspace disease or consolidation in the lungs. No blunting of costophrenic angles. No pneumothorax. Mediastinal contours appear intact. Degenerative changes in the spine.  IMPRESSION: No active disease.   Electronically Signed   By: Lucienne Capers M.D.   On: 09/08/2014 00:01     EKG Interpretation None  MDM   Final diagnoses:  Cough   Results for orders placed or performed during the hospital encounter of 09/07/14  CBC WITH DIFFERENTIAL  Result Value Ref Range   WBC 18.6 (H) 4.0 - 10.5 K/uL   RBC 2.62 (L) 4.22 - 5.81 MIL/uL   Hemoglobin 6.3 (LL) 13.0 - 17.0 g/dL   HCT 20.2 (L) 39.0 - 52.0 %   MCV 77.1 (L) 78.0 - 100.0 fL   MCH 24.0 (L) 26.0 - 34.0 pg   MCHC 31.2 30.0 - 36.0 g/dL   RDW 19.0 (H) 11.5 - 15.5 %   Platelets 521 (H) 150 - 400 K/uL   Neutrophils Relative % 90 (H) 43 - 77 %   Neutro Abs 16.8 (H) 1.7 - 7.7 K/uL   Lymphocytes Relative 8 (L) 12 - 46 %   Lymphs Abs 1.4 0.7 - 4.0 K/uL   Monocytes Relative 2 (L) 3 - 12 %   Monocytes Absolute 0.4 0.1 - 1.0 K/uL   Eosinophils Relative 0 0 - 5 %   Eosinophils Absolute 0.0 0.0 - 0.7 K/uL   Basophils Relative 0 0 - 1 %   Basophils Absolute 0.0 0.0 - 0.1 K/uL  Comprehensive metabolic panel  Result Value Ref Range   Sodium 131 (L) 135 - 145 mmol/L   Potassium 4.8 3.5 - 5.1 mmol/L   Chloride 95 (L) 96 - 112 mmol/L   CO2 23 19 - 32 mmol/L   Glucose, Bld 334 (H) 70 - 99 mg/dL   BUN 26 (H) 6 - 23 mg/dL   Creatinine, Ser 1.59 (H) 0.50 - 1.35 mg/dL   Calcium 8.3 (L) 8.4 - 10.5 mg/dL   Total Protein 7.5 6.0 - 8.3 g/dL   Albumin 3.1 (L) 3.5 - 5.2 g/dL   AST 137 (H) 0 - 37 U/L   ALT 39 0 - 53 U/L    Alkaline Phosphatase 149 (H) 39 - 117 U/L   Total Bilirubin 1.0 0.3 - 1.2 mg/dL   GFR calc non Af Amer 42 (L) >90 mL/min   GFR calc Af Amer 49 (L) >90 mL/min   Anion gap 13 5 - 15  Urinalysis with microscopic  Result Value Ref Range   Color, Urine YELLOW YELLOW   APPearance CLEAR CLEAR   Specific Gravity, Urine 1.021 1.005 - 1.030   pH 5.5 5.0 - 8.0   Glucose, UA 100 (A) NEGATIVE mg/dL   Hgb urine dipstick TRACE (A) NEGATIVE   Bilirubin Urine NEGATIVE NEGATIVE   Ketones, ur NEGATIVE NEGATIVE mg/dL   Protein, ur 30 (A) NEGATIVE mg/dL   Urobilinogen, UA 0.2 0.0 - 1.0 mg/dL   Nitrite NEGATIVE NEGATIVE   Leukocytes, UA NEGATIVE NEGATIVE  Urine microscopic-add on  Result Value Ref Range   Squamous Epithelial / LPF RARE RARE   WBC, UA 3-6 <3 WBC/hpf   RBC / HPF 3-6 <3 RBC/hpf   Bacteria, UA RARE RARE  I-Stat CG4 Lactic Acid, ED  Result Value Ref Range   Lactic Acid, Venous 4.65 (HH) 0.5 - 2.0 mmol/L   Comment NOTIFIED PHYSICIAN    Ct Abdomen Pelvis Wo Contrast  08/18/2014   CLINICAL DATA:  Chronic fatigue and cough. Feeling tired for the past 3-4 weeks. Positive hemoccult. Decreased hemoglobin. Initial encounter.  EXAM: CT ABDOMEN AND PELVIS WITHOUT CONTRAST  TECHNIQUE: Multidetector CT imaging of the abdomen and pelvis was performed following the standard protocol without IV contrast.  COMPARISON:  None.  FINDINGS: The visualized lung bases are clear.  Multiple large masses are seen within the liver, bulky in appearance. The largest of these measures perhaps 5.7 cm, though all of the masses are relatively large. This is unusual for a primary hepatic malignancy, though it remains a possibility. Metastatic disease is a concern. The spleen is unremarkable.  Stones are noted within the gallbladder; the gallbladder is otherwise unremarkable in appearance. The pancreas and adrenal glands are within normal limits.  There appears to be a focal filling defect within the proximal jejunum, measuring  approximately 2.5 x 1.7 cm, with surrounding contrast. There is mild adjacent jejunal wall thickening. Malignancy cannot be excluded. Capsule endoscopy could be considered for further evaluation, as deemed clinically appropriate.  Mild nonspecific perinephric stranding is noted bilaterally. The kidneys are otherwise unremarkable. There is no evidence of hydronephrosis. No renal or ureteral stones are seen.  No free fluid is identified. The small bowel is unremarkable in appearance. The stomach is within normal limits. No acute vascular abnormalities are seen.  The appendix is normal in caliber, without evidence of appendicitis. Scattered diverticulosis is noted along the descending and proximal sigmoid colon, without evidence of diverticulitis. The colon is otherwise unremarkable.  The bladder is decompressed and not well assessed. The prostate is enlarged, measuring 5.3 cm in transverse dimension. No inguinal lymphadenopathy is seen.  No acute osseous abnormalities are identified.  IMPRESSION: 1. Multiple large masses noted within the liver, bulky in appearance. These measure up to 5.7 cm in size; all of the masses are relatively large. This is unusual for primary hepatic malignancy, though it remains a possibility. Metastatic disease is a concern. Would correlate with LFTs; these lesions may be the most amenable to biopsy. 2. Focal filling defect within the proximal jejunum, measuring 2.5 x 1.7 cm, with surrounding contrast. Mild adjacent jejunal wall thickening. Malignancy cannot be excluded. Capsule endoscopy could be considered for further evaluation, as deemed clinically appropriate. 3. Scattered diverticulosis along the descending and proximal sigmoid colon, without evidence of diverticulitis. 4. Enlarged prostate noted. 5. Mild cholelithiasis noted; gallbladder otherwise unremarkable.   Electronically Signed   By: Garald Balding M.D.   On: 08/18/2014 01:16   Nm Pet Image Initial (pi) Skull Base To  Thigh  09/05/2014   CLINICAL DATA:  Initial treatment strategy for small bowel carcinoma.  EXAM: NUCLEAR MEDICINE PET SKULL BASE TO THIGH  TECHNIQUE: 11.0 mCi F-18 FDG was injected intravenously. Full-ring PET imaging was performed from the skull base to thigh after the radiotracer. CT data was obtained and used for attenuation correction and anatomic localization.  FASTING BLOOD GLUCOSE:  Value: 135 mg/dl  COMPARISON:  Noncontrast AP CT on 08/18/2014  FINDINGS: NECK  No hypermetabolic lymph nodes in the neck.  CHEST  No hypermetabolic mediastinal or hilar nodes. No suspicious pulmonary nodules on the CT scan.  ABDOMEN/PELVIS  Multiple hypermetabolic liver metastases are seen involving both the right and left lobes diffusely. Index metastasis in the lateral segment left lobe has an SUV max of 13.3.  Hypermetabolic mass involving a small bowel loop with adjacent mesenteric lymphadenopathy is seen within the abdomen just to the left of midline. This mass and adjacent lymphadenopathy to gather measures approximately 3.7 x 4.7 cm on image 127/series 4. This is hypermetabolic with SUV max of 46.2. No evidence of small bowel obstruction.  Mild hypermetabolic lymphadenopathy is seen in the porta hepatis, with SUV max of 3.2, suspicious for metastatic lymphadenopathy.  Incidental note is made of tiny calcified gallstones, without evidence of cholecystitis. Colonic diverticulosis  is also noted without evidence of diverticulitis or other acute inflammatory process. Mildly enlarged prostate noted, without focal hypermetabolic activity.  SKELETON  No focal hypermetabolic activity to suggest skeletal metastasis.  IMPRESSION: Hypermetabolic mass involving a small bowel loop with adjacent mesenteric lymphadenopathy in the left abdomen, consistent with known primary small bowel carcinoma. No evidence of small bowel obstruction.  Diffuse liver metastases. Mild hypermetabolic lymphadenopathy in porta hepatis, also suspicious for  metastatic disease.  No evidence of metastatic disease within the neck, chest, or pelvis.  Incidental findings including cholelithiasis, diverticulosis, and mildly enlarged prostate.   Electronically Signed   By: Earle Gell M.D.   On: 09/05/2014 17:29   US Biopsy  08/28/2014   CLINICAL DATA:  Multiple liver lesions.  Small bowel lesion.  EXAM: ULTRASOUND-GUIDED BIOPSY OF A RIGHT LOBE LIVER LESION.  CORE.  MEDICATIONS AND MEDICAL HISTORY: Versed 1 mg, Fentanyl 50 mcg.  Additional Medications: None.  ANESTHESIA/SEDATION: Moderate sedation time: 20 minutes  PROCEDURE: The procedure, risks, benefits, and alternatives were explained to the patient. Questions regarding the procedure were encouraged and answered. The patient understands and consents to the procedure.  The right flank was prepped with Betadine in a sterile fashion, and a sterile drape was applied covering the operative field. A sterile gown and sterile gloves were used for the procedure.  Under sonographic guidance, an 17gauge guide needle was advanced into the right lobe liver lesion. Subsequently 3 18 gauge core biopsies were obtained. Gel-Foam slurry was injected into the tract with guide needle removal. Final imaging was performed.  Patient tolerated the procedure well without complication. Vital sign monitoring by nursing staff during the procedure will continue as patient is in the special procedures unit for post procedure observation.  FINDINGS: The images document guide needle placement within the right lobe liver lesion. Post biopsy images demonstrateno hemorrhage.  COMPLICATIONS: None.  IMPRESSION: Successful ultrasound-guided core biopsy of a right lobe liver lesion.   Electronically Signed   By: Marybelle Killings M.D.   On: 08/28/2014 17:12   Ir Fluoro Guide Cv Line Right  08/28/2014   CLINICAL DATA:  Small bowel adenocarcinoma  EXAM: TUNNEL POWER PORT PLACEMENT WITH SUBCUTANEOUS POCKET UTILIZING ULTRASOUND & FLOUROSCOPY  FLUOROSCOPY TIME:   48 seconds.  MEDICATIONS AND MEDICAL HISTORY: Versed 1.5 mg, Fentanyl 75 mcg.  As antibiotic prophylaxis, Ancef was ordered pre-procedure and administered intravenously within one hour of incision.  ANESTHESIA/SEDATION: Moderate sedation time: 30 minutes  CONTRAST:  None  PROCEDURE: After written informed consent was obtained, patient was placed in the supine position on angiographic table. The right neck and chest was prepped and draped in a sterile fashion. Lidocaine was utilized for local anesthesia. The right internal jugular vein was noted to be patent initially with ultrasound. Under sonographic guidance, a micropuncture needle was inserted into the right IJ vein (Ultrasound and fluoroscopic image documentation was performed). The needle was removed over an 018 wire which was exchanged for a Amplatz. This was advanced into the IVC. An 8-French dilator was advanced over the Amplatz.  A small incision was made in the right upper chest over the anterior right second rib. Utilizing blunt dissection, a subcutaneous pocket was created in the caudal direction. The pocket was irrigated with a copious amount of sterile normal saline. The port catheter was tunneled from the chest incision, and out the neck incision. The reservoir was inserted into the subcutaneous pocket and secured with two 3-0 Ethilon stitches. A peel-away sheath was advanced over the  Amplatz wire. The port catheter was cut to measure length and inserted through the peel-away sheath. The peel-away sheath was removed. The chest incision was closed with 3-0 Vicryl interrupted stitches for the subcutaneous tissue and a running of 4-0 Vicryl subcuticular stitch for the skin. The neck incision was closed with a 4-0 Vicryl subcuticular stitch. Derma-bond was applied to both surgical incisions. The port reservoir was flushed and instilled with heparinized saline. No complications.  FINDINGS: A right IJ vein Port-A-Cath is in place with its tip at the  cavoatrial junction.  COMPLICATIONS: None  IMPRESSION: Successful 8 French right internal jugular vein power port placement with its tip at the SVC/RA junction.   Electronically Signed   By: Marybelle Killings M.D.   On: 08/28/2014 17:06   Ir US Guide Vasc Access Right  08/28/2014   CLINICAL DATA:  Small bowel adenocarcinoma  EXAM: TUNNEL POWER PORT PLACEMENT WITH SUBCUTANEOUS POCKET UTILIZING ULTRASOUND & FLOUROSCOPY  FLUOROSCOPY TIME:  48 seconds.  MEDICATIONS AND MEDICAL HISTORY: Versed 1.5 mg, Fentanyl 75 mcg.  As antibiotic prophylaxis, Ancef was ordered pre-procedure and administered intravenously within one hour of incision.  ANESTHESIA/SEDATION: Moderate sedation time: 30 minutes  CONTRAST:  None  PROCEDURE: After written informed consent was obtained, patient was placed in the supine position on angiographic table. The right neck and chest was prepped and draped in a sterile fashion. Lidocaine was utilized for local anesthesia. The right internal jugular vein was noted to be patent initially with ultrasound. Under sonographic guidance, a micropuncture needle was inserted into the right IJ vein (Ultrasound and fluoroscopic image documentation was performed). The needle was removed over an 018 wire which was exchanged for a Amplatz. This was advanced into the IVC. An 8-French dilator was advanced over the Amplatz.  A small incision was made in the right upper chest over the anterior right second rib. Utilizing blunt dissection, a subcutaneous pocket was created in the caudal direction. The pocket was irrigated with a copious amount of sterile normal saline. The port catheter was tunneled from the chest incision, and out the neck incision. The reservoir was inserted into the subcutaneous pocket and secured with two 3-0 Ethilon stitches. A peel-away sheath was advanced over the Amplatz wire. The port catheter was cut to measure length and inserted through the peel-away sheath. The peel-away sheath was removed. The  chest incision was closed with 3-0 Vicryl interrupted stitches for the subcutaneous tissue and a running of 4-0 Vicryl subcuticular stitch for the skin. The neck incision was closed with a 4-0 Vicryl subcuticular stitch. Derma-bond was applied to both surgical incisions. The port reservoir was flushed and instilled with heparinized saline. No complications.  FINDINGS: A right IJ vein Port-A-Cath is in place with its tip at the cavoatrial junction.  COMPLICATIONS: None  IMPRESSION: Successful 8 French right internal jugular vein power port placement with its tip at the SVC/RA junction.   Electronically Signed   By: Marybelle Killings M.D.   On: 08/28/2014 17:06   Dg Chest Port 1 View  09/08/2014   CLINICAL DATA:  Cough. History of small bowel cancer, hypertension, and diabetes. Current chemotherapy and radiation. Fever and cough today.  EXAM: PORTABLE CHEST - 1 VIEW  COMPARISON:  PET-CT scan 09/05/2014  FINDINGS: Are port type central venous catheter with tip over the mid SVC region. Normal heart size and pulmonary vascularity. No focal airspace disease or consolidation in the lungs. No blunting of costophrenic angles. No pneumothorax. Mediastinal contours appear intact. Degenerative  changes in the spine.  IMPRESSION: No active disease.   Electronically Signed   By: Lucienne Capers M.D.   On: 09/08/2014 00:01    Medications  vancomycin (VANCOCIN) IVPB 1000 mg/200 mL premix (not administered)  levofloxacin (LEVAQUIN) IVPB 750 mg (not administered)  sodium chloride 0.9 % bolus 1,000 mL (not administered)  0.9 %  sodium chloride infusion (not administered)     Dolton Shaker, MD 09/08/14 0159

## 2014-09-08 NOTE — Progress Notes (Signed)
INITIAL NUTRITION ASSESSMENT  DOCUMENTATION CODES Per approved criteria  -Obesity Unspecified   INTERVENTION: Provide Glucerna Shake po TID, each supplement provides 220 kcal and 10 grams of protein Encourage small frequent meals RD to continue to monitor  NUTRITION DIAGNOSIS: Increased nutrient needs related to cancer and associated treatments as evidenced by estimated nutritional needs.   Goal: Pt to meet >/= 90% of their estimated nutrition needs   Monitor:  PO and supplemental intake, weight, labs, I/O's  Reason for Assessment: Pt identified as at nutrition risk on the Malnutrition Screen Tool  Admitting Dx: SIRS (systemic inflammatory response syndrome)  ASSESSMENT: 69 y.o. male the PMH of small bowel cancer with liver metastasis diagnosed 08/2014, currently receiving systemic palliative chemotherapy with FOLFOX, who was admitted 09/07/14 with a fever of 101.1 and ongoing cough.  Pt reports poor appetite PTA but was trying to eat small frequent meals throughout the day. Pt would snack on graham crackers, fruit and cheese and crackers. RD encouraged pt to continue the small frequent meals but emphasized including protein with each snack.  Pt is willing to try Ensure supplements and RD provided one to pt to try. Pt states that d/t chemo he is sensitive to cold temperatures. Supplements should be room temperature. Based on pt's CBGs, pt would benefit better from [facility]. RD to order.  Nutrition focused physical exam shows no sign of depletion of muscle mass or body fat.  Labs reviewed: Low Na Elevated BUN & Creatinine CBGs: 203-311  Height: Ht Readings from Last 1 Encounters:  09/08/14 5\' 6"  (1.676 m)    Weight: Wt Readings from Last 1 Encounters:  09/08/14 214 lb (97.07 kg)    Ideal Body Weight: 142 lb  % Ideal Body Weight: 151%  Wt Readings from Last 10 Encounters:  09/08/14 214 lb (97.07 kg)  09/06/14 213 lb 9.6 oz (96.888 kg)  08/28/14 210 lb (95.255  kg)  08/23/14 215 lb 11.2 oz (97.841 kg)  08/18/14 213 lb 10 oz (96.9 kg)    Usual Body Weight: 229 lb -per pt  % Usual Body Weight: 93%  BMI:  Body mass index is 34.56 kg/(m^2).  Estimated Nutritional Needs: Kcal: 2100-2300 Protein: 95-105g Fluid: 2.1L/day  Skin: chest and abdominal incisions  Diet Order: Diet Carb Modified Fluid consistency:: Thin; Room service appropriate?: Yes  EDUCATION NEEDS: -Education needs addressed   Intake/Output Summary (Last 24 hours) at 09/08/14 1459 Last data filed at 09/08/14 1400  Gross per 24 hour  Intake    760 ml  Output    950 ml  Net   -190 ml    Last BM: 4/7  Labs:   Recent Labs Lab 09/08/14 0014  NA 131*  K 4.8  CL 95*  CO2 23  BUN 26*  CREATININE 1.59*  CALCIUM 8.3*  GLUCOSE 334*    CBG (last 3)   Recent Labs  09/08/14 0737 09/08/14 1153  GLUCAP 311* 203*    Scheduled Meds: . atorvastatin  20 mg Oral Daily  . insulin aspart  0-15 Units Subcutaneous TID WC  . insulin glargine  10 Units Subcutaneous QHS  . [START ON 09/09/2014] levofloxacin (LEVAQUIN) IV  750 mg Intravenous Q48H  . lisinopril  20 mg Oral QHS  . ondansetron  8 mg Oral BID  . pantoprazole  40 mg Oral Daily  . vancomycin  750 mg Intravenous Q12H    Continuous Infusions: . sodium chloride      Past Medical History  Diagnosis Date  .  Diabetes mellitus without complication   . Hypertension   . Small bowel cancer 08/18/2014    Past Surgical History  Procedure Laterality Date  . Enteroscopy N/A 08/18/2014    Procedure: ENTEROSCOPY;  Surgeon: Carol Ada, MD;  Location: [facility];  Service: Endoscopy;  Laterality: N/A;    [name] Bibles, MS, RD, LDN Pager: [phone] After Hours Pager: [phone]

## 2014-09-08 NOTE — Progress Notes (Signed)
ANTIBIOTIC CONSULT NOTE - INITIAL  Pharmacy Consult for Levaquin and Vancomycin Indication: Sepsis  Allergies  Allergen Reactions  . Penicillins Nausea And Vomiting    Patient Measurements: Height: 5\' 6"  (167.6 cm) Weight: 210 lb (95.255 kg) IBW/kg (Calculated) : 63.8   Vital Signs: Temp: 99.3 F (37.4 C) (04/07 2326) Temp Source: Oral (04/07 2326) BP: 114/45 mmHg (04/08 0300) Pulse Rate: 112 (04/08 0315) Intake/Output from previous day:   Intake/Output from this shift:    Labs:  Recent Labs  09/08/14 0014  WBC 18.6*  HGB 6.3*  PLT 521*  CREATININE 1.59*   Estimated Creatinine Clearance: 46 mL/min (by C-G formula based on Cr of 1.59). No results for input(s): VANCOTROUGH, VANCOPEAK, VANCORANDOM, GENTTROUGH, GENTPEAK, GENTRANDOM, TOBRATROUGH, TOBRAPEAK, TOBRARND, AMIKACINPEAK, AMIKACINTROU, AMIKACIN in the last 72 hours.   Microbiology: Recent Results (from the past 720 hour(s))  MRSA PCR Screening     Status: None   Collection Time: 08/18/14 12:45 AM  Result Value Ref Range Status   MRSA by PCR NEGATIVE NEGATIVE Final    Comment:        The GeneXpert MRSA Assay (FDA approved for NASAL specimens only), is one component of a comprehensive MRSA colonization surveillance program. It is not intended to diagnose MRSA infection nor to guide or monitor treatment for MRSA infections.     Medical History: Past Medical History  Diagnosis Date  . Diabetes mellitus without complication   . Hypertension   . Small bowel cancer 08/18/2014    Medications:   (Not in a hospital admission) Scheduled:  . atorvastatin  20 mg Oral Daily  . insulin aspart  0-15 Units Subcutaneous TID WC  . insulin glargine  10 Units Subcutaneous QHS  . lidocaine-prilocaine   Topical Once  . lisinopril  20 mg Oral QHS  . ondansetron  8 mg Oral BID  . pantoprazole  40 mg Oral Daily  . vancomycin  750 mg Intravenous Q12H   Infusions:  . sodium chloride 125 mL/hr at 09/08/14 0200   . sodium chloride    . [START ON 09/09/2014] levofloxacin (LEVAQUIN) IV     Assessment: 39 yoM with small bowel cancer, undergoing chemotherapy with a continuous chemo pump in place. Patient presents to the ED with 1 day history of fever to 101.1 at home. Has had ongoing cough for a number of weeks but fever is new today. Levaquin and Vancomycin per Rx for Sepsis.   Goal of Therapy:  Vancomycin trough level 15-20 mcg/ml  Plan:   Levaquin 750mg  IV q48h   Vancomycin 750mg  IV q12h  F/u SCr/cultures/levels  Dorrene German 09/08/2014,4:45 AM

## 2014-09-08 NOTE — Telephone Encounter (Signed)
Received message earlier through Wilma/Registration that wife called & states that pt is in hosp. Message to Dr Burr Medico.

## 2014-09-08 NOTE — Progress Notes (Signed)
Progress Note   KESHAN REHA OFB:[id number] DOB: July 20, 1945 DOA: 09/07/2014 PCP: Kandice Hams, MD   Brief Narrative:   Tyler Evans is an 69 y.o. male the PMH of small bowel cancer with liver metastasis diagnosed 08/2014, currently receiving systemic palliative chemotherapy with FOLFOX, under the care of Dr. Burr Medico, who was admitted 09/07/14 with a fever of 101.1 and ongoing cough. Chest x-ray was negative for active lung disease. Serum lactate was elevated. Admission vital signs showed a blood pressure 1:30/48, pulse 108, temperature 99.3 and respiratory rate of 25. Pulse oximetry was 100%. WBC was 18.6, hemoglobin 6.3.  Assessment/Plan:   Principal Problem:   SIRS (systemic inflammatory response syndrome) - Concern for sepsis, but unclear source. - Placed on empiric Levaquin and vancomycin. - Hemodynamically stable with low-grade fevers. - Lactic acid beginning to decline. - Follow-up blood and urine cultures. - Transfer out of SDU.  Active Problems:   Anemia - Multi factorial with probable GI losses in the setting of known small bowel cancer and bone marrow suppression from anti-neoplastic chemotherapy. - 2 units of PRBCs ordered.    AKI (acute kidney injury) - Likely prerenal in etiology. Current creatinine 1.59. - Baseline creatinine 1.2. - Continue to hydrate.    Diabetes mellitus without complication - Blood glucoses are elevated, anion gap 13. - Continue Lantus 10 units daily. Continue moderate scale SSI.  Glipizide on hold. - Hemoglobin A1c 7.7% when checked 08/17/14.    Small bowel cancer - Status post 1 cycle of FOLFOX chemotherapy.    DVT Prophylaxis - SCDs ordered.  Code Status: Full. Family Communication: No family at the bedside. Disposition Plan: Home when stable.   IV Access:    Porta-Cath   Procedures and diagnostic studies:   Dg Chest Port 1 View  2014/09/12   CLINICAL DATA:  Cough. History of small bowel cancer, hypertension, and  diabetes. Current chemotherapy and radiation. Fever and cough today.  EXAM: PORTABLE CHEST - 1 VIEW  COMPARISON:  PET-CT scan 09/05/2014  FINDINGS: Are port type central venous catheter with tip over the mid SVC region. Normal heart size and pulmonary vascularity. No focal airspace disease or consolidation in the lungs. No blunting of costophrenic angles. No pneumothorax. Mediastinal contours appear intact. Degenerative changes in the spine.  IMPRESSION: No active disease.   Electronically Signed   By: Lucienne Capers M.D.   On: 2014/09/12 00:01     Medical Consultants:    None.  Anti-Infectives:    Levaquin 09/07/14--->  Vancomycin 09/07/14--->  Subjective:   [name] reports a dry cough and headache, but denies dyspnea, N/V/D, abdominal pain.    Objective:    Filed Vitals:   Sep 12, 2014 0524 2014-09-12 0542 2014-09-12 0615 2014/09/12 0647  BP: 131/40 131/40 124/50 113/38  Pulse:  104    Temp:  99.5 F (37.5 C) 99.9 F (37.7 C) 99.1 F (37.3 C)  TempSrc:  Oral Oral Oral  Resp: 24 21    Height:      Weight:      SpO2: 96% 95%      Intake/Output Summary (Last 24 hours) at 09/12/2014 0725 Last data filed at 09-12-14 0647  Gross per 24 hour  Intake    330 ml  Output      0 ml  Net    330 ml    Exam: Gen:  NAD Cardiovascular:  RRR, No M/R/G Respiratory:  Lungs CTAB Gastrointestinal:  Abdomen soft, NT/ND, + BS Extremities:  No  C/E/C   Data Reviewed:    Labs: Basic Metabolic Panel:  Recent Labs Lab 09/08/14 0014  NA 131*  K 4.8  CL 95*  CO2 23  GLUCOSE 334*  BUN 26*  CREATININE 1.59*  CALCIUM 8.3*   GFR Estimated Creatinine Clearance: 46.5 mL/min (by C-G formula based on Cr of 1.59). Liver Function Tests:  Recent Labs Lab 09/08/14 0014  AST 137*  ALT 39  ALKPHOS 149*  BILITOT 1.0  PROT 7.5  ALBUMIN 3.1*   CBC:  Recent Labs Lab 09/08/14 0014  WBC 18.6*  NEUTROABS 16.8*  HGB 6.3*  HCT 20.2*  MCV 77.1*  PLT 521*   CBG:  Recent  Labs Lab 09/05/14 1342  GLUCAP 135*   Sepsis Labs:  Recent Labs Lab 09/08/14 0014 09/08/14 0021 09/08/14 0444  WBC 18.6*  --   --   LATICACIDVEN  --  4.65* 3.2*   Microbiology No results found for this or any previous visit (from the past 240 hour(s)).   Medications:   . sodium chloride   Intravenous Once  . atorvastatin  20 mg Oral Daily  . insulin aspart  0-15 Units Subcutaneous TID WC  . insulin glargine  10 Units Subcutaneous QHS  . [START ON 09/09/2014] levofloxacin (LEVAQUIN) IV  750 mg Intravenous Q48H  . lisinopril  20 mg Oral QHS  . ondansetron  8 mg Oral BID  . pantoprazole  40 mg Oral Daily  . vancomycin  750 mg Intravenous Q12H   Continuous Infusions: . sodium chloride 125 mL/hr at 09/08/14 0200    Time spent: 30 minutes.   LOS: 0 days   Marty Uy  [facility] Pager [phone]. If unable to reach me by pager, please call my cell phone at [phone].  *Please refer to [email or web address], password [email or web address] to get updated schedule on who will round on this patient, as hospitalists switch teams weekly. If 7PM-7AM, please contact night-coverage at [email or web address], password [email or web address] for any overnight needs.  09/08/2014, 7:25 AM

## 2014-09-08 NOTE — ED Notes (Signed)
Gave patient I Stat Lactic results to MD Palumbo

## 2014-09-08 NOTE — Progress Notes (Signed)
Tyler Evans   DOB:1946/01/07   XK#:[id number]   HFW#:[id number]  Subjective: Pt is well known to me. He was admitted for fever after chemo, and anemia required blood transfusion. No overt GI bleeding. Feels better today, afebrile.    Objective:  Filed Vitals:   09/08/14 2230  BP: 123/39  Pulse: 86  Temp: 99.3 F (37.4 C)  Resp: 18    Body mass index is 34.56 kg/(m^2).  Intake/Output Summary (Last 24 hours) at 09/08/14 2351 Last data filed at 09/08/14 1800  Gross per 24 hour  Intake   2080 ml  Output   1575 ml  Net    505 ml     Sclerae unicteric  Oropharynx clear  No peripheral adenopathy  Lungs clear -- no rales or rhonchi  Heart regular rate and rhythm  Abdomen benign  MSK no focal spinal tenderness, no peripheral edema  Neuro nonfocal   CBG (last 3)   Recent Labs  09/08/14 1153 09/08/14 1702 09/08/14 2133  GLUCAP 203* 337* 398*     Labs:  Lab Results  Component Value Date   WBC 18.6* 09/08/2014   HGB 6.3* 09/08/2014   HCT 20.2* 09/08/2014   MCV 77.1* 09/08/2014   PLT 521* 09/08/2014   NEUTROABS 16.8* 09/08/2014    @LASTCHEMISTRY @  Urine Studies No results for input(s): UHGB, CRYS in the last 72 hours.  Invalid input(s): UACOL, UAPR, USPG, UPH, UTP, UGL, UKET, UBIL, UNIT, UROB, ULEU, UEPI, UWBC, URBC, UBAC, CAST, UCOM, BILUA  Basic Metabolic Panel:  Recent Labs Lab 09/08/14 0014  NA 131*  K 4.8  CL 95*  CO2 23  GLUCOSE 334*  BUN 26*  CREATININE 1.59*  CALCIUM 8.3*   GFR Estimated Creatinine Clearance: 46.5 mL/min (by C-G formula based on Cr of 1.59). Liver Function Tests:  Recent Labs Lab 09/08/14 0014  AST 137*  ALT 39  ALKPHOS 149*  BILITOT 1.0  PROT 7.5  ALBUMIN 3.1*   No results for input(s): LIPASE, AMYLASE in the last 168 hours. No results for input(s): AMMONIA in the last 168 hours. Coagulation profile No results for input(s): INR, PROTIME in the last 168 hours.  CBC:  Recent Labs Lab 09/08/14 0014  WBC  18.6*  NEUTROABS 16.8*  HGB 6.3*  HCT 20.2*  MCV 77.1*  PLT 521*   Cardiac Enzymes: No results for input(s): CKTOTAL, CKMB, CKMBINDEX, TROPONINI in the last 168 hours. BNP: Invalid input(s): POCBNP CBG:  Recent Labs Lab 09/05/14 1342 09/08/14 0737 09/08/14 1153 09/08/14 1702 09/08/14 2133  GLUCAP 135* 311* 203* 337* 398*   D-Dimer No results for input(s): DDIMER in the last 72 hours. Hgb A1c No results for input(s): HGBA1C in the last 72 hours. Lipid Profile No results for input(s): CHOL, HDL, LDLCALC, TRIG, CHOLHDL, LDLDIRECT in the last 72 hours. Thyroid function studies No results for input(s): TSH, T4TOTAL, T3FREE, THYROIDAB in the last 72 hours.  Invalid input(s): FREET3 Anemia work up No results for input(s): VITAMINB12, FOLATE, FERRITIN, TIBC, IRON, RETICCTPCT in the last 72 hours. Microbiology Recent Results (from the past 240 hour(s))  MRSA PCR Screening     Status: None   Collection Time: 09/08/14  5:45 AM  Result Value Ref Range Status   MRSA by PCR NEGATIVE NEGATIVE Final    Comment:        The GeneXpert MRSA Assay (FDA approved for NASAL specimens only), is one component of a comprehensive MRSA colonization surveillance program. It is not intended to diagnose MRSA infection  nor to guide or monitor treatment for MRSA infections.       Studies:  Dg Chest Port 1 View  09/08/2014   CLINICAL DATA:  Cough. History of small bowel cancer, hypertension, and diabetes. Current chemotherapy and radiation. Fever and cough today.  EXAM: PORTABLE CHEST - 1 VIEW  COMPARISON:  PET-CT scan 09/05/2014  FINDINGS: Are port type central venous catheter with tip over the mid SVC region. Normal heart size and pulmonary vascularity. No focal airspace disease or consolidation in the lungs. No blunting of costophrenic angles. No pneumothorax. Mediastinal contours appear intact. Degenerative changes in the spine.  IMPRESSION: No active disease.   Electronically Signed   By:  Lucienne Capers M.D.   On: 09/08/2014 00:01    Assessment: 69 y.o. 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  1. Fever, possibly infection 2. Small bowel adenocarcinoma with liver metastasis 3. Anemia, secondary to GI bleeding from tumor, anemia of iron deficiency 4. AKI 5. DM  Plan -Agree with antibiotics and blood transfusion -Follow-up his blood cultures -If his GI bleeding does not stop after chemo, he may need surgery or radiation  -He is also scheduled for iv Feraheme next few weeks -he will follow up closely in my clinic    Truitt Merle, MD 09/08/2014  11:51 PM

## 2014-09-08 NOTE — H&P (Signed)
[facility] History and Physical  SABRE LEONETTI GNF:[id number] DOB: 25-Sep-1945 DOA: 09/07/2014  Referring physician: EDP PCP: Kandice Hams, MD   Chief Complaint: Fever   HPI: Tyler Evans is a 69 y.o. male with small bowel cancer, undergoing chemotherapy with a continuous chemo pump in place.  Patient presents to the ED with 1 day history of fever to 101.1 at home.  Has had ongoing cough for a number of weeks but fever is new today.  No chest pain, minimal abdominal pain that has been ongoing for weeks.  No rash, no dysuria, no diarrhea, no sore throat, no URI symptoms.  Review of Systems: Systems reviewed.  As above, otherwise negative  Past Medical History  Diagnosis Date  . Diabetes mellitus without complication   . Hypertension   . 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 Enteroscopy N/A 08/18/2014    Procedure: ENTEROSCOPY;  Surgeon: Carol Ada, MD;  Location: [facility];  Service: Endoscopy;  Laterality: N/A;   Social History:  reports that he has never smoked. He does not have any smokeless tobacco history on file. He reports that he does not drink alcohol. His drug history is not on file.  Allergies  Allergen Reactions  . Penicillins Nausea And Vomiting    Family History  Problem Relation Age of Onset  . Heart failure Mother   . Cancer Father   . Cancer Brother      Prior to Admission medications   Medication Sig Start Date End Date Taking? Authorizing Provider  atorvastatin (LIPITOR) 20 MG tablet Take 20 mg by mouth daily.   Yes Historical Provider, MD  glipiZIDE (GLUCOTROL) 10 MG tablet Take 10 mg by mouth daily before breakfast.    Yes Historical Provider, MD  ondansetron (ZOFRAN) 8 MG tablet Take 1 tablet (8 mg total) by mouth 2 (two) times daily. Start the day after chemo for 2 days. Then take as needed for nausea or vomiting. 09/04/14  Yes Truitt Merle, MD  pantoprazole (PROTONIX) 40 MG tablet Take 1  tablet (40 mg total) by mouth daily. 08/19/14  Yes Robbie Lis, MD  PRESCRIPTION MEDICATION See admin instructions. Receives Oxaliplatin, Leucovorin, and Adrucil chemotherapy every 2 weeks.   Yes Historical Provider, MD  insulin glargine (LANTUS) 100 UNIT/ML injection Inject 20 Units into the skin at bedtime.     Historical Provider, MD  lidocaine-prilocaine (EMLA) cream Apply to affected area once 09/04/14   Truitt Merle, MD  lisinopril (PRINIVIL,ZESTRIL) 40 MG tablet Take 20 mg by mouth at bedtime.     Historical Provider, MD   Physical Exam: Filed Vitals:   09/08/14 0139  BP: 130/48  Pulse: 108  Temp:   Resp: 25    BP 130/48 mmHg  Pulse 108  Temp(Src) 99.3 F (37.4 C) (Oral)  Resp 25  Ht 5\' 6"  (1.676 m)  Wt 95.255 kg (210 lb)  BMI 33.91 kg/m2  SpO2 100%  General Appearance:    Alert, oriented, no distress, appears stated age  Head:    Normocephalic, atraumatic  Eyes:    PERRL, EOMI, sclera non-icteric        Nose:   Nares without drainage or epistaxis. Mucosa, turbinates normal  Throat:   Moist mucous membranes. Oropharynx without erythema or exudate.  Neck:   Supple. No carotid bruits.  No thyromegaly.  No lymphadenopathy.   Back:     No CVA tenderness, no spinal tenderness  Lungs:  Clear to auscultation bilaterally, without wheezes, rhonchi or rales  Chest wall:    No tenderness to palpitation  Heart:    Regular rate and rhythm without murmurs, gallops, rubs  Abdomen:     Soft, non-tender, nondistended, normal bowel sounds, no organomegaly  Genitalia:    deferred  Rectal:    deferred  Extremities:   No clubbing, cyanosis or edema.  Pulses:   2+ and symmetric all extremities  Skin:   Skin color, texture, turgor normal, no rashes or lesions  Lymph nodes:   Cervical, supraclavicular, and axillary nodes normal  Neurologic:   CNII-XII intact. Normal strength, sensation and reflexes      throughout    Labs on Admission:  Basic Metabolic Panel:  Recent Labs Lab  09/08/14 0014  NA 131*  K 4.8  CL 95*  CO2 23  GLUCOSE 334*  BUN 26*  CREATININE 1.59*  CALCIUM 8.3*   Liver Function Tests:  Recent Labs Lab 09/08/14 0014  AST 137*  ALT 39  ALKPHOS 149*  BILITOT 1.0  PROT 7.5  ALBUMIN 3.1*   No results for input(s): LIPASE, AMYLASE in the last 168 hours. No results for input(s): AMMONIA in the last 168 hours. CBC:  Recent Labs Lab 09/08/14 0014  WBC 18.6*  NEUTROABS 16.8*  HGB 6.3*  HCT 20.2*  MCV 77.1*  PLT 521*   Cardiac Enzymes: No results for input(s): CKTOTAL, CKMB, CKMBINDEX, TROPONINI in the last 168 hours.  BNP (last 3 results) No results for input(s): PROBNP in the last 8760 hours. CBG:  Recent Labs Lab 09/05/14 1342  GLUCAP 135*    Radiological Exams on Admission: Dg Chest Port 1 View  09/08/2014   CLINICAL DATA:  Cough. History of small bowel cancer, hypertension, and diabetes. Current chemotherapy and radiation. Fever and cough today.  EXAM: PORTABLE CHEST - 1 VIEW  COMPARISON:  PET-CT scan 09/05/2014  FINDINGS: Are port type central venous catheter with tip over the mid SVC region. Normal heart size and pulmonary vascularity. No focal airspace disease or consolidation in the lungs. No blunting of costophrenic angles. No pneumothorax. Mediastinal contours appear intact. Degenerative changes in the spine.  IMPRESSION: No active disease.   Electronically Signed   By: Lucienne Capers M.D.   On: 09/08/2014 00:01    EKG: Independently reviewed.  Assessment/Plan Principal Problem:   SIRS (systemic inflammatory response syndrome) Active Problems:   Anemia   AKI (acute kidney injury)   Diabetes mellitus without complication   Small bowel cancer   1. SIRS - Patient with fever, tachycardia, leukocytosis, unclear source of infection at this point. 1. Cultures pending 2. Empiric levaquin (PCN allergy) and vanc 3. IVF hydration 4. Follow leukocytosis 2. Anemia - HGB of 6.8, bone marrow  suppression? 1. Transfusing 2 units PRBC 2. No evidence of fast GIB (patient is actually constipated he says). 3. Lactic acidosis - repeat lactate q3h 4. DM2 - lantus 10 units daily, adding mod dose SSI AC/HS   Code Status: Full Code  Family Communication: Family at bedside Disposition Plan: Admit to SDU   Time spent: 70 min  GARDNER, JARED M. [facility] Pager [phone]  If 7AM-7PM, please contact the day team taking care of the patient [email or web address] Password [facility] 09/08/2014, 3:14 AM

## 2014-09-08 NOTE — ED Notes (Signed)
Spoke with Clarise Cruz RN on 3W and she kindly agreed to come and discontinue chemo medication.

## 2014-09-08 NOTE — ED Notes (Signed)
Called lab to inquire about results, spoke to [place] who says she will release the results now.

## 2014-09-08 NOTE — ED Notes (Signed)
Pt refused to have blood drawn, sts he is a hard stick and it will be very painful, unsuccessful attempt to draw from peripheral IV line,  pt is requesting blood to be drawn from [facility], however chemo is still hooked up. Will call 3rd floor to inquire about removing chemo. Pamala Hurry RN on 2W updated about delay.

## 2014-09-08 NOTE — ED Notes (Signed)
Chemo RN at bedside

## 2014-09-08 NOTE — ED Provider Notes (Signed)
Per dr. Alcario Drought inpatient, tele  Kenzie Flakes, MD 09/08/14 [phone]

## 2014-09-08 NOTE — ED Notes (Signed)
Hospitalist at bedside 

## 2014-09-08 NOTE — Progress Notes (Signed)
CRITICAL VALUE ALERT  Critical value received:Lactic Acid 3.1  Date of notification:09/08/2014  Time of notification:12:48PM              Critical value read back:Yes.     Nurse who received alert:Ronnisha Felber, RN  MD notified (1st page):Rama  Time of first page:12:51PM  MD notified (2nd page):   Time of second page:  Responding MD:

## 2014-09-08 NOTE — Progress Notes (Signed)
CARE MANAGEMENT NOTE 09/08/2014  Patient:  Tyler Evans   Account Number:  [id number]  Date Initiated:  09/08/2014  Documentation initiated by:  Roldan Laforest  Subjective/Objective Assessment:   sirs     Action/Plan:   home when stable   Anticipated DC Date:  09/11/2014   Anticipated DC Plan:  HOME/SELF CARE  In-house referral  NA      DC Planning Services  CM consult      PAC Choice  NA   Choice offered to / List presented to:  NA           Status of service:  In process, will continue to follow Medicare Important Message given?   (If response is "NO", the following Medicare IM given date fields will be blank) Date Medicare IM given:   Medicare IM given by:   Date Additional Medicare IM given:   Additional Medicare IM given by:    Discharge Disposition:    Per UR Regulation:  Reviewed for med. necessity/level of care/duration of stay  If discussed at [place] of Stay Meetings, dates discussed:    Comments:  September 08, 2014/Tyler Evans L. Rosana Hoes, RN, BSN, CCM. Case Management [place] [phone] No discharge needs present of time of review.

## 2014-09-09 DIAGNOSIS — R739 Hyperglycemia, unspecified: Secondary | ICD-10-CM | POA: Diagnosis present

## 2014-09-09 LAB — TYPE AND SCREEN
ABO/RH(D): A POS
ANTIBODY SCREEN: NEGATIVE
Unit division: 0
Unit division: 0

## 2014-09-09 LAB — BASIC METABOLIC PANEL
ANION GAP: 7 (ref 5–15)
BUN: 18 mg/dL (ref 6–23)
CO2: 24 mmol/L (ref 19–32)
Calcium: 7.5 mg/dL — ABNORMAL LOW (ref 8.4–10.5)
Chloride: 100 mmol/L (ref 96–112)
Creatinine, Ser: 1.43 mg/dL — ABNORMAL HIGH (ref 0.50–1.35)
GFR calc Af Amer: 55 mL/min — ABNORMAL LOW (ref >90)
GFR calc non Af Amer: 48 mL/min — ABNORMAL LOW (ref >90)
Glucose, Bld: 405 mg/dL — ABNORMAL HIGH (ref 70–99)
Potassium: 4.2 mmol/L (ref 3.5–5.1)
Sodium: 131 mmol/L — ABNORMAL LOW (ref 135–145)

## 2014-09-09 LAB — CBC
HCT: 28.5 % — ABNORMAL LOW (ref 39.0–52.0)
Hemoglobin: 9 g/dL — ABNORMAL LOW (ref 13.0–17.0)
MCH: 25.2 pg — ABNORMAL LOW (ref 26.0–34.0)
MCHC: 31.6 g/dL (ref 30.0–36.0)
MCV: 79.8 fL (ref 78.0–100.0)
PLATELETS: 251 10*3/uL (ref 150–400)
RBC: 3.57 MIL/uL — AB (ref 4.22–5.81)
RDW: 18 % — AB (ref 11.5–15.5)
WBC: 12.9 10*3/uL — AB (ref 4.0–10.5)

## 2014-09-09 LAB — GLUCOSE, CAPILLARY
GLUCOSE-CAPILLARY: 363 mg/dL — AB (ref 70–99)
GLUCOSE-CAPILLARY: 387 mg/dL — AB (ref 70–99)
GLUCOSE-CAPILLARY: 352 mg/dL — AB (ref 70–99)
Glucose-Capillary: 132 mg/dL — ABNORMAL HIGH (ref 70–99)

## 2014-09-09 LAB — URINE CULTURE
COLONY COUNT: NO GROWTH
Culture: NO GROWTH

## 2014-09-09 MED ORDER — INSULIN GLARGINE 100 UNIT/ML ~~LOC~~ SOLN
20.0000 [IU] | Freq: Every day | SUBCUTANEOUS | Status: DC
  Filled 2014-09-09: qty 0.2

## 2014-09-09 MED ORDER — INSULIN ASPART 100 UNIT/ML ~~LOC~~ SOLN: 0.0000 [IU] | Freq: Every day | SUBCUTANEOUS | Status: DC

## 2014-09-09 MED ORDER — INSULIN ASPART 100 UNIT/ML ~~LOC~~ SOLN
6.0000 [IU] | Freq: Three times a day (TID) | SUBCUTANEOUS | Status: DC
  Administered 2014-09-09 (×2): 6 [IU] via SUBCUTANEOUS

## 2014-09-09 MED ORDER — INSULIN ASPART 100 UNIT/ML ~~LOC~~ SOLN
8.0000 [IU] | Freq: Three times a day (TID) | SUBCUTANEOUS | Status: DC
  Administered 2014-09-10: 8 [IU] via SUBCUTANEOUS

## 2014-09-09 MED ORDER — INSULIN ASPART 100 UNIT/ML ~~LOC~~ SOLN
0.0000 [IU] | Freq: Three times a day (TID) | SUBCUTANEOUS | Status: DC
Start: 2014-09-09 — End: 2014-09-11
  Administered 2014-09-09 – 2014-09-10 (×3): 20 [IU] via SUBCUTANEOUS
  Administered 2014-09-10: 4 [IU] via SUBCUTANEOUS
  Administered 2014-09-10: 11 [IU] via SUBCUTANEOUS

## 2014-09-09 MED ORDER — LEVOFLOXACIN IN D5W 750 MG/150ML IV SOLN
750.0000 mg | Freq: Every day | INTRAVENOUS | Status: DC
  Administered 2014-09-09 – 2014-09-10 (×2): 750 mg via INTRAVENOUS
  Filled 2014-09-09 (×3): qty 150

## 2014-09-09 MED ORDER — INSULIN GLARGINE 100 UNIT/ML ~~LOC~~ SOLN
25.0000 [IU] | Freq: Every day | SUBCUTANEOUS | Status: DC
  Administered 2014-09-09: 25 [IU] via SUBCUTANEOUS
  Filled 2014-09-09: qty 0.25

## 2014-09-09 NOTE — Plan of Care (Signed)
Problem: Consults Goal: Diabetes Guidelines if Diabetic/Glucose > 140 If diabetic or lab glucose is > 140 mg/dl - Initiate Diabetes/Hyperglycemia Guidelines & Document Interventions  Outcome: Not Progressing Contacted Dr. Rockne Menghini re: pt's CBG still remaining in 300's with new insulin orders initiated at lunchtime.  Dr. Rockne Menghini now modifying scheduled Novolog and Lantus orders.  Will continue to monitor.

## 2014-09-09 NOTE — Evaluation (Signed)
Physical Therapy Evaluation Patient Details Name: Tyler Evans MRN: [id number] DOB: [birth year] Today's Date: 09/09/2014   History of Present Illness  admitted 4/7 with fever,, anemia,  receiving chemo for SB adenoma  Clinical Impression  Patient  Ambulated  X 400'. Patient  May ambulate with family. No further PT.    Follow Up Recommendations No PT follow up    Equipment Recommendations  None recommended by PT    Recommendations for Other Services       Precautions / Restrictions        Mobility  Bed Mobility Overal bed mobility: Independent                Transfers Overall transfer level: Independent Equipment used: None                Ambulation/Gait Ambulation/Gait assistance: Supervision Ambulation Distance (Feet): 400 Feet Assistive device:  (IV pole) Gait Pattern/deviations: WFL(Within Functional Limits)        Stairs            Wheelchair Mobility    Modified Rankin (Stroke Patients Only)       Balance                                             Pertinent Vitals/Pain Pain Assessment: No/denies pain    Home Living Family/patient expects to be discharged to:: Private residence Living Arrangements: Spouse/significant other             Home Equipment: None      Prior Function Level of Independence: Independent               Hand Dominance        Extremity/Trunk Assessment   Upper Extremity Assessment: Overall WFL for tasks assessed           Lower Extremity Assessment: Overall WFL for tasks assessed      Cervical / Trunk Assessment: Normal  Communication   Communication: No difficulties  Cognition Arousal/Alertness: Awake/alert Behavior During Therapy: WFL for tasks assessed/performed Overall Cognitive Status: Within Functional Limits for tasks assessed                      General Comments      Exercises        Assessment/Plan    PT Assessment Patent  does not need any further PT services  PT Diagnosis     PT Problem List    PT Treatment Interventions     PT Goals (Current goals can be found in the Care Plan section) Acute Rehab PT Goals Patient Stated Goal: to go home PT Goal Formulation: All assessment and education complete, DC therapy    Frequency     Barriers to discharge        Co-evaluation               End of Session   Activity Tolerance: Patient tolerated treatment well Patient left: in bed;with call bell/phone within reach;with family/visitor present Nurse Communication: Mobility status         Time: 7564-3329 PT Time Calculation (min) (ACUTE ONLY): 16 min   Charges:   PT Evaluation $Initial PT Evaluation Tier I: 1 Procedure     PT G CodesClaretha Evans 09/09/2014, 5:44 PM Tyler Evans PT [phone]

## 2014-09-09 NOTE — Plan of Care (Signed)
Problem: Consults Goal: Nutrition Consult-if indicated Outcome: Completed/Met Date Met:  09/09/14 Pt reported he met with dietician yesterday. Goal: Diabetes Guidelines if Diabetic/Glucose > 140 If diabetic or lab glucose is > 140 mg/dl - Initiate Diabetes/Hyperglycemia Guidelines & Document Interventions  Outcome: Not Progressing Pt continues with high CBG. MD aware and has changed meds.  Pt reports he has been drinking Ensure at the urging of dietician ("I had 3 yesterday.")  Pt now receiving Glucerna.  Will continue to monitor and educate.

## 2014-09-09 NOTE — Progress Notes (Signed)
Pharmacy - Abx dosing  Assessment: 1 yoM with small bowel cancer, undergoing chemotherapy with a continuous chemo pump in place. Patient presents to the ED with 1 day history of fever of 101.1 at home. Has had ongoing cough for a number of weeks but fever is new today. No chest pain, minimal abdominal pain that has been ongoing for weeks. Levaquin and Vancomycin per Rx for Sepsis.  4/8 >> Levaquin >>  4/8 >> Vancomycin >>   Temp: 102.9 yesterday noon, now afeb  WBC: moderately elevated, trending down  Renal: SCr improved to 1.43 (baseline appears to be >1.2) CrCl 52 ml/min  Lactate 4.6 > 3.2 (4/8) 3/1 (4/9)   4/8 blood: ngtd  4/8 urine: IP  Plan:  Increase Levaquin to 750 mg IV q24 hr with CrCl now >50 ml/min  Will check vanc trough tomorrow if still receiving  Reuel Boom, PharmD Pager: [phone] 09/09/2014, 12:01 PM

## 2014-09-09 NOTE — Progress Notes (Signed)
Progress Note   Tyler Evans GYI:[id number] DOB: 12/01/1945 DOA: 09/07/2014 PCP: Kandice Hams, MD   Brief Narrative:   Tyler Evans is an 68 y.o. male the PMH of small bowel cancer with liver metastasis diagnosed 08/2014, currently receiving systemic palliative chemotherapy with FOLFOX, under the care of Dr. Burr Medico, who was admitted 09/07/14 with a fever of 101.1 and ongoing cough. Chest x-ray was negative for active lung disease. Serum lactate was elevated. Admission vital signs showed a blood pressure 1:30/48, pulse 108, temperature 99.3 and respiratory rate of 25. Pulse oximetry was 100%. WBC was 18.6, hemoglobin 6.3.  Assessment/Plan:   Principal Problem:   SIRS (systemic inflammatory response syndrome) - Concern for sepsis, but source remains unclear.  Feels better today. - Continue empiric Levaquin and vancomycin. - Hemodynamically stable with MAXIMUM TEMPERATURE 101.9. - WBC declining. - Follow-up blood and urine cultures.  Active Problems:   Anemia - Multi factorial with probable GI losses in the setting of known small bowel cancer and bone marrow suppression from anti-neoplastic chemotherapy. - Status post 2 units of PRBCs 09/08/14 with a post transfusion hemoglobin of 9 mg/dL.    AKI (acute kidney injury) - Likely prerenal in etiology. Current creatinine 1.59--->1.43. - Baseline creatinine 1.2. - Continue to hydrate.    Diabetes mellitus without complication - Blood glucoses remain significantly elevated, 203-398 over the past 24 hours. - Increase Lantus to 20 units daily. Change SSI to resistant scale with meal coverage.  Glipizide on hold. - Hemoglobin A1c 7.7% when checked 08/17/14.    Small bowel cancer - Status post 1 cycle of FOLFOX chemotherapy. - Dr. Burr Medico following.    DVT Prophylaxis - SCDs ordered.  Code Status: Full. Family Communication: No family at the bedside.  Patient declined my offer to call & give updates. Disposition Plan: Home when  stable, lives with wife.   IV Access:    Porta-Cath   Procedures and diagnostic studies:   Dg Chest Port 1 View 09/08/2014: No active disease.     Medical Consultants:    None.  Anti-Infectives:    Levaquin 09/07/14--->  Vancomycin 09/07/14--->  Subjective:   Tyler Evans reports some improvement in his dry cough and headache.  Denies dyspnea, N/V/D, abdominal pain.  Appetite improving.  Last BM was 2 days ago.  Passing flatus.    Objective:    Filed Vitals:   09/08/14 1608 09/08/14 1800 09/08/14 2230 09/09/14 0600  BP: 129/60 110/49 123/39 145/71  Pulse:   86 98  Temp: 101.9 F (38.8 C) 99.5 F (37.5 C) 99.3 F (37.4 C) 99.1 F (37.3 C)  TempSrc: Oral Oral Oral   Resp: 16 16 18 18   Height:      Weight:      SpO2: 96% 98% 98% 94%    Intake/Output Summary (Last 24 hours) at 09/09/14 0824 Last data filed at 09/09/14 0600  Gross per 24 hour  Intake 2787.5 ml  Output   1075 ml  Net 1712.5 ml    Exam: Gen:  NAD, affect brighter Cardiovascular:  RRR, No M/R/G Respiratory:  Lungs CTAB Gastrointestinal:  Abdomen soft, NT/ND, + BS Extremities:  No C/E/C   Data Reviewed:    Labs: Basic Metabolic Panel:  Recent Labs Lab 09/08/14 0014 09/09/14 0530  NA 131* 131*  K 4.8 4.2  CL 95* 100  CO2 23 24  GLUCOSE 334* 405*  BUN 26* 18  CREATININE 1.59* 1.43*  CALCIUM 8.3* 7.5*   GFR  Estimated Creatinine Clearance: 51.7 mL/min (by C-G formula based on Cr of 1.43). Liver Function Tests:  Recent Labs Lab 09/08/14 0014  AST 137*  ALT 39  ALKPHOS 149*  BILITOT 1.0  PROT 7.5  ALBUMIN 3.1*   CBC:  Recent Labs Lab 09/08/14 0014 09/09/14 0530  WBC 18.6* 12.9*  NEUTROABS 16.8*  --   HGB 6.3* 9.0*  HCT 20.2* 28.5*  MCV 77.1* 79.8  PLT 521* 251   CBG:  Recent Labs Lab 09/08/14 0737 09/08/14 1153 09/08/14 1702 09/08/14 2133 09/09/14 0721  GLUCAP 311* 203* 337* 398* 363*   Sepsis Labs:  Recent Labs Lab 09/08/14 0014  09/08/14 0021 09/08/14 0444 09/08/14 1208 09/09/14 0530  WBC 18.6*  --   --   --  12.9*  LATICACIDVEN  --  4.65* 3.2* 3.1*  --    Microbiology Recent Results (from the past 240 hour(s))  Culture, blood (routine x 2)     Status: None (Preliminary result)   Collection Time: 09/08/14 12:14 AM  Result Value Ref Range Status   Specimen Description BLOOD RIGHT ANTECUBITAL  Final   Special Requests BOTTLES DRAWN AEROBIC AND ANAEROBIC 5CC  Final   Culture   Final           BLOOD CULTURE RECEIVED NO GROWTH TO DATE CULTURE WILL BE HELD FOR 5 DAYS BEFORE ISSUING A FINAL NEGATIVE REPORT Performed at [facility]    Report Status PENDING  Incomplete  Culture, blood (routine x 2)     Status: None (Preliminary result)   Collection Time: 09/08/14 12:14 AM  Result Value Ref Range Status   Specimen Description BLOOD L HAND  Final   Special Requests BOTTLES DRAWN AEROBIC AND ANAEROBIC 3CC  Final   Culture   Final           BLOOD CULTURE RECEIVED NO GROWTH TO DATE CULTURE WILL BE HELD FOR 5 DAYS BEFORE ISSUING A FINAL NEGATIVE REPORT Note: Culture results may be compromised due to an inadequate volume of blood received in culture bottles. Performed at [facility]    Report Status PENDING  Incomplete  MRSA PCR Screening     Status: None   Collection Time: 09/08/14  5:45 AM  Result Value Ref Range Status   MRSA by PCR NEGATIVE NEGATIVE Final    Comment:        The GeneXpert MRSA Assay (FDA approved for NASAL specimens only), is one component of a comprehensive MRSA colonization surveillance program. It is not intended to diagnose MRSA infection nor to guide or monitor treatment for MRSA infections.      Medications:   . atorvastatin  20 mg Oral Daily  . feeding supplement (GLUCERNA SHAKE)  237 mL Oral TID BM  . insulin aspart  0-15 Units Subcutaneous TID WC  . insulin aspart  0-5 Units Subcutaneous QHS  . insulin glargine  10 Units Subcutaneous QHS  . levofloxacin  (LEVAQUIN) IV  750 mg Intravenous Q48H  . lisinopril  20 mg Oral QHS  . ondansetron  8 mg Oral BID  . pantoprazole  40 mg Oral Daily  . sodium chloride  10-40 mL Intracatheter Q12H  . vancomycin  750 mg Intravenous Q12H   Continuous Infusions: . sodium chloride 75 mL/hr at 09/08/14 2210    Time spent: 25 minutes.   LOS: 1 day   RAMA,CHRISTINA  [facility] Pager [phone]. If unable to reach me by pager, please call my cell phone at [phone].  *Please  refer to [email or web address], password [email or web address] to get updated schedule on who will round on this patient, as hospitalists switch teams weekly. If 7PM-7AM, please contact night-coverage at [email or web address], password [email or web address] for any overnight needs.  09/09/2014, 8:24 AM

## 2014-09-10 DIAGNOSIS — R739 Hyperglycemia, unspecified: Secondary | ICD-10-CM

## 2014-09-10 LAB — GLUCOSE, CAPILLARY
GLUCOSE-CAPILLARY: 177 mg/dL — AB (ref 70–99)
Glucose-Capillary: 354 mg/dL — ABNORMAL HIGH (ref 70–99)
Glucose-Capillary: 267 mg/dL — ABNORMAL HIGH (ref 70–99)
Glucose-Capillary: 59 mg/dL — ABNORMAL LOW (ref 70–99)
Glucose-Capillary: 112 mg/dL — ABNORMAL HIGH (ref 70–99)

## 2014-09-10 LAB — BASIC METABOLIC PANEL
Anion gap: 6 (ref 5–15)
BUN: 17 mg/dL (ref 6–23)
CO2: 26 mmol/L (ref 19–32)
Calcium: 7.9 mg/dL — ABNORMAL LOW (ref 8.4–10.5)
Chloride: 104 mmol/L (ref 96–112)
Creatinine, Ser: 1.35 mg/dL (ref 0.50–1.35)
GFR calc non Af Amer: 51 mL/min — ABNORMAL LOW (ref >90)
GFR, EST AFRICAN AMERICAN: 59 mL/min — AB (ref >90)
Glucose, Bld: 199 mg/dL — ABNORMAL HIGH (ref 70–99)
POTASSIUM: 4 mmol/L (ref 3.5–5.1)
Sodium: 136 mmol/L (ref 135–145)

## 2014-09-10 LAB — CBC
HEMATOCRIT: 27.4 % — AB (ref 39.0–52.0)
Hemoglobin: 8.7 g/dL — ABNORMAL LOW (ref 13.0–17.0)
MCH: 25.4 pg — AB (ref 26.0–34.0)
MCHC: 31.8 g/dL (ref 30.0–36.0)
MCV: 79.9 fL (ref 78.0–100.0)
PLATELETS: 261 10*3/uL (ref 150–400)
RBC: 3.43 MIL/uL — ABNORMAL LOW (ref 4.22–5.81)
RDW: 18.4 % — ABNORMAL HIGH (ref 11.5–15.5)
WBC: 12.3 10*3/uL — AB (ref 4.0–10.5)

## 2014-09-10 LAB — VANCOMYCIN, TROUGH: Vancomycin Tr: 14.8 ug/mL (ref 10.0–20.0)

## 2014-09-10 MED ORDER — INSULIN GLARGINE 100 UNIT/ML ~~LOC~~ SOLN
30.0000 [IU] | Freq: Every day | SUBCUTANEOUS | Status: DC
  Filled 2014-09-10: qty 0.3

## 2014-09-10 MED ORDER — INSULIN ASPART 100 UNIT/ML ~~LOC~~ SOLN
10.0000 [IU] | Freq: Three times a day (TID) | SUBCUTANEOUS | Status: DC
  Administered 2014-09-10 – 2014-09-11 (×3): 10 [IU] via SUBCUTANEOUS

## 2014-09-10 NOTE — Progress Notes (Signed)
Progress Note   JARET COPPEDGE HEN:[id number] DOB: 11/04/1945 DOA: 09/07/2014 PCP: Kandice Hams, MD   Brief Narrative:   Tyler Evans is an 69 y.o. male the PMH of small bowel cancer with liver metastasis diagnosed 08/2014, currently receiving systemic palliative chemotherapy with FOLFOX, under the care of Dr. Burr Medico, who was admitted 09/07/14 with a fever of 101.1 and ongoing cough. Chest x-ray was negative for active lung disease. Serum lactate was elevated. Admission vital signs showed a blood pressure 1:30/48, pulse 108, temperature 99.3 and respiratory rate of 25. Pulse oximetry was 100%. WBC was 18.6, hemoglobin 6.3.  Assessment/Plan:   Principal Problem:   SIRS (systemic inflammatory response syndrome) - Concern for sepsis, but source remains unclear.  Feels better today. - Continue empiric Levaquin and vancomycin. - Hemodynamically stable with MAXIMUM TEMPERATURE 100.9. - WBC declining. - Urine culture negative.  Follow-up blood cultures.  Active Problems:   Anemia - Multi factorial with probable GI losses in the setting of known small bowel cancer and bone marrow suppression from anti-neoplastic chemotherapy. - Status post 2 units of PRBCs 09/08/14.  Hemoglobin slowly drifting down, but no indication for further transfusion.    AKI (acute kidney injury) - Likely prerenal in etiology. Current creatinine 1.59--->1.43--->1.35. - Baseline creatinine 1.2. - Continue to hydrate.    Diabetes mellitus without complication - Blood glucoses remain significantly elevated, 177-387 over the past 24 hours. - Currently being managed with Lantus to 25 units daily, SSI resistant scale with 8 units of meal coverage.   - Increase Lantus to 30 units and change meal coverage to 10 units. - Glipizide on hold. - Hemoglobin A1c 7.7% when checked 08/17/14. - Consult DM coordinator.    Small bowel cancer - Status post 1 cycle of FOLFOX chemotherapy. - Dr. Burr Medico following.    DVT  Prophylaxis - SCDs ordered.  Code Status: Full. Family Communication: Wife at bedside. Disposition Plan: Home when stable, lives with wife.   IV Access:    Porta-Cath   Procedures and diagnostic studies:   Dg Chest Port 1 View 09/08/2014: No active disease.     Medical Consultants:    None.  Anti-Infectives:    Levaquin 09/07/14--->  Vancomycin 09/07/14--->  Subjective:   [name] reports ongoing improvement in his dry cough and headache now gone.  Denies dyspnea, abdominal pain but did have a "runny" stool last night.  Appetite improving.     Objective:    Filed Vitals:   09/09/14 2001 09/09/14 2200 09/10/14 0217 09/10/14 0540  BP:  110/56 115/66 130/67  Pulse:  83 80 88  Temp: 99 F (37.2 C) 100.1 F (37.8 C) 98.8 F (37.1 C) 99 F (37.2 C)  TempSrc: Oral Oral Oral Oral  Resp:  18 18 16   Height:      Weight:      SpO2:  99% 99% 99%    Intake/Output Summary (Last 24 hours) at 09/10/14 1150 Last data filed at 09/10/14 0604  Gross per 24 hour  Intake   2165 ml  Output    475 ml  Net   1690 ml    Exam: Gen:  NAD, affect brighter Cardiovascular:  RRR, No M/R/G Respiratory:  Lungs CTAB Gastrointestinal:  Abdomen soft, NT/ND, + BS Extremities:  No C/E/C   Data Reviewed:    Labs: Basic Metabolic Panel:  Recent Labs Lab 09/08/14 0014 09/09/14 0530 09/10/14 0506  NA 131* 131* 136  K 4.8 4.2 4.0  CL  95* 100 104  CO2 23 24 26   GLUCOSE 334* 405* 199*  BUN 26* 18 17  CREATININE 1.59* 1.43* 1.35  CALCIUM 8.3* 7.5* 7.9*   GFR Estimated Creatinine Clearance: 54.7 mL/min (by C-G formula based on Cr of 1.35). Liver Function Tests:  Recent Labs Lab 09/08/14 0014  AST 137*  ALT 39  ALKPHOS 149*  BILITOT 1.0  PROT 7.5  ALBUMIN 3.1*   CBC:  Recent Labs Lab 09/08/14 0014 09/09/14 0530 09/10/14 0506  WBC 18.6* 12.9* 12.3*  NEUTROABS 16.8*  --   --   HGB 6.3* 9.0* 8.7*  HCT 20.2* 28.5* 27.4*  MCV 77.1* 79.8 79.9  PLT 521*  251 261   CBG:  Recent Labs Lab 09/09/14 0721 09/09/14 1329 09/09/14 1705 09/09/14 2145 09/10/14 0828  GLUCAP 363* 387* 352* 132* 177*   Sepsis Labs:  Recent Labs Lab 09/08/14 0014 09/08/14 0021 09/08/14 0444 09/08/14 1208 09/09/14 0530 09/10/14 0506  WBC 18.6*  --   --   --  12.9* 12.3*  LATICACIDVEN  --  4.65* 3.2* 3.1*  --   --    Microbiology Recent Results (from the past 240 hour(s))  Culture, blood (routine x 2)     Status: None (Preliminary result)   Collection Time: 09/08/14 12:14 AM  Result Value Ref Range Status   Specimen Description BLOOD RIGHT ANTECUBITAL  Final   Special Requests BOTTLES DRAWN AEROBIC AND ANAEROBIC 5CC  Final   Culture   Final           BLOOD CULTURE RECEIVED NO GROWTH TO DATE CULTURE WILL BE HELD FOR 5 DAYS BEFORE ISSUING A FINAL NEGATIVE REPORT Performed at [facility]    Report Status PENDING  Incomplete  Culture, blood (routine x 2)     Status: None (Preliminary result)   Collection Time: 09/08/14 12:14 AM  Result Value Ref Range Status   Specimen Description BLOOD L HAND  Final   Special Requests BOTTLES DRAWN AEROBIC AND ANAEROBIC 3CC  Final   Culture   Final           BLOOD CULTURE RECEIVED NO GROWTH TO DATE CULTURE WILL BE HELD FOR 5 DAYS BEFORE ISSUING A FINAL NEGATIVE REPORT Note: Culture results may be compromised due to an inadequate volume of blood received in culture bottles. Performed at [facility]    Report Status PENDING  Incomplete  Urine culture     Status: None   Collection Time: 09/08/14 12:21 AM  Result Value Ref Range Status   Specimen Description URINE, CLEAN CATCH  Final   Special Requests NONE  Final   Colony Count NO GROWTH Performed at [facility]   Final   Culture NO GROWTH Performed at [facility]   Final   Report Status 09/09/2014 FINAL  Final  MRSA PCR Screening     Status: None   Collection Time: 09/08/14  5:45 AM  Result Value Ref Range Status   MRSA by  PCR NEGATIVE NEGATIVE Final    Comment:        The GeneXpert MRSA Assay (FDA approved for NASAL specimens only), is one component of a comprehensive MRSA colonization surveillance program. It is not intended to diagnose MRSA infection nor to guide or monitor treatment for MRSA infections.      Medications:   . atorvastatin  20 mg Oral Daily  . feeding supplement (GLUCERNA SHAKE)  237 mL Oral TID BM  . insulin aspart  0-20 Units  Subcutaneous TID WC  . insulin aspart  0-5 Units Subcutaneous QHS  . insulin aspart  10 Units Subcutaneous TID WC  . insulin glargine  30 Units Subcutaneous QHS  . levofloxacin (LEVAQUIN) IV  750 mg Intravenous Daily  . lisinopril  20 mg Oral QHS  . ondansetron  8 mg Oral BID  . pantoprazole  40 mg Oral Daily  . sodium chloride  10-40 mL Intracatheter Q12H  . vancomycin  750 mg Intravenous Q12H   Continuous Infusions: . sodium chloride 75 mL/hr at 09/10/14 0140    Time spent: 25 minutes.   LOS: 2 days   Manreet Kiernan  [facility] Pager [phone]. If unable to reach me by pager, please call my cell phone at [phone].  *Please refer to [email or web address], password [email or web address] to get updated schedule on who will round on this patient, as hospitalists switch teams weekly. If 7PM-7AM, please contact night-coverage at [email or web address], password [email or web address] for any overnight needs.  09/10/2014, 11:50 AM

## 2014-09-10 NOTE — Progress Notes (Signed)
Pharmacy - Abx dosing  Assessment: 58 yoM with small bowel cancer, undergoing chemotherapy with a continuous chemo pump in place. Patient presents to the ED with 1 day history of fever of 101.1 at home. Has had ongoing cough for a number of weeks but fever is new today. No chest pain, minimal abdominal pain that has been ongoing for weeks. Levaquin and Vancomycin per Rx for Sepsis.  4/8 >> Levaquin >> 4/8 >> Vancomycin >>  Temp: still spiking fevers but with lower temps; 100.8 yesterday, now afeb WBC: sl elevated, improved Renal: SCr improved to 1.35 (baseline appears to be >1.2) CrCl 55 ml/min Lactate 4.6 > 3.2 (4/8) 3.1 (4/9)  4/8 blood: ngtd 4/8 urine: NGF  Dose changes/levels: 4/9: increase levaquin to daily with CrCl now >50 4/10: 1400 VT (drawn 1 hr late) = 14.8 on 750 q12 (likely underestimate d/t late draw).  Plan:  Continue vancomycin 750 mg IV q12; trough therapeutic.  Continue Levaquin 750 mg IV q24 hr with CrCl >50 ml/min  Reuel Boom, PharmD Pager: [phone] 09/10/2014, 2:43 PM

## 2014-09-11 DIAGNOSIS — D638 Anemia in other chronic diseases classified elsewhere: Secondary | ICD-10-CM

## 2014-09-11 LAB — GLUCOSE, CAPILLARY: GLUCOSE-CAPILLARY: 188 mg/dL — AB (ref 70–99)

## 2014-09-11 MED ORDER — INSULIN ASPART 100 UNIT/ML ~~LOC~~ SOLN: 7.0000 [IU] | Freq: Three times a day (TID) | SUBCUTANEOUS | Status: DC

## 2014-09-11 MED ORDER — HEPARIN SOD (PORK) LOCK FLUSH 100 UNIT/ML IV SOLN
500.0000 [IU] | INTRAVENOUS | Status: AC | PRN
  Administered 2014-09-11: 500 [IU]

## 2014-09-11 MED ORDER — LEVOFLOXACIN 500 MG PO TABS: 500.0000 mg | ORAL_TABLET | Freq: Every day | ORAL | Status: DC

## 2014-09-11 NOTE — Discharge Summary (Addendum)
Physician Discharge Summary  Tyler Evans TKZ:[id number] DOB: 06-23-1945 DOA: 09/07/2014  PCP: Kandice Hams, MD  Admit date: 09/07/2014 Discharge date: 09/11/2014  Recommendations for Outpatient Follow-up:  1. Please note glipizide on hold. Added Novolog 7 units 3 times a day with meals as prescribed. 2. Continue Levaquin 500 mg daily for 6 days.  Discharge Diagnoses:  Principal Problem:   SIRS (systemic inflammatory response syndrome) Active Problems:   Anemia   AKI (acute kidney injury)   Diabetes mellitus without complication   Small bowel cancer   Cough   Hyperglycemia    Discharge Condition: stable; patient wants to go home today. He was advised to follow-up with primary care physician per scheduled appointment and have hemoglobin rechecked.  Diet recommendation: as tolerated   History of present illness:  69 y.o. male the PMH of small bowel cancer with liver metastasis diagnosed 08/2014, on systemic palliative chemotherapy with FOLFOX, under the care of Dr. Burr Medico.  Patient presented to [facility] hospital with fevers of 101.50F and cough. His chest x-ray was negative for acute cardiopulmonary findings. Lactate was elevated, patient was tachycardic and tachpneic. His oxygen saturation was 100% on room air. White blood cell count was 18.6 and hemoglobin 6.3. Patient was started on broad-spectrum antibiotics even though the source of infection was not identified. He has received total of 2 units of blood transfusion since the admission.   Assessment/Plan:   Principal Problem:  Sepsis, unspecified organism, unclear etiology / leukocytosis - Sepsis criteria met on the admission with fever, tachycardia, tachypnea in addition to elevated white blood cell count and lactate - As mentioned, patient was started on broad-spectrum antibiotics on the admission, vancomycin and Levaquin. - Blood cultures to date are negative. Urine cultures are negative. - We will continue  Levaquin for 6 more days on discharge which would complete a total of 10 day treatment with antibiotics. - Patient is hemodynamically stable at this time. No fevers in past 24 hours.  Active Problems:  Anemia of chronic disease / acute blood loss anemia - Anemia secondary to history of malignancy as well as sequela of chemotherapy - No reports of bleeding in last 24-48 hours. - Patient has received total of 2 units of blood transfusion since the admission. - Hemoglobin on discharge is 8.7.   AKI (acute kidney injury) - Likely secondary to ? Sepsis - Creatinine normalized with IV fluids   Diabetes mellitus without complication - Patient noted to have episodes of hypoglycemia with the current insulin regimen which includes Lantus 30 units with meal coverage is 10 units 3 times daily. At the time of discharge we will continue Lantus 30 units at bedtime but we will decrease meal coverage to 7 units with meals. - Glipizide remains on hold. - Hemoglobin A1c 7.7% when checked 08/17/14.   Small bowel cancer - Status post 1 cycle of FOLFOX chemotherapy. - Management per oncology   DVT Prophylaxis - SCDs ordered all patient in hospital because of risk of bleeding.  Code Status: Full. Family Communication: Family not at the bedside   IV Access:    Porta-Cath   Procedures and diagnostic studies:   Dg Chest Port 1 View 09/08/2014: No active disease.   Medical Consultants:    None.  Anti-Infectives:    Levaquin 09/07/14---> for 6 days on discharge  Vancomycin 09/07/14---> 09/11/2014   Time spent coordinating the discharge, more than 30 minutes.   Signed:  Leisa Lenz, MD  [facility] 09/11/2014, 9:14 AM  Pager #:  [phone]    Discharge Exam: Filed Vitals:   09/11/14 0455  BP: 99/78  Pulse: 74  Temp: 98.8 F (37.1 C)  Resp: 18   Filed Vitals:   09/10/14 0540 09/10/14 1400 09/10/14 2200 09/11/14 0455  BP: 130/67 128/68 154/69 99/78   Pulse: 88 84 88 74  Temp: 99 F (37.2 C) 98.7 F (37.1 C) 99.4 F (37.4 C) 98.8 F (37.1 C)  TempSrc: Oral Oral Oral Oral  Resp: _0 Height:      Weight:      SpO2: 99% 99% 100% 98%    General: Pt is alert, follows commands appropriately, not in acute distress Cardiovascular: Regular rate and rhythm, S1/S2 +, no murmurs Respiratory: Clear to auscultation bilaterally, no wheezing, no crackles, no rhonchi Abdominal: Soft, non tender, non distended, bowel sounds +, no guarding Extremities: no edema, no cyanosis, pulses palpable bilaterally DP and PT Neuro: Grossly nonfocal  Discharge Instructions  Discharge Instructions    Call MD for:  difficulty breathing, headache or visual disturbances    Complete by:  As directed      Call MD for:  persistant dizziness or light-headedness    Complete by:  As directed      Call MD for:  persistant nausea and vomiting    Complete by:  As directed      Call MD for:  severe uncontrolled pain    Complete by:  As directed      Diet - low sodium heart healthy    Complete by:  As directed      Discharge instructions    Complete by:  As directed   1. Please note glipizide on hold. Added Novolog 7 units 3 times a day with meals as prescribed. 2. Continue Levaquin 500 mg daily for 6 days.     Increase activity slowly    Complete by:  As directed             Medication List    STOP taking these medications        glipiZIDE 10 MG tablet  Commonly known as:  GLUCOTROL      TAKE these medications        atorvastatin 20 MG tablet  Commonly known as:  LIPITOR  Take 20 mg by mouth daily.     insulin aspart 100 UNIT/ML injection  Commonly known as:  novoLOG  Inject 7 Units into the skin 3 (three) times daily with meals.     insulin glargine 100 UNIT/ML injection  Commonly known as:  LANTUS  Inject 20 Units into the skin at bedtime.     levofloxacin 500 MG tablet  Commonly known as:  LEVAQUIN  Take 1 tablet (500 mg total)  by mouth daily.     lidocaine-prilocaine cream  Commonly known as:  EMLA  Apply to affected area once     lisinopril 40 MG tablet  Commonly known as:  PRINIVIL,ZESTRIL  Take 20 mg by mouth at bedtime.     ondansetron 8 MG tablet  Commonly known as:  ZOFRAN  Take 1 tablet (8 mg total) by mouth 2 (two) times daily. Start the day after chemo for 2 days. Then take as needed for nausea or vomiting.     pantoprazole 40 MG tablet  Commonly known as:  PROTONIX  Take 1 tablet (40 mg total) by mouth daily.     PRESCRIPTION MEDICATION  See admin instructions. Receives Oxaliplatin, Leucovorin, and Adrucil chemotherapy every 2  weeks.           Follow-up Information    Follow up with Kandice Hams, MD. Schedule an appointment as soon as possible for a visit in 1 week.   Specialty:  Internal Medicine   Why:  Follow up appt after recent hospitalization   Contact information:   [address] [place] [address] [phone]        The results of significant diagnostics from this hospitalization (including imaging, microbiology, ancillary and laboratory) are listed below for reference.    Significant Diagnostic Studies: Ct Abdomen Pelvis Wo Contrast  08/18/2014   CLINICAL DATA:  Chronic fatigue and cough. Feeling tired for the past 3-4 weeks. Positive hemoccult. Decreased hemoglobin. Initial encounter.  EXAM: CT ABDOMEN AND PELVIS WITHOUT CONTRAST  TECHNIQUE: Multidetector CT imaging of the abdomen and pelvis was performed following the standard protocol without IV contrast.  COMPARISON:  None.  FINDINGS: The visualized lung bases are clear.  Multiple large masses are seen within the liver, bulky in appearance. The largest of these measures perhaps 5.7 cm, though all of the masses are relatively large. This is unusual for a primary hepatic malignancy, though it remains a possibility. Metastatic disease is a concern. The spleen is unremarkable.  Stones are noted within the  gallbladder; the gallbladder is otherwise unremarkable in appearance. The pancreas and adrenal glands are within normal limits.  There appears to be a focal filling defect within the proximal jejunum, measuring approximately 2.5 x 1.7 cm, with surrounding contrast. There is mild adjacent jejunal wall thickening. Malignancy cannot be excluded. Capsule endoscopy could be considered for further evaluation, as deemed clinically appropriate.  Mild nonspecific perinephric stranding is noted bilaterally. The kidneys are otherwise unremarkable. There is no evidence of hydronephrosis. No renal or ureteral stones are seen.  No free fluid is identified. The small bowel is unremarkable in appearance. The stomach is within normal limits. No acute vascular abnormalities are seen.  The appendix is normal in caliber, without evidence of appendicitis. Scattered diverticulosis is noted along the descending and proximal sigmoid colon, without evidence of diverticulitis. The colon is otherwise unremarkable.  The bladder is decompressed and not well assessed. The prostate is enlarged, measuring 5.3 cm in transverse dimension. No inguinal lymphadenopathy is seen.  No acute osseous abnormalities are identified.  IMPRESSION: 1. Multiple large masses noted within the liver, bulky in appearance. These measure up to 5.7 cm in size; all of the masses are relatively large. This is unusual for primary hepatic malignancy, though it remains a possibility. Metastatic disease is a concern. Would correlate with LFTs; these lesions may be the most amenable to biopsy. 2. Focal filling defect within the proximal jejunum, measuring 2.5 x 1.7 cm, with surrounding contrast. Mild adjacent jejunal wall thickening. Malignancy cannot be excluded. Capsule endoscopy could be considered for further evaluation, as deemed clinically appropriate. 3. Scattered diverticulosis along the descending and proximal sigmoid colon, without evidence of diverticulitis. 4.  Enlarged prostate noted. 5. Mild cholelithiasis noted; gallbladder otherwise unremarkable.   Electronically Signed   By: Garald Balding M.D.   On: 08/18/2014 01:16   Nm Pet Image Initial (pi) Skull Base To Thigh  09/05/2014   CLINICAL DATA:  Initial treatment strategy for small bowel carcinoma.  EXAM: NUCLEAR MEDICINE PET SKULL BASE TO THIGH  TECHNIQUE: 11.0 mCi F-18 FDG was injected intravenously. Full-ring PET imaging was performed from the skull base to thigh after the radiotracer. CT data was obtained and used for  attenuation correction and anatomic localization.  FASTING BLOOD GLUCOSE:  Value: 135 mg/dl  COMPARISON:  Noncontrast AP CT on 08/18/2014  FINDINGS: NECK  No hypermetabolic lymph nodes in the neck.  CHEST  No hypermetabolic mediastinal or hilar nodes. No suspicious pulmonary nodules on the CT scan.  ABDOMEN/PELVIS  Multiple hypermetabolic liver metastases are seen involving both the right and left lobes diffusely. Index metastasis in the lateral segment left lobe has an SUV max of 13.3.  Hypermetabolic mass involving a small bowel loop with adjacent mesenteric lymphadenopathy is seen within the abdomen just to the left of midline. This mass and adjacent lymphadenopathy to gather measures approximately 3.7 x 4.7 cm on image 127/series 4. This is hypermetabolic with SUV max of 80.3. No evidence of small bowel obstruction.  Mild hypermetabolic lymphadenopathy is seen in the porta hepatis, with SUV max of 3.2, suspicious for metastatic lymphadenopathy.  Incidental note is made of tiny calcified gallstones, without evidence of cholecystitis. Colonic diverticulosis is also noted without evidence of diverticulitis or other acute inflammatory process. Mildly enlarged prostate noted, without focal hypermetabolic activity.  SKELETON  No focal hypermetabolic activity to suggest skeletal metastasis.  IMPRESSION: Hypermetabolic mass involving a small bowel loop with adjacent mesenteric lymphadenopathy in the left  abdomen, consistent with known primary small bowel carcinoma. No evidence of small bowel obstruction.  Diffuse liver metastases. Mild hypermetabolic lymphadenopathy in porta hepatis, also suspicious for metastatic disease.  No evidence of metastatic disease within the neck, chest, or pelvis.  Incidental findings including cholelithiasis, diverticulosis, and mildly enlarged prostate.   Electronically Signed   By: Earle Gell M.D.   On: 09/05/2014 17:29   US Biopsy  08/28/2014   CLINICAL DATA:  Multiple liver lesions.  Small bowel lesion.  EXAM: ULTRASOUND-GUIDED BIOPSY OF A RIGHT LOBE LIVER LESION.  CORE.  MEDICATIONS AND MEDICAL HISTORY: Versed 1 mg, Fentanyl 50 mcg.  Additional Medications: None.  ANESTHESIA/SEDATION: Moderate sedation time: 20 minutes  PROCEDURE: The procedure, risks, benefits, and alternatives were explained to the patient. Questions regarding the procedure were encouraged and answered. The patient understands and consents to the procedure.  The right flank was prepped with Betadine in a sterile fashion, and a sterile drape was applied covering the operative field. A sterile gown and sterile gloves were used for the procedure.  Under sonographic guidance, an 17gauge guide needle was advanced into the right lobe liver lesion. Subsequently 3 18 gauge core biopsies were obtained. Gel-Foam slurry was injected into the tract with guide needle removal. Final imaging was performed.  Patient tolerated the procedure well without complication. Vital sign monitoring by nursing staff during the procedure will continue as patient is in the special procedures unit for post procedure observation.  FINDINGS: The images document guide needle placement within the right lobe liver lesion. Post biopsy images demonstrateno hemorrhage.  COMPLICATIONS: None.  IMPRESSION: Successful ultrasound-guided core biopsy of a right lobe liver lesion.   Electronically Signed   By: Marybelle Killings M.D.   On: 08/28/2014 17:12    Ir Fluoro Guide Cv Line Right  08/28/2014   CLINICAL DATA:  Small bowel adenocarcinoma  EXAM: TUNNEL POWER PORT PLACEMENT WITH SUBCUTANEOUS POCKET UTILIZING ULTRASOUND & FLOUROSCOPY  FLUOROSCOPY TIME:  48 seconds.  MEDICATIONS AND MEDICAL HISTORY: Versed 1.5 mg, Fentanyl 75 mcg.  As antibiotic prophylaxis, Ancef was ordered pre-procedure and administered intravenously within one hour of incision.  ANESTHESIA/SEDATION: Moderate sedation time: 30 minutes  CONTRAST:  None  PROCEDURE: After written informed consent was  obtained, patient was placed in the supine position on angiographic table. The right neck and chest was prepped and draped in a sterile fashion. Lidocaine was utilized for local anesthesia. The right internal jugular vein was noted to be patent initially with ultrasound. Under sonographic guidance, a micropuncture needle was inserted into the right IJ vein (Ultrasound and fluoroscopic image documentation was performed). The needle was removed over an 018 wire which was exchanged for a Amplatz. This was advanced into the IVC. An 8-French dilator was advanced over the Amplatz.  A small incision was made in the right upper chest over the anterior right second rib. Utilizing blunt dissection, a subcutaneous pocket was created in the caudal direction. The pocket was irrigated with a copious amount of sterile normal saline. The port catheter was tunneled from the chest incision, and out the neck incision. The reservoir was inserted into the subcutaneous pocket and secured with two 3-0 Ethilon stitches. A peel-away sheath was advanced over the Amplatz wire. The port catheter was cut to measure length and inserted through the peel-away sheath. The peel-away sheath was removed. The chest incision was closed with 3-0 Vicryl interrupted stitches for the subcutaneous tissue and a running of 4-0 Vicryl subcuticular stitch for the skin. The neck incision was closed with a 4-0 Vicryl subcuticular stitch. Derma-bond  was applied to both surgical incisions. The port reservoir was flushed and instilled with heparinized saline. No complications.  FINDINGS: A right IJ vein Port-A-Cath is in place with its tip at the cavoatrial junction.  COMPLICATIONS: None  IMPRESSION: Successful 8 French right internal jugular vein power port placement with its tip at the SVC/RA junction.   Electronically Signed   By: Marybelle Killings M.D.   On: 08/28/2014 17:06   Ir US Guide Vasc Access Right  08/28/2014   CLINICAL DATA:  Small bowel adenocarcinoma  EXAM: TUNNEL POWER PORT PLACEMENT WITH SUBCUTANEOUS POCKET UTILIZING ULTRASOUND & FLOUROSCOPY  FLUOROSCOPY TIME:  48 seconds.  MEDICATIONS AND MEDICAL HISTORY: Versed 1.5 mg, Fentanyl 75 mcg.  As antibiotic prophylaxis, Ancef was ordered pre-procedure and administered intravenously within one hour of incision.  ANESTHESIA/SEDATION: Moderate sedation time: 30 minutes  CONTRAST:  None  PROCEDURE: After written informed consent was obtained, patient was placed in the supine position on angiographic table. The right neck and chest was prepped and draped in a sterile fashion. Lidocaine was utilized for local anesthesia. The right internal jugular vein was noted to be patent initially with ultrasound. Under sonographic guidance, a micropuncture needle was inserted into the right IJ vein (Ultrasound and fluoroscopic image documentation was performed). The needle was removed over an 018 wire which was exchanged for a Amplatz. This was advanced into the IVC. An 8-French dilator was advanced over the Amplatz.  A small incision was made in the right upper chest over the anterior right second rib. Utilizing blunt dissection, a subcutaneous pocket was created in the caudal direction. The pocket was irrigated with a copious amount of sterile normal saline. The port catheter was tunneled from the chest incision, and out the neck incision. The reservoir was inserted into the subcutaneous pocket and secured with two  3-0 Ethilon stitches. A peel-away sheath was advanced over the Amplatz wire. The port catheter was cut to measure length and inserted through the peel-away sheath. The peel-away sheath was removed. The chest incision was closed with 3-0 Vicryl interrupted stitches for the subcutaneous tissue and a running of 4-0 Vicryl subcuticular stitch for the skin. The neck incision  was closed with a 4-0 Vicryl subcuticular stitch. Derma-bond was applied to both surgical incisions. The port reservoir was flushed and instilled with heparinized saline. No complications.  FINDINGS: A right IJ vein Port-A-Cath is in place with its tip at the cavoatrial junction.  COMPLICATIONS: None  IMPRESSION: Successful 8 French right internal jugular vein power port placement with its tip at the SVC/RA junction.   Electronically Signed   By: Marybelle Killings M.D.   On: 08/28/2014 17:06   Dg Chest Port 1 View  09/08/2014   CLINICAL DATA:  Cough. History of small bowel cancer, hypertension, and diabetes. Current chemotherapy and radiation. Fever and cough today.  EXAM: PORTABLE CHEST - 1 VIEW  COMPARISON:  PET-CT scan 09/05/2014  FINDINGS: Are port type central venous catheter with tip over the mid SVC region. Normal heart size and pulmonary vascularity. No focal airspace disease or consolidation in the lungs. No blunting of costophrenic angles. No pneumothorax. Mediastinal contours appear intact. Degenerative changes in the spine.  IMPRESSION: No active disease.   Electronically Signed   By: Lucienne Capers M.D.   On: 09/08/2014 00:01    Microbiology: Recent Results (from the past 240 hour(s))  Culture, blood (routine x 2)     Status: None (Preliminary result)   Collection Time: 09/08/14 12:14 AM  Result Value Ref Range Status   Specimen Description BLOOD RIGHT ANTECUBITAL  Final   Special Requests BOTTLES DRAWN AEROBIC AND ANAEROBIC 5CC  Final   Culture   Final           BLOOD CULTURE RECEIVED NO GROWTH TO DATE CULTURE WILL BE HELD FOR  5 DAYS BEFORE ISSUING A FINAL NEGATIVE REPORT Performed at [facility]    Report Status PENDING  Incomplete  Culture, blood (routine x 2)     Status: None (Preliminary result)   Collection Time: 09/08/14 12:14 AM  Result Value Ref Range Status   Specimen Description BLOOD L HAND  Final   Special Requests BOTTLES DRAWN AEROBIC AND ANAEROBIC 3CC  Final   Culture   Final           BLOOD CULTURE RECEIVED NO GROWTH TO DATE CULTURE WILL BE HELD FOR 5 DAYS BEFORE ISSUING A FINAL NEGATIVE REPORT Note: Culture results may be compromised due to an inadequate volume of blood received in culture bottles. Performed at [facility]    Report Status PENDING  Incomplete  Urine culture     Status: None   Collection Time: 09/08/14 12:21 AM  Result Value Ref Range Status   Specimen Description URINE, CLEAN CATCH  Final   Special Requests NONE  Final   Colony Count NO GROWTH Performed at [facility]   Final   Culture NO GROWTH Performed at [facility]   Final   Report Status 09/09/2014 FINAL  Final  MRSA PCR Screening     Status: None   Collection Time: 09/08/14  5:45 AM  Result Value Ref Range Status   MRSA by PCR NEGATIVE NEGATIVE Final    Comment:        The GeneXpert MRSA Assay (FDA approved for NASAL specimens only), is one component of a comprehensive MRSA colonization surveillance program. It is not intended to diagnose MRSA infection nor to guide or monitor treatment for MRSA infections.      Labs: Basic Metabolic Panel:  Recent Labs Lab 09/08/14 0014 09/09/14 0530 09/10/14 0506  NA 131* 131* 136  K 4.8 4.2 4.0  CL  95* 100 104  CO2 _0 GLUCOSE 334* 405* 199*  BUN 26* 18 17  CREATININE 1.59* 1.43* 1.35  CALCIUM 8.3* 7.5* 7.9*   Liver Function Tests:  Recent Labs Lab 09/08/14 0014  AST 137*  ALT 39  ALKPHOS 149*  BILITOT 1.0  PROT 7.5  ALBUMIN 3.1*   No results for input(s): LIPASE, AMYLASE in the last 168 hours. No  results for input(s): AMMONIA in the last 168 hours. CBC:  Recent Labs Lab 09/08/14 0014 09/09/14 0530 09/10/14 0506  WBC 18.6* 12.9* 12.3*  NEUTROABS 16.8*  --   --   HGB 6.3* 9.0* 8.7*  HCT 20.2* 28.5* 27.4*  MCV 77.1* 79.8 79.9  PLT 521* 251 261   Cardiac Enzymes: No results for input(s): CKTOTAL, CKMB, CKMBINDEX, TROPONINI in the last 168 hours. BNP: BNP (last 3 results) No results for input(s): BNP in the last 8760 hours.  ProBNP (last 3 results) No results for input(s): PROBNP in the last 8760 hours.  CBG:  Recent Labs Lab 09/10/14 0828 09/10/14 1317 09/10/14 1716 09/10/14 2150 09/10/14 2253  GLUCAP 177* 354* 267* 59* 112*    Time coordinating discharge: Over 30 minutes

## 2014-09-11 NOTE — Progress Notes (Signed)
Pt had a low CBG 59. Gave pt a serving of carb, then it increased to 112. Notified provider on call, was told to hold 2200 dose of Lantus.

## 2014-09-11 NOTE — Discharge Instructions (Signed)
Anemia, Nonspecific Anemia is a condition in which the concentration of red blood cells or hemoglobin in the blood is below normal. Hemoglobin is a substance in red blood cells that carries oxygen to the tissues of the body. Anemia results in not enough oxygen reaching these tissues.  CAUSES  Common causes of anemia include:   Excessive bleeding. Bleeding may be internal or external. This includes excessive bleeding from periods (in women) or from the intestine.   Poor nutrition.   Chronic kidney, thyroid, and liver disease.  Bone marrow disorders that decrease red blood cell production.  Cancer and treatments for cancer.  HIV, AIDS, and their treatments.  Spleen problems that increase red blood cell destruction.  Blood disorders.  Excess destruction of red blood cells due to infection, medicines, and autoimmune disorders. SIGNS AND SYMPTOMS   Minor weakness.   Dizziness.   Headache.  Palpitations.   Shortness of breath, especially with exercise.   Paleness.  Cold sensitivity.  Indigestion.  Nausea.  Difficulty sleeping.  Difficulty concentrating. Symptoms may occur suddenly or they may develop slowly.  DIAGNOSIS  Additional blood tests are often needed. These help your health care provider determine the best treatment. Your health care provider will check your stool for blood and look for other causes of blood loss.  TREATMENT  Treatment varies depending on the cause of the anemia. Treatment can include:   Supplements of iron, vitamin B12, or folic acid.   Hormone medicines.   A blood transfusion. This may be needed if blood loss is severe.   Hospitalization. This may be needed if there is significant continual blood loss.   Dietary changes.  Spleen removal. HOME CARE INSTRUCTIONS Keep all follow-up appointments. It often takes many weeks to correct anemia, and having your health care provider check on your condition and your response to  treatment is very important. SEEK IMMEDIATE MEDICAL CARE IF:   You develop extreme weakness, shortness of breath, or chest pain.   You become dizzy or have trouble concentrating.  You develop heavy vaginal bleeding.   You develop a rash.   You have bloody or black, tarry stools.   You faint.   You vomit up blood.   You vomit repeatedly.   You have abdominal pain.  You have a fever or persistent symptoms for more than 2-3 days.   You have a fever and your symptoms suddenly get worse.   You are dehydrated.  MAKE SURE YOU:  Understand these instructions.  Will watch your condition.  Will get help right away if you are not doing well or get worse. Document Released: 06/26/2004 Document Revised: 01/19/2013 Document Reviewed: 11/12/2012 Exit[employer] Patient Information 2015 [employer]. This information is not intended to replace advice given to you by your health care provider. Make sure you discuss any questions you have with your health care provider.  

## 2014-09-11 NOTE — Progress Notes (Signed)
Hypoglycemic Event  CBG: 59             Treatment: 15 GM carbohydrate snack  Symptoms: None  Follow-up CBG: Time:2250 CBG Resut 112  Possible Reasons for Event: Unknown  Comments/MD notified:Obtained order to hold 2200 dose of Lantus.    Tyler Evans, Tyler Evans  Remember to initiate Hypoglycemia Order Set & complete

## 2014-09-12 ENCOUNTER — Other Ambulatory Visit: Payer: Self-pay | Admitting: Hematology

## 2014-09-12 DIAGNOSIS — C179 Malignant neoplasm of small intestine, unspecified: Secondary | ICD-10-CM

## 2014-09-13 ENCOUNTER — Ambulatory Visit (HOSPITAL_BASED_OUTPATIENT_CLINIC_OR_DEPARTMENT_OTHER): Payer: Medicare Other | Admitting: Nurse Practitioner

## 2014-09-13 ENCOUNTER — Ambulatory Visit (HOSPITAL_BASED_OUTPATIENT_CLINIC_OR_DEPARTMENT_OTHER): Payer: Medicare Other

## 2014-09-13 ENCOUNTER — Other Ambulatory Visit (HOSPITAL_BASED_OUTPATIENT_CLINIC_OR_DEPARTMENT_OTHER): Payer: Medicare Other

## 2014-09-13 VITALS — BP 126/68

## 2014-09-13 VITALS — BP 110/74 | HR 94 | Temp 97.9°F | Resp 18 | Ht 66.0 in | Wt 214.6 lb

## 2014-09-13 DIAGNOSIS — C787 Secondary malignant neoplasm of liver and intrahepatic bile duct: Secondary | ICD-10-CM

## 2014-09-13 DIAGNOSIS — C171 Malignant neoplasm of jejunum: Secondary | ICD-10-CM

## 2014-09-13 DIAGNOSIS — I10 Essential (primary) hypertension: Secondary | ICD-10-CM

## 2014-09-13 DIAGNOSIS — D509 Iron deficiency anemia, unspecified: Secondary | ICD-10-CM | POA: Diagnosis not present

## 2014-09-13 DIAGNOSIS — C179 Malignant neoplasm of small intestine, unspecified: Secondary | ICD-10-CM

## 2014-09-13 DIAGNOSIS — R634 Abnormal weight loss: Secondary | ICD-10-CM

## 2014-09-13 DIAGNOSIS — E119 Type 2 diabetes mellitus without complications: Secondary | ICD-10-CM

## 2014-09-13 DIAGNOSIS — N183 Chronic kidney disease, stage 3 (moderate): Secondary | ICD-10-CM | POA: Diagnosis not present

## 2014-09-13 DIAGNOSIS — R630 Anorexia: Secondary | ICD-10-CM

## 2014-09-13 DIAGNOSIS — D500 Iron deficiency anemia secondary to blood loss (chronic): Secondary | ICD-10-CM

## 2014-09-13 LAB — CBC & DIFF AND RETIC
BASO%: 0.7 % (ref 0.0–2.0)
Basophils Absolute: 0.1 10*3/uL (ref 0.0–0.1)
EOS ABS: 0.2 10*3/uL (ref 0.0–0.5)
EOS%: 1 % (ref 0.0–7.0)
HCT: 28.3 % — ABNORMAL LOW (ref 38.4–49.9)
HGB: 9.1 g/dL — ABNORMAL LOW (ref 13.0–17.1)
IMMATURE RETIC FRACT: 9 % (ref 3.00–10.60)
LYMPH#: 1.8 10*3/uL (ref 0.9–3.3)
LYMPH%: 11.1 % — ABNORMAL LOW (ref 14.0–49.0)
MCH: 24.7 pg — ABNORMAL LOW (ref 27.2–33.4)
MCHC: 32.1 g/dL (ref 32.0–36.0)
MCV: 76.8 fL — ABNORMAL LOW (ref 79.3–98.0)
MONO#: 1 10*3/uL — ABNORMAL HIGH (ref 0.1–0.9)
MONO%: 6 % (ref 0.0–14.0)
NEUT%: 81.2 % — ABNORMAL HIGH (ref 39.0–75.0)
NEUTROS ABS: 13.5 10*3/uL — AB (ref 1.5–6.5)
NRBC: 0 % (ref 0–0)
Platelets: 366 10*3/uL (ref 140–400)
RBC: 3.69 10*6/uL — ABNORMAL LOW (ref 4.20–5.82)
RDW: 21.6 % — AB (ref 11.0–14.6)
Retic %: 0.81 % (ref 0.80–1.80)
Retic Ct Abs: 29.89 10*3/uL — ABNORMAL LOW (ref 34.80–93.90)
WBC: 16.6 10*3/uL — ABNORMAL HIGH (ref 4.0–10.3)

## 2014-09-13 LAB — COMPREHENSIVE METABOLIC PANEL (CC13)
ALBUMIN: 2.1 g/dL — AB (ref 3.5–5.0)
ALK PHOS: 108 U/L (ref 40–150)
ALT: 19 U/L (ref 0–55)
AST: 34 U/L (ref 5–34)
Anion Gap: 11 mEq/L (ref 3–11)
BUN: 16.5 mg/dL (ref 7.0–26.0)
CO2: 24 mEq/L (ref 22–29)
CREATININE: 1.2 mg/dL (ref 0.7–1.3)
Calcium: 8 mg/dL — ABNORMAL LOW (ref 8.4–10.4)
Chloride: 103 mEq/L (ref 98–109)
EGFR: 61 mL/min/{1.73_m2} — ABNORMAL LOW (ref >90)
Glucose: 217 mg/dl — ABNORMAL HIGH (ref 70–140)
POTASSIUM: 4 meq/L (ref 3.5–5.1)
Sodium: 138 mEq/L (ref 136–145)
Total Bilirubin: 0.36 mg/dL (ref 0.20–1.20)
Total Protein: 6.2 g/dL — ABNORMAL LOW (ref 6.4–8.3)

## 2014-09-13 LAB — TECHNOLOGIST REVIEW

## 2014-09-13 MED ORDER — SODIUM CHLORIDE 0.9 % IJ SOLN
10.0000 mL | INTRAMUSCULAR | Status: DC | PRN
  Administered 2014-09-13: 10 mL
  Filled 2014-09-13: qty 10

## 2014-09-13 MED ORDER — SODIUM CHLORIDE 0.9 % IV SOLN
Freq: Once | INTRAVENOUS | Status: AC
  Administered 2014-09-13: 11:00:00 via INTRAVENOUS

## 2014-09-13 MED ORDER — HEPARIN SOD (PORK) LOCK FLUSH 100 UNIT/ML IV SOLN
500.0000 [IU] | Freq: Once | INTRAVENOUS | Status: AC | PRN
  Administered 2014-09-13: 500 [IU]
  Filled 2014-09-13: qty 5

## 2014-09-13 MED ORDER — SODIUM CHLORIDE 0.9 % IV SOLN
510.0000 mg | Freq: Once | INTRAVENOUS | Status: AC
  Administered 2014-09-13: 510 mg via INTRAVENOUS
  Filled 2014-09-13: qty 17

## 2014-09-13 NOTE — Progress Notes (Signed)
  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    08/18/2014 Pathology Results Liver biopsy showed metastatic adenocarcinoma, consistent with GI primary   08/18/2014 Initial Diagnosis Small bowel cancer   08/18/2014 Procedure small bowel enteroscopy with biopsy by Dr. Benson Norway showed at the proximal jejunum there was evidence of abnormal mucosa and friability, biopsy was obtained.    09/05/2014 Imaging PET hypermetabolic mass involving a small bowel loop with adjacent mesenteric lymphadenopathy, and diffuse liver metastasis and mild metabolic lymphadenopathy in porta hepatis.   09/06/2014 -  Chemotherapy mFOLFOX         INTERVAL HISTORY:   Mr. Bayle returns as scheduled. He completed cycle 1 FOLFOX on 09/06/2014. He denies nausea/vomiting. No mouth sores. No diarrhea. He avoided cold exposure. No fever. No shortness of breath or cough. No urinary symptoms. He denies pain.  He was admitted to the hospital on 09/07/2014 with fever and chills. Chest x-ray was negative. Cultures negative as well. He was discharged home on 09/11/2014 on antibiotics.   Objective:  Vital signs in last 24 hours:  Blood pressure 110/74, pulse 94, temperature 97.9 F (36.6 C), temperature source Oral, resp. rate 18, height 5\' 6"  (1.676 m), weight 214 lb 9.6 oz (97.342 kg), SpO2 99 %.    HEENT: No thrush or ulcers. Resp: Lungs clear bilaterally. Cardio: Regular rate and rhythm. GI: Abdomen soft and nontender. No hepatomegaly. Vascular: Trace bilateral ankle edema.  Skin: No rash. Port-A-Cath without erythema.    Lab  Results:  Lab Results  Component Value Date   WBC 16.6* 09/13/2014   HGB 9.1* 09/13/2014   HCT 28.3* 09/13/2014   MCV 76.8* 09/13/2014   PLT 366 09/13/2014   NEUTROABS 13.5* 09/13/2014    Imaging:  No results found.  Medications: I have reviewed the patient's current medications.  Assessment/Plan: 1. 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  CT abdomen/pelvis without contrast 08/17/2014 with multiple large masses within the liver, bulky in appearance. The largest measured approximately 5.7 cm. Focal filling defect within the proximal jejunum measuring approximately 2.5 x 1.7 cm. Mild adjacent jejunal wall thickening.  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   Cycle 1 FOLFOX 09/06/2014 2. Microcytic anemia in the setting of heme-positive stool status post red cell transfusion support 08/17/2014. 3. Anorexia/weight loss secondary to #1. 4. Hypertension. 5. Diabetes. 6. CKD stage III 7. Hospitalization 09/07/2014 through 09/11/2014 with fever/sepsis, culture negative.   Disposition: Mr. Holderman appears stable. He has completed 1 cycle of FOLFOX. He was subsequently admitted to the hospital with fever. No source for infection identified. He is currently completing outpatient antibiotics.  He is scheduled to receive IV iron today. He will return for a follow-up visit and cycle 2 FOLFOX in 1 week. He will contact the office in the interim with any problems.  Plan reviewed with Dr. Burr Medico.    Ned Card ANP/GNP-BC   09/13/2014  10:33 AM

## 2014-09-13 NOTE — Progress Notes (Signed)
Home infusion pump was disconnected while patient was inpatient.  Patient brought pump and chemo to clinic with him today.  37 mL residual volume remained on dose.  Home infusion flow sheet documented at this visit.

## 2014-09-13 NOTE — Patient Instructions (Signed)

## 2014-09-14 LAB — CULTURE, BLOOD (ROUTINE X 2)
CULTURE: NO GROWTH
Culture: NO GROWTH

## 2014-09-20 ENCOUNTER — Telehealth: Payer: Self-pay | Admitting: Hematology

## 2014-09-20 ENCOUNTER — Ambulatory Visit (HOSPITAL_BASED_OUTPATIENT_CLINIC_OR_DEPARTMENT_OTHER): Payer: Medicare Other | Admitting: Hematology

## 2014-09-20 ENCOUNTER — Ambulatory Visit: Payer: Medicare Other

## 2014-09-20 ENCOUNTER — Encounter: Payer: Self-pay | Admitting: Hematology

## 2014-09-20 ENCOUNTER — Ambulatory Visit (HOSPITAL_BASED_OUTPATIENT_CLINIC_OR_DEPARTMENT_OTHER): Payer: Medicare Other

## 2014-09-20 ENCOUNTER — Other Ambulatory Visit (HOSPITAL_BASED_OUTPATIENT_CLINIC_OR_DEPARTMENT_OTHER): Payer: Medicare Other

## 2014-09-20 VITALS — BP 131/62 | HR 79 | Temp 98.1°F | Resp 18 | Ht 66.0 in | Wt 210.4 lb

## 2014-09-20 DIAGNOSIS — C787 Secondary malignant neoplasm of liver and intrahepatic bile duct: Secondary | ICD-10-CM

## 2014-09-20 DIAGNOSIS — R634 Abnormal weight loss: Secondary | ICD-10-CM

## 2014-09-20 DIAGNOSIS — D500 Iron deficiency anemia secondary to blood loss (chronic): Secondary | ICD-10-CM

## 2014-09-20 DIAGNOSIS — D509 Iron deficiency anemia, unspecified: Secondary | ICD-10-CM

## 2014-09-20 DIAGNOSIS — Z5111 Encounter for antineoplastic chemotherapy: Secondary | ICD-10-CM | POA: Diagnosis not present

## 2014-09-20 DIAGNOSIS — N183 Chronic kidney disease, stage 3 (moderate): Secondary | ICD-10-CM

## 2014-09-20 DIAGNOSIS — E119 Type 2 diabetes mellitus without complications: Secondary | ICD-10-CM

## 2014-09-20 DIAGNOSIS — C179 Malignant neoplasm of small intestine, unspecified: Secondary | ICD-10-CM

## 2014-09-20 DIAGNOSIS — D630 Anemia in neoplastic disease: Secondary | ICD-10-CM

## 2014-09-20 DIAGNOSIS — C171 Malignant neoplasm of jejunum: Secondary | ICD-10-CM

## 2014-09-20 DIAGNOSIS — R630 Anorexia: Secondary | ICD-10-CM

## 2014-09-20 DIAGNOSIS — I10 Essential (primary) hypertension: Secondary | ICD-10-CM

## 2014-09-20 LAB — CBC & DIFF AND RETIC
BASO%: 0.2 % (ref 0.0–2.0)
BASOS ABS: 0 10*3/uL (ref 0.0–0.1)
EOS%: 1.7 % (ref 0.0–7.0)
Eosinophils Absolute: 0.1 10*3/uL (ref 0.0–0.5)
HEMATOCRIT: 32.2 % — AB (ref 38.4–49.9)
HEMOGLOBIN: 9.9 g/dL — AB (ref 13.0–17.1)
Immature Retic Fract: 16.4 % — ABNORMAL HIGH (ref 3.00–10.60)
LYMPH#: 1.4 10*3/uL (ref 0.9–3.3)
LYMPH%: 21.9 % (ref 14.0–49.0)
MCH: 25.2 pg — ABNORMAL LOW (ref 27.2–33.4)
MCHC: 30.7 g/dL — AB (ref 32.0–36.0)
MCV: 81.9 fL (ref 79.3–98.0)
MONO#: 0.7 10*3/uL (ref 0.1–0.9)
MONO%: 11.1 % (ref 0.0–14.0)
NEUT#: 4.1 10*3/uL (ref 1.5–6.5)
NEUT%: 65.1 % (ref 39.0–75.0)
Platelets: 318 10*3/uL (ref 140–400)
RBC: 3.93 10*6/uL — ABNORMAL LOW (ref 4.20–5.82)
RDW: 20.7 % — AB (ref 11.0–14.6)
Retic %: 1.38 % (ref 0.80–1.80)
Retic Ct Abs: 54.23 10*3/uL (ref 34.80–93.90)
WBC: 6.3 10*3/uL (ref 4.0–10.3)

## 2014-09-20 LAB — COMPREHENSIVE METABOLIC PANEL (CC13)
ALT: 14 U/L (ref 0–55)
ANION GAP: 12 meq/L — AB (ref 3–11)
AST: 29 U/L (ref 5–34)
Albumin: 2.4 g/dL — ABNORMAL LOW (ref 3.5–5.0)
Alkaline Phosphatase: 108 U/L (ref 40–150)
BILIRUBIN TOTAL: 0.31 mg/dL (ref 0.20–1.20)
BUN: 13.3 mg/dL (ref 7.0–26.0)
CHLORIDE: 104 meq/L (ref 98–109)
CO2: 24 mEq/L (ref 22–29)
CREATININE: 1.1 mg/dL (ref 0.7–1.3)
Calcium: 8.4 mg/dL (ref 8.4–10.4)
EGFR: 69 mL/min/{1.73_m2} — ABNORMAL LOW (ref >90)
Glucose: 267 mg/dl — ABNORMAL HIGH (ref 70–140)
POTASSIUM: 4.2 meq/L (ref 3.5–5.1)
SODIUM: 140 meq/L (ref 136–145)
TOTAL PROTEIN: 6.6 g/dL (ref 6.4–8.3)

## 2014-09-20 MED ORDER — SODIUM CHLORIDE 0.9 % IV SOLN
510.0000 mg | Freq: Once | INTRAVENOUS | Status: AC
  Administered 2014-09-20: 510 mg via INTRAVENOUS
  Filled 2014-09-20: qty 17

## 2014-09-20 MED ORDER — OXALIPLATIN CHEMO INJECTION 100 MG/20ML
85.0000 mg/m2 | Freq: Once | INTRAVENOUS | Status: AC
  Administered 2014-09-20: 180 mg via INTRAVENOUS
  Filled 2014-09-20: qty 36

## 2014-09-20 MED ORDER — DEXTROSE 5 % IV SOLN
Freq: Once | INTRAVENOUS | Status: AC
  Administered 2014-09-20: 13:00:00 via INTRAVENOUS

## 2014-09-20 MED ORDER — DEXTROSE 5 % IV SOLN
403.0000 mg/m2 | Freq: Once | INTRAVENOUS | Status: AC
  Administered 2014-09-20: 850 mg via INTRAVENOUS
  Filled 2014-09-20: qty 42.5

## 2014-09-20 MED ORDER — FLUOROURACIL CHEMO INJECTION 2.5 GM/50ML
400.0000 mg/m2 | Freq: Once | INTRAVENOUS | Status: AC
  Administered 2014-09-20: 850 mg via INTRAVENOUS
  Filled 2014-09-20: qty 17

## 2014-09-20 MED ORDER — SODIUM CHLORIDE 0.9 % IV SOLN
Freq: Once | INTRAVENOUS | Status: AC
  Administered 2014-09-20: 14:00:00 via INTRAVENOUS
  Filled 2014-09-20: qty 4

## 2014-09-20 MED ORDER — FLUOROURACIL CHEMO INJECTION 5 GM/100ML
2400.0000 mg/m2 | INTRAVENOUS | Status: DC
  Administered 2014-09-20: 5050 mg via INTRAVENOUS
  Filled 2014-09-20: qty 101

## 2014-09-20 NOTE — Telephone Encounter (Signed)
gave and printed appt sched and avs for pt for April and May ....sed added tx. °

## 2014-09-20 NOTE — Patient Instructions (Signed)
[facility] Discharge Instructions for Patients Receiving Chemotherapy  Today you received the following chemotherapy agents Leucovorin, 5-FU, Oxaliplatin.  Patient also received Feraheme today.  To help prevent nausea and vomiting after your treatment, we encourage you to take your nausea medication     If you develop nausea and vomiting that is not controlled by your nausea medication, call the clinic.   BELOW ARE SYMPTOMS THAT SHOULD BE REPORTED IMMEDIATELY:  *FEVER GREATER THAN 100.5 F  *CHILLS WITH OR WITHOUT FEVER  NAUSEA AND VOMITING THAT IS NOT CONTROLLED WITH YOUR NAUSEA MEDICATION  *UNUSUAL SHORTNESS OF BREATH  *UNUSUAL BRUISING OR BLEEDING  TENDERNESS IN MOUTH AND THROAT WITH OR WITHOUT PRESENCE OF ULCERS  *URINARY PROBLEMS  *BOWEL PROBLEMS  UNUSUAL RASH Items with * indicate a potential emergency and should be followed up as soon as possible.  Feel free to call the clinic you have any questions or concerns. The clinic phone number is [phone].  Please show the [facility] at check-in to the Emergency Department and triage nurse.

## 2014-09-20 NOTE — Progress Notes (Signed)
[facility]  Telephone:([phone]) [phone] Fax:([phone]) [phone]  Clinic New Consult Note   Patient Care Team: Seward Carol, MD as PCP - General (Internal Medicine) 09/20/2014  CHIEF COMPLAINTS:  Follow up small bowel cancer metastatic to liver  Oncology History   Small bowel cancer   Staging form: Small Intestine, AJCC 7th Edition     Clinical: Stage IV (TX, N1, M1) - Unsigned       Small bowel cancer   08/17/2014 Imaging CT abdomen/pelvis without contrast showed multiple large masses within the liver and 2.5cm mass within the proximal jejunum.    08/18/2014 Pathology Results Liver biopsy showed metastatic adenocarcinoma, consistent with GI primary   08/18/2014 Initial Diagnosis Small bowel cancer   08/18/2014 Procedure small bowel enteroscopy with biopsy by Dr. Benson Norway showed at the proximal jejunum there was evidence of abnormal mucosa and friability, biopsy was obtained.    09/05/2014 Imaging PET hypermetabolic mass involving a small bowel loop with adjacent mesenteric lymphadenopathy, and diffuse liver metastasis and mild Pallor metabolic lymphadenopathy in porta hepatis.   09/06/2014 -  Chemotherapy mFOLFOX      HISTORY OF PRESENTING ILLNESS:  Tyler Evans 69 y.o. male is here because of a recent diagnosis of small bowel cancer. He presented to his PCP on 08/17/2014 with complaints of feeling weak and a one month history of a cough. He was found to be anemic and was referred to the emergency department. Hemoglobin was found to be 7.6, MCV 76.6; creatinine was elevated at 1.74. Stool was Hemoccult positive.  CT abdomen/pelvis without contrast on 08/17/2014 showed multiple large masses within the liver, bulky in appearance. The largest measured approximately 5.7 cm. There appeared to be a focal filling defect within the proximal jejunum measuring approximately 2.5 x 1.7 cm. There was mild adjacent jejunal wall thickening. The lung bases were clear.  On 08/18/2014 he underwent  a small bowel enteroscopy with biopsy by Dr. Benson Norway. The esophagus and gastric lumen were normal. At the proximal jejunum there was evidence of abnormal mucosa and friability. The pediatric colonoscope was not able to traverse the area. Biopsies were obtained. An ultraslim colonoscope was then utilized. This colonoscope was able to traverse the area of stenosis which measured approximately 3 cm in length. 50% of the lumen was ulcerated. 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 MMR stains are pending.  He was discharged home on 08/19/2014.  INTERIM HISTORY:  Mr. Punch returns for follow-up and the second cycle chemotherapy. He developed fever the day after for cycle chemotherapy, and was admitted to hospital. ID workup was negative. He was also found to have profound anemia and GI bleeding, and received 2 units of RBC in the hospital. He was discharged to home after a few days. He feels better lately, normal GI bleeding episodes. He has very good appetite, no other new complaints.  MEDICAL HISTORY:  Past Medical History  Diagnosis Date  . Diabetes mellitus without complication   . Hypertension   . Small bowel cancer 08/18/2014    SURGICAL HISTORY: Past Surgical History  Procedure Laterality Date  . Enteroscopy N/A 08/18/2014    Procedure: ENTEROSCOPY;  Surgeon: Carol Ada, MD;  Location: [facility];  Service: Endoscopy;  Laterality: N/A;    SOCIAL HISTORY: History   Social History  . Marital Status: Married    Spouse Name: N/A  . Number of Children: 2  . Years of Education: N/A   Occupational History  .  retired Ambulance person  Social History Main Topics  . Smoking status: Never Smoker   . Smokeless tobacco: Not on file  . Alcohol Use: No  . Drug Use: Not on file  . Sexual Activity: Not on file   Other Topics Concern  . Not on file   Social History Narrative  He lives in [place]. He is married. He has 2 children both  reported to be in good health. No tobacco or alcohol use. He is a retired Ambulance person.  FAMILY HISTORY: Family History  Problem Relation Age of Onset  . Heart failure, Alzheimer's  Mother   . Lung cancer Father   . Kidney cancer Brother     ALLERGIES:  is allergic to penicillins.  MEDICATIONS:  Current Outpatient Prescriptions  Medication Sig Dispense Refill  . atorvastatin (LIPITOR) 20 MG tablet Take 20 mg by mouth daily.    Marland Kitchen glipiZIDE (GLUCOTROL) 10 MG tablet Take 10 mg by mouth 2 (two) times daily before a meal.    . insulin glargine (LANTUS) 100 UNIT/ML injection Inject 40 Units into the skin at bedtime.    . pantoprazole (PROTONIX) 40 MG tablet Take 1 tablet (40 mg total) by mouth daily. 30 tablet 0  . chlorthalidone (HYGROTON) 25 MG tablet Take 25 mg by mouth daily.    Marland Kitchen lisinopril (PRINIVIL,ZESTRIL) 40 MG tablet Take 40 mg by mouth at bedtime.    . metFORMIN (GLUCOPHAGE) 1000 MG tablet Take 1,000 mg by mouth 2 (two) times daily with a meal.         REVIEW OF SYSTEMS:   Constitutional: Denies fevers, chills or abnormal night sweats; poor appetite. He estimates 20 pound weight loss over the past month Eyes: Denies blurriness of vision, double vision or watery eyes Ears, nose, mouth, throat, and face: Denies mucositis or sore throat Respiratory: One month history of a nonproductive cough;  no shortness of breath Cardiovascular: Denies palpitation, chest discomfort or lower extremity swelling Gastrointestinal:  Denies nausea, heartburn. No dysphagia. He has noted less frequent bowel movements over the past 2 weeks. Does not characterize this as constipation. Skin: Denies abnormal skin rashes Lymphatics: Denies new lymphadenopathy or easy bruising Neurological: Denies numbness, tingling. No extremity weakness Behavioral/Psych: Mood is stable, no new changes  All other systems were reviewed with the patient and are negative.  PHYSICAL EXAMINATION: ECOG PERFORMANCE STATUS:  1 - Symptomatic but completely ambulatory  Filed Vitals:   09/20/14 1027  BP: 131/62  Pulse: 79  Temp: 98.1 F (36.7 C)  Resp: 18   Filed Weights   09/20/14 1027  Weight: 210 lb 6.4 oz (95.437 kg)    GENERAL:alert, no distress and comfortable SKIN: skin color, texture, turgor are normal, no rashes or significant lesions EYES: normal, conjunctiva are pink and non-injected, sclera clear OROPHARYNX:no exudate, no erythema and lips, buccal mucosa, and tongue normal  NECK: supple, thyroid normal size, non-tender, without nodularity LYMPH:  no palpable lymphadenopathy in the cervical, axillary or inguinal regions LUNGS: clear to auscultation and percussion with normal breathing effort HEART: regular rate & rhythm and no murmurs and no lower extremity edema ABDOMEN:abdomen soft, mild tenderness at the right upper quadrant , no rebound pain, normal bowel sounds Musculoskeletal:no cyanosis of digits and no clubbing  PSYCH: alert & oriented x 3 with fluent speech NEURO: no focal motor/sensory deficits  LABORATORY DATA:  I have reviewed the data as listed CBC Latest Ref Rng 09/20/2014 09/13/2014 09/10/2014  WBC 4.0 - 10.3 10e3/uL 6.3 16.6(H) 12.3(H)  Hemoglobin 13.0 -  17.1 g/dL 9.9(L) 9.1(L) 8.7(L)  Hematocrit 38.4 - 49.9 % 32.2(L) 28.3(L) 27.4(L)  Platelets 140 - 400 10e3/uL 318 366 261    CMP Latest Ref Rng 09/20/2014 09/13/2014 09/10/2014  Glucose 70 - 140 mg/dl 267(H) 217(H) 199(H)  BUN 7.0 - 26.0 mg/dL 13.3 16.5 17  Creatinine 0.7 - 1.3 mg/dL 1.1 1.2 1.35  Sodium 136 - 145 mEq/L 140 138 136  Potassium 3.5 - 5.1 mEq/L 4.2 4.0 4.0  Chloride 96 - 112 mmol/L - - 104  CO2 22 - 29 mEq/L 24 24 26   Calcium 8.4 - 10.4 mg/dL 8.4 8.0(L) 7.9(L)  Total Protein 6.4 - 8.3 g/dL 6.6 6.2(L) -  Total Bilirubin 0.20 - 1.20 mg/dL 0.31 0.36 -  Alkaline Phos 40 - 150 U/L 108 108 -  AST 5 - 34 U/L 29 34 -  ALT 0 - 55 U/L 14 19 -    Pathology report:  Small Intestine Biopsy, jejunal mass  08/18/2014 - INVASIVE ADENOCARCINOMA IN A BACKGROUND OF TUBULOVILLOUS ADENOMA. ADDITIONAL INFORMATION: Mismatch Repair (MMR) Protein Immunohistochemistry (IHC) IHC Expression Result (LIMITED TUMOR): MLH1: Preserved nuclear expression (greater 50% tumor expression) MSH2: Preserved nuclear expression (greater 50% tumor expression) MSH6: Preserved nuclear expression (greater 50% tumor expression) PMS2: Preserved nuclear expression (greater 50% tumor expression) * Internal control demonstrates intact nuclear expression Interpretation: NORMAL There is preserved expression  Diagnosis 08/28/2014  Liver, needle/core biopsy, right lobe - METASTATIC ADEN  RADIOGRAPHIC STUDIES: I have personally reviewed the radiological images as listed and agreed with the findings in the report.  Nm Pet Image Initial (pi) Skull Base To Thigh 09/05/2014    IMPRESSION: Hypermetabolic mass involving a small bowel loop with adjacent mesenteric lymphadenopathy in the left abdomen, consistent with known primary small bowel carcinoma. No evidence of small bowel obstruction.  Diffuse liver metastases. Mild hypermetabolic lymphadenopathy in porta hepatis, also suspicious for metastatic disease.  No evidence of metastatic disease within the neck, chest, or pelvis.  Incidental findings including cholelithiasis, diverticulosis, and mildly enlarged prostate.   Electronically Signed   By: Earle Gell M.D.   On: 09/05/2014 17:29    ASSESSMENT & PLAN:  69 year old gentleman with past medical history of diabetes, hypertension, who presents with anemia and weakness.  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  -Unfortunately he has stage IV disease with diffuse liver metastasis, this is a incurable disease, with overall very poor prognosis. -I recommend systemic chemotherapy for FOLFOX,  with a goal of palliation and prolonged his life. -Given his impaired renal function, I'll omit the 5-FU bolus -Restaging after 4 cycles of chemotherapy -I have requested his tumor to be tested for K-ras NRAS mutation, to see if he would be a candidate for EGFR targeted therapy - Lab reviewed, adequate for treatment. We'll proceed cycle 2 today -We discussed that he may need surgery or radiation if he has recurrent severe GI bleeding. He agrees. Hopefully the chemotherapy will control his cancer, and prevent further bleeding episodes.   2. Microcytic anemia secondary to GI bleeding and iron deficiency -His unknown level and saturation were low, although ferritin is normal, he has some degree of iron deficient anemia, and anemia of malignancy  -His hemoglobin was 8.4 last week, but he is not very somatic. We'll follow his CBC next week. -Consider blood transfusion if hemoglobin less than 8 or symptomatic anemia -IV feraheme 510 mg  twice, last week and today  -continue ferrous sulfate 1-2 tablet daily   3. anorexia/weight loss secondary to #1. -I encouraged him to take some nutritional supplement  4. Hypertension and DM  -He will continue follow-up with his primary care physician. -We discussed that we will monitor his blood pressure and blood sugar closely and may need to adjust his medication during the chemotherapy   5. CKD, stage III -His creatinine has improved after his medication adjustment -I encouraged him to drink adequately and avoid dehydration. We discussed he may have more side effects from chemotherapy due to his kidney problem. -I'll adjust his chemotherapy dose as needed based on his kidney function. I'll omit the 5-FU bolus for for cycle and keep the rest full dose.   Plan: -FOLFOX cycle 2 today, no 5 FU bolus. No Neulasta on day 3 with this cycle  -RTC in 2 weeks    Truitt Merle  09/20/2014

## 2014-09-21 ENCOUNTER — Other Ambulatory Visit (HOSPITAL_COMMUNITY)
Admission: RE | Admit: 2014-09-21 | Discharge: 2014-09-21 | Disposition: A | Discharge status: Home / Self Care | Payer: Medicare Other | Source: Ambulatory Visit | Attending: Hematology | Admitting: Hematology

## 2014-09-21 DIAGNOSIS — C179 Malignant neoplasm of small intestine, unspecified: Secondary | ICD-10-CM | POA: Insufficient documentation

## 2014-09-22 ENCOUNTER — Ambulatory Visit (HOSPITAL_BASED_OUTPATIENT_CLINIC_OR_DEPARTMENT_OTHER): Payer: Medicare Other

## 2014-09-22 VITALS — BP 134/57 | HR 73 | Temp 98.6°F

## 2014-09-22 DIAGNOSIS — C787 Secondary malignant neoplasm of liver and intrahepatic bile duct: Secondary | ICD-10-CM | POA: Diagnosis not present

## 2014-09-22 DIAGNOSIS — C171 Malignant neoplasm of jejunum: Secondary | ICD-10-CM

## 2014-09-22 DIAGNOSIS — C179 Malignant neoplasm of small intestine, unspecified: Secondary | ICD-10-CM

## 2014-09-22 MED ORDER — SODIUM CHLORIDE 0.9 % IJ SOLN
10.0000 mL | INTRAMUSCULAR | Status: DC | PRN
  Administered 2014-09-22: 10 mL
  Filled 2014-09-22: qty 10

## 2014-09-22 MED ORDER — HEPARIN SOD (PORK) LOCK FLUSH 100 UNIT/ML IV SOLN
500.0000 [IU] | Freq: Once | INTRAVENOUS | Status: AC | PRN
  Administered 2014-09-22: 500 [IU]
  Filled 2014-09-22: qty 5

## 2014-09-22 NOTE — Patient Instructions (Signed)
Fluorouracil, 5FU; Diclofenac topical cream What is this medicine? FLUOROURACIL; DICLOFENAC (flure oh YOOR a sil; dye KLOE fen ak) is a combination of a topical chemotherapy agent and non-steroidal anti-inflammatory drug (NSAID). It is used on the skin to treat skin cancer and skin conditions that could become cancer. This medicine may be used for other purposes; ask your health care provider or pharmacist if you have questions. COMMON BRAND NAME(S): FLUORAC What should I tell my health care provider before I take this medicine? They need to know if you have any of these conditions: -bleeding problems -cigarette smoker -DPD enzyme deficiency -heart disease -high blood pressure -if you frequently drink alcohol containing drinks -kidney disease -liver disease -open or infected skin -stomach problems -swelling or open sores at the treatment site -recent or planned coronary artery bypass graft (CABG) surgery -an unusual or allergic reaction to fluorouracil, diclofenac, aspirin, other NSAIDs, other medicines, foods, dyes, or preservatives -pregnant or trying to get pregnant -breast-feeding How should I use this medicine? This medicine is only for use on the skin. Follow the directions on the prescription label. Wash hands before and after use. Wash affected area and gently pat dry. To apply this medicine use a cotton-tipped applicator, or use gloves if applying with fingertips. If applied with unprotected fingertips, it is very important to wash your hands well after you apply this medicine. Avoid applying to the eyes, nose, or mouth. Apply enough medicine to cover the affected area. You can cover the area with a light gauze dressing, but do not use tight or air-tight dressings. Finish the full course prescribed by your doctor or health care professional, even if you think your condition is better. Do not stop taking except on the advice of your doctor or health care professional. Talk to your  pediatrician regarding the use of this medicine in children. Special care may be needed. Overdosage: If you think you've taken too much of this medicine contact a poison control center or emergency room at once. Overdosage: If you think you have taken too much of this medicine contact a poison control center or emergency room at once. NOTE: This medicine is only for you. Do not share this medicine with others. What if I miss a dose? If you miss a dose, apply it as soon as you can. If it is almost time for your next dose, only use that dose. Do not apply extra doses. Contact your doctor or health care professional if you miss more than one dose. What may interact with this medicine? Interactions are not expected. Do not use any other skin products without telling your doctor or health care professional. This list may not describe all possible interactions. Give your health care provider a list of all the medicines, herbs, non-prescription drugs, or dietary supplements you use. Also tell them if you smoke, drink alcohol, or use illegal drugs. Some items may interact with your medicine. What should I watch for while using this medicine? Visit your doctor or health care professional for checks on your progress. You will need to use this medicine for 2 to 6 weeks. This may be longer depending on the condition being treated. You may not see full healing for another 1 to 2 months after you stop using the medicine. Treated areas of skin can look unsightly during and for several weeks after treatment with this medicine. This medicine can make you more sensitive to the sun. Keep out of the sun. If you cannot avoid being in   the sun, wear protective clothing and use sunscreen. Do not use sun lamps or tanning beds/booths. Where should I keep my What side effects may I notice from receiving this medicine? Side effects that you should report to your doctor or health care professional as soon as possible: -allergic  reactions like skin rash, itching or hives, swelling of the face, lips, or tongue -black or bloody stools, blood in the urine or vomit -blurred vision -chest pain -difficulty breathing or wheezing -redness, blistering, peeling or loosening of the skin, including inside the mouth -severe redness and swelling of normal skin -slurred speech or weakness on one side of the body -trouble passing urine or change in the amount of urine -unexplained weight gain or swelling -unusually weak or tired -yellowing of eyes or skin Side effects that usually do not require medical attention (Report these to your doctor or health care professional if they continue or are bothersome.): -increased sensitivity of the skin to sun and ultraviolet light -pain and burning of the affected area -scaling or swelling of the affected area -skin rash, itching of the affected area -tenderness This list may not describe all possible side effects. Call your doctor for medical advice about side effects. You may report side effects to FDA at 1-800-FDA-1088. Where should I keep my medicine? Keep out of the reach of children. Store at room temperature between 20 and 25 degrees C (68 and 77 degrees F). Throw away any unused medicine after the expiration date. NOTE: This sheet is a summary. It may not cover all possible information. If you have questions about this medicine, talk to your doctor, pharmacist, or health care provider.  2015, Elsevier/Gold Standard. (2013-09-19 11:09:58)  

## 2014-09-26 ENCOUNTER — Ambulatory Visit: Payer: Self-pay | Admitting: Interventional Cardiology

## 2014-09-29 ENCOUNTER — Encounter (HOSPITAL_COMMUNITY): Payer: Self-pay

## 2014-10-03 ENCOUNTER — Telehealth: Payer: Self-pay | Admitting: Hematology

## 2014-10-03 ENCOUNTER — Other Ambulatory Visit (HOSPITAL_BASED_OUTPATIENT_CLINIC_OR_DEPARTMENT_OTHER): Payer: Medicare Other

## 2014-10-03 ENCOUNTER — Encounter: Payer: Self-pay | Admitting: Hematology

## 2014-10-03 ENCOUNTER — Ambulatory Visit (HOSPITAL_BASED_OUTPATIENT_CLINIC_OR_DEPARTMENT_OTHER): Payer: Medicare Other | Admitting: Hematology

## 2014-10-03 VITALS — BP 144/69 | HR 76 | Temp 97.7°F | Resp 18 | Ht 66.0 in | Wt 205.4 lb

## 2014-10-03 DIAGNOSIS — D500 Iron deficiency anemia secondary to blood loss (chronic): Secondary | ICD-10-CM

## 2014-10-03 DIAGNOSIS — R599 Enlarged lymph nodes, unspecified: Secondary | ICD-10-CM | POA: Diagnosis not present

## 2014-10-03 DIAGNOSIS — C787 Secondary malignant neoplasm of liver and intrahepatic bile duct: Secondary | ICD-10-CM | POA: Diagnosis not present

## 2014-10-03 DIAGNOSIS — D509 Iron deficiency anemia, unspecified: Secondary | ICD-10-CM

## 2014-10-03 DIAGNOSIS — C171 Malignant neoplasm of jejunum: Secondary | ICD-10-CM

## 2014-10-03 DIAGNOSIS — R630 Anorexia: Secondary | ICD-10-CM

## 2014-10-03 DIAGNOSIS — E119 Type 2 diabetes mellitus without complications: Secondary | ICD-10-CM

## 2014-10-03 DIAGNOSIS — C179 Malignant neoplasm of small intestine, unspecified: Secondary | ICD-10-CM

## 2014-10-03 DIAGNOSIS — I10 Essential (primary) hypertension: Secondary | ICD-10-CM

## 2014-10-03 DIAGNOSIS — N183 Chronic kidney disease, stage 3 (moderate): Secondary | ICD-10-CM | POA: Diagnosis not present

## 2014-10-03 DIAGNOSIS — R634 Abnormal weight loss: Secondary | ICD-10-CM

## 2014-10-03 LAB — COMPREHENSIVE METABOLIC PANEL (CC13)
ALBUMIN: 2.8 g/dL — AB (ref 3.5–5.0)
ALK PHOS: 94 U/L (ref 40–150)
ALT: 15 U/L (ref 0–55)
ANION GAP: 9 meq/L (ref 3–11)
AST: 27 U/L (ref 5–34)
BUN: 10.7 mg/dL (ref 7.0–26.0)
CO2: 26 mEq/L (ref 22–29)
Calcium: 8.9 mg/dL (ref 8.4–10.4)
Chloride: 105 mEq/L (ref 98–109)
Creatinine: 1.1 mg/dL (ref 0.7–1.3)
EGFR: 69 mL/min/{1.73_m2} — AB (ref >90)
Glucose: 109 mg/dl (ref 70–140)
POTASSIUM: 4 meq/L (ref 3.5–5.1)
SODIUM: 140 meq/L (ref 136–145)
Total Bilirubin: 0.44 mg/dL (ref 0.20–1.20)
Total Protein: 6.7 g/dL (ref 6.4–8.3)

## 2014-10-03 LAB — CBC & DIFF AND RETIC
BASO%: 0.4 % (ref 0.0–2.0)
Basophils Absolute: 0 10*3/uL (ref 0.0–0.1)
EOS ABS: 0.1 10*3/uL (ref 0.0–0.5)
EOS%: 1.6 % (ref 0.0–7.0)
HEMATOCRIT: 34.8 % — AB (ref 38.4–49.9)
HGB: 10.9 g/dL — ABNORMAL LOW (ref 13.0–17.1)
IMMATURE RETIC FRACT: 16.2 % — AB (ref 3.00–10.60)
LYMPH%: 36.1 % (ref 14.0–49.0)
MCH: 26.2 pg — AB (ref 27.2–33.4)
MCHC: 31.3 g/dL — AB (ref 32.0–36.0)
MCV: 83.7 fL (ref 79.3–98.0)
MONO#: 0.6 10*3/uL (ref 0.1–0.9)
MONO%: 11.8 % (ref 0.0–14.0)
NEUT#: 2.5 10*3/uL (ref 1.5–6.5)
NEUT%: 50.1 % (ref 39.0–75.0)
Platelets: 140 10*3/uL (ref 140–400)
RBC: 4.16 10*6/uL — AB (ref 4.20–5.82)
RDW: 21.9 % — ABNORMAL HIGH (ref 11.0–14.6)
Retic %: 1.76 % (ref 0.80–1.80)
Retic Ct Abs: 73.22 10*3/uL (ref 34.80–93.90)
WBC: 5 10*3/uL (ref 4.0–10.3)
lymph#: 1.8 10*3/uL (ref 0.9–3.3)

## 2014-10-03 LAB — TECHNOLOGIST REVIEW

## 2014-10-03 NOTE — Progress Notes (Signed)
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    08/18/2014 Miscellaneous tumor KRAS mutation (-)   08/18/2014 Pathology Results Liver biopsy showed metastatic adenocarcinoma, consistent with GI primary   08/18/2014 Initial Diagnosis Small bowel cancer   08/18/2014 Procedure small bowel enteroscopy with biopsy by Dr. Benson Norway showed at the proximal jejunum there was evidence of abnormal mucosa and friability, biopsy was obtained.    09/05/2014 Imaging PET hypermetabolic mass involving a small bowel loop with adjacent mesenteric lymphadenopathy, and diffuse liver metastasis and mild Pallor metabolic lymphadenopathy in porta hepatis.   09/06/2014 -  Chemotherapy mFOLFOX    09/07/2014 - 09/11/2014 Hospital Admission He was admitted for fever and GI bleeding. Received 3 units of RBC.     HISTORY OF PRESENTING ILLNESS:  BRETTON Evans 69 y.o. male is here because of a recent diagnosis of small bowel cancer. He presented to his PCP on 08/17/2014 with complaints of feeling weak and a one month history of a cough. He was found to be anemic and was referred to the emergency department. Hemoglobin was found to be 7.6, MCV 76.6; creatinine was elevated at 1.74. Stool was Hemoccult positive.  CT abdomen/pelvis without contrast on 08/17/2014 showed multiple large masses within the liver, bulky in appearance. The largest measured approximately 5.7 cm. There appeared to be a focal filling defect within the  proximal jejunum measuring approximately 2.5 x 1.7 cm. There was mild adjacent jejunal wall thickening. The lung bases were clear.  On 08/18/2014 he underwent a small bowel enteroscopy with biopsy by Dr. Benson Norway. The esophagus and gastric lumen were normal. At the proximal jejunum there was evidence of abnormal mucosa and friability. The pediatric colonoscope was not able to traverse the area. Biopsies were obtained. An ultraslim colonoscope was then utilized. This colonoscope was able to traverse the area of stenosis which measured approximately 3 cm in length. 50% of the lumen was ulcerated. 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 MMR stains are pending.  He was discharged home on 08/19/2014.  INTERIM HISTORY:  Mr. Swor returns for follow-up before his third cycle chemotherapy. He tolerated the second cycle chemotherapy very well, no significant fatigue, nausea, diarrhea or other noticeable side effects. He has being and drinking well. No fever or chills. His bowel movement is normal, no hematochezia or melana.   MEDICAL HISTORY:  Past Medical History  Diagnosis Date  . Diabetes mellitus without complication   . Hypertension   . Small bowel cancer 08/18/2014    SURGICAL HISTORY: Past Surgical History  Procedure Laterality Date  . Enteroscopy N/A 08/18/2014    Procedure: ENTEROSCOPY;  Surgeon: Carol Ada, MD;  Location: [facility];  Service: Endoscopy;  Laterality: N/A;    SOCIAL HISTORY: History   Social History  . Marital Status: Married    Spouse Name: N/A  . Number of Children: 2  . Years of Education: N/A   Occupational History  .  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 Smoking status: Never Smoker   . Smokeless tobacco: Not on file  . Alcohol Use: No  . Drug Use: Not on file  . Sexual Activity: Not on file   Other Topics Concern  . Not on file   Social History Narrative  He lives in [place]. He is  married. He has 2 children both reported to be in good health. No tobacco or alcohol use. He is a retired Ambulance person.  FAMILY HISTORY: Family History  Problem Relation Age of Onset  . Heart failure, Alzheimer's  Mother   . Lung cancer Father   . Kidney cancer Brother     ALLERGIES:  is allergic to penicillins.  MEDICATIONS:  Current Outpatient Prescriptions  Medication Sig Dispense Refill  . atorvastatin (LIPITOR) 20 MG tablet Take 20 mg by mouth daily.    Marland Kitchen glipiZIDE (GLUCOTROL) 10 MG tablet Take 10 mg by mouth 2 (two) times daily before a meal.    . insulin glargine (LANTUS) 100 UNIT/ML injection Inject 40 Units into the skin at bedtime.    . pantoprazole (PROTONIX) 40 MG tablet Take 1 tablet (40 mg total) by mouth daily. 30 tablet 0  . chlorthalidone (HYGROTON) 25 MG tablet Take 25 mg by mouth daily.    Marland Kitchen lisinopril (PRINIVIL,ZESTRIL) 40 MG tablet Take 40 mg by mouth at bedtime.    . metFORMIN (GLUCOPHAGE) 1000 MG tablet Take 1,000 mg by mouth 2 (two) times daily with a meal.         REVIEW OF SYSTEMS:   Constitutional: Denies fevers, chills or abnormal night sweats; poor appetite. He estimates 20 pound weight loss over the past month Eyes: Denies blurriness of vision, double vision or watery eyes Ears, nose, mouth, throat, and face: Denies mucositis or sore throat Respiratory: One month history of a nonproductive cough;  no shortness of breath Cardiovascular: Denies palpitation, chest discomfort or lower extremity swelling Gastrointestinal:  Denies nausea, heartburn. No dysphagia. He has noted less frequent bowel movements over the past 2 weeks. Does not characterize this as constipation. Skin: Denies abnormal skin rashes Lymphatics: Denies new lymphadenopathy or easy bruising Neurological: Denies numbness, tingling. No extremity weakness Behavioral/Psych: Mood is stable, no new changes  All other systems were reviewed with the patient and are negative.  PHYSICAL  EXAMINATION: ECOG PERFORMANCE STATUS: 1 - Symptomatic but completely ambulatory  Filed Vitals:   10/03/14 0845  BP: 144/69  Pulse: 76  Temp: 97.7 F (36.5 C)  Resp: 18   Filed Weights   10/03/14 0845  Weight: 205 lb 6.4 oz (93.169 kg)    GENERAL:alert, no distress and comfortable SKIN: skin color, texture, turgor are normal, no rashes or significant lesions EYES: normal, conjunctiva are pink and non-injected, sclera clear OROPHARYNX:no exudate, no erythema and lips, buccal mucosa, and tongue normal  NECK: supple, thyroid normal size, non-tender, without nodularity LYMPH:  no palpable lymphadenopathy in the cervical, axillary or inguinal regions LUNGS: clear to auscultation and percussion with normal breathing effort HEART: regular rate & rhythm and no murmurs and no lower extremity edema ABDOMEN:abdomen soft, mild tenderness at the right upper quadrant , no rebound pain, normal bowel sounds Musculoskeletal:no cyanosis of digits and no clubbing  PSYCH: alert & oriented x 3 with fluent speech NEURO: no focal motor/sensory deficits  LABORATORY DATA:  I have reviewed the data as listed CBC Latest Ref Rng 10/03/2014 09/20/2014 09/13/2014  WBC 4.0 - 10.3 10e3/uL 5.0 6.3  16.6(H)  Hemoglobin 13.0 - 17.1 g/dL 10.9(L) 9.9(L) 9.1(L)  Hematocrit 38.4 - 49.9 % 34.8(L) 32.2(L) 28.3(L)  Platelets 140 - 400 10e3/uL 140 318 366    CMP Latest Ref Rng 10/03/2014 09/20/2014 09/13/2014  Glucose 70 - 140 mg/dl 109 267(H) 217(H)  BUN 7.0 - 26.0 mg/dL 10.7 13.3 16.5  Creatinine 0.7 - 1.3 mg/dL 1.1 1.1 1.2  Sodium 136 - 145 mEq/L 140 140 138  Potassium 3.5 - 5.1 mEq/L 4.0 4.2 4.0  Chloride 96 - 112 mmol/L - - -  CO2 22 - 29 mEq/L $Remove'26 24 24  'KloYzAE$ Calcium 8.4 - 10.4 mg/dL 8.9 8.4 8.0(L)  Total Protein 6.4 - 8.3 g/dL 6.7 6.6 6.2(L)  Total Bilirubin 0.20 - 1.20 mg/dL 0.44 0.31 0.36  Alkaline Phos 40 - 150 U/L 94 108 108  AST 5 - 34 U/L 27 29 34  ALT 0 - 55 U/L $Remo'15 14 19    'HSdyl$ Pathology report:  Small  Intestine Biopsy, jejunal mass 08/18/2014 - INVASIVE ADENOCARCINOMA IN A BACKGROUND OF TUBULOVILLOUS ADENOMA. ADDITIONAL INFORMATION: Mismatch Repair (MMR) Protein Immunohistochemistry (IHC) IHC Expression Result (LIMITED TUMOR): MLH1: Preserved nuclear expression (greater 50% tumor expression) MSH2: Preserved nuclear expression (greater 50% tumor expression) MSH6: Preserved nuclear expression (greater 50% tumor expression) PMS2: Preserved nuclear expression (greater 50% tumor expression) * Internal control demonstrates intact nuclear expression Interpretation: NORMAL There is preserved expression  Diagnosis 08/28/2014  Liver, needle/core biopsy, right lobe - METASTATIC ADEN  RADIOGRAPHIC STUDIES: I have personally reviewed the radiological images as listed and agreed with the findings in the report.  Nm Pet Image Initial (pi) Skull Base To Thigh 09/05/2014    IMPRESSION: Hypermetabolic mass involving a small bowel loop with adjacent mesenteric lymphadenopathy in the left abdomen, consistent with known primary small bowel carcinoma. No evidence of small bowel obstruction.  Diffuse liver metastases. Mild hypermetabolic lymphadenopathy in porta hepatis, also suspicious for metastatic disease.  No evidence of metastatic disease within the neck, chest, or pelvis.  Incidental findings including cholelithiasis, diverticulosis, and mildly enlarged prostate.   Electronically Signed   By: Earle Gell M.D.   On: 09/05/2014 17:29    ASSESSMENT & PLAN:  69 year old gentleman with past medical history of diabetes, hypertension, who presents with anemia and weakness.  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  -Unfortunately he has stage IV disease with diffuse liver metastasis, this is a incurable disease, with overall very poor prognosis. -I recommend  systemic chemotherapy for FOLFOX, with a goal of palliation and prolonged his life. -Restaging after 4 cycles of chemotherapy -His tumor was tested for K-ras mutation which was negative, NRAS mutation result is still pending. I would consider EGFR targeted therapy if NRAS neagative, either with first line (if restaging suboptimal response) or with second line chemo.  - Lab reviewed, adequate for treatment. We'll proceed cycle 3 tomorrow  -We discussed that he may need surgery or radiation if he has recurrent severe GI bleeding. He agrees. Hopefully the chemotherapy will control his cancer, and prevent further bleeding episodes.   2. Microcytic anemia secondary to GI bleeding and iron deficiency -His serum iron level and saturation were low, although ferritin is normal, he has some degree of iron deficient anemia, and anemia of malignancy  -Consider blood transfusion if hemoglobin less than 8 or symptomatic anemia -he received IV feraheme 510 mg twice in 09/2014 -continue  ferrous sulfate 1-2 tablet daily  -His hemoglobin is 10.9 today, improved after IV Feraheme.  3. anorexia/weight loss secondary to #1. -I encouraged him to take some nutritional supplement  4. Hypertension and DM  -He will continue follow-up with his primary care physician. -We discussed that we will monitor his blood pressure and blood sugar closely and may need to adjust his medication during the chemotherapy   5. CKD, stage III -He is currently stable at 1.1, improved overall  -I encouraged him to drink adequately and avoid dehydration. We discussed he may have more side effects from chemotherapy due to his kidney problem. -I'll adjust his chemotherapy dose as needed based on his kidney function.   Plan: -FOLFOX cycle 3 tomorrow, full dose. No Neulasta on day 3 with this cycle  -RTC in 2 weeks, he likes to get lab and M.D. Visit the day before his chemotherapy. -restaging CT after next cycle chemo    Truitt Merle    10/03/2014

## 2014-10-03 NOTE — Telephone Encounter (Signed)
Gave patient avs report and appointments for May and June.  °

## 2014-10-04 ENCOUNTER — Ambulatory Visit (HOSPITAL_BASED_OUTPATIENT_CLINIC_OR_DEPARTMENT_OTHER): Payer: Medicare Other

## 2014-10-04 ENCOUNTER — Ambulatory Visit: Payer: Medicare Other | Admitting: Nutrition

## 2014-10-04 ENCOUNTER — Other Ambulatory Visit: Payer: Medicare Other

## 2014-10-04 VITALS — BP 137/61 | HR 71 | Temp 98.3°F

## 2014-10-04 DIAGNOSIS — C787 Secondary malignant neoplasm of liver and intrahepatic bile duct: Secondary | ICD-10-CM

## 2014-10-04 DIAGNOSIS — C171 Malignant neoplasm of jejunum: Secondary | ICD-10-CM

## 2014-10-04 DIAGNOSIS — Z5111 Encounter for antineoplastic chemotherapy: Secondary | ICD-10-CM

## 2014-10-04 DIAGNOSIS — C179 Malignant neoplasm of small intestine, unspecified: Secondary | ICD-10-CM

## 2014-10-04 MED ORDER — FLUOROURACIL CHEMO INJECTION 2.5 GM/50ML
400.0000 mg/m2 | Freq: Once | INTRAVENOUS | Status: AC
  Administered 2014-10-04: 850 mg via INTRAVENOUS
  Filled 2014-10-04: qty 17

## 2014-10-04 MED ORDER — OXALIPLATIN CHEMO INJECTION 100 MG/20ML
85.0000 mg/m2 | Freq: Once | INTRAVENOUS | Status: AC
  Administered 2014-10-04: 180 mg via INTRAVENOUS
  Filled 2014-10-04: qty 36

## 2014-10-04 MED ORDER — DEXTROSE 5 % IV SOLN
Freq: Once | INTRAVENOUS | Status: AC
  Administered 2014-10-04: 09:00:00 via INTRAVENOUS

## 2014-10-04 MED ORDER — LEUCOVORIN CALCIUM INJECTION 350 MG
403.0000 mg/m2 | Freq: Once | INTRAVENOUS | Status: AC
  Administered 2014-10-04: 850 mg via INTRAVENOUS
  Filled 2014-10-04: qty 42.5

## 2014-10-04 MED ORDER — SODIUM CHLORIDE 0.9 % IV SOLN
2370.0000 mg/m2 | INTRAVENOUS | Status: DC
  Administered 2014-10-04: 5000 mg via INTRAVENOUS
  Filled 2014-10-04: qty 100

## 2014-10-04 MED ORDER — SODIUM CHLORIDE 0.9 % IV SOLN
Freq: Once | INTRAVENOUS | Status: AC
  Administered 2014-10-04: 09:00:00 via INTRAVENOUS
  Filled 2014-10-04: qty 4

## 2014-10-04 NOTE — Progress Notes (Signed)
69 year old male diagnosed with small bowel cancer with metastases to the liver.  He is a patient of Dr. Burr Medico.  Past medical history includes diabetes and hypertension.  Medications include Lipitor, humulog, Lantus, Zofran, and Protonix.  Labs include albumin 2.8 and glucose 109 on May 3.  Height: 6 inches. Weight: 205.4 pounds May 3. Usual body weight: 229 pounds per patient.  Patient weight 213 pounds March 18. BMI: 33.17.  Patient denies nausea and vomiting and diarrhea. He has occasional constipation.   He is receiving FOLFOX and describes sensitivity for 4-5 days after oxaliplatin. Patient typically eats 3 meals daily but does not snack. He occasionally drinks Glucerna reporting one bottle every other day.  Nutrition diagnosis: Unintended weight loss related to diagnosis of small bowel cancer as evidenced by 10% weight loss from usual body weight.  Intervention: Patient educated to consume small frequent meals and snacks to promote weight maintenance. Encouraged patient to increase protein at meals and at snacks with high protein foods. Provided fact sheet on increasing calories and protein. Recommended patient consume Glucerna once daily.  Provided coupons. Questions were answered.  Teach back method used.  Monitoring, evaluation, goals: Patient will increase calories and protein to promote weight maintenance with minimal side effects.  Next visit: Wednesday, May 18, during infusion.  **Disclaimer: This note was dictated with voice recognition software. Similar sounding words can inadvertently be transcribed and this note may contain transcription errors which may not have been corrected upon publication of note.**

## 2014-10-04 NOTE — Patient Instructions (Signed)
[place] Discharge Instructions for Patients Receiving Chemotherapy  Today you received the following chemotherapy agents oxaliplatin, flurouricil, leucovorin.  To help prevent nausea and vomiting after your treatment, we encourage you to take your nausea medication as directed.   If you develop nausea and vomiting that is not controlled by your nausea medication, call the clinic.   BELOW ARE SYMPTOMS THAT SHOULD BE REPORTED IMMEDIATELY:  *FEVER GREATER THAN 100.5 F  *CHILLS WITH OR WITHOUT FEVER  NAUSEA AND VOMITING THAT IS NOT CONTROLLED WITH YOUR NAUSEA MEDICATION  *UNUSUAL SHORTNESS OF BREATH  *UNUSUAL BRUISING OR BLEEDING  TENDERNESS IN MOUTH AND THROAT WITH OR WITHOUT PRESENCE OF ULCERS  *URINARY PROBLEMS  *BOWEL PROBLEMS  UNUSUAL RASH Items with * indicate a potential emergency and should be followed up as soon as possible.  Feel free to call the clinic you have any questions or concerns. The clinic phone number is [phone].

## 2014-10-05 LAB — CEA: CEA: 75.9 ng/mL — ABNORMAL HIGH (ref 0.0–5.0)

## 2014-10-06 ENCOUNTER — Ambulatory Visit (HOSPITAL_BASED_OUTPATIENT_CLINIC_OR_DEPARTMENT_OTHER): Payer: Medicare Other

## 2014-10-06 VITALS — BP 138/67 | HR 72 | Temp 98.2°F | Resp 18

## 2014-10-06 DIAGNOSIS — E43 Unspecified severe protein-calorie malnutrition: Secondary | ICD-10-CM

## 2014-10-06 DIAGNOSIS — R059 Cough, unspecified: Secondary | ICD-10-CM

## 2014-10-06 DIAGNOSIS — C787 Secondary malignant neoplasm of liver and intrahepatic bile duct: Secondary | ICD-10-CM | POA: Diagnosis not present

## 2014-10-06 DIAGNOSIS — D649 Anemia, unspecified: Secondary | ICD-10-CM

## 2014-10-06 DIAGNOSIS — C801 Malignant (primary) neoplasm, unspecified: Secondary | ICD-10-CM

## 2014-10-06 DIAGNOSIS — E119 Type 2 diabetes mellitus without complications: Secondary | ICD-10-CM

## 2014-10-06 DIAGNOSIS — C171 Malignant neoplasm of jejunum: Secondary | ICD-10-CM

## 2014-10-06 DIAGNOSIS — N179 Acute kidney failure, unspecified: Secondary | ICD-10-CM

## 2014-10-06 DIAGNOSIS — K922 Gastrointestinal hemorrhage, unspecified: Secondary | ICD-10-CM

## 2014-10-06 DIAGNOSIS — C179 Malignant neoplasm of small intestine, unspecified: Secondary | ICD-10-CM

## 2014-10-06 DIAGNOSIS — R05 Cough: Secondary | ICD-10-CM

## 2014-10-06 DIAGNOSIS — R739 Hyperglycemia, unspecified: Secondary | ICD-10-CM

## 2014-10-06 DIAGNOSIS — R6510 Systemic inflammatory response syndrome (SIRS) of non-infectious origin without acute organ dysfunction: Secondary | ICD-10-CM

## 2014-10-06 MED ORDER — HEPARIN SOD (PORK) LOCK FLUSH 100 UNIT/ML IV SOLN
500.0000 [IU] | Freq: Once | INTRAVENOUS | Status: AC | PRN
  Administered 2014-10-06: 500 [IU]
  Filled 2014-10-06: qty 5

## 2014-10-06 MED ORDER — SODIUM CHLORIDE 0.9 % IJ SOLN
10.0000 mL | INTRAMUSCULAR | Status: DC | PRN
  Administered 2014-10-06: 10 mL
  Filled 2014-10-06: qty 10

## 2014-10-06 NOTE — Patient Instructions (Signed)
Fluorouracil, 5FU; Diclofenac topical cream What is this medicine? FLUOROURACIL; DICLOFENAC (flure oh YOOR a sil; dye KLOE fen ak) is a combination of a topical chemotherapy agent and non-steroidal anti-inflammatory drug (NSAID). It is used on the skin to treat skin cancer and skin conditions that could become cancer. This medicine may be used for other purposes; ask your health care provider or pharmacist if you have questions. COMMON BRAND NAME(S): FLUORAC What should I tell my health care provider before I take this medicine? They need to know if you have any of these conditions: -bleeding problems -cigarette smoker -DPD enzyme deficiency -heart disease -high blood pressure -if you frequently drink alcohol containing drinks -kidney disease -liver disease -open or infected skin -stomach problems -swelling or open sores at the treatment site -recent or planned coronary artery bypass graft (CABG) surgery -an unusual or allergic reaction to fluorouracil, diclofenac, aspirin, other NSAIDs, other medicines, foods, dyes, or preservatives -pregnant or trying to get pregnant -breast-feeding How should I use this medicine? This medicine is only for use on the skin. Follow the directions on the prescription label. Wash hands before and after use. Wash affected area and gently pat dry. To apply this medicine use a cotton-tipped applicator, or use gloves if applying with fingertips. If applied with unprotected fingertips, it is very important to wash your hands well after you apply this medicine. Avoid applying to the eyes, nose, or mouth. Apply enough medicine to cover the affected area. You can cover the area with a light gauze dressing, but do not use tight or air-tight dressings. Finish the full course prescribed by your doctor or health care professional, even if you think your condition is better. Do not stop taking except on the advice of your doctor or health care professional. Talk to your  pediatrician regarding the use of this medicine in children. Special care may be needed. Overdosage: If you think you've taken too much of this medicine contact a poison control center or emergency room at once. Overdosage: If you think you have taken too much of this medicine contact a poison control center or emergency room at once. NOTE: This medicine is only for you. Do not share this medicine with others. What if I miss a dose? If you miss a dose, apply it as soon as you can. If it is almost time for your next dose, only use that dose. Do not apply extra doses. Contact your doctor or health care professional if you miss more than one dose. What may interact with this medicine? Interactions are not expected. Do not use any other skin products without telling your doctor or health care professional. This list may not describe all possible interactions. Give your health care provider a list of all the medicines, herbs, non-prescription drugs, or dietary supplements you use. Also tell them if you smoke, drink alcohol, or use illegal drugs. Some items may interact with your medicine. What should I watch for while using this medicine? Visit your doctor or health care professional for checks on your progress. You will need to use this medicine for 2 to 6 weeks. This may be longer depending on the condition being treated. You may not see full healing for another 1 to 2 months after you stop using the medicine. Treated areas of skin can look unsightly during and for several weeks after treatment with this medicine. This medicine can make you more sensitive to the sun. Keep out of the sun. If you cannot avoid being in   the sun, wear protective clothing and use sunscreen. Do not use sun lamps or tanning beds/booths. Where should I keep my What side effects may I notice from receiving this medicine? Side effects that you should report to your doctor or health care professional as soon as possible: -allergic  reactions like skin rash, itching or hives, swelling of the face, lips, or tongue -black or bloody stools, blood in the urine or vomit -blurred vision -chest pain -difficulty breathing or wheezing -redness, blistering, peeling or loosening of the skin, including inside the mouth -severe redness and swelling of normal skin -slurred speech or weakness on one side of the body -trouble passing urine or change in the amount of urine -unexplained weight gain or swelling -unusually weak or tired -yellowing of eyes or skin Side effects that usually do not require medical attention (Report these to your doctor or health care professional if they continue or are bothersome.): -increased sensitivity of the skin to sun and ultraviolet light -pain and burning of the affected area -scaling or swelling of the affected area -skin rash, itching of the affected area -tenderness This list may not describe all possible side effects. Call your doctor for medical advice about side effects. You may report side effects to FDA at 1-800-FDA-1088. Where should I keep my medicine? Keep out of the reach of children. Store at room temperature between 20 and 25 degrees C (68 and 77 degrees F). Throw away any unused medicine after the expiration date. NOTE: This sheet is a summary. It may not cover all possible information. If you have questions about this medicine, talk to your doctor, pharmacist, or health care provider.  2015, Elsevier/Gold Standard. (2013-09-19 11:09:58)  

## 2014-10-17 ENCOUNTER — Telehealth: Payer: Self-pay | Admitting: Hematology

## 2014-10-17 ENCOUNTER — Ambulatory Visit (HOSPITAL_BASED_OUTPATIENT_CLINIC_OR_DEPARTMENT_OTHER): Payer: Medicare Other | Admitting: Hematology

## 2014-10-17 ENCOUNTER — Encounter: Payer: Self-pay | Admitting: Hematology

## 2014-10-17 ENCOUNTER — Other Ambulatory Visit (HOSPITAL_BASED_OUTPATIENT_CLINIC_OR_DEPARTMENT_OTHER): Payer: Medicare Other

## 2014-10-17 VITALS — BP 123/63 | HR 65 | Temp 97.8°F | Resp 18 | Ht 66.0 in | Wt 207.0 lb

## 2014-10-17 DIAGNOSIS — R634 Abnormal weight loss: Secondary | ICD-10-CM

## 2014-10-17 DIAGNOSIS — C171 Malignant neoplasm of jejunum: Secondary | ICD-10-CM

## 2014-10-17 DIAGNOSIS — E119 Type 2 diabetes mellitus without complications: Secondary | ICD-10-CM

## 2014-10-17 DIAGNOSIS — C179 Malignant neoplasm of small intestine, unspecified: Secondary | ICD-10-CM

## 2014-10-17 DIAGNOSIS — R630 Anorexia: Secondary | ICD-10-CM

## 2014-10-17 DIAGNOSIS — D500 Iron deficiency anemia secondary to blood loss (chronic): Secondary | ICD-10-CM

## 2014-10-17 DIAGNOSIS — N183 Chronic kidney disease, stage 3 (moderate): Secondary | ICD-10-CM | POA: Diagnosis not present

## 2014-10-17 DIAGNOSIS — C787 Secondary malignant neoplasm of liver and intrahepatic bile duct: Secondary | ICD-10-CM

## 2014-10-17 DIAGNOSIS — I10 Essential (primary) hypertension: Secondary | ICD-10-CM

## 2014-10-17 DIAGNOSIS — R599 Enlarged lymph nodes, unspecified: Secondary | ICD-10-CM | POA: Diagnosis not present

## 2014-10-17 LAB — COMPREHENSIVE METABOLIC PANEL (CC13)
ALBUMIN: 2.8 g/dL — AB (ref 3.5–5.0)
ALT: 23 U/L (ref 0–55)
ANION GAP: 9 meq/L (ref 3–11)
AST: 36 U/L — ABNORMAL HIGH (ref 5–34)
Alkaline Phosphatase: 88 U/L (ref 40–150)
BUN: 11.2 mg/dL (ref 7.0–26.0)
CO2: 26 mEq/L (ref 22–29)
Calcium: 9 mg/dL (ref 8.4–10.4)
Chloride: 107 mEq/L (ref 98–109)
Creatinine: 1 mg/dL (ref 0.7–1.3)
EGFR: 76 mL/min/{1.73_m2} — AB (ref >90)
GLUCOSE: 92 mg/dL (ref 70–140)
POTASSIUM: 4.1 meq/L (ref 3.5–5.1)
Sodium: 142 mEq/L (ref 136–145)
Total Bilirubin: 0.41 mg/dL (ref 0.20–1.20)
Total Protein: 6.5 g/dL (ref 6.4–8.3)

## 2014-10-17 LAB — CBC & DIFF AND RETIC
BASO%: 0.5 % (ref 0.0–2.0)
Basophils Absolute: 0 10*3/uL (ref 0.0–0.1)
EOS%: 1.6 % (ref 0.0–7.0)
Eosinophils Absolute: 0.1 10*3/uL (ref 0.0–0.5)
HEMATOCRIT: 32.6 % — AB (ref 38.4–49.9)
HGB: 10.2 g/dL — ABNORMAL LOW (ref 13.0–17.1)
IMMATURE RETIC FRACT: 15.3 % — AB (ref 3.00–10.60)
LYMPH#: 1.5 10*3/uL (ref 0.9–3.3)
LYMPH%: 44.9 % (ref 14.0–49.0)
MCH: 26.1 pg — ABNORMAL LOW (ref 27.2–33.4)
MCHC: 31.2 g/dL — AB (ref 32.0–36.0)
MCV: 83.6 fL (ref 79.3–98.0)
MONO#: 0.6 10*3/uL (ref 0.1–0.9)
MONO%: 16.5 % — ABNORMAL HIGH (ref 0.0–14.0)
NEUT#: 1.2 10*3/uL — ABNORMAL LOW (ref 1.5–6.5)
NEUT%: 36.5 % — ABNORMAL LOW (ref 39.0–75.0)
Platelets: 106 10*3/uL — ABNORMAL LOW (ref 140–400)
RBC: 3.9 10*6/uL — ABNORMAL LOW (ref 4.20–5.82)
RDW: 26.2 % — AB (ref 11.0–14.6)
RETIC %: 1.69 % (ref 0.80–1.80)
Retic Ct Abs: 65.91 10*3/uL (ref 34.80–93.90)
WBC: 3.4 10*3/uL — AB (ref 4.0–10.3)

## 2014-10-17 NOTE — Progress Notes (Signed)
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    08/18/2014 Miscellaneous tumor KRAS mutation (-)   08/18/2014 Pathology Results Liver biopsy showed metastatic adenocarcinoma, consistent with GI primary   08/18/2014 Initial Diagnosis Small bowel cancer   08/18/2014 Procedure small bowel enteroscopy with biopsy by Dr. Benson Evans showed at the proximal jejunum there was evidence of abnormal mucosa and friability, biopsy was obtained.    09/05/2014 Imaging PET hypermetabolic mass involving a small bowel loop with adjacent mesenteric lymphadenopathy, and diffuse liver metastasis and mild Pallor metabolic lymphadenopathy in porta hepatis.   09/06/2014 -  Chemotherapy mFOLFOX    09/07/2014 - 09/11/2014 Hospital Admission He was admitted for fever and GI bleeding. Received 3 units of RBC.     HISTORY OF PRESENTING ILLNESS:  Tyler Evans 69 y.o. male is here because of a recent diagnosis of small bowel cancer. He presented to his PCP on 08/17/2014 with complaints of feeling weak and a one month history of a cough. He was found to be anemic and was referred to the emergency department. Hemoglobin was found to be 7.6, MCV 76.6; creatinine was elevated at 1.74. Stool was Hemoccult positive.  CT abdomen/pelvis without contrast on 08/17/2014 showed multiple large masses within the liver, bulky in appearance. The largest measured approximately 5.7 cm. There appeared to be a focal filling defect within  the proximal jejunum measuring approximately 2.5 x 1.7 cm. There was mild adjacent jejunal wall thickening. The lung bases were clear.  On 08/18/2014 he underwent a small bowel enteroscopy with biopsy by Dr. Benson Evans. The esophagus and gastric lumen were normal. At the proximal jejunum there was evidence of abnormal mucosa and friability. The pediatric colonoscope was not able to traverse the area. Biopsies were obtained. An ultraslim colonoscope was then utilized. This colonoscope was able to traverse the area of stenosis which measured approximately 3 cm in length. 50% of the lumen was ulcerated. 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 MMR stains are pending.  He was discharged home on 08/19/2014.  INTERIM HISTORY:  Tyler Evans returns for follow-up before his fourth cycle chemotherapy. He is clinically doing very well. He denies any pain or discomfort, no nausea, bowel movement is regular in normal, no bleeding. He has good appetite and eating well. Mild fatigue is stable. No fever or chills. He functions well at home and tolerating daily activity without difficulty.  MEDICAL HISTORY:  Past Medical History  Diagnosis Date  . Diabetes mellitus without complication   . Hypertension   . Small bowel cancer 08/18/2014    SURGICAL HISTORY: Past Surgical History  Procedure Laterality Date  . Enteroscopy N/A 08/18/2014    Procedure: ENTEROSCOPY;  Surgeon: Evans Ada, MD;  Location: [facility];  Service: Endoscopy;  Laterality: N/A;    SOCIAL HISTORY: History   Social History  . Marital Status: Married    Spouse Name: N/A  . Number of Children: 2  . Years of  Education: N/A   Occupational History  .  retired Ambulance person    Social History Main Topics  . Smoking status: Never Smoker   . Smokeless tobacco: Not on file  . Alcohol Use: No  . Drug Use: Not on file  . Sexual Activity: Not on file   Other Topics Concern  . Not on file    Social History Narrative  He lives in [place]. He is married. He has 2 children both reported to be in good health. No tobacco or alcohol use. He is a retired Ambulance person.  FAMILY HISTORY: Family History  Problem Relation Age of Onset  . Heart failure, Alzheimer's  Mother   . Lung cancer Father   . Kidney cancer Brother     ALLERGIES:  is allergic to penicillins.  MEDICATIONS:  Current Outpatient Prescriptions  Medication Sig Dispense Refill  . atorvastatin (LIPITOR) 20 MG tablet Take 20 mg by mouth daily.    Marland Kitchen glipiZIDE (GLUCOTROL) 10 MG tablet Take 10 mg by mouth 2 (two) times daily before a meal.    . insulin glargine (LANTUS) 100 UNIT/ML injection Inject 40 Units into the skin at bedtime.    . pantoprazole (PROTONIX) 40 MG tablet Take 1 tablet (40 mg total) by mouth daily. 30 tablet 0  . chlorthalidone (HYGROTON) 25 MG tablet Take 25 mg by mouth daily.    Marland Kitchen lisinopril (PRINIVIL,ZESTRIL) 40 MG tablet Take 40 mg by mouth at bedtime.    . metFORMIN (GLUCOPHAGE) 1000 MG tablet Take 1,000 mg by mouth 2 (two) times daily with a meal.         REVIEW OF SYSTEMS:   Constitutional: Denies fevers, chills or abnormal night sweats; poor appetite. He estimates 20 pound weight loss over the past month Eyes: Denies blurriness of vision, double vision or watery eyes Ears, nose, mouth, throat, and face: Denies mucositis or sore throat Respiratory: One month history of a nonproductive cough;  no shortness of breath Cardiovascular: Denies palpitation, chest discomfort or lower extremity swelling Gastrointestinal:  Denies nausea, heartburn. No dysphagia. He has noted less frequent bowel movements over the past 2 weeks. Does not characterize this as constipation. Skin: Denies abnormal skin rashes Lymphatics: Denies new lymphadenopathy or easy bruising Neurological: Denies numbness, tingling. No extremity weakness Behavioral/Psych: Mood is stable, no new changes  All other systems were  reviewed with the patient and are negative.  PHYSICAL EXAMINATION: ECOG PERFORMANCE STATUS: 1 - Symptomatic but completely ambulatory  Filed Vitals:   10/17/14 1249  BP: 123/63  Pulse: 65  Temp: 97.8 F (36.6 C)  Resp: 18   Filed Weights   10/17/14 1249  Weight: 207 lb (93.895 kg)    GENERAL:alert, no distress and comfortable SKIN: skin color, texture, turgor are normal, no rashes or significant lesions EYES: normal, conjunctiva are pink and non-injected, sclera clear OROPHARYNX:no exudate, no erythema and lips, buccal mucosa, and tongue normal  NECK: supple, thyroid normal size, non-tender, without nodularity LYMPH:  no palpable lymphadenopathy in the cervical, axillary or inguinal regions LUNGS: clear to auscultation and percussion with normal breathing effort HEART: regular rate & rhythm and no murmurs and no lower extremity edema ABDOMEN:abdomen soft, mild tenderness at the right upper quadrant , no rebound pain, normal bowel sounds Musculoskeletal:no cyanosis of digits and no clubbing  PSYCH: alert & oriented x 3 with fluent speech NEURO: no focal motor/sensory deficits  LABORATORY DATA:  I have reviewed the data as listed CBC Latest Ref Rng 10/17/2014 10/03/2014 09/20/2014  WBC 4.0 - 10.3 10e3/uL 3.4(L) 5.0 6.3  Hemoglobin 13.0 - 17.1 g/dL 10.2(L) 10.9(L) 9.9(L)  Hematocrit 38.4 - 49.9 % 32.6(L) 34.8(L) 32.2(L)  Platelets 140 - 400 10e3/uL 106(L) 140 318    CMP Latest Ref Rng 10/03/2014 09/20/2014 09/13/2014  Glucose 70 - 140 mg/dl 109 267(H) 217(H)  BUN 7.0 - 26.0 mg/dL 10.7 13.3 16.5  Creatinine 0.7 - 1.3 mg/dL 1.1 1.1 1.2  Sodium 136 - 145 mEq/L 140 140 138  Potassium 3.5 - 5.1 mEq/L 4.0 4.2 4.0  Chloride 96 - 112 mmol/L - - -  CO2 22 - 29 mEq/L $Remove'26 24 24  'lHriEBz$ Calcium 8.4 - 10.4 mg/dL 8.9 8.4 8.0(L)  Total Protein 6.4 - 8.3 g/dL 6.7 6.6 6.2(L)  Total Bilirubin 0.20 - 1.20 mg/dL 0.44 0.31 0.36  Alkaline Phos 40 - 150 U/L 94 108 108  AST 5 - 34 U/L 27 29 34  ALT 0 - 55  U/L $Re'15 14 19    'roT$ Pathology report:  Small Intestine Biopsy, jejunal mass 08/18/2014 - INVASIVE ADENOCARCINOMA IN A BACKGROUND OF TUBULOVILLOUS ADENOMA. ADDITIONAL INFORMATION: Mismatch Repair (MMR) Protein Immunohistochemistry (IHC) IHC Expression Result (LIMITED TUMOR): MLH1: Preserved nuclear expression (greater 50% tumor expression) MSH2: Preserved nuclear expression (greater 50% tumor expression) MSH6: Preserved nuclear expression (greater 50% tumor expression) PMS2: Preserved nuclear expression (greater 50% tumor expression) * Internal control demonstrates intact nuclear expression Interpretation: NORMAL There is preserved expression  Diagnosis 08/28/2014  Liver, needle/core biopsy, right lobe - METASTATIC ADEN  RADIOGRAPHIC STUDIES: I have personally reviewed the radiological images as listed and agreed with the findings in the report.  Nm Pet Image Initial (pi) Skull Base To Thigh 09/05/2014    IMPRESSION: Hypermetabolic mass involving a small bowel loop with adjacent mesenteric lymphadenopathy in the left abdomen, consistent with known primary small bowel carcinoma. No evidence of small bowel obstruction.  Diffuse liver metastases. Mild hypermetabolic lymphadenopathy in porta hepatis, also suspicious for metastatic disease.  No evidence of metastatic disease within the neck, chest, or pelvis.  Incidental findings including cholelithiasis, diverticulosis, and mildly enlarged prostate.   Electronically Signed   By: Earle Gell M.D.   On: 09/05/2014 17:29    ASSESSMENT & PLAN:  69 year old gentleman with past medical history of diabetes, hypertension, who presents with anemia and weakness.  1.Small bowel cancer metastatic to the liver and abdominal nodes, MMR normal, KRAS/NRAS wild type -I reviewed his imaging findings, jejunum biopsy and liver biopsy results with patient and his wife extensively.  -Unfortunately he has stage IV disease with diffuse liver metastasis, this is an  incurable disease, with overall very poor prognosis. -I recommend systemic chemotherapy with FOLFOX, with a goal of palliation and prolonged his life. -Restaging after 4 cycles of chemotherapy -given the negative KRAS/NRAS GENE MUTATION, I would consider EGFR targeted therapy, either with first line (if restaging suboptimal response) or with second line chemo.  - Lab reviewed, mild cytopenia, adequate for treatment. We'll proceed cycle 4 tomorrow  -We discussed that he may need surgery or radiation if he has recurrent severe GI bleeding. He agrees. Hopefully the chemotherapy will control his cancer, and prevent further bleeding episodes.   2. Microcytic anemia secondary to GI bleeding and iron deficiency -His serum iron level and saturation were low, although ferritin is normal, he has some degree of iron deficient anemia, and anemia of malignancy  -Consider blood transfusion if hemoglobin less than 8 or symptomatic anemia -he received IV feraheme 510 mg twice in 09/2014 -he will  take OTC iron pill 1 tablet daily  -His hemoglobin is 10.2 today, improved after IV Feraheme.  3. anorexia/weight loss secondary to #1. -weight stable recently  -I encouraged him to take some nutritional supplement  4. Hypertension and DM  -He will continue follow-up with his primary care physician. -We discussed that we will monitor his blood pressure and blood sugar closely and may need to adjust his medication during the chemotherapy   5. CKD, stage III -He is currently stable at 1.1, improved overall  -I encouraged him to drink adequately and avoid dehydration. We discussed he may have more side effects from chemotherapy due to his kidney problem. -I'll adjust his chemotherapy dose as needed based on his kidney function.   Plan: -FOLFOX cycle 4 tomorrow, no 5FU bolus. Granix on day 3 with this cycle  -restaging PET before next cycle chemo  -I will see him after his PET scan or before his next cycle chemo      Truitt Merle  10/17/2014

## 2014-10-17 NOTE — Telephone Encounter (Signed)
Gave and printed appt sched and avs for pt for May and June  °

## 2014-10-18 ENCOUNTER — Ambulatory Visit: Payer: Medicare Other | Admitting: Nutrition

## 2014-10-18 ENCOUNTER — Other Ambulatory Visit: Payer: Medicare Other

## 2014-10-18 ENCOUNTER — Encounter: Payer: Self-pay | Admitting: *Deleted

## 2014-10-18 ENCOUNTER — Ambulatory Visit (HOSPITAL_BASED_OUTPATIENT_CLINIC_OR_DEPARTMENT_OTHER): Payer: Medicare Other

## 2014-10-18 ENCOUNTER — Ambulatory Visit: Payer: Medicare Other | Admitting: Hematology

## 2014-10-18 VITALS — BP 141/62 | HR 65 | Temp 97.9°F

## 2014-10-18 DIAGNOSIS — C171 Malignant neoplasm of jejunum: Secondary | ICD-10-CM

## 2014-10-18 DIAGNOSIS — C787 Secondary malignant neoplasm of liver and intrahepatic bile duct: Secondary | ICD-10-CM | POA: Diagnosis not present

## 2014-10-18 DIAGNOSIS — Z5111 Encounter for antineoplastic chemotherapy: Secondary | ICD-10-CM | POA: Diagnosis not present

## 2014-10-18 DIAGNOSIS — C179 Malignant neoplasm of small intestine, unspecified: Secondary | ICD-10-CM

## 2014-10-18 MED ORDER — DEXTROSE 5 % IV SOLN
85.0000 mg/m2 | Freq: Once | INTRAVENOUS | Status: AC
  Administered 2014-10-18: 180 mg via INTRAVENOUS
  Filled 2014-10-18: qty 36

## 2014-10-18 MED ORDER — LEUCOVORIN CALCIUM INJECTION 350 MG
403.0000 mg/m2 | Freq: Once | INTRAMUSCULAR | Status: AC
  Administered 2014-10-18: 850 mg via INTRAVENOUS
  Filled 2014-10-18: qty 42.5

## 2014-10-18 MED ORDER — DEXTROSE 5 % IV SOLN
Freq: Once | INTRAVENOUS | Status: AC
  Administered 2014-10-18: 10:00:00 via INTRAVENOUS

## 2014-10-18 MED ORDER — SODIUM CHLORIDE 0.9 % IV SOLN
Freq: Once | INTRAVENOUS | Status: AC
  Administered 2014-10-18: 10:00:00 via INTRAVENOUS
  Filled 2014-10-18: qty 4

## 2014-10-18 MED ORDER — SODIUM CHLORIDE 0.9 % IV SOLN
2380.0000 mg/m2 | INTRAVENOUS | Status: DC
  Administered 2014-10-18: 5000 mg via INTRAVENOUS
  Filled 2014-10-18: qty 100

## 2014-10-18 NOTE — Progress Notes (Signed)
Nutrition follow-up completed with patient in chemotherapy for cancer of the small bowel with metastasis to the liver. Weight has stabilized and was documented as 207 pounds May 17. Patient reports he feels well. He is eating 3 meals and snacking. He drinks ensure high-protein Patient denies nutrition impact symptoms.  Nutrition diagnosis: Unintended weight loss has improved.  Intervention: Patient educated to continue same strategy of high-calorie, high-protein foods with snacks in between. Patient and continue ensure high-protein once daily. Encouraged patient to maintain her weight. Teach back method used.  Monitoring, evaluation, goals: Patient will continue adequate calories and protein for weight maintenance.  Next visit: Wednesday, June 1 during chemotherapy.  **Disclaimer: This note was dictated with voice recognition software. Similar sounding words can inadvertently be transcribed and this note may contain transcription errors which may not have been corrected upon publication of note.**

## 2014-10-18 NOTE — Patient Instructions (Signed)
[facility] Discharge Instructions for Patients Receiving Chemotherapy  Today you received the following chemotherapy agents oxaliplatin/leucovorin/5FU  To help prevent nausea and vomiting after your treatment, we encourage you to take your nausea medication as directed   If you develop nausea and vomiting that is not controlled by your nausea medication, call the clinic.   BELOW ARE SYMPTOMS THAT SHOULD BE REPORTED IMMEDIATELY:  *FEVER GREATER THAN 100.5 F  *CHILLS WITH OR WITHOUT FEVER  NAUSEA AND VOMITING THAT IS NOT CONTROLLED WITH YOUR NAUSEA MEDICATION  *UNUSUAL SHORTNESS OF BREATH  *UNUSUAL BRUISING OR BLEEDING  TENDERNESS IN MOUTH AND THROAT WITH OR WITHOUT PRESENCE OF ULCERS  *URINARY PROBLEMS  *BOWEL PROBLEMS  UNUSUAL RASH Items with * indicate a potential emergency and should be followed up as soon as possible.  Feel free to call the clinic you have any questions or concerns. The clinic phone number is [phone].

## 2014-10-18 NOTE — Progress Notes (Signed)
Per progress note, ok to treat.  Pt to get granix.

## 2014-10-18 NOTE — [facility] Oncology Navigator Note (Signed)
Oncology Nurse Navigator Documentation  Oncology Nurse Navigator Flowsheets 10/18/2014  Navigator Encounter Type Treatment  Patient Visit Type Medonc  Treatment Phase Treatment # 4  Barriers/Navigation Needs No barriers at this time  Interventions None required  Support Groups/Services Provided with flyer about survivor day  Time Spent with Patient 54   Tyler Evans feels treatment is going well and has no adverse effects or questions for nurse. Encouraged him to come to support group and to bring his family to the Survivor Day event.

## 2014-10-20 ENCOUNTER — Ambulatory Visit (HOSPITAL_BASED_OUTPATIENT_CLINIC_OR_DEPARTMENT_OTHER): Payer: Medicare Other

## 2014-10-20 ENCOUNTER — Ambulatory Visit: Payer: Medicare Other

## 2014-10-20 VITALS — BP 138/55 | HR 61 | Temp 98.3°F | Resp 18

## 2014-10-20 DIAGNOSIS — Z5189 Encounter for other specified aftercare: Secondary | ICD-10-CM | POA: Diagnosis not present

## 2014-10-20 DIAGNOSIS — C787 Secondary malignant neoplasm of liver and intrahepatic bile duct: Secondary | ICD-10-CM | POA: Diagnosis not present

## 2014-10-20 DIAGNOSIS — C179 Malignant neoplasm of small intestine, unspecified: Secondary | ICD-10-CM

## 2014-10-20 DIAGNOSIS — C171 Malignant neoplasm of jejunum: Secondary | ICD-10-CM

## 2014-10-20 MED ORDER — TBO-FILGRASTIM 480 MCG/0.8ML ~~LOC~~ SOSY
480.0000 ug | PREFILLED_SYRINGE | Freq: Once | SUBCUTANEOUS | Status: AC
Start: 2014-10-20 — End: 2014-10-20
  Administered 2014-10-20: 480 ug via SUBCUTANEOUS
  Filled 2014-10-20: qty 0.8

## 2014-10-20 MED ORDER — SODIUM CHLORIDE 0.9 % IJ SOLN
10.0000 mL | INTRAMUSCULAR | Status: DC | PRN
  Administered 2014-10-20: 10 mL
  Filled 2014-10-20: qty 10

## 2014-10-20 MED ORDER — HEPARIN SOD (PORK) LOCK FLUSH 100 UNIT/ML IV SOLN
500.0000 [IU] | Freq: Once | INTRAVENOUS | Status: AC | PRN
  Administered 2014-10-20: 500 [IU]
  Filled 2014-10-20: qty 5

## 2014-10-20 NOTE — Patient Instructions (Signed)

## 2014-10-20 NOTE — Progress Notes (Signed)
Neulasta injection given by flush nurse 

## 2014-10-26 ENCOUNTER — Ambulatory Visit (HOSPITAL_COMMUNITY)
Admission: RE | Admit: 2014-10-26 | Discharge: 2014-10-26 | Disposition: A | Discharge status: Home / Self Care | Payer: Medicare Other | Source: Ambulatory Visit | Attending: Hematology | Admitting: Hematology

## 2014-10-26 DIAGNOSIS — C179 Malignant neoplasm of small intestine, unspecified: Secondary | ICD-10-CM | POA: Diagnosis present

## 2014-10-26 LAB — GLUCOSE, CAPILLARY: Glucose-Capillary: 194 mg/dL — ABNORMAL HIGH (ref 65–99)

## 2014-10-26 MED ORDER — FLUDEOXYGLUCOSE F - 18 (FDG) INJECTION
9.9100 | Freq: Once | INTRAVENOUS | Status: AC | PRN
  Administered 2014-10-26: 9.91 via INTRAVENOUS

## 2014-10-31 ENCOUNTER — Ambulatory Visit (HOSPITAL_BASED_OUTPATIENT_CLINIC_OR_DEPARTMENT_OTHER): Payer: Medicare Other | Admitting: Hematology

## 2014-10-31 ENCOUNTER — Encounter: Payer: Self-pay | Admitting: Hematology

## 2014-10-31 ENCOUNTER — Telehealth: Payer: Self-pay | Admitting: Hematology

## 2014-10-31 ENCOUNTER — Other Ambulatory Visit (HOSPITAL_BASED_OUTPATIENT_CLINIC_OR_DEPARTMENT_OTHER): Payer: Medicare Other

## 2014-10-31 ENCOUNTER — Telehealth: Payer: Self-pay | Admitting: *Deleted

## 2014-10-31 VITALS — BP 133/72 | HR 75 | Temp 98.2°F | Resp 18 | Ht 66.0 in | Wt 205.9 lb

## 2014-10-31 DIAGNOSIS — N183 Chronic kidney disease, stage 3 (moderate): Secondary | ICD-10-CM

## 2014-10-31 DIAGNOSIS — C772 Secondary and unspecified malignant neoplasm of intra-abdominal lymph nodes: Secondary | ICD-10-CM | POA: Diagnosis not present

## 2014-10-31 DIAGNOSIS — E611 Iron deficiency: Secondary | ICD-10-CM

## 2014-10-31 DIAGNOSIS — C787 Secondary malignant neoplasm of liver and intrahepatic bile duct: Secondary | ICD-10-CM

## 2014-10-31 DIAGNOSIS — C179 Malignant neoplasm of small intestine, unspecified: Secondary | ICD-10-CM

## 2014-10-31 DIAGNOSIS — R630 Anorexia: Secondary | ICD-10-CM

## 2014-10-31 DIAGNOSIS — R634 Abnormal weight loss: Secondary | ICD-10-CM

## 2014-10-31 DIAGNOSIS — D500 Iron deficiency anemia secondary to blood loss (chronic): Secondary | ICD-10-CM

## 2014-10-31 LAB — CBC & DIFF AND RETIC
BASO%: 0.4 % (ref 0.0–2.0)
Basophils Absolute: 0 10*3/uL (ref 0.0–0.1)
EOS%: 1.7 % (ref 0.0–7.0)
Eosinophils Absolute: 0 10*3/uL (ref 0.0–0.5)
HCT: 33.6 % — ABNORMAL LOW (ref 38.4–49.9)
HEMOGLOBIN: 10.6 g/dL — AB (ref 13.0–17.1)
Immature Retic Fract: 24.6 % — ABNORMAL HIGH (ref 3.00–10.60)
LYMPH%: 50.7 % — ABNORMAL HIGH (ref 14.0–49.0)
MCH: 27.1 pg — ABNORMAL LOW (ref 27.2–33.4)
MCHC: 31.5 g/dL — ABNORMAL LOW (ref 32.0–36.0)
MCV: 85.9 fL (ref 79.3–98.0)
MONO#: 0.5 10*3/uL (ref 0.1–0.9)
MONO%: 21 % — ABNORMAL HIGH (ref 0.0–14.0)
NEUT%: 26.2 % — AB (ref 39.0–75.0)
NEUTROS ABS: 0.6 10*3/uL — AB (ref 1.5–6.5)
NRBC: 0 % (ref 0–0)
Platelets: 77 10*3/uL — ABNORMAL LOW (ref 140–400)
RBC: 3.91 10*6/uL — ABNORMAL LOW (ref 4.20–5.82)
RDW: 25.6 % — AB (ref 11.0–14.6)
RETIC CT ABS: 96.58 10*3/uL — AB (ref 34.80–93.90)
Retic %: 2.47 % — ABNORMAL HIGH (ref 0.80–1.80)
WBC: 2.3 10*3/uL — ABNORMAL LOW (ref 4.0–10.3)
lymph#: 1.2 10*3/uL (ref 0.9–3.3)

## 2014-10-31 LAB — COMPREHENSIVE METABOLIC PANEL (CC13)
ALBUMIN: 2.8 g/dL — AB (ref 3.5–5.0)
ALK PHOS: 95 U/L (ref 40–150)
ALT: 31 U/L (ref 0–55)
AST: 41 U/L — ABNORMAL HIGH (ref 5–34)
Anion Gap: 11 mEq/L (ref 3–11)
BUN: 15.8 mg/dL (ref 7.0–26.0)
CHLORIDE: 107 meq/L (ref 98–109)
CO2: 23 mEq/L (ref 22–29)
Calcium: 8.7 mg/dL (ref 8.4–10.4)
Creatinine: 1.1 mg/dL (ref 0.7–1.3)
EGFR: 69 mL/min/{1.73_m2} — ABNORMAL LOW (ref >90)
Glucose: 145 mg/dl — ABNORMAL HIGH (ref 70–140)
Potassium: 3.9 mEq/L (ref 3.5–5.1)
SODIUM: 141 meq/L (ref 136–145)
TOTAL PROTEIN: 6.8 g/dL (ref 6.4–8.3)
Total Bilirubin: 0.41 mg/dL (ref 0.20–1.20)

## 2014-10-31 NOTE — Telephone Encounter (Signed)
Per staff message and POF I have scheduled appts. Advised scheduler of appts. JMW  

## 2014-10-31 NOTE — Progress Notes (Signed)
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    08/18/2014 Miscellaneous tumor KRAS mutation (-)   08/18/2014 Pathology Results Liver biopsy showed metastatic adenocarcinoma, consistent with GI primary   08/18/2014 Initial Diagnosis Small bowel cancer   08/18/2014 Procedure small bowel enteroscopy with biopsy by Dr. Elnoria Howard showed at the proximal jejunum there was evidence of abnormal mucosa and friability, biopsy was obtained.    09/05/2014 Imaging PET hypermetabolic mass involving a small bowel loop with adjacent mesenteric lymphadenopathy, and diffuse liver metastasis and mild Pallor metabolic lymphadenopathy in porta hepatis.   09/06/2014 -  Chemotherapy mFOLFOX    09/07/2014 - 09/11/2014 Hospital Admission He was admitted for fever and GI bleeding. Received 3 units of RBC.   10/26/2014 Imaging Interval significant improvement in the primary small bowel mass, adjacent lymphadenopathy and extensive hepatic metastatic disease. No other new lesions.      HISTORY OF PRESENTING ILLNESS:  Tyler Evans 70 y.o. male is here because of a recent diagnosis of small bowel cancer. He presented to his PCP on 08/17/2014 with complaints of feeling weak and a one month history of a cough. He was found to be anemic and was referred to the emergency department. Hemoglobin was found to be 7.6, MCV 76.6; creatinine was elevated at 1.74. Stool was Hemoccult positive.  CT abdomen/pelvis without contrast  on 08/17/2014 showed multiple large masses within the liver, bulky in appearance. The largest measured approximately 5.7 cm. There appeared to be a focal filling defect within the proximal jejunum measuring approximately 2.5 x 1.7 cm. There was mild adjacent jejunal wall thickening. The lung bases were clear.  On 08/18/2014 he underwent a small bowel enteroscopy with biopsy by Dr. Elnoria Howard. The esophagus and gastric lumen were normal. At the proximal jejunum there was evidence of abnormal mucosa and friability. The pediatric colonoscope was not able to traverse the area. Biopsies were obtained. An ultraslim colonoscope was then utilized. This colonoscope was able to traverse the area of stenosis which measured approximately 3 cm in length. 50% of the lumen was ulcerated. 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 MMR stains are pending.  He was discharged home on 08/19/2014.  INTERIM HISTORY:  Tyler Evans returns for follow-up and discuss restaging PET scan results. He is doing very well. He has been tolerating chemotherapy very well without significant side effects. He has mild fatigue, good appetite, eats well. He denies any pain, fever, chills, bleeding, or any as new symptoms.   MEDICAL HISTORY:  Past Medical History  Diagnosis Date  . Diabetes mellitus without complication   . Hypertension   . Small bowel cancer 08/18/2014    SURGICAL HISTORY: Past Surgical History  Procedure Laterality Date  . Enteroscopy N/A 08/18/2014    Procedure: ENTEROSCOPY;  Surgeon: Jeani Hawking, MD;  Location: [facility];  Service: Endoscopy;  Laterality: N/A;    SOCIAL HISTORY: History   Social History  . Marital Status: Married  Spouse Name: N/A  . Number of Children: 2  . Years of Education: N/A   Occupational History  .  retired Ambulance person    Social History Main Topics  . Smoking status: Never Smoker   . Smokeless tobacco: Not on file  . Alcohol Use:  No  . Drug Use: Not on file  . Sexual Activity: Not on file   Other Topics Concern  . Not on file   Social History Narrative  He lives in [place]. He is married. He has 2 children both reported to be in good health. No tobacco or alcohol use. He is a retired Ambulance person.  FAMILY HISTORY: Family History  Problem Relation Age of Onset  . Heart failure, Alzheimer's  Mother   . Lung cancer Father   . Kidney cancer Brother     ALLERGIES:  is allergic to penicillins.  MEDICATIONS:  Current Outpatient Prescriptions  Medication Sig Dispense Refill  . atorvastatin (LIPITOR) 20 MG tablet Take 20 mg by mouth daily.    Marland Kitchen glipiZIDE (GLUCOTROL) 10 MG tablet Take 10 mg by mouth 2 (two) times daily before a meal.    . insulin glargine (LANTUS) 100 UNIT/ML injection Inject 40 Units into the skin at bedtime.    . pantoprazole (PROTONIX) 40 MG tablet Take 1 tablet (40 mg total) by mouth daily. 30 tablet 0  . chlorthalidone (HYGROTON) 25 MG tablet Take 25 mg by mouth daily.    Marland Kitchen lisinopril (PRINIVIL,ZESTRIL) 40 MG tablet Take 40 mg by mouth at bedtime.    . metFORMIN (GLUCOPHAGE) 1000 MG tablet Take 1,000 mg by mouth 2 (two) times daily with a meal.         REVIEW OF SYSTEMS:   Constitutional: Denies fevers, chills or abnormal night sweats; poor appetite. He estimates 20 pound weight loss over the past month Eyes: Denies blurriness of vision, double vision or watery eyes Ears, nose, mouth, throat, and face: Denies mucositis or sore throat Respiratory: One month history of a nonproductive cough;  no shortness of breath Cardiovascular: Denies palpitation, chest discomfort or lower extremity swelling Gastrointestinal:  Denies nausea, heartburn. No dysphagia. He has noted less frequent bowel movements over the past 2 weeks. Does not characterize this as constipation. Skin: Denies abnormal skin rashes Lymphatics: Denies new lymphadenopathy or easy bruising Neurological: Denies numbness,  tingling. No extremity weakness Behavioral/Psych: Mood is stable, no new changes  All other systems were reviewed with the patient and are negative.  PHYSICAL EXAMINATION: ECOG PERFORMANCE STATUS: 1 - Symptomatic but completely ambulatory  Filed Vitals:   10/31/14 1240  BP: 133/72  Pulse: 75  Temp: 98.2 F (36.8 C)  Resp: 18   Filed Weights   10/31/14 1240  Weight: 205 lb 14.4 oz (93.396 kg)    GENERAL:alert, no distress and comfortable SKIN: skin color, texture, turgor are normal, no rashes or significant lesions EYES: normal, conjunctiva are pink and non-injected, sclera clear OROPHARYNX:no exudate, no erythema and lips, buccal mucosa, and tongue normal  NECK: supple, thyroid normal size, non-tender, without nodularity LYMPH:  no palpable lymphadenopathy in the cervical, axillary or inguinal regions LUNGS: clear to auscultation and percussion with normal breathing effort HEART: regular rate & rhythm and no murmurs and no lower extremity edema ABDOMEN:abdomen soft, mild tenderness at the right upper quadrant , no rebound pain, normal bowel sounds Musculoskeletal:no cyanosis of digits and no clubbing  PSYCH: alert & oriented x 3 with fluent speech NEURO: no focal motor/sensory deficits  LABORATORY DATA:  I have reviewed the data as listed CBC Latest Ref Rng 10/31/2014 10/17/2014 10/03/2014  WBC 4.0 - 10.3 10e3/uL 2.3(L) 3.4(L) 5.0  Hemoglobin 13.0 - 17.1 g/dL 10.6(L) 10.2(L) 10.9(L)  Hematocrit 38.4 - 49.9 % 33.6(L) 32.6(L) 34.8(L)  Platelets 140 - 400 10e3/uL 77(L) 106(L) 140    CMP Latest Ref Rng 10/31/2014 10/17/2014 10/03/2014  Glucose 70 - 140 mg/dl 145(H) 92 109  BUN 7.0 - 26.0 mg/dL 15.8 11.2 10.7  Creatinine 0.7 - 1.3 mg/dL 1.1 1.0 1.1  Sodium 136 - 145 mEq/L 141 142 140  Potassium 3.5 - 5.1 mEq/L 3.9 4.1 4.0  Chloride 96 - 112 mmol/L - - -  CO2 22 - 29 mEq/L $Remove'23 26 26  'EAFitGg$ Calcium 8.4 - 10.4 mg/dL 8.7 9.0 8.9  Total Protein 6.4 - 8.3 g/dL 6.8 6.5 6.7  Total Bilirubin  0.20 - 1.20 mg/dL 0.41 0.41 0.44  Alkaline Phos 40 - 150 U/L 95 88 94  AST 5 - 34 U/L 41(H) 36(H) 27  ALT 0 - 55 U/L $Remo'31 23 15    'QMpGc$ Pathology report:  Small Intestine Biopsy, jejunal mass 08/18/2014 - INVASIVE ADENOCARCINOMA IN A BACKGROUND OF TUBULOVILLOUS ADENOMA. ADDITIONAL INFORMATION: Mismatch Repair (MMR) Protein Immunohistochemistry (IHC) IHC Expression Result (LIMITED TUMOR): MLH1: Preserved nuclear expression (greater 50% tumor expression) MSH2: Preserved nuclear expression (greater 50% tumor expression) MSH6: Preserved nuclear expression (greater 50% tumor expression) PMS2: Preserved nuclear expression (greater 50% tumor expression) * Internal control demonstrates intact nuclear expression Interpretation: NORMAL There is preserved expression  Diagnosis 08/28/2014  Liver, needle/core biopsy, right lobe - METASTATIC ADEN  RADIOGRAPHIC STUDIES: I have personally reviewed the radiological images as listed and agreed with the findings in the report.  Nm Pet Image Initial (pi) Skull Base To Thigh 09/05/2014    IMPRESSION: Hypermetabolic mass involving a small bowel loop with adjacent mesenteric lymphadenopathy in the left abdomen, consistent with known primary small bowel carcinoma. No evidence of small bowel obstruction.  Diffuse liver metastases. Mild hypermetabolic lymphadenopathy in porta hepatis, also suspicious for metastatic disease.  No evidence of metastatic disease within the neck, chest, or pelvis.  Incidental findings including cholelithiasis, diverticulosis, and mildly enlarged prostate.   Electronically Signed   By: Earle Gell M.D.   On: 09/05/2014 17:29   PET 10/26/2014  IMPRESSION: 1. Interval significant improvement in the primary small bowel mass, adjacent lymphadenopathy and extensive hepatic metastatic disease. 2. No disease progression identified. 3. No significant findings in the neck or chest. Small pulmonary nodules bilaterally are unchanged.   ASSESSMENT  & PLAN:  69 year old gentleman with past medical history of diabetes, hypertension, who presents with anemia and weakness.  1.Small bowel cancer metastatic to the liver and abdominal nodes, MMR normal, KRAS/NRAS wild type -I reviewed his imaging findings, jejunum biopsy and liver biopsy results with patient and his wife extensively.  -Unfortunately he has stage IV disease with diffuse liver metastasis, this is an incurable disease, with overall very poor prognosis. -I recommend systemic chemotherapy with FOLFOX, with a goal of palliation and prolonged his life. -I discussed his restaging PET scan results with patient and his wife. He has had grade partial response, with significant interval improvement in the primary small bowel mass and liver metastasis. No other new lesions. -He is tolerating FOLFOX very well, we'll continue -given the negative KRAS/NRAS GENE MUTATION, I would consider EGFR targeted therapy, with second line chemo.  - Lab reviewed, he is still quite neutropenic with ANC 0.6 today, somewhat cytopenic with platelet count 77k  today. We'll postpone his chemotherapy 2 next week. -We discussed that he may need surgery or radiation if he has recurrent severe GI bleeding. He agrees. Hopefully the chemotherapy will control his cancer, and prevent further bleeding episodes.   2. Microcytic anemia secondary to GI bleeding and iron deficiency -His serum iron level and saturation were low, although ferritin is normal, he has some degree of iron deficient anemia, and anemia of malignancy  -Consider blood transfusion if hemoglobin less than 8 or symptomatic anemia -he received IV feraheme 510 mg twice in 09/2014 -Continue OTC iron pill 1 tablet daily  -His hemoglobin is 10.6, stable .  3. anorexia/weight loss secondary to #1. -weight stable recently  -I encouraged him to take some nutritional supplement  4. Hypertension and DM  -He will continue follow-up with his primary care  physician. -We discussed that we will monitor his blood pressure and blood sugar closely and may need to adjust his medication during the chemotherapy   5. CKD, stage III -He is currently stable at 1.1, improved overall  -I encouraged him to drink adequately and avoid dehydration. We discussed he may have more side effects from chemotherapy due to his kidney problem. -I'll adjust his chemotherapy dose as needed based on his kidney function.   Plan: -postpone chemo from tomorrow to next Wednesday  -He will see APP in 3 weeks and me in 5 weeks   Truitt Merle  10/31/2014

## 2014-10-31 NOTE — Telephone Encounter (Signed)
Pt confirmed labs/ov per 05/31 POF, gave pt AVS and Calendar..... KJ, sent msg to r/s chemo from 06/01 to 06/08 and move up other chemo dates

## 2014-11-01 ENCOUNTER — Ambulatory Visit: Payer: Medicare Other

## 2014-11-01 ENCOUNTER — Encounter: Payer: Medicare Other | Admitting: Nutrition

## 2014-11-01 LAB — CEA: CEA: 30.4 ng/mL — ABNORMAL HIGH (ref 0.0–5.0)

## 2014-11-08 ENCOUNTER — Ambulatory Visit (HOSPITAL_BASED_OUTPATIENT_CLINIC_OR_DEPARTMENT_OTHER): Payer: Medicare Other

## 2014-11-08 ENCOUNTER — Ambulatory Visit: Payer: Medicare Other | Admitting: Nutrition

## 2014-11-08 VITALS — BP 130/53 | HR 64 | Temp 97.3°F | Resp 16

## 2014-11-08 DIAGNOSIS — C787 Secondary malignant neoplasm of liver and intrahepatic bile duct: Secondary | ICD-10-CM

## 2014-11-08 DIAGNOSIS — C772 Secondary and unspecified malignant neoplasm of intra-abdominal lymph nodes: Secondary | ICD-10-CM

## 2014-11-08 DIAGNOSIS — Z5111 Encounter for antineoplastic chemotherapy: Secondary | ICD-10-CM

## 2014-11-08 DIAGNOSIS — C179 Malignant neoplasm of small intestine, unspecified: Secondary | ICD-10-CM

## 2014-11-08 LAB — COMPREHENSIVE METABOLIC PANEL (CC13)
ALK PHOS: 90 U/L (ref 40–150)
ALT: 16 U/L (ref 0–55)
AST: 32 U/L (ref 5–34)
Albumin: 2.4 g/dL — ABNORMAL LOW (ref 3.5–5.0)
Anion Gap: 6 mEq/L (ref 3–11)
BUN: 11.9 mg/dL (ref 7.0–26.0)
CO2: 26 mEq/L (ref 22–29)
Calcium: 8.5 mg/dL (ref 8.4–10.4)
Chloride: 110 mEq/L — ABNORMAL HIGH (ref 98–109)
Creatinine: 0.9 mg/dL (ref 0.7–1.3)
EGFR: 84 mL/min/{1.73_m2} — AB (ref >90)
Glucose: 136 mg/dl (ref 70–140)
Potassium: 3.5 mEq/L (ref 3.5–5.1)
SODIUM: 142 meq/L (ref 136–145)
Total Bilirubin: 0.26 mg/dL (ref 0.20–1.20)
Total Protein: 6 g/dL — ABNORMAL LOW (ref 6.4–8.3)

## 2014-11-08 LAB — CEA: CEA: 19.1 ng/mL — ABNORMAL HIGH (ref 0.0–5.0)

## 2014-11-08 LAB — CBC & DIFF AND RETIC
BASO%: 0.6 % (ref 0.0–2.0)
Basophils Absolute: 0 10*3/uL (ref 0.0–0.1)
EOS ABS: 0.1 10*3/uL (ref 0.0–0.5)
EOS%: 1.5 % (ref 0.0–7.0)
HCT: 32.3 % — ABNORMAL LOW (ref 38.4–49.9)
HEMOGLOBIN: 10 g/dL — AB (ref 13.0–17.1)
Immature Retic Fract: 27.2 % — ABNORMAL HIGH (ref 3.00–10.60)
LYMPH%: 31.4 % (ref 14.0–49.0)
MCH: 27.8 pg (ref 27.2–33.4)
MCHC: 31 g/dL — ABNORMAL LOW (ref 32.0–36.0)
MCV: 89.7 fL (ref 79.3–98.0)
MONO#: 1.2 10*3/uL — AB (ref 0.1–0.9)
MONO%: 25.2 % — ABNORMAL HIGH (ref 0.0–14.0)
NEUT#: 1.9 10*3/uL (ref 1.5–6.5)
NEUT%: 41.3 % (ref 39.0–75.0)
Platelets: 199 10*3/uL (ref 140–400)
RBC: 3.6 10*6/uL — AB (ref 4.20–5.82)
RDW: 26.1 % — ABNORMAL HIGH (ref 11.0–14.6)
RETIC %: 2.19 % — AB (ref 0.80–1.80)
Retic Ct Abs: 78.84 10*3/uL (ref 34.80–93.90)
WBC: 4.7 10*3/uL (ref 4.0–10.3)
lymph#: 1.5 10*3/uL (ref 0.9–3.3)

## 2014-11-08 MED ORDER — DEXTROSE 5 % IV SOLN
Freq: Once | INTRAVENOUS | Status: AC
  Administered 2014-11-08: 09:00:00 via INTRAVENOUS

## 2014-11-08 MED ORDER — SODIUM CHLORIDE 0.9 % IV SOLN
2375.0000 mg/m2 | INTRAVENOUS | Status: DC
  Administered 2014-11-08: 5000 mg via INTRAVENOUS
  Filled 2014-11-08: qty 100

## 2014-11-08 MED ORDER — LEUCOVORIN CALCIUM INJECTION 350 MG
403.0000 mg/m2 | Freq: Once | INTRAVENOUS | Status: AC
  Administered 2014-11-08: 850 mg via INTRAVENOUS
  Filled 2014-11-08: qty 42.5

## 2014-11-08 MED ORDER — SODIUM CHLORIDE 0.9 % IJ SOLN
10.0000 mL | INTRAMUSCULAR | Status: DC | PRN
  Filled 2014-11-08: qty 10

## 2014-11-08 MED ORDER — HEPARIN SOD (PORK) LOCK FLUSH 100 UNIT/ML IV SOLN
500.0000 [IU] | Freq: Once | INTRAVENOUS | Status: DC | PRN
  Filled 2014-11-08: qty 5

## 2014-11-08 MED ORDER — DEXTROSE 5 % IV SOLN
85.0000 mg/m2 | Freq: Once | INTRAVENOUS | Status: AC
  Administered 2014-11-08: 180 mg via INTRAVENOUS
  Filled 2014-11-08: qty 36

## 2014-11-08 MED ORDER — SODIUM CHLORIDE 0.9 % IV SOLN
Freq: Once | INTRAVENOUS | Status: AC
  Administered 2014-11-08: 10:00:00 via INTRAVENOUS
  Filled 2014-11-08: qty 4

## 2014-11-08 NOTE — Progress Notes (Signed)
Nutrition follow-up completed with patient during infusion.  Weight is stable and was documented as 205.9 pounds on May 31. Patient denies nutrition impact symptoms. Patient reports eating well with occasional snacking.  He will continue to drink an ensure high-protein, on occasion.  Nutrition diagnosis: Unintended weight loss resolved.  Encouraged patient to continue strategies for adequate calories and protein to promote weight maintenance. Patient will contact me with questions or concerns.  **Disclaimer: This note was dictated with voice recognition software. Similar sounding words can inadvertently be transcribed and this note may contain transcription errors which may not have been corrected upon publication of note.**

## 2014-11-08 NOTE — Patient Instructions (Addendum)
[facility] Discharge Instructions for Patients Receiving Chemotherapy  Today you received the following chemotherapy agents Oxaliplatin, Leucovorin and Adrucil infusion. To help prevent nausea and vomiting after your treatment, we encourage you to take your nausea medication  As prescribed.    If you develop nausea and vomiting that is not controlled by your nausea medication, call the clinic.   BELOW ARE SYMPTOMS THAT SHOULD BE REPORTED IMMEDIATELY:  *FEVER GREATER THAN 100.5 F  *CHILLS WITH OR WITHOUT FEVER  NAUSEA AND VOMITING THAT IS NOT CONTROLLED WITH YOUR NAUSEA MEDICATION  *UNUSUAL SHORTNESS OF BREATH  *UNUSUAL BRUISING OR BLEEDING  TENDERNESS IN MOUTH AND THROAT WITH OR WITHOUT PRESENCE OF ULCERS  *URINARY PROBLEMS  *BOWEL PROBLEMS  UNUSUAL RASH Items with * indicate a potential emergency and should be followed up as soon as possible.  Feel free to call the clinic you have any questions or concerns. The clinic phone number is [phone].  Please show the [place] at check-in to the Emergency Department and triage nurse.

## 2014-11-10 ENCOUNTER — Ambulatory Visit (HOSPITAL_BASED_OUTPATIENT_CLINIC_OR_DEPARTMENT_OTHER): Payer: Medicare Other

## 2014-11-10 VITALS — BP 128/58 | HR 65 | Temp 98.4°F | Resp 18

## 2014-11-10 DIAGNOSIS — C787 Secondary malignant neoplasm of liver and intrahepatic bile duct: Secondary | ICD-10-CM | POA: Diagnosis not present

## 2014-11-10 DIAGNOSIS — C772 Secondary and unspecified malignant neoplasm of intra-abdominal lymph nodes: Secondary | ICD-10-CM | POA: Diagnosis not present

## 2014-11-10 DIAGNOSIS — C179 Malignant neoplasm of small intestine, unspecified: Secondary | ICD-10-CM | POA: Diagnosis not present

## 2014-11-10 DIAGNOSIS — Z95828 Presence of other vascular implants and grafts: Secondary | ICD-10-CM

## 2014-11-10 MED ORDER — SODIUM CHLORIDE 0.9 % IJ SOLN
10.0000 mL | INTRAMUSCULAR | Status: DC | PRN
  Administered 2014-11-10: 10 mL via INTRAVENOUS
  Filled 2014-11-10: qty 10

## 2014-11-10 MED ORDER — HEPARIN SOD (PORK) LOCK FLUSH 100 UNIT/ML IV SOLN
500.0000 [IU] | Freq: Once | INTRAVENOUS | Status: AC
  Administered 2014-11-10: 500 [IU] via INTRAVENOUS
  Filled 2014-11-10: qty 5

## 2014-11-10 NOTE — Patient Instructions (Signed)
Fluorouracil, 5FU; Diclofenac topical cream What is this medicine? FLUOROURACIL; DICLOFENAC (flure oh YOOR a sil; dye KLOE fen ak) is a combination of a topical chemotherapy agent and non-steroidal anti-inflammatory drug (NSAID). It is used on the skin to treat skin cancer and skin conditions that could become cancer. This medicine may be used for other purposes; ask your health care provider or pharmacist if you have questions. COMMON BRAND NAME(S): FLUORAC What should I tell my health care provider before I take this medicine? They need to know if you have any of these conditions: -bleeding problems -cigarette smoker -DPD enzyme deficiency -heart disease -high blood pressure -if you frequently drink alcohol containing drinks -kidney disease -liver disease -open or infected skin -stomach problems -swelling or open sores at the treatment site -recent or planned coronary artery bypass graft (CABG) surgery -an unusual or allergic reaction to fluorouracil, diclofenac, aspirin, other NSAIDs, other medicines, foods, dyes, or preservatives -pregnant or trying to get pregnant -breast-feeding How should I use this medicine? This medicine is only for use on the skin. Follow the directions on the prescription label. Wash hands before and after use. Wash affected area and gently pat dry. To apply this medicine use a cotton-tipped applicator, or use gloves if applying with fingertips. If applied with unprotected fingertips, it is very important to wash your hands well after you apply this medicine. Avoid applying to the eyes, nose, or mouth. Apply enough medicine to cover the affected area. You can cover the area with a light gauze dressing, but do not use tight or air-tight dressings. Finish the full course prescribed by your doctor or health care professional, even if you think your condition is better. Do not stop taking except on the advice of your doctor or health care professional. Talk to your  pediatrician regarding the use of this medicine in children. Special care may be needed. Overdosage: If you think you've taken too much of this medicine contact a poison control center or emergency room at once. Overdosage: If you think you have taken too much of this medicine contact a poison control center or emergency room at once. NOTE: This medicine is only for you. Do not share this medicine with others. What if I miss a dose? If you miss a dose, apply it as soon as you can. If it is almost time for your next dose, only use that dose. Do not apply extra doses. Contact your doctor or health care professional if you miss more than one dose. What may interact with this medicine? Interactions are not expected. Do not use any other skin products without telling your doctor or health care professional. This list may not describe all possible interactions. Give your health care provider a list of all the medicines, herbs, non-prescription drugs, or dietary supplements you use. Also tell them if you smoke, drink alcohol, or use illegal drugs. Some items may interact with your medicine. What should I watch for while using this medicine? Visit your doctor or health care professional for checks on your progress. You will need to use this medicine for 2 to 6 weeks. This may be longer depending on the condition being treated. You may not see full healing for another 1 to 2 months after you stop using the medicine. Treated areas of skin can look unsightly during and for several weeks after treatment with this medicine. This medicine can make you more sensitive to the sun. Keep out of the sun. If you cannot avoid being in   the sun, wear protective clothing and use sunscreen. Do not use sun lamps or tanning beds/booths. Where should I keep my What side effects may I notice from receiving this medicine? Side effects that you should report to your doctor or health care professional as soon as possible: -allergic  reactions like skin rash, itching or hives, swelling of the face, lips, or tongue -black or bloody stools, blood in the urine or vomit -blurred vision -chest pain -difficulty breathing or wheezing -redness, blistering, peeling or loosening of the skin, including inside the mouth -severe redness and swelling of normal skin -slurred speech or weakness on one side of the body -trouble passing urine or change in the amount of urine -unexplained weight gain or swelling -unusually weak or tired -yellowing of eyes or skin Side effects that usually do not require medical attention (Report these to your doctor or health care professional if they continue or are bothersome.): -increased sensitivity of the skin to sun and ultraviolet light -pain and burning of the affected area -scaling or swelling of the affected area -skin rash, itching of the affected area -tenderness This list may not describe all possible side effects. Call your doctor for medical advice about side effects. You may report side effects to FDA at 1-800-FDA-1088. Where should I keep my medicine? Keep out of the reach of children. Store at room temperature between 20 and 25 degrees C (68 and 77 degrees F). Throw away any unused medicine after the expiration date. NOTE: This sheet is a summary. It may not cover all possible information. If you have questions about this medicine, talk to your doctor, pharmacist, or health care provider.  2015, Elsevier/Gold Standard. (2013-09-19 11:09:58)  

## 2014-11-14 ENCOUNTER — Other Ambulatory Visit: Payer: Medicare Other

## 2014-11-14 ENCOUNTER — Ambulatory Visit: Payer: Medicare Other | Admitting: Hematology

## 2014-11-15 ENCOUNTER — Ambulatory Visit: Payer: Medicare Other

## 2014-11-21 ENCOUNTER — Telehealth: Payer: Self-pay | Admitting: Nurse Practitioner

## 2014-11-21 ENCOUNTER — Telehealth: Payer: Self-pay | Admitting: *Deleted

## 2014-11-21 ENCOUNTER — Other Ambulatory Visit (HOSPITAL_BASED_OUTPATIENT_CLINIC_OR_DEPARTMENT_OTHER): Payer: Medicare Other

## 2014-11-21 ENCOUNTER — Ambulatory Visit (HOSPITAL_BASED_OUTPATIENT_CLINIC_OR_DEPARTMENT_OTHER): Payer: Medicare Other | Admitting: Nurse Practitioner

## 2014-11-21 VITALS — BP 125/64 | HR 70 | Temp 98.4°F | Resp 19 | Ht 66.0 in | Wt 208.5 lb

## 2014-11-21 DIAGNOSIS — N183 Chronic kidney disease, stage 3 (moderate): Secondary | ICD-10-CM | POA: Diagnosis not present

## 2014-11-21 DIAGNOSIS — R630 Anorexia: Secondary | ICD-10-CM

## 2014-11-21 DIAGNOSIS — C787 Secondary malignant neoplasm of liver and intrahepatic bile duct: Secondary | ICD-10-CM | POA: Diagnosis not present

## 2014-11-21 DIAGNOSIS — C179 Malignant neoplasm of small intestine, unspecified: Secondary | ICD-10-CM

## 2014-11-21 DIAGNOSIS — C772 Secondary and unspecified malignant neoplasm of intra-abdominal lymph nodes: Secondary | ICD-10-CM | POA: Diagnosis not present

## 2014-11-21 DIAGNOSIS — I10 Essential (primary) hypertension: Secondary | ICD-10-CM

## 2014-11-21 DIAGNOSIS — E119 Type 2 diabetes mellitus without complications: Secondary | ICD-10-CM

## 2014-11-21 DIAGNOSIS — D696 Thrombocytopenia, unspecified: Secondary | ICD-10-CM

## 2014-11-21 DIAGNOSIS — R634 Abnormal weight loss: Secondary | ICD-10-CM

## 2014-11-21 LAB — CBC & DIFF AND RETIC
BASO%: 0 % (ref 0.0–2.0)
BASOS ABS: 0 10*3/uL (ref 0.0–0.1)
EOS%: 1.3 % (ref 0.0–7.0)
Eosinophils Absolute: 0.1 10*3/uL (ref 0.0–0.5)
HCT: 34.1 % — ABNORMAL LOW (ref 38.4–49.9)
HEMOGLOBIN: 10.6 g/dL — AB (ref 13.0–17.1)
Immature Retic Fract: 19.1 % — ABNORMAL HIGH (ref 3.00–10.60)
LYMPH%: 32.6 % (ref 14.0–49.0)
MCH: 27.9 pg (ref 27.2–33.4)
MCHC: 31.1 g/dL — ABNORMAL LOW (ref 32.0–36.0)
MCV: 89.7 fL (ref 79.3–98.0)
MONO#: 0.5 10*3/uL (ref 0.1–0.9)
MONO%: 12.9 % (ref 0.0–14.0)
NEUT#: 2 10*3/uL (ref 1.5–6.5)
NEUT%: 53.2 % (ref 39.0–75.0)
PLATELETS: 77 10*3/uL — AB (ref 140–400)
RBC: 3.8 10*6/uL — ABNORMAL LOW (ref 4.20–5.82)
RDW: 23.9 % — ABNORMAL HIGH (ref 11.0–14.6)
Retic %: 1.78 % (ref 0.80–1.80)
Retic Ct Abs: 67.64 10*3/uL (ref 34.80–93.90)
WBC: 3.8 10*3/uL — ABNORMAL LOW (ref 4.0–10.3)
lymph#: 1.2 10*3/uL (ref 0.9–3.3)

## 2014-11-21 LAB — COMPREHENSIVE METABOLIC PANEL (CC13)
ALT: 27 U/L (ref 0–55)
AST: 40 U/L — ABNORMAL HIGH (ref 5–34)
Albumin: 2.7 g/dL — ABNORMAL LOW (ref 3.5–5.0)
Alkaline Phosphatase: 106 U/L (ref 40–150)
Anion Gap: 6 mEq/L (ref 3–11)
BILIRUBIN TOTAL: 0.37 mg/dL (ref 0.20–1.20)
BUN: 13.3 mg/dL (ref 7.0–26.0)
CO2: 28 mEq/L (ref 22–29)
Calcium: 9.1 mg/dL (ref 8.4–10.4)
Chloride: 109 mEq/L (ref 98–109)
Creatinine: 1 mg/dL (ref 0.7–1.3)
EGFR: 76 mL/min/{1.73_m2} — AB (ref >90)
GLUCOSE: 113 mg/dL (ref 70–140)
Potassium: 3.8 mEq/L (ref 3.5–5.1)
SODIUM: 143 meq/L (ref 136–145)
Total Protein: 6.6 g/dL (ref 6.4–8.3)

## 2014-11-21 LAB — TECHNOLOGIST REVIEW

## 2014-11-21 NOTE — Telephone Encounter (Signed)
per pof to sch pt appt-sent MW email to sch trmt-sent Delorise Royals see if could have MD only slot for pt-will call pt after reply-pt stated will review MY CHART as well

## 2014-11-21 NOTE — Telephone Encounter (Signed)
per pof to sch appt-*awaiting reply from [facility] on 12/13/14

## 2014-11-21 NOTE — Telephone Encounter (Signed)
Per staff message and POF I have scheduled appts. Advised scheduler of appts. JMW  

## 2014-11-21 NOTE — Progress Notes (Signed)
[facility] OFFICE PROGRESS NOTE   Diagnosis: Small bowel cancer metastatic to liver  Oncology History   Small bowel cancer  Staging form: Small Intestine, AJCC 7th Edition  Clinical: Stage IV (TX, N1, M1) - Unsigned       Small bowel cancer   08/17/2014 Imaging CT abdomen/pelvis without contrast showed multiple large masses within the liver and 2.5cm mass within the proximal jejunum.    08/18/2014 Pathology Results Liver biopsy showed metastatic adenocarcinoma, consistent with GI primary   08/18/2014 Initial Diagnosis Small bowel cancer   08/18/2014 Procedure small bowel enteroscopy with biopsy by Dr. Benson Norway showed at the proximal jejunum there was evidence of abnormal mucosa and friability, biopsy was obtained.    09/05/2014 Imaging PET hypermetabolic mass involving a small bowel loop with adjacent mesenteric lymphadenopathy, and diffuse liver metastasis and mild metabolic lymphadenopathy in porta hepatis.   09/06/2014 -  Chemotherapy mFOLFOX            09/07/2014 - 09/11/2014 Hospital Admission He was admitted for fever and GI bleeding. Received 3 units of RBC.    10/26/2014 Imaging Interval significant improvement in the primary small bowel mass, adjacent lymphadenopathy and extensive hepatic metastatic disease. No other new lesions.            INTERVAL HISTORY:   Tyler Evans returns as scheduled. He completed cycle 5 FOLFOX 11/08/2014. He denies nausea/vomiting. No mouth sores. No significant diarrhea. The cold sensitivity lasted 5-6 days. He denies persistent neuropathy symptoms. No bleeding. He denies pain. He has a good appetite.  Objective:  Vital signs in last 24 hours:  Blood pressure 125/64, pulse 70, temperature 98.4 F (36.9 C), temperature source Oral, resp. rate 19, height 5\' 6"  (1.676 m), weight 208 lb 8 oz (94.575 kg), SpO2 100 %.    HEENT: No thrush or ulcers. Resp: Lungs clear  bilaterally. Cardio: Regular rate and rhythm. GI: Abdomen soft and nontender. No hepatomegaly. No mass. Vascular: No leg edema. Calves soft and nontender. Neuro: Vibratory sense intact over the fingertips per tuning fork exam.  Skin: No rash. Port-A-Cath without erythema.    Lab Results:  Lab Results  Component Value Date   WBC 3.8* 11/21/2014   HGB 10.6* 11/21/2014   HCT 34.1* 11/21/2014   MCV 89.7 11/21/2014   PLT 77* 11/21/2014   NEUTROABS 2.0 11/21/2014    Imaging:  No results found.  Medications: I have reviewed the patient's current medications.  Assessment/Plan: 1. 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  CT abdomen/pelvis without contrast 08/17/2014 with multiple large masses within the liver, bulky in appearance. The largest measured approximately 5.7 cm. Focal filling defect within the proximal jejunum measuring approximately 2.5 x 1.7 cm. Mild adjacent jejunal wall thickening.  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   Cycle 1 FOLFOX 09/06/2014  Cycle 2 FOLFOX 09/20/2014  Cycle 3 FOLFOX 10/04/2014  Cycle 4 FOLFOX 10/18/2014  Restaging PET scan 10/26/2014 with interval significant improvement in the primary small bowel mass, adjacent lymphadenopathy and extensive hepatic metastatic disease. Small pulmonary nodules bilaterally unchanged. No evidence of disease progression.  Cycle 5 FOLFOX 11/08/2014 2. Microcytic anemia in the setting of heme-positive stool status post red cell transfusion support 08/17/2014. 3. Anorexia/weight loss secondary to #1. 4. Hypertension. 5. Diabetes. 6. CKD stage III 7. Hospitalization 09/07/2014 through 09/11/2014 with fever/sepsis, culture negative.   Disposition: Tyler Evans appears  stable. He has complete 5 cycles of FOLFOX. He is thrombocytopenia  on labs today. Dr. Burr Medico recommends holding today's treatment and rescheduling for one week. He will return for cycle 6 FOLFOX 11/29/2014. He will return for a follow-up visit and cycle 7 FOLFOX 12/13/2014. He will contact the office in the interim with any problems. We specifically discussed bleeding.  Plan reviewed with Dr. Burr Medico.    Ned Card ANP/GNP-BC   11/21/2014  9:44 AM

## 2014-11-22 ENCOUNTER — Ambulatory Visit: Payer: Medicare Other

## 2014-11-22 ENCOUNTER — Telehealth: Payer: Self-pay | Admitting: Hematology

## 2014-11-22 ENCOUNTER — Other Ambulatory Visit: Payer: Self-pay | Admitting: Hematology

## 2014-11-22 ENCOUNTER — Telehealth: Payer: Self-pay | Admitting: *Deleted

## 2014-11-22 NOTE — Telephone Encounter (Signed)
Per staff message and POF I have scheduled appt on 7/13. Advised scheduler of appts. JMW

## 2014-11-22 NOTE — Telephone Encounter (Signed)
per pof Dr Burr Medico ok pt to see her @9 :108 7/12-cld pt & left message of appt time & date

## 2014-11-29 ENCOUNTER — Ambulatory Visit (HOSPITAL_BASED_OUTPATIENT_CLINIC_OR_DEPARTMENT_OTHER): Payer: Medicare Other

## 2014-11-29 ENCOUNTER — Other Ambulatory Visit: Payer: Self-pay | Admitting: Hematology

## 2014-11-29 ENCOUNTER — Other Ambulatory Visit (HOSPITAL_BASED_OUTPATIENT_CLINIC_OR_DEPARTMENT_OTHER): Payer: Medicare Other

## 2014-11-29 ENCOUNTER — Other Ambulatory Visit: Payer: Medicare Other

## 2014-11-29 VITALS — BP 133/61 | HR 66 | Temp 98.2°F | Resp 18

## 2014-11-29 DIAGNOSIS — D696 Thrombocytopenia, unspecified: Secondary | ICD-10-CM

## 2014-11-29 DIAGNOSIS — C179 Malignant neoplasm of small intestine, unspecified: Secondary | ICD-10-CM

## 2014-11-29 DIAGNOSIS — E119 Type 2 diabetes mellitus without complications: Secondary | ICD-10-CM

## 2014-11-29 DIAGNOSIS — Z5111 Encounter for antineoplastic chemotherapy: Secondary | ICD-10-CM

## 2014-11-29 DIAGNOSIS — C171 Malignant neoplasm of jejunum: Secondary | ICD-10-CM

## 2014-11-29 DIAGNOSIS — C787 Secondary malignant neoplasm of liver and intrahepatic bile duct: Secondary | ICD-10-CM | POA: Diagnosis not present

## 2014-11-29 DIAGNOSIS — N183 Chronic kidney disease, stage 3 (moderate): Secondary | ICD-10-CM

## 2014-11-29 LAB — COMPREHENSIVE METABOLIC PANEL (CC13)
ALT: 20 U/L (ref 0–55)
ANION GAP: 8 meq/L (ref 3–11)
AST: 32 U/L (ref 5–34)
Albumin: 2.9 g/dL — ABNORMAL LOW (ref 3.5–5.0)
Alkaline Phosphatase: 99 U/L (ref 40–150)
BILIRUBIN TOTAL: 0.37 mg/dL (ref 0.20–1.20)
BUN: 17.1 mg/dL (ref 7.0–26.0)
CO2: 25 meq/L (ref 22–29)
Calcium: 9 mg/dL (ref 8.4–10.4)
Chloride: 106 mEq/L (ref 98–109)
Creatinine: 1.1 mg/dL (ref 0.7–1.3)
EGFR: 67 mL/min/{1.73_m2} — ABNORMAL LOW (ref >90)
Glucose: 100 mg/dl (ref 70–140)
POTASSIUM: 4 meq/L (ref 3.5–5.1)
SODIUM: 140 meq/L (ref 136–145)
Total Protein: 6.8 g/dL (ref 6.4–8.3)

## 2014-11-29 LAB — CBC WITH DIFFERENTIAL/PLATELET
BASO%: 0.6 % (ref 0.0–2.0)
Basophils Absolute: 0 10*3/uL (ref 0.0–0.1)
EOS ABS: 0.1 10*3/uL (ref 0.0–0.5)
EOS%: 2.1 % (ref 0.0–7.0)
HCT: 35.5 % — ABNORMAL LOW (ref 38.4–49.9)
HGB: 11.1 g/dL — ABNORMAL LOW (ref 13.0–17.1)
LYMPH%: 29.6 % (ref 14.0–49.0)
MCH: 28.5 pg (ref 27.2–33.4)
MCHC: 31.4 g/dL — ABNORMAL LOW (ref 32.0–36.0)
MCV: 90.7 fL (ref 79.3–98.0)
MONO#: 1.3 10*3/uL — AB (ref 0.1–0.9)
MONO%: 28.2 % — ABNORMAL HIGH (ref 0.0–14.0)
NEUT%: 39.5 % (ref 39.0–75.0)
NEUTROS ABS: 1.8 10*3/uL (ref 1.5–6.5)
PLATELETS: 232 10*3/uL (ref 140–400)
RBC: 3.91 10*6/uL — ABNORMAL LOW (ref 4.20–5.82)
RDW: 26 % — ABNORMAL HIGH (ref 11.0–14.6)
WBC: 4.5 10*3/uL (ref 4.0–10.3)
lymph#: 1.3 10*3/uL (ref 0.9–3.3)

## 2014-11-29 MED ORDER — SODIUM CHLORIDE 0.9 % IV SOLN
5000.0000 mg | INTRAVENOUS | Status: DC
  Administered 2014-11-29: 5000 mg via INTRAVENOUS
  Filled 2014-11-29: qty 100

## 2014-11-29 MED ORDER — DEXTROSE 5 % IV SOLN
250.0000 mL | Freq: Once | INTRAVENOUS | Status: AC
  Administered 2014-11-29: 250 mL via INTRAVENOUS

## 2014-11-29 MED ORDER — DEXTROSE 5 % IV SOLN
85.0000 mg/m2 | Freq: Once | INTRAVENOUS | Status: AC
  Administered 2014-11-29: 180 mg via INTRAVENOUS
  Filled 2014-11-29: qty 36

## 2014-11-29 MED ORDER — LEUCOVORIN CALCIUM INJECTION 350 MG
850.0000 mg | Freq: Once | INTRAVENOUS | Status: AC
  Administered 2014-11-29: 850 mg via INTRAVENOUS
  Filled 2014-11-29: qty 42.5

## 2014-11-29 MED ORDER — DEXAMETHASONE SODIUM PHOSPHATE 100 MG/10ML IJ SOLN
Freq: Once | INTRAMUSCULAR | Status: AC
  Administered 2014-11-29: 14:00:00 via INTRAVENOUS
  Filled 2014-11-29: qty 4

## 2014-11-29 NOTE — Patient Instructions (Signed)
[facility] Discharge Instructions for Patients Receiving Chemotherapy  Today you received the following chemotherapy agents:  Oxaliplatin, Leucovorin, Fluorouracil  To help prevent nausea and vomiting after your treatment, we encourage you to take your nausea medication as prescribed.   If you develop nausea and vomiting that is not controlled by your nausea medication, call the clinic.   BELOW ARE SYMPTOMS THAT SHOULD BE REPORTED IMMEDIATELY:  *FEVER GREATER THAN 100.5 F  *CHILLS WITH OR WITHOUT FEVER  NAUSEA AND VOMITING THAT IS NOT CONTROLLED WITH YOUR NAUSEA MEDICATION  *UNUSUAL SHORTNESS OF BREATH  *UNUSUAL BRUISING OR BLEEDING  TENDERNESS IN MOUTH AND THROAT WITH OR WITHOUT PRESENCE OF ULCERS  *URINARY PROBLEMS  *BOWEL PROBLEMS  UNUSUAL RASH Items with * indicate a potential emergency and should be followed up as soon as possible.  Feel free to call the clinic you have any questions or concerns. The clinic phone number is [phone].  Please show the CHEMO ALERT CARD at check-in to the Emergency Department and triage nurse.   

## 2014-11-30 ENCOUNTER — Other Ambulatory Visit: Payer: Self-pay | Admitting: *Deleted

## 2014-12-01 ENCOUNTER — Ambulatory Visit (HOSPITAL_BASED_OUTPATIENT_CLINIC_OR_DEPARTMENT_OTHER): Payer: Medicare Other

## 2014-12-01 ENCOUNTER — Ambulatory Visit: Payer: Medicare Other

## 2014-12-01 VITALS — BP 129/61 | HR 75 | Temp 98.6°F | Resp 18

## 2014-12-01 DIAGNOSIS — C171 Malignant neoplasm of jejunum: Secondary | ICD-10-CM | POA: Diagnosis not present

## 2014-12-01 DIAGNOSIS — Z5189 Encounter for other specified aftercare: Secondary | ICD-10-CM | POA: Diagnosis not present

## 2014-12-01 DIAGNOSIS — C179 Malignant neoplasm of small intestine, unspecified: Secondary | ICD-10-CM

## 2014-12-01 MED ORDER — SODIUM CHLORIDE 0.9 % IJ SOLN
10.0000 mL | INTRAMUSCULAR | Status: DC | PRN
  Administered 2014-12-01: 10 mL
  Filled 2014-12-01: qty 10

## 2014-12-01 MED ORDER — HEPARIN SOD (PORK) LOCK FLUSH 100 UNIT/ML IV SOLN
500.0000 [IU] | Freq: Once | INTRAVENOUS | Status: AC | PRN
  Administered 2014-12-01: 500 [IU]
  Filled 2014-12-01: qty 5

## 2014-12-01 MED ORDER — PEGFILGRASTIM INJECTION 6 MG/0.6ML ~~LOC~~
6.0000 mg | PREFILLED_SYRINGE | Freq: Once | SUBCUTANEOUS | Status: AC
  Administered 2014-12-01: 6 mg via SUBCUTANEOUS
  Filled 2014-12-01: qty 0.6

## 2014-12-01 NOTE — Patient Instructions (Signed)
Fluorouracil, 5FU; Diclofenac topical cream What is this medicine? FLUOROURACIL; DICLOFENAC (flure oh YOOR a sil; dye KLOE fen ak) is a combination of a topical chemotherapy agent and non-steroidal anti-inflammatory drug (NSAID). It is used on the skin to treat skin cancer and skin conditions that could become cancer. This medicine may be used for other purposes; ask your health care provider or pharmacist if you have questions. COMMON BRAND NAME(S): FLUORAC What should I tell my health care provider before I take this medicine? They need to know if you have any of these conditions: -bleeding problems -cigarette smoker -DPD enzyme deficiency -heart disease -high blood pressure -if you frequently drink alcohol containing drinks -kidney disease -liver disease -open or infected skin -stomach problems -swelling or open sores at the treatment site -recent or planned coronary artery bypass graft (CABG) surgery -an unusual or allergic reaction to fluorouracil, diclofenac, aspirin, other NSAIDs, other medicines, foods, dyes, or preservatives -pregnant or trying to get pregnant -breast-feeding How should I use this medicine? This medicine is only for use on the skin. Follow the directions on the prescription label. Wash hands before and after use. Wash affected area and gently pat dry. To apply this medicine use a cotton-tipped applicator, or use gloves if applying with fingertips. If applied with unprotected fingertips, it is very important to wash your hands well after you apply this medicine. Avoid applying to the eyes, nose, or mouth. Apply enough medicine to cover the affected area. You can cover the area with a light gauze dressing, but do not use tight or air-tight dressings. Finish the full course prescribed by your doctor or health care professional, even if you think your condition is better. Do not stop taking except on the advice of your doctor or health care professional. Talk to your  pediatrician regarding the use of this medicine in children. Special care may be needed. Overdosage: If you think you've taken too much of this medicine contact a poison control center or emergency room at once. Overdosage: If you think you have taken too much of this medicine contact a poison control center or emergency room at once. NOTE: This medicine is only for you. Do not share this medicine with others. What if I miss a dose? If you miss a dose, apply it as soon as you can. If it is almost time for your next dose, only use that dose. Do not apply extra doses. Contact your doctor or health care professional if you miss more than one dose. What may interact with this medicine? Interactions are not expected. Do not use any other skin products without telling your doctor or health care professional. This list may not describe all possible interactions. Give your health care provider a list of all the medicines, herbs, non-prescription drugs, or dietary supplements you use. Also tell them if you smoke, drink alcohol, or use illegal drugs. Some items may interact with your medicine. What should I watch for while using this medicine? Visit your doctor or health care professional for checks on your progress. You will need to use this medicine for 2 to 6 weeks. This may be longer depending on the condition being treated. You may not see full healing for another 1 to 2 months after you stop using the medicine. Treated areas of skin can look unsightly during and for several weeks after treatment with this medicine. This medicine can make you more sensitive to the sun. Keep out of the sun. If you cannot avoid being in   the sun, wear protective clothing and use sunscreen. Do not use sun lamps or tanning beds/booths. Where should I keep my What side effects may I notice from receiving this medicine? Side effects that you should report to your doctor or health care professional as soon as possible: -allergic  reactions like skin rash, itching or hives, swelling of the face, lips, or tongue -black or bloody stools, blood in the urine or vomit -blurred vision -chest pain -difficulty breathing or wheezing -redness, blistering, peeling or loosening of the skin, including inside the mouth -severe redness and swelling of normal skin -slurred speech or weakness on one side of the body -trouble passing urine or change in the amount of urine -unexplained weight gain or swelling -unusually weak or tired -yellowing of eyes or skin Side effects that usually do not require medical attention (Report these to your doctor or health care professional if they continue or are bothersome.): -increased sensitivity of the skin to sun and ultraviolet light -pain and burning of the affected area -scaling or swelling of the affected area -skin rash, itching of the affected area -tenderness This list may not describe all possible side effects. Call your doctor for medical advice about side effects. You may report side effects to FDA at 1-800-FDA-1088. Where should I keep my medicine? Keep out of the reach of children. Store at room temperature between 20 and 25 degrees C (68 and 77 degrees F). Throw away any unused medicine after the expiration date. NOTE: This sheet is a summary. It may not cover all possible information. If you have questions about this medicine, talk to your doctor, pharmacist, or health care provider.  2015, Elsevier/Gold Standard. (2013-09-19 11:09:58)  

## 2014-12-01 NOTE — Progress Notes (Signed)
Neulasta injection given by flush nurse 

## 2014-12-06 ENCOUNTER — Ambulatory Visit: Payer: Medicare Other

## 2014-12-06 ENCOUNTER — Other Ambulatory Visit: Payer: Medicare Other

## 2014-12-12 ENCOUNTER — Other Ambulatory Visit: Payer: Self-pay | Admitting: *Deleted

## 2014-12-12 ENCOUNTER — Telehealth: Payer: Self-pay | Admitting: *Deleted

## 2014-12-12 ENCOUNTER — Ambulatory Visit (HOSPITAL_BASED_OUTPATIENT_CLINIC_OR_DEPARTMENT_OTHER): Payer: Medicare Other | Admitting: Hematology

## 2014-12-12 ENCOUNTER — Encounter: Payer: Self-pay | Admitting: Hematology

## 2014-12-12 ENCOUNTER — Other Ambulatory Visit (HOSPITAL_BASED_OUTPATIENT_CLINIC_OR_DEPARTMENT_OTHER): Payer: Medicare Other

## 2014-12-12 VITALS — BP 137/68 | HR 68 | Temp 98.1°F | Resp 18 | Ht 66.0 in | Wt 209.1 lb

## 2014-12-12 DIAGNOSIS — D500 Iron deficiency anemia secondary to blood loss (chronic): Secondary | ICD-10-CM

## 2014-12-12 DIAGNOSIS — E119 Type 2 diabetes mellitus without complications: Secondary | ICD-10-CM

## 2014-12-12 DIAGNOSIS — C179 Malignant neoplasm of small intestine, unspecified: Secondary | ICD-10-CM

## 2014-12-12 DIAGNOSIS — C772 Secondary and unspecified malignant neoplasm of intra-abdominal lymph nodes: Secondary | ICD-10-CM

## 2014-12-12 DIAGNOSIS — R630 Anorexia: Secondary | ICD-10-CM

## 2014-12-12 DIAGNOSIS — E611 Iron deficiency: Secondary | ICD-10-CM

## 2014-12-12 DIAGNOSIS — C787 Secondary malignant neoplasm of liver and intrahepatic bile duct: Secondary | ICD-10-CM

## 2014-12-12 DIAGNOSIS — D696 Thrombocytopenia, unspecified: Secondary | ICD-10-CM

## 2014-12-12 DIAGNOSIS — I10 Essential (primary) hypertension: Secondary | ICD-10-CM

## 2014-12-12 DIAGNOSIS — D630 Anemia in neoplastic disease: Secondary | ICD-10-CM

## 2014-12-12 DIAGNOSIS — N186 End stage renal disease: Secondary | ICD-10-CM | POA: Diagnosis not present

## 2014-12-12 DIAGNOSIS — R634 Abnormal weight loss: Secondary | ICD-10-CM

## 2014-12-12 LAB — COMPREHENSIVE METABOLIC PANEL (CC13)
ALBUMIN: 2.9 g/dL — AB (ref 3.5–5.0)
ALT: 26 U/L (ref 0–55)
AST: 37 U/L — ABNORMAL HIGH (ref 5–34)
Alkaline Phosphatase: 145 U/L (ref 40–150)
Anion Gap: 8 mEq/L (ref 3–11)
BILIRUBIN TOTAL: 0.35 mg/dL (ref 0.20–1.20)
BUN: 8.5 mg/dL (ref 7.0–26.0)
CHLORIDE: 109 meq/L (ref 98–109)
CO2: 24 mEq/L (ref 22–29)
Calcium: 9.2 mg/dL (ref 8.4–10.4)
Creatinine: 1.1 mg/dL (ref 0.7–1.3)
EGFR: 68 mL/min/{1.73_m2} — ABNORMAL LOW (ref >90)
GLUCOSE: 72 mg/dL (ref 70–140)
POTASSIUM: 3.2 meq/L — AB (ref 3.5–5.1)
Sodium: 141 mEq/L (ref 136–145)
TOTAL PROTEIN: 7 g/dL (ref 6.4–8.3)

## 2014-12-12 LAB — CBC WITH DIFFERENTIAL/PLATELET
BASO%: 0.3 % (ref 0.0–2.0)
BASOS ABS: 0 10*3/uL (ref 0.0–0.1)
EOS%: 1 % (ref 0.0–7.0)
Eosinophils Absolute: 0.1 10*3/uL (ref 0.0–0.5)
HCT: 36.4 % — ABNORMAL LOW (ref 38.4–49.9)
HGB: 11.5 g/dL — ABNORMAL LOW (ref 13.0–17.1)
LYMPH#: 2.2 10*3/uL (ref 0.9–3.3)
LYMPH%: 31 % (ref 14.0–49.0)
MCH: 29 pg (ref 27.2–33.4)
MCHC: 31.6 g/dL — ABNORMAL LOW (ref 32.0–36.0)
MCV: 91.9 fL (ref 79.3–98.0)
MONO#: 1.3 10*3/uL — ABNORMAL HIGH (ref 0.1–0.9)
MONO%: 17.9 % — AB (ref 0.0–14.0)
NEUT#: 3.6 10*3/uL (ref 1.5–6.5)
NEUT%: 49.8 % (ref 39.0–75.0)
NRBC: 0 % (ref 0–0)
PLATELETS: 90 10*3/uL — AB (ref 140–400)
RBC: 3.96 10*6/uL — ABNORMAL LOW (ref 4.20–5.82)
RDW: 20.3 % — AB (ref 11.0–14.6)
WBC: 7.2 10*3/uL (ref 4.0–10.3)

## 2014-12-12 LAB — TECHNOLOGIST REVIEW

## 2014-12-12 MED ORDER — POTASSIUM CHLORIDE CRYS ER 20 MEQ PO TBCR: 20.0000 meq | EXTENDED_RELEASE_TABLET | Freq: Every day | ORAL | Status: DC

## 2014-12-12 NOTE — Progress Notes (Addendum)
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    08/18/2014 Miscellaneous tumor KRAS mutation (-)   08/18/2014 Pathology Results Liver biopsy showed metastatic adenocarcinoma, consistent with GI primary   08/18/2014 Initial Diagnosis Small bowel cancer   08/18/2014 Procedure small bowel enteroscopy with biopsy by Dr. Benson Norway showed at the proximal jejunum there was evidence of abnormal mucosa and friability, biopsy was obtained.    09/05/2014 Imaging PET hypermetabolic mass involving a small bowel loop with adjacent mesenteric lymphadenopathy, and diffuse liver metastasis and mild Pallor metabolic lymphadenopathy in porta hepatis.   09/06/2014 -  Chemotherapy mFOLFOX    09/07/2014 - 09/11/2014 Hospital Admission He was admitted for fever and GI bleeding. Received 3 units of RBC.   10/26/2014 Imaging Interval significant improvement in the primary small bowel mass, adjacent lymphadenopathy and extensive hepatic metastatic disease. No other new lesions.      HISTORY OF PRESENTING ILLNESS:  Tyler Evans 69 y.o. male is here because of a recent diagnosis of small bowel cancer. He presented to his PCP on 08/17/2014 with complaints of feeling weak and a one month history of a cough. He was found to be anemic and was referred to the emergency department. Hemoglobin was found to be 7.6, MCV 76.6; creatinine was elevated at 1.74. Stool was Hemoccult positive.  CT abdomen/pelvis without contrast  on 08/17/2014 showed multiple large masses within the liver, bulky in appearance. The largest measured approximately 5.7 cm. There appeared to be a focal filling defect within the proximal jejunum measuring approximately 2.5 x 1.7 cm. There was mild adjacent jejunal wall thickening. The lung bases were clear.  On 08/18/2014 he underwent a small bowel enteroscopy with biopsy by Dr. Benson Norway. The esophagus and gastric lumen were normal. At the proximal jejunum there was evidence of abnormal mucosa and friability. The pediatric colonoscope was not able to traverse the area. Biopsies were obtained. An ultraslim colonoscope was then utilized. This colonoscope was able to traverse the area of stenosis which measured approximately 3 cm in length. 50% of the lumen was ulcerated. 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 MMR stains are pending.  He was discharged home on 08/19/2014.  INTERIM HISTORY:  Mr. Deeney returns for follow-up. He is tolerating chemotherapy very well, no significant side effects. He has mild fatigue, but otherwise doing fairly well. He denies any significant pain, nausea, abdominal distention, or other symptoms. He is fairly good appetite and energy level. No fever or chills, his bowel movement is normal, no melena or hematochezia.  MEDICAL HISTORY:  Past Medical History  Diagnosis Date  . Diabetes mellitus without complication   . Hypertension   . Small bowel cancer 08/18/2014    SURGICAL HISTORY: Past Surgical History  Procedure Laterality Date  . Enteroscopy N/A 08/18/2014    Procedure: ENTEROSCOPY;  Surgeon: Carol Ada, MD;  Location: [facility];  Service: Endoscopy;  Laterality: N/A;    SOCIAL HISTORY: History  Social History  . Marital Status: Married    Spouse Name: N/A  . Number of Children: 2  . Years of Education: N/A   Occupational History  .  retired Ambulance person    Social History Main Topics  . Smoking status:  Never Smoker   . Smokeless tobacco: Not on file  . Alcohol Use: No  . Drug Use: Not on file  . Sexual Activity: Not on file   Other Topics Concern  . Not on file   Social History Narrative  He lives in [place]. He is married. He has 2 children both reported to be in good health. No tobacco or alcohol use. He is a retired Ambulance person.  FAMILY HISTORY: Family History  Problem Relation Age of Onset  . Heart failure, Alzheimer's  Mother   . Lung cancer Father   . Kidney cancer Brother     ALLERGIES:  is allergic to penicillins.  MEDICATIONS:  Current Outpatient Prescriptions  Medication Sig Dispense Refill  . atorvastatin (LIPITOR) 20 MG tablet Take 20 mg by mouth daily.    Marland Kitchen glipiZIDE (GLUCOTROL) 10 MG tablet Take 10 mg by mouth 2 (two) times daily before a meal.    . insulin glargine (LANTUS) 100 UNIT/ML injection Inject 40 Units into the skin at bedtime.    . pantoprazole (PROTONIX) 40 MG tablet Take 1 tablet (40 mg total) by mouth daily. 30 tablet 0  . chlorthalidone (HYGROTON) 25 MG tablet Take 25 mg by mouth daily.    Marland Kitchen lisinopril (PRINIVIL,ZESTRIL) 40 MG tablet Take 40 mg by mouth at bedtime.    . metFORMIN (GLUCOPHAGE) 1000 MG tablet Take 1,000 mg by mouth 2 (two) times daily with a meal.         REVIEW OF SYSTEMS:   Constitutional: Denies fevers, chills or abnormal night sweats; mild fatigue, weight is stable.  Eyes: Denies blurriness of vision, double vision or watery eyes Ears, nose, mouth, throat, and face: Denies mucositis or sore throat Respiratory: One month history of a nonproductive cough;  no shortness of breath Cardiovascular: Denies palpitation, chest discomfort or lower extremity swelling Gastrointestinal:  Denies nausea, heartburn. No dysphagia. He has noted less frequent bowel movements over the past 2 weeks. Does not characterize this as constipation. Skin: Denies abnormal skin rashes Lymphatics: Denies new lymphadenopathy or easy  bruising Neurological: Denies numbness, tingling. No extremity weakness Behavioral/Psych: Mood is stable, no new changes  All other systems were reviewed with the patient and are negative.  PHYSICAL EXAMINATION: ECOG PERFORMANCE STATUS: 1 - Symptomatic but completely ambulatory  Filed Vitals:   12/12/14 1003  BP: 137/68  Pulse: 68  Temp: 98.1 F (36.7 C)  Resp: 18   Filed Weights   12/12/14 1003  Weight: 209 lb 1.6 oz (94.847 kg)    GENERAL:alert, no distress and comfortable SKIN: skin color, texture, turgor are normal, no rashes or significant lesions EYES: normal, conjunctiva are pink and non-injected, sclera clear OROPHARYNX:no exudate, no erythema and lips, buccal mucosa, and tongue normal  NECK: supple, thyroid normal size, non-tender, without nodularity LYMPH:  no palpable lymphadenopathy in the cervical, axillary or inguinal regions LUNGS: clear to auscultation and percussion with normal breathing effort HEART: regular rate & rhythm and no murmurs and no lower extremity edema ABDOMEN:abdomen soft, mild tenderness at the right upper quadrant , no rebound pain, normal bowel sounds Musculoskeletal:no cyanosis of digits and no clubbing  PSYCH: alert & oriented x 3 with fluent speech NEURO: no focal motor/sensory  deficits  LABORATORY DATA:  I have reviewed the data as listed CBC Latest Ref Rng 12/12/2014 11/29/2014 11/21/2014  WBC 4.0 - 10.3 10e3/uL 7.2 4.5 3.8(L)  Hemoglobin 13.0 - 17.1 g/dL 11.5(L) 11.1(L) 10.6(L)  Hematocrit 38.4 - 49.9 % 36.4(L) 35.5(L) 34.1(L)  Platelets 140 - 400 10e3/uL 90(L) 232 77(L)    CMP Latest Ref Rng 12/12/2014 11/29/2014 11/21/2014  Glucose 70 - 140 mg/dl 72 100 113  BUN 7.0 - 26.0 mg/dL 8.5 17.1 13.3  Creatinine 0.7 - 1.3 mg/dL 1.1 1.1 1.0  Sodium 136 - 145 mEq/L 141 140 143  Potassium 3.5 - 5.1 mEq/L 3.2(L) 4.0 3.8  Chloride 96 - 112 mmol/L - - -  CO2 22 - 29 mEq/L _0 Calcium 8.4 - 10.4 mg/dL 9.2 9.0 9.1  Total Protein 6.4 - 8.3  g/dL 7.0 6.8 6.6  Total Bilirubin 0.20 - 1.20 mg/dL 0.35 0.37 0.37  Alkaline Phos 40 - 150 U/L 145 99 106  AST 5 - 34 U/L 37(H) 32 40(H)  ALT 0 - 55 U/L _1 Pathology report:  Small Intestine Biopsy, jejunal mass 08/18/2014 - INVASIVE ADENOCARCINOMA IN A BACKGROUND OF TUBULOVILLOUS ADENOMA. ADDITIONAL INFORMATION: Mismatch Repair (MMR) Protein Immunohistochemistry (IHC) IHC Expression Result (LIMITED TUMOR): MLH1: Preserved nuclear expression (greater 50% tumor expression) MSH2: Preserved nuclear expression (greater 50% tumor expression) MSH6: Preserved nuclear expression (greater 50% tumor expression) PMS2: Preserved nuclear expression (greater 50% tumor expression) * Internal control demonstrates intact nuclear expression Interpretation: NORMAL There is preserved expression  Diagnosis 08/28/2014  Liver, needle/core biopsy, right lobe - METASTATIC ADENOCARCINOMA   RADIOGRAPHIC STUDIES: I have personally reviewed the radiological images as listed and agreed with the findings in the report.  Nm Pet Image Initial (pi) Skull Base To Thigh 09/05/2014    IMPRESSION: Hypermetabolic mass involving a small bowel loop with adjacent mesenteric lymphadenopathy in the left abdomen, consistent with known primary small bowel carcinoma. No evidence of small bowel obstruction.  Diffuse liver metastases. Mild hypermetabolic lymphadenopathy in porta hepatis, also suspicious for metastatic disease.  No evidence of metastatic disease within the neck, chest, or pelvis.  Incidental findings including cholelithiasis, diverticulosis, and mildly enlarged prostate.   Electronically Signed   By: Earle Gell M.D.   On: 09/05/2014 17:29   PET 10/26/2014  IMPRESSION: 1. Interval significant improvement in the primary small bowel mass, adjacent lymphadenopathy and extensive hepatic metastatic disease. 2. No disease progression identified. 3. No significant findings in the neck or chest. Small  pulmonary nodules bilaterally are unchanged.   ASSESSMENT & PLAN:  69 year old gentleman with past medical history of diabetes, hypertension, who presents with anemia and weakness.  1.Small bowel cancer metastatic to the liver and abdominal nodes, MMR normal, KRAS/NRAS wild type -I reviewed his imaging findings, jejunum biopsy and liver biopsy results with patient and his wife extensively.  -Unfortunately he has stage IV disease with diffuse liver metastasis, this is an incurable disease, with overall very poor prognosis. -I recommend systemic chemotherapy with FOLFOX, with a goal of palliation and prolonged his life. -I discussed his restaging PET scan results with patient and his wife. He has had grade partial response, with significant interval improvement in the primary small bowel mass and liver metastasis. No other new lesions. -He is clinically doing very well, CEA continue trending down, responding to treatment well. -He is tolerating FOLFOX very well, we'll continue -given the negative KRAS/NRAS GENE MUTATION, I would consider EGFR targeted therapy, with second line  chemo.  -Lab reviewed, he has mild thrombocytopenia, we'll postpone his chemotherapy to next Monday, with slight dose reduction. -We discussed that he may need surgery or radiation if he has recurrent severe GI bleeding. He agrees. Hopefully the chemotherapy will control his cancer, and prevent further bleeding episodes.  2. Microcytic anemia secondary to GI bleeding and iron deficiency -His serum iron level and saturation were low, although ferritin is normal, he has some degree of iron deficient anemia, and anemia of malignancy  -Consider blood transfusion if hemoglobin less than 8 or symptomatic anemia -he received IV feraheme 510 mg twice in 09/2014 -Continue OTC iron pill 1 tablet daily  -His hemoglobin is 11.5, stable .  3. anorexia/weight loss secondary to #1. -Much improved, he has gained some weight back lately     4. Hypertension and DM  -He will continue follow-up with his primary care physician. -We discussed that we will monitor his blood pressure and blood sugar closely and may need to adjust his medication during the chemotherapy   5. CKD, stage III -He is currently stable at 1.1, stable  -I encouraged him to drink adequately and avoid dehydration.  6. Hypocalcemia -Potassium 3.2 today -I codeine potassium chloride 20 mEq daily for 10 days with 2 refills.   Plan: -postpone cycle 7 chemo from tomorrow to next Monday, with 86f dose reduction to 2200 mg/m. -Change FOLFOX to every 16 days to avoid possible delaying of his chemotherapy due to cytopenia  -He will see APP the day before cycle 8 and and me before cycle 9   FTruitt Merle 12/12/2014

## 2014-12-12 NOTE — Telephone Encounter (Signed)
Spoke with pt and informed pt re: K+ 3.2 today.  Dr. Burr Medico would like for pt to take K-dur 20 mEq daily for 10 days.  Gave pt suggestions for dietary intake high in potassium.  Pt voiced understanding.  K-dur script called in to pt's pharmacy.

## 2014-12-13 ENCOUNTER — Other Ambulatory Visit: Payer: Medicare Other

## 2014-12-13 ENCOUNTER — Ambulatory Visit: Payer: Medicare Other

## 2014-12-13 ENCOUNTER — Telehealth: Payer: Self-pay | Admitting: Hematology

## 2014-12-13 LAB — CEA: CEA: 9 ng/mL — ABNORMAL HIGH (ref 0.0–5.0)

## 2014-12-13 NOTE — Telephone Encounter (Signed)
s.w. pt and advised on 7.18 appt....pt ok and aware

## 2014-12-15 ENCOUNTER — Other Ambulatory Visit: Payer: Medicare Other

## 2014-12-15 ENCOUNTER — Ambulatory Visit: Payer: Medicare Other | Admitting: Hematology

## 2014-12-18 ENCOUNTER — Other Ambulatory Visit (HOSPITAL_BASED_OUTPATIENT_CLINIC_OR_DEPARTMENT_OTHER): Payer: Medicare Other

## 2014-12-18 ENCOUNTER — Ambulatory Visit (HOSPITAL_BASED_OUTPATIENT_CLINIC_OR_DEPARTMENT_OTHER): Payer: Medicare Other

## 2014-12-18 VITALS — BP 129/63 | HR 70 | Temp 98.2°F | Resp 18

## 2014-12-18 DIAGNOSIS — C787 Secondary malignant neoplasm of liver and intrahepatic bile duct: Secondary | ICD-10-CM | POA: Diagnosis not present

## 2014-12-18 DIAGNOSIS — Z5111 Encounter for antineoplastic chemotherapy: Secondary | ICD-10-CM | POA: Diagnosis not present

## 2014-12-18 DIAGNOSIS — C772 Secondary and unspecified malignant neoplasm of intra-abdominal lymph nodes: Secondary | ICD-10-CM | POA: Diagnosis not present

## 2014-12-18 DIAGNOSIS — C179 Malignant neoplasm of small intestine, unspecified: Secondary | ICD-10-CM

## 2014-12-18 LAB — CBC & DIFF AND RETIC
BASO%: 0.2 % (ref 0.0–2.0)
Basophils Absolute: 0 10*3/uL (ref 0.0–0.1)
EOS ABS: 0.1 10*3/uL (ref 0.0–0.5)
EOS%: 1.5 % (ref 0.0–7.0)
HEMATOCRIT: 36.5 % — AB (ref 38.4–49.9)
HGB: 11.6 g/dL — ABNORMAL LOW (ref 13.0–17.1)
Immature Retic Fract: 17.8 % — ABNORMAL HIGH (ref 3.00–10.60)
LYMPH#: 1.5 10*3/uL (ref 0.9–3.3)
LYMPH%: 29.1 % (ref 14.0–49.0)
MCH: 29.2 pg (ref 27.2–33.4)
MCHC: 31.8 g/dL — ABNORMAL LOW (ref 32.0–36.0)
MCV: 91.9 fL (ref 79.3–98.0)
MONO#: 1 10*3/uL — AB (ref 0.1–0.9)
MONO%: 18.9 % — ABNORMAL HIGH (ref 0.0–14.0)
NEUT#: 2.6 10*3/uL (ref 1.5–6.5)
NEUT%: 50.3 % (ref 39.0–75.0)
Platelets: 167 10*3/uL (ref 140–400)
RBC: 3.97 10*6/uL — ABNORMAL LOW (ref 4.20–5.82)
RDW: 19.8 % — ABNORMAL HIGH (ref 11.0–14.6)
RETIC CT ABS: 64.31 10*3/uL (ref 34.80–93.90)
Retic %: 1.62 % (ref 0.80–1.80)
WBC: 5.2 10*3/uL (ref 4.0–10.3)
nRBC: 0 % (ref 0–0)

## 2014-12-18 LAB — COMPREHENSIVE METABOLIC PANEL (CC13)
ALT: 18 U/L (ref 0–55)
AST: 31 U/L (ref 5–34)
Albumin: 2.9 g/dL — ABNORMAL LOW (ref 3.5–5.0)
Alkaline Phosphatase: 112 U/L (ref 40–150)
Anion Gap: 6 mEq/L (ref 3–11)
BUN: 14.1 mg/dL (ref 7.0–26.0)
CALCIUM: 9.3 mg/dL (ref 8.4–10.4)
CO2: 24 meq/L (ref 22–29)
CREATININE: 1.1 mg/dL (ref 0.7–1.3)
Chloride: 109 mEq/L (ref 98–109)
EGFR: 70 mL/min/{1.73_m2} — ABNORMAL LOW (ref >90)
Glucose: 151 mg/dl — ABNORMAL HIGH (ref 70–140)
POTASSIUM: 4 meq/L (ref 3.5–5.1)
Sodium: 139 mEq/L (ref 136–145)
TOTAL PROTEIN: 6.9 g/dL (ref 6.4–8.3)
Total Bilirubin: 0.32 mg/dL (ref 0.20–1.20)

## 2014-12-18 MED ORDER — SODIUM CHLORIDE 0.9 % IV SOLN
Freq: Once | INTRAVENOUS | Status: AC
  Administered 2014-12-18: 13:00:00 via INTRAVENOUS
  Filled 2014-12-18: qty 4

## 2014-12-18 MED ORDER — OXALIPLATIN CHEMO INJECTION 100 MG/20ML
85.0000 mg/m2 | Freq: Once | INTRAVENOUS | Status: AC
  Administered 2014-12-18: 180 mg via INTRAVENOUS
  Filled 2014-12-18: qty 36

## 2014-12-18 MED ORDER — DEXTROSE 5 % IV SOLN
403.0000 mg/m2 | Freq: Once | INTRAVENOUS | Status: AC
  Administered 2014-12-18: 850 mg via INTRAVENOUS
  Filled 2014-12-18: qty 42.5

## 2014-12-18 MED ORDER — DEXTROSE 5 % IV SOLN
Freq: Once | INTRAVENOUS | Status: AC
  Administered 2014-12-18: 12:00:00 via INTRAVENOUS

## 2014-12-18 MED ORDER — SODIUM CHLORIDE 0.9 % IV SOLN
2200.0000 mg/m2 | INTRAVENOUS | Status: DC
  Administered 2014-12-18: 4650 mg via INTRAVENOUS
  Filled 2014-12-18: qty 93

## 2014-12-18 NOTE — Patient Instructions (Signed)
[facility] Discharge Instructions for Patients Receiving Chemotherapy  Today you received the following chemotherapy agents: Oxaliplatin, Leucovorin, and Adrucil   To help prevent nausea and vomiting after your treatment, we encourage you to take your nausea medication as directed.    If you develop nausea and vomiting that is not controlled by your nausea medication, call the clinic.   BELOW ARE SYMPTOMS THAT SHOULD BE REPORTED IMMEDIATELY:  *FEVER GREATER THAN 100.5 F  *CHILLS WITH OR WITHOUT FEVER  NAUSEA AND VOMITING THAT IS NOT CONTROLLED WITH YOUR NAUSEA MEDICATION  *UNUSUAL SHORTNESS OF BREATH  *UNUSUAL BRUISING OR BLEEDING  TENDERNESS IN MOUTH AND THROAT WITH OR WITHOUT PRESENCE OF ULCERS  *URINARY PROBLEMS  *BOWEL PROBLEMS  UNUSUAL RASH Items with * indicate a potential emergency and should be followed up as soon as possible.  Feel free to call the clinic you have any questions or concerns. The clinic phone number is [phone].  Please show the CHEMO ALERT CARD at check-in to the Emergency Department and triage nurse.   

## 2014-12-19 ENCOUNTER — Other Ambulatory Visit: Payer: Medicare Other

## 2014-12-19 ENCOUNTER — Ambulatory Visit: Payer: Medicare Other | Admitting: Hematology

## 2014-12-19 LAB — CEA: CEA: 7.3 ng/mL — AB (ref 0.0–5.0)

## 2014-12-20 ENCOUNTER — Ambulatory Visit (HOSPITAL_BASED_OUTPATIENT_CLINIC_OR_DEPARTMENT_OTHER): Payer: Medicare Other

## 2014-12-20 VITALS — BP 121/52 | HR 68 | Temp 97.8°F | Resp 20

## 2014-12-20 DIAGNOSIS — Z5189 Encounter for other specified aftercare: Secondary | ICD-10-CM

## 2014-12-20 DIAGNOSIS — C772 Secondary and unspecified malignant neoplasm of intra-abdominal lymph nodes: Secondary | ICD-10-CM | POA: Diagnosis not present

## 2014-12-20 DIAGNOSIS — C787 Secondary malignant neoplasm of liver and intrahepatic bile duct: Secondary | ICD-10-CM | POA: Diagnosis not present

## 2014-12-20 DIAGNOSIS — C179 Malignant neoplasm of small intestine, unspecified: Secondary | ICD-10-CM

## 2014-12-20 MED ORDER — SODIUM CHLORIDE 0.9 % IJ SOLN
10.0000 mL | INTRAMUSCULAR | Status: DC | PRN
  Administered 2014-12-20: 10 mL
  Filled 2014-12-20: qty 10

## 2014-12-20 MED ORDER — PEGFILGRASTIM INJECTION 6 MG/0.6ML ~~LOC~~
6.0000 mg | PREFILLED_SYRINGE | Freq: Once | SUBCUTANEOUS | Status: AC
  Administered 2014-12-20: 6 mg via SUBCUTANEOUS
  Filled 2014-12-20: qty 0.6

## 2014-12-20 MED ORDER — HEPARIN SOD (PORK) LOCK FLUSH 100 UNIT/ML IV SOLN
500.0000 [IU] | Freq: Once | INTRAVENOUS | Status: AC | PRN
  Administered 2014-12-20: 500 [IU]
  Filled 2014-12-20: qty 5

## 2014-12-20 NOTE — Patient Instructions (Signed)

## 2015-01-01 ENCOUNTER — Telehealth: Payer: Self-pay | Admitting: *Deleted

## 2015-01-01 ENCOUNTER — Other Ambulatory Visit (HOSPITAL_BASED_OUTPATIENT_CLINIC_OR_DEPARTMENT_OTHER): Payer: Medicare Other

## 2015-01-01 ENCOUNTER — Ambulatory Visit (HOSPITAL_BASED_OUTPATIENT_CLINIC_OR_DEPARTMENT_OTHER): Payer: Medicare Other | Admitting: Nurse Practitioner

## 2015-01-01 ENCOUNTER — Other Ambulatory Visit: Payer: Self-pay | Admitting: Hematology

## 2015-01-01 ENCOUNTER — Ambulatory Visit: Payer: Medicare Other

## 2015-01-01 VITALS — BP 124/64 | HR 70 | Temp 97.5°F | Resp 18 | Ht 66.0 in | Wt 210.5 lb

## 2015-01-01 DIAGNOSIS — C772 Secondary and unspecified malignant neoplasm of intra-abdominal lymph nodes: Secondary | ICD-10-CM | POA: Diagnosis not present

## 2015-01-01 DIAGNOSIS — N183 Chronic kidney disease, stage 3 (moderate): Secondary | ICD-10-CM | POA: Diagnosis not present

## 2015-01-01 DIAGNOSIS — C179 Malignant neoplasm of small intestine, unspecified: Secondary | ICD-10-CM

## 2015-01-01 DIAGNOSIS — R630 Anorexia: Secondary | ICD-10-CM

## 2015-01-01 DIAGNOSIS — R634 Abnormal weight loss: Secondary | ICD-10-CM

## 2015-01-01 DIAGNOSIS — C787 Secondary malignant neoplasm of liver and intrahepatic bile duct: Secondary | ICD-10-CM

## 2015-01-01 DIAGNOSIS — I10 Essential (primary) hypertension: Secondary | ICD-10-CM

## 2015-01-01 DIAGNOSIS — D6959 Other secondary thrombocytopenia: Secondary | ICD-10-CM

## 2015-01-01 DIAGNOSIS — D509 Iron deficiency anemia, unspecified: Secondary | ICD-10-CM

## 2015-01-01 DIAGNOSIS — E119 Type 2 diabetes mellitus without complications: Secondary | ICD-10-CM

## 2015-01-01 LAB — COMPREHENSIVE METABOLIC PANEL (CC13)
ALT: 24 U/L (ref 0–55)
AST: 36 U/L — ABNORMAL HIGH (ref 5–34)
Albumin: 3.1 g/dL — ABNORMAL LOW (ref 3.5–5.0)
Alkaline Phosphatase: 153 U/L — ABNORMAL HIGH (ref 40–150)
Anion Gap: 4 mEq/L (ref 3–11)
BUN: 8.8 mg/dL (ref 7.0–26.0)
CALCIUM: 9.2 mg/dL (ref 8.4–10.4)
CO2: 26 meq/L (ref 22–29)
Chloride: 111 mEq/L — ABNORMAL HIGH (ref 98–109)
Creatinine: 1 mg/dL (ref 0.7–1.3)
EGFR: 80 mL/min/{1.73_m2} — ABNORMAL LOW (ref >90)
GLUCOSE: 64 mg/dL — AB (ref 70–140)
POTASSIUM: 3.4 meq/L — AB (ref 3.5–5.1)
Sodium: 141 mEq/L (ref 136–145)
Total Bilirubin: 0.36 mg/dL (ref 0.20–1.20)
Total Protein: 7.1 g/dL (ref 6.4–8.3)

## 2015-01-01 LAB — CBC & DIFF AND RETIC
BASO%: 0.2 % (ref 0.0–2.0)
Basophils Absolute: 0 10*3/uL (ref 0.0–0.1)
EOS%: 1.8 % (ref 0.0–7.0)
Eosinophils Absolute: 0.2 10*3/uL (ref 0.0–0.5)
HCT: 36.9 % — ABNORMAL LOW (ref 38.4–49.9)
HGB: 11.9 g/dL — ABNORMAL LOW (ref 13.0–17.1)
Immature Retic Fract: 24.7 % — ABNORMAL HIGH (ref 3.00–10.60)
LYMPH%: 28.1 % (ref 14.0–49.0)
MCH: 29.9 pg (ref 27.2–33.4)
MCHC: 32.2 g/dL (ref 32.0–36.0)
MCV: 92.7 fL (ref 79.3–98.0)
MONO#: 1.3 10*3/uL — ABNORMAL HIGH (ref 0.1–0.9)
MONO%: 12 % (ref 0.0–14.0)
NEUT#: 6.1 10*3/uL (ref 1.5–6.5)
NEUT%: 57.9 % (ref 39.0–75.0)
Platelets: 88 10*3/uL — ABNORMAL LOW (ref 140–400)
RBC: 3.98 10*6/uL — AB (ref 4.20–5.82)
RDW: 17.8 % — ABNORMAL HIGH (ref 11.0–14.6)
RETIC %: 2.08 % — AB (ref 0.80–1.80)
Retic Ct Abs: 82.78 10*3/uL (ref 34.80–93.90)
WBC: 10.5 10*3/uL — ABNORMAL HIGH (ref 4.0–10.3)
lymph#: 2.9 10*3/uL (ref 0.9–3.3)
nRBC: 0 % (ref 0–0)

## 2015-01-01 NOTE — Telephone Encounter (Signed)
Per staff message and POF I have scheduled appts. Advised scheduler of appts. JMW  

## 2015-01-01 NOTE — Progress Notes (Signed)
[facility] OFFICE PROGRESS NOTE   Diagnosis: Small bowel cancer metastatic to liver  Oncology History   Small bowel cancer  Staging form: Small Intestine, AJCC 7th Edition  Clinical: Stage IV (TX, N1, M1) - Unsigned       Small bowel cancer   08/17/2014 Imaging CT abdomen/pelvis without contrast showed multiple large masses within the liver and 2.5cm mass within the proximal jejunum.    08/18/2014 Pathology Results Liver biopsy showed metastatic adenocarcinoma, consistent with GI primary   08/18/2014 Initial Diagnosis Small bowel cancer   08/18/2014 Procedure small bowel enteroscopy with biopsy by Dr. Benson Norway showed at the proximal jejunum there was evidence of abnormal mucosa and friability, biopsy was obtained.    09/05/2014 Imaging PET hypermetabolic mass involving a small bowel loop with adjacent mesenteric lymphadenopathy, and diffuse liver metastasis and mild metabolic lymphadenopathy in porta hepatis.   09/06/2014 -  Chemotherapy mFOLFOX            09/07/2014 - 09/11/2014 Hospital Admission He was admitted for fever and GI bleeding. Received 3 units of RBC.    10/26/2014 Imaging Interval significant improvement in the primary small bowel mass, adjacent lymphadenopathy and extensive hepatic metastatic disease. No other new lesions.                INTERVAL HISTORY:   [name] returns as scheduled. He completed cycle 7 FOLFOX 12/18/2014. He denies nausea/vomiting. No mouth sores. No diarrhea. The cold sensitivity lasted 4 days in his mouth. He has persistent mild cold sensitivity with touch. No neuropathy symptoms unrelated to cold exposure. He notes that he is sleepy 3-4 days after each treatment. He denies any bleeding. He has a good appetite. He denies pain.  Objective:  Vital signs in last 24 hours:  Blood pressure 124/64, pulse 70, temperature 97.5 F  (36.4 C), temperature source Oral, resp. rate 18, height 5\' 6"  (1.676 m), weight 210 lb 8 oz (95.482 kg), SpO2 100 %.    HEENT: No thrush or ulcers. Resp: Lungs clear bilaterally. Cardio: Regular rate and rhythm. GI: Abdomen soft and nontender. No hepatomegaly. Vascular: No leg edema. Neuro: Vibratory sense mildly decreased over the fingertips per tuning fork exam.  Port-A-Cath without erythema.    Lab Results:  Lab Results  Component Value Date   WBC 10.5* 01/01/2015   HGB 11.9* 01/01/2015   HCT 36.9* 01/01/2015   MCV 92.7 01/01/2015   PLT 88* 01/01/2015   NEUTROABS 6.1 01/01/2015    Imaging:  No results found.  Medications: I have reviewed the patient's current medications.  Assessment/Plan: 1. 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  CT abdomen/pelvis without contrast 08/17/2014 with multiple large masses within the liver, bulky in appearance. The largest measured approximately 5.7 cm. Focal filling defect within the proximal jejunum measuring approximately 2.5 x 1.7 cm. Mild adjacent jejunal wall thickening.  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   Cycle 1 FOLFOX 09/06/2014  Cycle 2 FOLFOX 09/20/2014  Cycle 3 FOLFOX 10/04/2014  Cycle 4 FOLFOX 10/18/2014  Restaging PET scan 10/26/2014 with interval significant improvement in the primary small bowel mass, adjacent lymphadenopathy and extensive hepatic metastatic disease. Small pulmonary nodules bilaterally unchanged. No evidence of disease progression.  Cycle 5 FOLFOX 11/08/2014  Cycle 6 FOLFOX 11/29/2014  Cycle 7 FOLFOX 12/18/2014 2. Microcytic anemia in the setting of heme-positive stool status post red cell transfusion  support 08/17/2014. 3. Anorexia/weight loss secondary to #1. 4. Hypertension. 5. Diabetes. 6. CKD stage  III 7. Hospitalization 09/07/2014 through 09/11/2014 with fever/sepsis, culture negative. 8. Thrombocytopenia secondary to chemotherapy.   Disposition: [name] appears stable. He has completed 7 cycles of FOLFOX. He has thrombocytopenia on labs today. Dr. Burr Medico recommends holding today's treatment and rescheduling to 01/04/2015. Further dose reductions will be made beginning with cycle 8 on 01/04/2015. He will return for a follow-up visit and cycle 9 FOLFOX on 01/18/2015. He will contact the office in the interim with any problems.  Plan reviewed with Dr. Burr Medico.    Ned Card ANP/GNP-BC   01/01/2015  12:06 PM

## 2015-01-02 ENCOUNTER — Telehealth: Payer: Self-pay | Admitting: Nurse Practitioner

## 2015-01-02 ENCOUNTER — Telehealth: Payer: Self-pay | Admitting: Hematology

## 2015-01-02 LAB — CEA: CEA: 6.3 ng/mL — ABNORMAL HIGH (ref 0.0–5.0)

## 2015-01-02 NOTE — Telephone Encounter (Signed)
per pof to sch pt appt-cld & spoke to pt & adv of time & date of appt on 8/4-pt understood

## 2015-01-02 NOTE — Telephone Encounter (Signed)
Spoke with patient and he is aware of his upcoming appointments

## 2015-01-04 ENCOUNTER — Ambulatory Visit (HOSPITAL_BASED_OUTPATIENT_CLINIC_OR_DEPARTMENT_OTHER): Payer: Medicare Other

## 2015-01-04 ENCOUNTER — Encounter: Payer: Self-pay | Admitting: *Deleted

## 2015-01-04 ENCOUNTER — Telehealth: Payer: Self-pay | Admitting: *Deleted

## 2015-01-04 ENCOUNTER — Other Ambulatory Visit (HOSPITAL_BASED_OUTPATIENT_CLINIC_OR_DEPARTMENT_OTHER): Payer: Medicare Other

## 2015-01-04 VITALS — BP 129/60 | HR 68 | Temp 97.0°F

## 2015-01-04 DIAGNOSIS — C787 Secondary malignant neoplasm of liver and intrahepatic bile duct: Secondary | ICD-10-CM

## 2015-01-04 DIAGNOSIS — C179 Malignant neoplasm of small intestine, unspecified: Secondary | ICD-10-CM

## 2015-01-04 DIAGNOSIS — Z5111 Encounter for antineoplastic chemotherapy: Secondary | ICD-10-CM | POA: Diagnosis not present

## 2015-01-04 DIAGNOSIS — C772 Secondary and unspecified malignant neoplasm of intra-abdominal lymph nodes: Secondary | ICD-10-CM

## 2015-01-04 LAB — CBC WITH DIFFERENTIAL/PLATELET
BASO%: 0.1 % (ref 0.0–2.0)
Basophils Absolute: 0 10*3/uL (ref 0.0–0.1)
EOS ABS: 0.1 10*3/uL (ref 0.0–0.5)
EOS%: 1.2 % (ref 0.0–7.0)
HCT: 36.4 % — ABNORMAL LOW (ref 38.4–49.9)
HEMOGLOBIN: 11.5 g/dL — AB (ref 13.0–17.1)
LYMPH%: 25.2 % (ref 14.0–49.0)
MCH: 30 pg (ref 27.2–33.4)
MCHC: 31.6 g/dL — AB (ref 32.0–36.0)
MCV: 95 fL (ref 79.3–98.0)
MONO#: 0.9 10*3/uL (ref 0.1–0.9)
MONO%: 12.4 % (ref 0.0–14.0)
NEUT#: 4.2 10*3/uL (ref 1.5–6.5)
NEUT%: 61.1 % (ref 39.0–75.0)
PLATELETS: 122 10*3/uL — AB (ref 140–400)
RBC: 3.83 10*6/uL — AB (ref 4.20–5.82)
RDW: 17.8 % — AB (ref 11.0–14.6)
WBC: 6.8 10*3/uL (ref 4.0–10.3)
lymph#: 1.7 10*3/uL (ref 0.9–3.3)

## 2015-01-04 MED ORDER — DEXTROSE 5 % IV SOLN
Freq: Once | INTRAVENOUS | Status: AC
  Administered 2015-01-04: 10:00:00 via INTRAVENOUS

## 2015-01-04 MED ORDER — FLUOROURACIL CHEMO INJECTION 5 GM/100ML
2200.0000 mg/m2 | INTRAVENOUS | Status: DC
  Administered 2015-01-04: 4650 mg via INTRAVENOUS
  Filled 2015-01-04: qty 93

## 2015-01-04 MED ORDER — HEPARIN SOD (PORK) LOCK FLUSH 100 UNIT/ML IV SOLN
500.0000 [IU] | Freq: Once | INTRAVENOUS | Status: DC | PRN
  Filled 2015-01-04: qty 5

## 2015-01-04 MED ORDER — DEXTROSE 5 % IV SOLN
77.0000 mg/m2 | Freq: Once | INTRAVENOUS | Status: AC
  Administered 2015-01-04: 160 mg via INTRAVENOUS
  Filled 2015-01-04: qty 32

## 2015-01-04 MED ORDER — SODIUM CHLORIDE 0.9 % IV SOLN
Freq: Once | INTRAVENOUS | Status: AC
  Administered 2015-01-04: 10:00:00 via INTRAVENOUS
  Filled 2015-01-04: qty 4

## 2015-01-04 MED ORDER — LEUCOVORIN CALCIUM INJECTION 350 MG
403.0000 mg/m2 | Freq: Once | INTRAVENOUS | Status: AC
  Administered 2015-01-04: 850 mg via INTRAVENOUS
  Filled 2015-01-04: qty 42.5

## 2015-01-04 MED ORDER — SODIUM CHLORIDE 0.9 % IJ SOLN
10.0000 mL | INTRAMUSCULAR | Status: DC | PRN
  Filled 2015-01-04: qty 10

## 2015-01-04 NOTE — Patient Instructions (Signed)
[facility] Discharge Instructions for Patients Receiving Chemotherapy  Today you received the following chemotherapy agents:  Oxaliplatin, leucovorin, 71fu home infusion  To help prevent nausea and vomiting after your treatment, we encourage you to take your nausea medication.  Take it as often as prescribed.     If you develop nausea and vomiting that is not controlled by your nausea medication, call the clinic. If it is after clinic hours your family physician or the after hours number for the clinic or go to the Emergency Department.   BELOW ARE SYMPTOMS THAT SHOULD BE REPORTED IMMEDIATELY:  *FEVER GREATER THAN 100.5 F  *CHILLS WITH OR WITHOUT FEVER  NAUSEA AND VOMITING THAT IS NOT CONTROLLED WITH YOUR NAUSEA MEDICATION  *UNUSUAL SHORTNESS OF BREATH  *UNUSUAL BRUISING OR BLEEDING  TENDERNESS IN MOUTH AND THROAT WITH OR WITHOUT PRESENCE OF ULCERS  *URINARY PROBLEMS  *BOWEL PROBLEMS  UNUSUAL RASH Items with * indicate a potential emergency and should be followed up as soon as possible.  Feel free to call the clinic you have any questions or concerns. The clinic phone number is [phone].   I have been informed and understand all the instructions given to me. I know to contact the clinic, my physician, or go to the Emergency Department if any problems should occur. I do not have any questions at this time, but understand that I may call the clinic during office hours   should I have any questions or need assistance in obtaining follow up care.    __________________________________________  _____________  __________ Signature of Patient or Authorized Representative            Date                   Time    __________________________________________ Nurse's Signature

## 2015-01-04 NOTE — Telephone Encounter (Signed)
Notified pt that letter is ready for p/u to excuse from jury duty.  He wants to p/u when he is here Sat for pump d/c.  Letter will be left in infusion room with Sat Schedule for nurse to give to him.

## 2015-01-06 ENCOUNTER — Ambulatory Visit: Payer: Medicare Other

## 2015-01-06 ENCOUNTER — Ambulatory Visit (HOSPITAL_BASED_OUTPATIENT_CLINIC_OR_DEPARTMENT_OTHER): Payer: Medicare Other

## 2015-01-06 VITALS — BP 129/62 | HR 64 | Temp 98.4°F | Resp 18

## 2015-01-06 DIAGNOSIS — Z5189 Encounter for other specified aftercare: Secondary | ICD-10-CM | POA: Diagnosis not present

## 2015-01-06 DIAGNOSIS — C179 Malignant neoplasm of small intestine, unspecified: Secondary | ICD-10-CM | POA: Diagnosis not present

## 2015-01-06 DIAGNOSIS — C772 Secondary and unspecified malignant neoplasm of intra-abdominal lymph nodes: Secondary | ICD-10-CM

## 2015-01-06 DIAGNOSIS — C787 Secondary malignant neoplasm of liver and intrahepatic bile duct: Secondary | ICD-10-CM | POA: Diagnosis not present

## 2015-01-06 MED ORDER — PEGFILGRASTIM INJECTION 6 MG/0.6ML ~~LOC~~
6.0000 mg | PREFILLED_SYRINGE | Freq: Once | SUBCUTANEOUS | Status: AC
  Administered 2015-01-06: 6 mg via SUBCUTANEOUS

## 2015-01-06 MED ORDER — HEPARIN SOD (PORK) LOCK FLUSH 100 UNIT/ML IV SOLN
500.0000 [IU] | Freq: Once | INTRAVENOUS | Status: AC | PRN
  Administered 2015-01-06: 500 [IU]
  Filled 2015-01-06: qty 5

## 2015-01-06 MED ORDER — SODIUM CHLORIDE 0.9 % IJ SOLN
10.0000 mL | INTRAMUSCULAR | Status: DC | PRN
  Administered 2015-01-06: 10 mL
  Filled 2015-01-06: qty 10

## 2015-01-06 NOTE — Progress Notes (Signed)
Injection documented on pump D/C appt

## 2015-01-11 ENCOUNTER — Telehealth: Payer: Self-pay | Admitting: Hematology

## 2015-01-11 NOTE — Telephone Encounter (Signed)
pt cld left vm in re to appt sch 8/15-cld & spoke to pt and pulled up of and 8/15 should have been CX-gave pt next appt time & dtae-pt understood

## 2015-01-15 ENCOUNTER — Ambulatory Visit: Payer: Medicare Other | Admitting: Hematology

## 2015-01-15 ENCOUNTER — Other Ambulatory Visit: Payer: Medicare Other

## 2015-01-15 ENCOUNTER — Ambulatory Visit: Payer: Medicare Other

## 2015-01-18 ENCOUNTER — Other Ambulatory Visit (HOSPITAL_BASED_OUTPATIENT_CLINIC_OR_DEPARTMENT_OTHER): Payer: Medicare Other

## 2015-01-18 ENCOUNTER — Telehealth: Payer: Self-pay | Admitting: Hematology

## 2015-01-18 ENCOUNTER — Encounter: Payer: Self-pay | Admitting: Hematology

## 2015-01-18 ENCOUNTER — Ambulatory Visit (HOSPITAL_BASED_OUTPATIENT_CLINIC_OR_DEPARTMENT_OTHER): Payer: Medicare Other

## 2015-01-18 ENCOUNTER — Ambulatory Visit (HOSPITAL_BASED_OUTPATIENT_CLINIC_OR_DEPARTMENT_OTHER): Payer: Medicare Other | Admitting: Hematology

## 2015-01-18 VITALS — BP 138/68 | HR 74 | Temp 98.1°F | Resp 18 | Ht 66.0 in | Wt 211.7 lb

## 2015-01-18 DIAGNOSIS — C787 Secondary malignant neoplasm of liver and intrahepatic bile duct: Secondary | ICD-10-CM

## 2015-01-18 DIAGNOSIS — C179 Malignant neoplasm of small intestine, unspecified: Secondary | ICD-10-CM

## 2015-01-18 DIAGNOSIS — D630 Anemia in neoplastic disease: Secondary | ICD-10-CM

## 2015-01-18 DIAGNOSIS — Z5111 Encounter for antineoplastic chemotherapy: Secondary | ICD-10-CM | POA: Diagnosis not present

## 2015-01-18 DIAGNOSIS — I10 Essential (primary) hypertension: Secondary | ICD-10-CM

## 2015-01-18 DIAGNOSIS — N183 Chronic kidney disease, stage 3 (moderate): Secondary | ICD-10-CM

## 2015-01-18 DIAGNOSIS — C772 Secondary and unspecified malignant neoplasm of intra-abdominal lymph nodes: Secondary | ICD-10-CM

## 2015-01-18 DIAGNOSIS — D696 Thrombocytopenia, unspecified: Secondary | ICD-10-CM

## 2015-01-18 DIAGNOSIS — R634 Abnormal weight loss: Secondary | ICD-10-CM

## 2015-01-18 DIAGNOSIS — E119 Type 2 diabetes mellitus without complications: Secondary | ICD-10-CM

## 2015-01-18 DIAGNOSIS — D500 Iron deficiency anemia secondary to blood loss (chronic): Secondary | ICD-10-CM

## 2015-01-18 DIAGNOSIS — R630 Anorexia: Secondary | ICD-10-CM

## 2015-01-18 LAB — COMPREHENSIVE METABOLIC PANEL (CC13)
ALT: 27 U/L (ref 0–55)
AST: 38 U/L — AB (ref 5–34)
Albumin: 2.8 g/dL — ABNORMAL LOW (ref 3.5–5.0)
Alkaline Phosphatase: 134 U/L (ref 40–150)
Anion Gap: 9 mEq/L (ref 3–11)
BUN: 8.6 mg/dL (ref 7.0–26.0)
CHLORIDE: 108 meq/L (ref 98–109)
CO2: 24 meq/L (ref 22–29)
Calcium: 9 mg/dL (ref 8.4–10.4)
Creatinine: 0.9 mg/dL (ref 0.7–1.3)
EGFR: 82 mL/min/{1.73_m2} — AB (ref >90)
GLUCOSE: 121 mg/dL (ref 70–140)
POTASSIUM: 3.8 meq/L (ref 3.5–5.1)
SODIUM: 141 meq/L (ref 136–145)
Total Bilirubin: 0.4 mg/dL (ref 0.20–1.20)
Total Protein: 6.4 g/dL (ref 6.4–8.3)

## 2015-01-18 LAB — CBC & DIFF AND RETIC
BASO%: 0.1 % (ref 0.0–2.0)
Basophils Absolute: 0 10*3/uL (ref 0.0–0.1)
EOS ABS: 0.1 10*3/uL (ref 0.0–0.5)
EOS%: 0.9 % (ref 0.0–7.0)
HCT: 34.6 % — ABNORMAL LOW (ref 38.4–49.9)
HEMOGLOBIN: 10.9 g/dL — AB (ref 13.0–17.1)
Immature Retic Fract: 21 % — ABNORMAL HIGH (ref 3.00–10.60)
LYMPH%: 20.5 % (ref 14.0–49.0)
MCH: 29.8 pg (ref 27.2–33.4)
MCHC: 31.5 g/dL — AB (ref 32.0–36.0)
MCV: 94.5 fL (ref 79.3–98.0)
MONO#: 0.6 10*3/uL (ref 0.1–0.9)
MONO%: 7.4 % (ref 0.0–14.0)
NEUT%: 71.1 % (ref 39.0–75.0)
NEUTROS ABS: 5.5 10*3/uL (ref 1.5–6.5)
Platelets: 109 10*3/uL — ABNORMAL LOW (ref 140–400)
RBC: 3.66 10*6/uL — AB (ref 4.20–5.82)
RDW: 16.3 % — AB (ref 11.0–14.6)
RETIC %: 1.65 % (ref 0.80–1.80)
Retic Ct Abs: 60.39 10*3/uL (ref 34.80–93.90)
WBC: 7.7 10*3/uL (ref 4.0–10.3)
lymph#: 1.6 10*3/uL (ref 0.9–3.3)

## 2015-01-18 MED ORDER — FLUOROURACIL CHEMO INJECTION 5 GM/100ML
2200.0000 mg/m2 | INTRAVENOUS | Status: DC
  Administered 2015-01-18: 4650 mg via INTRAVENOUS
  Filled 2015-01-18: qty 93

## 2015-01-18 MED ORDER — OXALIPLATIN CHEMO INJECTION 100 MG/20ML
77.0000 mg/m2 | Freq: Once | INTRAVENOUS | Status: AC
  Administered 2015-01-18: 160 mg via INTRAVENOUS
  Filled 2015-01-18: qty 32

## 2015-01-18 MED ORDER — HEPARIN SOD (PORK) LOCK FLUSH 100 UNIT/ML IV SOLN
500.0000 [IU] | Freq: Once | INTRAVENOUS | Status: DC | PRN
  Filled 2015-01-18: qty 5

## 2015-01-18 MED ORDER — LEUCOVORIN CALCIUM INJECTION 350 MG
850.0000 mg | Freq: Once | INTRAVENOUS | Status: AC
  Administered 2015-01-18: 850 mg via INTRAVENOUS
  Filled 2015-01-18: qty 42.5

## 2015-01-18 MED ORDER — SODIUM CHLORIDE 0.9 % IJ SOLN
10.0000 mL | INTRAMUSCULAR | Status: DC | PRN
  Filled 2015-01-18: qty 10

## 2015-01-18 MED ORDER — DEXTROSE 5 % IV SOLN
Freq: Once | INTRAVENOUS | Status: AC
  Administered 2015-01-18: 10:00:00 via INTRAVENOUS

## 2015-01-18 MED ORDER — SODIUM CHLORIDE 0.9 % IV SOLN
Freq: Once | INTRAVENOUS | Status: AC
  Administered 2015-01-18: 11:00:00 via INTRAVENOUS
  Filled 2015-01-18: qty 4

## 2015-01-18 NOTE — Telephone Encounter (Signed)
Gave adn printed appt sched and avs for pt for Aug adn SEpt

## 2015-01-18 NOTE — Progress Notes (Addendum)
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    08/18/2014 Miscellaneous tumor KRAS mutation (-)   08/18/2014 Pathology Results Liver biopsy showed metastatic adenocarcinoma, consistent with GI primary   08/18/2014 Initial Diagnosis Small bowel cancer   08/18/2014 Procedure small bowel enteroscopy with biopsy by Dr. Benson Norway showed at the proximal jejunum there was evidence of abnormal mucosa and friability, biopsy was obtained.    09/05/2014 Imaging PET hypermetabolic mass involving a small bowel loop with adjacent mesenteric lymphadenopathy, and diffuse liver metastasis and mild Pallor metabolic lymphadenopathy in porta hepatis.   09/06/2014 -  Chemotherapy mFOLFOX    09/07/2014 - 09/11/2014 Hospital Admission He was admitted for fever and GI bleeding. Received 3 units of RBC.   10/26/2014 Imaging Interval significant improvement in the primary small bowel mass, adjacent lymphadenopathy and extensive hepatic metastatic disease. No other new lesions.      HISTORY OF PRESENTING ILLNESS:  Tyler Evans 69 y.o. male is here because of a recent diagnosis of small bowel cancer. He presented to his PCP on 08/17/2014 with complaints of feeling weak and a one month history of a cough. He was found to be anemic and was referred to the emergency department. Hemoglobin was found to be 7.6, MCV 76.6; creatinine was elevated at 1.74. Stool was Hemoccult positive.  CT abdomen/pelvis without contrast  on 08/17/2014 showed multiple large masses within the liver, bulky in appearance. The largest measured approximately 5.7 cm. There appeared to be a focal filling defect within the proximal jejunum measuring approximately 2.5 x 1.7 cm. There was mild adjacent jejunal wall thickening. The lung bases were clear.  On 08/18/2014 he underwent a small bowel enteroscopy with biopsy by Dr. Benson Norway. The esophagus and gastric lumen were normal. At the proximal jejunum there was evidence of abnormal mucosa and friability. The pediatric colonoscope was not able to traverse the area. Biopsies were obtained. An ultraslim colonoscope was then utilized. This colonoscope was able to traverse the area of stenosis which measured approximately 3 cm in length. 50% of the lumen was ulcerated. 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 MMR stains are pending.  He was discharged home on 08/19/2014.  INTERIM HISTORY:  Tyler Evans returns for follow-up and chemotherapy. He is tolerating his treatment very well. He has some sleepiness after the Neulasta injection 2 weeks ago, but no bone pain. He also has mild sensitivity and tingling on his palms, fingers and toes, no issues with hand and finger function or walking. He has good appetite and eating well. Denies any pain, nausea, or other symptoms. His bowel movement is normal, no melena or hematochezia. He gained a few pounds lately.  MEDICAL HISTORY:  Past Medical History  Diagnosis Date  . Diabetes mellitus without complication   . Hypertension   . Small bowel cancer 08/18/2014    SURGICAL HISTORY: Past Surgical History  Procedure Laterality Date  . Enteroscopy N/A 08/18/2014    Procedure:  ENTEROSCOPY;  Surgeon: Carol Ada, MD;  Location: [facility];  Service: Endoscopy;  Laterality: N/A;    SOCIAL HISTORY: History   Social History  . Marital Status: Married    Spouse Name: N/A  . Number of Children: 2  . Years of  Education: N/A   Occupational History  .  retired Ambulance person    Social History Main Topics  . Smoking status: Never Smoker   . Smokeless tobacco: Not on file  . Alcohol Use: No  . Drug Use: Not on file  . Sexual Activity: Not on file   Other Topics Concern  . Not on file   Social History Narrative  He lives in [place]. He is married. He has 2 children both reported to be in good health. No tobacco or alcohol use. He is a retired Ambulance person.  FAMILY HISTORY: Family History  Problem Relation Age of Onset  . Heart failure, Alzheimer's  Mother   . Lung cancer Father   . Kidney cancer Brother     ALLERGIES:  is allergic to penicillins.  MEDICATIONS:  Current Outpatient Prescriptions  Medication Sig Dispense Refill  . atorvastatin (LIPITOR) 20 MG tablet Take 20 mg by mouth daily.    Marland Kitchen glipiZIDE (GLUCOTROL) 10 MG tablet Take 10 mg by mouth 2 (two) times daily before a meal.    . insulin glargine (LANTUS) 100 UNIT/ML injection Inject 40 Units into the skin at bedtime.    . pantoprazole (PROTONIX) 40 MG tablet Take 1 tablet (40 mg total) by mouth daily. 30 tablet 0  . chlorthalidone (HYGROTON) 25 MG tablet Take 25 mg by mouth daily.    Marland Kitchen lisinopril (PRINIVIL,ZESTRIL) 40 MG tablet Take 40 mg by mouth at bedtime.    . metFORMIN (GLUCOPHAGE) 1000 MG tablet Take 1,000 mg by mouth 2 (two) times daily with a meal.         REVIEW OF SYSTEMS:   Constitutional: Denies fevers, chills or abnormal night sweats; mild fatigue, weight is stable.  Eyes: Denies blurriness of vision, double vision or watery eyes Ears, nose, mouth, throat, and face: Denies mucositis or sore throat Respiratory: One month history of a nonproductive cough;  no shortness of breath Cardiovascular: Denies palpitation, chest discomfort or lower extremity swelling Gastrointestinal:  Denies nausea, heartburn. No dysphagia. He has noted less frequent bowel movements over the past 2 weeks. Does not characterize  this as constipation. Skin: Denies abnormal skin rashes Lymphatics: Denies new lymphadenopathy or easy bruising Neurological: Denies numbness, tingling. No extremity weakness Behavioral/Psych: Mood is stable, no new changes  All other systems were reviewed with the patient and are negative.  PHYSICAL EXAMINATION: ECOG PERFORMANCE STATUS: 1 - Symptomatic but completely ambulatory  Filed Vitals:   01/18/15 0836  BP: 138/68  Pulse: 74  Temp: 98.1 F (36.7 C)  Resp: 18   Filed Weights   01/18/15 0836  Weight: 211 lb 11.2 oz (96.026 kg)    GENERAL:alert, no distress and comfortable SKIN: skin color, texture, turgor are normal, no rashes or significant lesions EYES: normal, conjunctiva are pink and non-injected, sclera clear OROPHARYNX:no exudate, no erythema and lips, buccal mucosa, and tongue normal  NECK: supple, thyroid normal size, non-tender, without nodularity LYMPH:  no palpable lymphadenopathy in the cervical, axillary or inguinal regions LUNGS: clear to auscultation and percussion with normal breathing effort HEART: regular rate & rhythm and no murmurs and no lower extremity edema ABDOMEN:abdomen soft, mild tenderness at the right upper quadrant , no rebound pain,  normal bowel sounds Musculoskeletal:no cyanosis of digits and no clubbing  PSYCH: alert & oriented x 3 with fluent speech NEURO: no focal motor/sensory deficits  LABORATORY DATA:  I have reviewed the data as listed CBC Latest Ref Rng 01/18/2015 01/04/2015 01/01/2015  WBC 4.0 - 10.3 10e3/uL 7.7 6.8 10.5(H)  Hemoglobin 13.0 - 17.1 g/dL 10.9(L) 11.5(L) 11.9(L)  Hematocrit 38.4 - 49.9 % 34.6(L) 36.4(L) 36.9(L)  Platelets 140 - 400 10e3/uL 109(L) 122(L) 88(L)    CMP Latest Ref Rng 01/18/2015 01/01/2015 12/18/2014  Glucose 70 - 140 mg/dl 121 64(L) 151(H)  BUN 7.0 - 26.0 mg/dL 8.6 8.8 14.1  Creatinine 0.7 - 1.3 mg/dL 0.9 1.0 1.1  Sodium 136 - 145 mEq/L 141 141 139  Potassium 3.5 - 5.1 mEq/L 3.8 3.4(L) 4.0  Chloride 96  - 112 mmol/L - - -  CO2 22 - 29 mEq/L $Remove'24 26 24  'QgXbMAN$ Calcium 8.4 - 10.4 mg/dL 9.0 9.2 9.3  Total Protein 6.4 - 8.3 g/dL 6.4 7.1 6.9  Total Bilirubin 0.20 - 1.20 mg/dL 0.40 0.36 0.32  Alkaline Phos 40 - 150 U/L 134 153(H) 112  AST 5 - 34 U/L 38(H) 36(H) 31  ALT 0 - 55 U/L $Remo'27 24 18    'tmTHu$ Pathology report:  Small Intestine Biopsy, jejunal mass 08/18/2014 - INVASIVE ADENOCARCINOMA IN A BACKGROUND OF TUBULOVILLOUS ADENOMA. ADDITIONAL INFORMATION: Mismatch Repair (MMR) Protein Immunohistochemistry (IHC) IHC Expression Result (LIMITED TUMOR): MLH1: Preserved nuclear expression (greater 50% tumor expression) MSH2: Preserved nuclear expression (greater 50% tumor expression) MSH6: Preserved nuclear expression (greater 50% tumor expression) PMS2: Preserved nuclear expression (greater 50% tumor expression) * Internal control demonstrates intact nuclear expression Interpretation: NORMAL There is preserved expression  Diagnosis 08/28/2014  Liver, needle/core biopsy, right lobe - METASTATIC ADENOCARCINOMA   RADIOGRAPHIC STUDIES: I have personally reviewed the radiological images as listed and agreed with the findings in the report.  Nm Pet Image Initial (pi) Skull Base To Thigh 09/05/2014    IMPRESSION: Hypermetabolic mass involving a small bowel loop with adjacent mesenteric lymphadenopathy in the left abdomen, consistent with known primary small bowel carcinoma. No evidence of small bowel obstruction.  Diffuse liver metastases. Mild hypermetabolic lymphadenopathy in porta hepatis, also suspicious for metastatic disease.  No evidence of metastatic disease within the neck, chest, or pelvis.  Incidental findings including cholelithiasis, diverticulosis, and mildly enlarged prostate.   Electronically Signed   By: Earle Gell M.D.   On: 09/05/2014 17:29   PET 10/26/2014  IMPRESSION: 1. Interval significant improvement in the primary small bowel mass, adjacent lymphadenopathy and extensive hepatic metastatic  disease. 2. No disease progression identified. 3. No significant findings in the neck or chest. Small pulmonary nodules bilaterally are unchanged.   ASSESSMENT & PLAN:  69 year old gentleman with past medical history of diabetes, hypertension, who presents with anemia and weakness.  1.Small bowel cancer metastatic to the liver and abdominal nodes, MMR normal, KRAS/NRAS wild type -I reviewed his imaging findings, jejunum biopsy and liver biopsy results with patient and his wife extensively.  -Unfortunately he has stage IV disease with diffuse liver metastasis, this is an incurable disease, with overall very poor prognosis. -I recommend systemic chemotherapy with FOLFOX, with a goal of palliation and prolonged his life. -I discussed his restaging PET scan results with patient and his wife. He has had grade partial response, with significant interval improvement in the primary small bowel mass and liver metastasis. No other new lesions. -He is clinically doing very well, CEA continue trending down, responding to treatment  well. -He is tolerating FOLFOX very well, we'll continue -given the negative KRAS/NRAS GENE MUTATION, I would consider EGFR targeted therapy, with second line chemo.  -Lab reviewed, he has mild thrombocytopenia, we'll postpone his chemotherapy to next Monday, with slight dose reduction. -We discussed that he may need surgery or radiation if he has recurrent severe GI bleeding. He agrees. Hopefully the chemotherapy will control his cancer, and prevent further bleeding episodes. -Lab reviewed, mild some cytopenia, we'll proceed with cycle 9 FOLFOX today.  2. Microcytic anemia secondary to GI bleeding and iron deficiency -His serum iron level and saturation were low, although ferritin is normal, he has some degree of iron deficient anemia, and anemia of malignancy  -Consider blood transfusion if hemoglobin less than 8 or symptomatic anemia -he received IV feraheme 510 mg twice in  09/2014 -His hemoglobin is 10.9, stable .  3. anorexia/weight loss secondary to #1. -Much improved, he has gained some weight back lately    4. Hypertension and DM  -He will continue follow-up with his primary care physician. -We discussed that we will monitor his blood pressure and blood sugar closely and may need to adjust his medication during the chemotherapy   5. CKD, stage III -He is currently stable at 1.1, stable  -I encouraged him to drink adequately and avoid dehydration.   Plan: -proceed cycle 9 chemo today -He will see APP Lattie Haw in 2 weeks, and me in 4 weeks -Restaging PET scan in 3-4 weeks   Truitt Merle  01/18/2015

## 2015-01-20 ENCOUNTER — Ambulatory Visit: Payer: Medicare Other

## 2015-01-20 ENCOUNTER — Ambulatory Visit (HOSPITAL_BASED_OUTPATIENT_CLINIC_OR_DEPARTMENT_OTHER): Payer: Medicare Other

## 2015-01-20 VITALS — BP 121/65 | HR 69 | Temp 98.4°F | Resp 18

## 2015-01-20 DIAGNOSIS — Z5189 Encounter for other specified aftercare: Secondary | ICD-10-CM | POA: Diagnosis not present

## 2015-01-20 DIAGNOSIS — C179 Malignant neoplasm of small intestine, unspecified: Secondary | ICD-10-CM

## 2015-01-20 DIAGNOSIS — C772 Secondary and unspecified malignant neoplasm of intra-abdominal lymph nodes: Secondary | ICD-10-CM

## 2015-01-20 DIAGNOSIS — C787 Secondary malignant neoplasm of liver and intrahepatic bile duct: Secondary | ICD-10-CM

## 2015-01-20 MED ORDER — SODIUM CHLORIDE 0.9 % IJ SOLN
10.0000 mL | INTRAMUSCULAR | Status: DC | PRN
  Administered 2015-01-20: 10 mL
  Filled 2015-01-20: qty 10

## 2015-01-20 MED ORDER — HEPARIN SOD (PORK) LOCK FLUSH 100 UNIT/ML IV SOLN
500.0000 [IU] | Freq: Once | INTRAVENOUS | Status: AC | PRN
  Administered 2015-01-20: 500 [IU]
  Filled 2015-01-20: qty 5

## 2015-01-20 MED ORDER — PEGFILGRASTIM INJECTION 6 MG/0.6ML ~~LOC~~
6.0000 mg | PREFILLED_SYRINGE | Freq: Once | SUBCUTANEOUS | Status: AC
  Administered 2015-01-20: 6 mg via SUBCUTANEOUS

## 2015-01-20 NOTE — Patient Instructions (Signed)
Pegfilgrastim injection What is this medicine? PEGFILGRASTIM (peg fil GRA stim) is a long-acting granulocyte colony-stimulating factor that stimulates the growth of neutrophils, a type of white blood cell important in the body's fight against infection. It is used to reduce the incidence of fever and infection in patients with certain types of cancer who are receiving chemotherapy that affects the bone marrow. This medicine may be used for other purposes; ask your health care provider or pharmacist if you have questions. COMMON BRAND NAME(S): Neulasta What should I tell my health care provider before I take this medicine? They need to know if you have any of these conditions: -latex allergy -ongoing radiation therapy -sickle cell disease -skin reactions to acrylic adhesives (On-Body Injector only) -an unusual or allergic reaction to pegfilgrastim, filgrastim, other medicines, foods, dyes, or preservatives -pregnant or trying to get pregnant -breast-feeding How should I use this medicine? This medicine is for injection under the skin. If you get this medicine at home, you will be taught how to prepare and give the pre-filled syringe or how to use the On-body Injector. Refer to the patient Instructions for Use for detailed instructions. Use exactly as directed. Take your medicine at regular intervals. Do not take your medicine more often than directed. It is important that you put your used needles and syringes in a special sharps container. Do not put them in a trash can. If you do not have a sharps container, call your pharmacist or healthcare provider to get one. Talk to your pediatrician regarding the use of this medicine in children. Special care may be needed. Overdosage: If you think you have taken too much of this medicine contact a poison control center or emergency room at once. NOTE: This medicine is only for you. Do not share this medicine with others. What if I miss a dose? It is  important not to miss your dose. Call your doctor or health care professional if you miss your dose. If you miss a dose due to an On-body Injector failure or leakage, a new dose should be administered as soon as possible using a single prefilled syringe for manual use. What may interact with this medicine? Interactions have not been studied. Give your health care provider a list of all the medicines, herbs, non-prescription drugs, or dietary supplements you use. Also tell them if you smoke, drink alcohol, or use illegal drugs. Some items may interact with your medicine. This list may not describe all possible interactions. Give your health care provider a list of all the medicines, herbs, non-prescription drugs, or dietary supplements you use. Also tell them if you smoke, drink alcohol, or use illegal drugs. Some items may interact with your medicine. What should I watch for while using this medicine? You may need blood work done while you are taking this medicine. If you are going to need a MRI, CT scan, or other procedure, tell your doctor that you are using this medicine (On-Body Injector only). What side effects may I notice from receiving this medicine? Side effects that you should report to your doctor or health care professional as soon as possible: -allergic reactions like skin rash, itching or hives, swelling of the face, lips, or tongue -dizziness -fever -pain, redness, or irritation at site where injected -pinpoint red spots on the skin -shortness of breath or breathing problems -stomach or side pain, or pain at the shoulder -swelling -tiredness -trouble passing urine Side effects that usually do not require medical attention (report to your doctor   or health care professional if they continue or are bothersome): -bone pain -muscle pain This list may not describe all possible side effects. Call your doctor for medical advice about side effects. You may report side effects to FDA at  1-800-FDA-1088. Where should I keep my medicine? Keep out of the reach of children. Store pre-filled syringes in a refrigerator between 2 and 8 degrees C (36 and 46 degrees F). Do not freeze. Keep in carton to protect from light. Throw away this medicine if it is left out of the refrigerator for more than 48 hours. Throw away any unused medicine after the expiration date. NOTE: This sheet is a summary. It may not cover all possible information. If you have questions about this medicine, talk to your doctor, pharmacist, or health care provider.  2015, Elsevier/Gold Standard. (2013-08-18 16:14:05)  

## 2015-02-01 ENCOUNTER — Other Ambulatory Visit (HOSPITAL_BASED_OUTPATIENT_CLINIC_OR_DEPARTMENT_OTHER): Payer: Medicare Other

## 2015-02-01 ENCOUNTER — Ambulatory Visit (HOSPITAL_BASED_OUTPATIENT_CLINIC_OR_DEPARTMENT_OTHER): Payer: Medicare Other | Admitting: Nurse Practitioner

## 2015-02-01 ENCOUNTER — Ambulatory Visit (HOSPITAL_BASED_OUTPATIENT_CLINIC_OR_DEPARTMENT_OTHER): Payer: Medicare Other

## 2015-02-01 VITALS — BP 130/64 | HR 74 | Temp 98.3°F | Resp 18 | Ht 66.0 in | Wt 211.1 lb

## 2015-02-01 DIAGNOSIS — C787 Secondary malignant neoplasm of liver and intrahepatic bile duct: Secondary | ICD-10-CM

## 2015-02-01 DIAGNOSIS — C179 Malignant neoplasm of small intestine, unspecified: Secondary | ICD-10-CM

## 2015-02-01 DIAGNOSIS — C772 Secondary and unspecified malignant neoplasm of intra-abdominal lymph nodes: Secondary | ICD-10-CM

## 2015-02-01 DIAGNOSIS — Z5111 Encounter for antineoplastic chemotherapy: Secondary | ICD-10-CM | POA: Diagnosis not present

## 2015-02-01 LAB — COMPREHENSIVE METABOLIC PANEL (CC13)
ALT: 20 U/L (ref 0–55)
AST: 33 U/L (ref 5–34)
Albumin: 3 g/dL — ABNORMAL LOW (ref 3.5–5.0)
Alkaline Phosphatase: 148 U/L (ref 40–150)
Anion Gap: 8 mEq/L (ref 3–11)
BUN: 10.9 mg/dL (ref 7.0–26.0)
CALCIUM: 9.1 mg/dL (ref 8.4–10.4)
CHLORIDE: 111 meq/L — AB (ref 98–109)
CO2: 24 mEq/L (ref 22–29)
CREATININE: 1 mg/dL (ref 0.7–1.3)
EGFR: 75 mL/min/{1.73_m2} — ABNORMAL LOW (ref >90)
Glucose: 122 mg/dl (ref 70–140)
Potassium: 4.1 mEq/L (ref 3.5–5.1)
Sodium: 142 mEq/L (ref 136–145)
Total Bilirubin: 0.45 mg/dL (ref 0.20–1.20)
Total Protein: 6.7 g/dL (ref 6.4–8.3)

## 2015-02-01 LAB — CBC & DIFF AND RETIC
BASO%: 0.3 % (ref 0.0–2.0)
BASOS ABS: 0 10*3/uL (ref 0.0–0.1)
EOS ABS: 0.1 10*3/uL (ref 0.0–0.5)
EOS%: 1.4 % (ref 0.0–7.0)
HEMATOCRIT: 34.5 % — AB (ref 38.4–49.9)
HEMOGLOBIN: 10.9 g/dL — AB (ref 13.0–17.1)
Immature Retic Fract: 25.2 % — ABNORMAL HIGH (ref 3.00–10.60)
LYMPH#: 1.8 10*3/uL (ref 0.9–3.3)
LYMPH%: 25 % (ref 14.0–49.0)
MCH: 30.2 pg (ref 27.2–33.4)
MCHC: 31.6 g/dL — AB (ref 32.0–36.0)
MCV: 95.6 fL (ref 79.3–98.0)
MONO#: 0.9 10*3/uL (ref 0.1–0.9)
MONO%: 12.9 % (ref 0.0–14.0)
NEUT#: 4.3 10*3/uL (ref 1.5–6.5)
NEUT%: 60.4 % (ref 39.0–75.0)
Platelets: 111 10*3/uL — ABNORMAL LOW (ref 140–400)
RBC: 3.61 10*6/uL — ABNORMAL LOW (ref 4.20–5.82)
RDW: 16.5 % — AB (ref 11.0–14.6)
RETIC %: 2.18 % — AB (ref 0.80–1.80)
RETIC CT ABS: 78.7 10*3/uL (ref 34.80–93.90)
WBC: 7.1 10*3/uL (ref 4.0–10.3)

## 2015-02-01 LAB — IRON AND TIBC CHCC
%SAT: 39 % (ref 20–55)
IRON: 115 ug/dL (ref 42–163)
TIBC: 299 ug/dL (ref 202–409)
UIBC: 183 ug/dL (ref 117–376)

## 2015-02-01 LAB — FERRITIN CHCC: FERRITIN: 676 ng/mL — AB (ref 22–316)

## 2015-02-01 MED ORDER — SODIUM CHLORIDE 0.9 % IV SOLN
Freq: Once | INTRAVENOUS | Status: AC
  Administered 2015-02-01: 12:00:00 via INTRAVENOUS
  Filled 2015-02-01: qty 4

## 2015-02-01 MED ORDER — DEXTROSE 5 % IV SOLN
Freq: Once | INTRAVENOUS | Status: AC
  Administered 2015-02-01: 12:00:00 via INTRAVENOUS

## 2015-02-01 MED ORDER — LEUCOVORIN CALCIUM INJECTION 350 MG
403.0000 mg/m2 | Freq: Once | INTRAVENOUS | Status: AC
  Administered 2015-02-01: 850 mg via INTRAVENOUS
  Filled 2015-02-01: qty 42.5

## 2015-02-01 MED ORDER — OXALIPLATIN CHEMO INJECTION 100 MG/20ML
60.0000 mg/m2 | Freq: Once | INTRAVENOUS | Status: AC
  Administered 2015-02-01: 125 mg via INTRAVENOUS
  Filled 2015-02-01: qty 25

## 2015-02-01 MED ORDER — SODIUM CHLORIDE 0.9 % IV SOLN
2200.0000 mg/m2 | INTRAVENOUS | Status: DC
  Administered 2015-02-01: 4650 mg via INTRAVENOUS
  Filled 2015-02-01: qty 93

## 2015-02-01 NOTE — Patient Instructions (Signed)
[facility] Discharge Instructions for Patients Receiving Chemotherapy  Today you received the following chemotherapy agents:  Oxaliplatin, Leucovorin, Fluorouracil  To help prevent nausea and vomiting after your treatment, we encourage you to take your nausea medication as prescribed.   If you develop nausea and vomiting that is not controlled by your nausea medication, call the clinic.   BELOW ARE SYMPTOMS THAT SHOULD BE REPORTED IMMEDIATELY:  *FEVER GREATER THAN 100.5 F  *CHILLS WITH OR WITHOUT FEVER  NAUSEA AND VOMITING THAT IS NOT CONTROLLED WITH YOUR NAUSEA MEDICATION  *UNUSUAL SHORTNESS OF BREATH  *UNUSUAL BRUISING OR BLEEDING  TENDERNESS IN MOUTH AND THROAT WITH OR WITHOUT PRESENCE OF ULCERS  *URINARY PROBLEMS  *BOWEL PROBLEMS  UNUSUAL RASH Items with * indicate a potential emergency and should be followed up as soon as possible.  Feel free to call the clinic you have any questions or concerns. The clinic phone number is [phone].  Please show the CHEMO ALERT CARD at check-in to the Emergency Department and triage nurse.   

## 2015-02-01 NOTE — Progress Notes (Signed)
[place] OFFICE PROGRESS NOTE   Diagnosis: Small bowel cancer metastatic to liver  Oncology History   Small bowel cancer  Staging form: Small Intestine, AJCC 7th Edition  Clinical: Stage IV (TX, N1, M1) - Unsigned       Small bowel cancer   08/17/2014 Imaging CT abdomen/pelvis without contrast showed multiple large masses within the liver and 2.5cm mass within the proximal jejunum.    08/18/2014 Pathology Results Liver biopsy showed metastatic adenocarcinoma, consistent with GI primary   08/18/2014 Initial Diagnosis Small bowel cancer   08/18/2014 Procedure small bowel enteroscopy with biopsy by Dr. Benson Norway showed at the proximal jejunum there was evidence of abnormal mucosa and friability, biopsy was obtained.    09/05/2014 Imaging PET hypermetabolic mass involving a small bowel loop with adjacent mesenteric lymphadenopathy, and diffuse liver metastasis and mild metabolic lymphadenopathy in porta hepatis.   09/06/2014 -  Chemotherapy mFOLFOX            09/07/2014 - 09/11/2014 Hospital Admission He was admitted for fever and GI bleeding. Received 3 units of RBC.    10/26/2014 Imaging Interval significant improvement in the primary small bowel mass, adjacent lymphadenopathy and extensive hepatic metastatic disease. No other new lesions.                    INTERVAL HISTORY:   Tyler Evans returns as scheduled. He completed cycle 9 FOLFOX on 01/18/2015. He denies nausea/vomiting. No mouth sores. No diarrhea. He notes persistent numbness in the fingertips and across the bottom of both feet. He is having difficulty buttoning his shirt. No associated pain. He reports a good appetite.  Objective:  Vital signs in last 24 hours:  Blood pressure 130/64, pulse 74, temperature 98.3 F (36.8 C), temperature source Oral, resp. rate 18,  height 5\' 6"  (1.676 m), weight 211 lb 1.6 oz (95.754 kg), SpO2 100 %.    HEENT: No thrush or ulcers. Resp: Lungs clear bilaterally. Cardio: Regular rate and rhythm. GI: Abdomen soft and nontender. No hepatomegaly. Vascular: No leg edema. Calves soft and nontender. Neuro: Vibratory sense mildly decreased over the fingertips per tuning fork exam.  Port-A-Cath without erythema.    Lab Results:  Lab Results  Component Value Date   WBC 7.1 02/01/2015   HGB 10.9* 02/01/2015   HCT 34.5* 02/01/2015   MCV 95.6 02/01/2015   PLT 111* 02/01/2015   NEUTROABS 4.3 02/01/2015    Imaging:  No results found.  Medications: I have reviewed the patient's current medications.  Assessment/Plan: 1. 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  CT abdomen/pelvis without contrast 08/17/2014 with multiple large masses within the liver, bulky in appearance. The largest measured approximately 5.7 cm. Focal filling defect within the proximal jejunum measuring approximately 2.5 x 1.7 cm. Mild adjacent jejunal wall thickening.  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   Cycle 1 FOLFOX 09/06/2014  Cycle 2 FOLFOX 09/20/2014  Cycle 3 FOLFOX 10/04/2014  Cycle 4 FOLFOX 10/18/2014  Restaging PET scan 10/26/2014 with interval significant improvement in the primary small bowel mass, adjacent lymphadenopathy and extensive hepatic metastatic disease. Small pulmonary nodules bilaterally unchanged. No evidence of disease progression.  Cycle 5 FOLFOX 11/08/2014  Cycle 6 FOLFOX 11/29/2014  Cycle 7 FOLFOX 12/18/2014  Cycle 8 FOLFOX 01/04/2015 (oxaliplatin dose reduced due to thrombocytopenia)  Cycle 9 FOLFOX 01/18/2015  Cycle 10 FOLFOX 02/01/2015 (oxaliplatin dose  reduced due to neuropathy) 2. Microcytic anemia in the setting of  heme-positive stool status post red cell transfusion support 08/17/2014. 3. Anorexia/weight loss secondary to #1. 4. Hypertension. 5. Diabetes. 6. CKD stage III 7. Hospitalization 09/07/2014 through 09/11/2014 with fever/sepsis, culture negative. 8. Thrombocytopenia secondary to chemotherapy.   Disposition: [name] has completed 9 cycles of FOLFOX. Plan to proceed with cycle 10 today as scheduled.   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 I discussed the neuropathy with Dr. Burr Medico. The oxaliplatin will be further dose reduced beginning today. We will hold Neulasta with this cycle due to the decreased oxaliplatin dose.  He is scheduled for a restaging PET scan on 02/09/2015. He will return for a follow-up visit on 02/15/2015. He will contact the office in the interim with any problems.    Ned Card ANP/GNP-BC   02/01/2015  10:39 AM

## 2015-02-02 LAB — CEA: CEA: 5.5 ng/mL — ABNORMAL HIGH (ref 0.0–5.0)

## 2015-02-03 ENCOUNTER — Ambulatory Visit (HOSPITAL_BASED_OUTPATIENT_CLINIC_OR_DEPARTMENT_OTHER): Payer: Medicare Other

## 2015-02-03 ENCOUNTER — Ambulatory Visit: Payer: Medicare Other

## 2015-02-03 VITALS — BP 125/57 | HR 67 | Temp 98.6°F | Resp 18

## 2015-02-03 DIAGNOSIS — C772 Secondary and unspecified malignant neoplasm of intra-abdominal lymph nodes: Secondary | ICD-10-CM

## 2015-02-03 DIAGNOSIS — C179 Malignant neoplasm of small intestine, unspecified: Secondary | ICD-10-CM

## 2015-02-03 DIAGNOSIS — C787 Secondary malignant neoplasm of liver and intrahepatic bile duct: Secondary | ICD-10-CM

## 2015-02-03 MED ORDER — SODIUM CHLORIDE 0.9 % IJ SOLN
10.0000 mL | INTRAMUSCULAR | Status: DC | PRN
  Administered 2015-02-03: 10 mL
  Filled 2015-02-03: qty 10

## 2015-02-03 MED ORDER — HEPARIN SOD (PORK) LOCK FLUSH 100 UNIT/ML IV SOLN
500.0000 [IU] | Freq: Once | INTRAVENOUS | Status: AC | PRN
  Administered 2015-02-03: 500 [IU]
  Filled 2015-02-03: qty 5

## 2015-02-03 NOTE — Patient Instructions (Signed)

## 2015-02-09 ENCOUNTER — Ambulatory Visit (HOSPITAL_COMMUNITY)
Admission: RE | Admit: 2015-02-09 | Discharge: 2015-02-09 | Disposition: A | Discharge status: Home / Self Care | Payer: Medicare Other | Source: Ambulatory Visit | Attending: Hematology | Admitting: Hematology

## 2015-02-09 DIAGNOSIS — J341 Cyst and mucocele of nose and nasal sinus: Secondary | ICD-10-CM | POA: Insufficient documentation

## 2015-02-09 DIAGNOSIS — R918 Other nonspecific abnormal finding of lung field: Secondary | ICD-10-CM | POA: Diagnosis not present

## 2015-02-09 DIAGNOSIS — Z9221 Personal history of antineoplastic chemotherapy: Secondary | ICD-10-CM | POA: Insufficient documentation

## 2015-02-09 DIAGNOSIS — C179 Malignant neoplasm of small intestine, unspecified: Secondary | ICD-10-CM

## 2015-02-09 DIAGNOSIS — K8020 Calculus of gallbladder without cholecystitis without obstruction: Secondary | ICD-10-CM | POA: Insufficient documentation

## 2015-02-09 DIAGNOSIS — I2510 Atherosclerotic heart disease of native coronary artery without angina pectoris: Secondary | ICD-10-CM | POA: Insufficient documentation

## 2015-02-09 DIAGNOSIS — I700 Atherosclerosis of aorta: Secondary | ICD-10-CM | POA: Insufficient documentation

## 2015-02-09 DIAGNOSIS — C171 Malignant neoplasm of jejunum: Secondary | ICD-10-CM | POA: Diagnosis present

## 2015-02-09 DIAGNOSIS — C787 Secondary malignant neoplasm of liver and intrahepatic bile duct: Secondary | ICD-10-CM | POA: Diagnosis not present

## 2015-02-09 LAB — GLUCOSE, CAPILLARY: GLUCOSE-CAPILLARY: 92 mg/dL (ref 65–99)

## 2015-02-09 MED ORDER — FLUDEOXYGLUCOSE F - 18 (FDG) INJECTION
10.4900 | Freq: Once | INTRAVENOUS | Status: DC | PRN
  Administered 2015-02-09: 10.49 via INTRAVENOUS
  Filled 2015-02-09: qty 10.49

## 2015-02-15 ENCOUNTER — Ambulatory Visit: Payer: Medicare Other

## 2015-02-15 ENCOUNTER — Telehealth: Payer: Self-pay | Admitting: Hematology

## 2015-02-15 ENCOUNTER — Ambulatory Visit (HOSPITAL_BASED_OUTPATIENT_CLINIC_OR_DEPARTMENT_OTHER): Payer: Medicare Other | Admitting: Hematology

## 2015-02-15 ENCOUNTER — Encounter: Payer: Self-pay | Admitting: Hematology

## 2015-02-15 ENCOUNTER — Other Ambulatory Visit (HOSPITAL_BASED_OUTPATIENT_CLINIC_OR_DEPARTMENT_OTHER): Payer: Medicare Other

## 2015-02-15 VITALS — BP 149/54 | HR 68 | Temp 98.0°F | Resp 18 | Ht 66.0 in | Wt 211.9 lb

## 2015-02-15 DIAGNOSIS — C179 Malignant neoplasm of small intestine, unspecified: Secondary | ICD-10-CM | POA: Diagnosis not present

## 2015-02-15 DIAGNOSIS — C787 Secondary malignant neoplasm of liver and intrahepatic bile duct: Secondary | ICD-10-CM | POA: Diagnosis not present

## 2015-02-15 DIAGNOSIS — C772 Secondary and unspecified malignant neoplasm of intra-abdominal lymph nodes: Secondary | ICD-10-CM

## 2015-02-15 LAB — CBC & DIFF AND RETIC
BASO%: 0.4 % (ref 0.0–2.0)
Basophils Absolute: 0 10*3/uL (ref 0.0–0.1)
EOS ABS: 0 10*3/uL (ref 0.0–0.5)
EOS%: 1.6 % (ref 0.0–7.0)
HEMATOCRIT: 31.2 % — AB (ref 38.4–49.9)
HEMOGLOBIN: 9.9 g/dL — AB (ref 13.0–17.1)
IMMATURE RETIC FRACT: 12.1 % — AB (ref 3.00–10.60)
LYMPH%: 36.9 % (ref 14.0–49.0)
MCH: 29.8 pg (ref 27.2–33.4)
MCHC: 31.7 g/dL — ABNORMAL LOW (ref 32.0–36.0)
MCV: 94 fL (ref 79.3–98.0)
MONO#: 0.5 10*3/uL (ref 0.1–0.9)
MONO%: 21.3 % — AB (ref 0.0–14.0)
NEUT%: 39.8 % (ref 39.0–75.0)
NEUTROS ABS: 1 10*3/uL — AB (ref 1.5–6.5)
PLATELETS: 69 10*3/uL — AB (ref 140–400)
RBC: 3.32 10*6/uL — ABNORMAL LOW (ref 4.20–5.82)
RDW: 16 % — ABNORMAL HIGH (ref 11.0–14.6)
Retic %: 1.18 % (ref 0.80–1.80)
Retic Ct Abs: 39.18 10*3/uL (ref 34.80–93.90)
WBC: 2.5 10*3/uL — AB (ref 4.0–10.3)
lymph#: 0.9 10*3/uL (ref 0.9–3.3)

## 2015-02-15 LAB — COMPREHENSIVE METABOLIC PANEL (CC13)
ALBUMIN: 2.7 g/dL — AB (ref 3.5–5.0)
ALK PHOS: 96 U/L (ref 40–150)
ALT: 18 U/L (ref 0–55)
ANION GAP: 7 meq/L (ref 3–11)
AST: 32 U/L (ref 5–34)
BILIRUBIN TOTAL: 0.52 mg/dL (ref 0.20–1.20)
BUN: 11.5 mg/dL (ref 7.0–26.0)
CALCIUM: 8.8 mg/dL (ref 8.4–10.4)
CO2: 26 mEq/L (ref 22–29)
Chloride: 110 mEq/L — ABNORMAL HIGH (ref 98–109)
Creatinine: 0.9 mg/dL (ref 0.7–1.3)
EGFR: 83 mL/min/{1.73_m2} — AB (ref >90)
GLUCOSE: 138 mg/dL (ref 70–140)
POTASSIUM: 3.8 meq/L (ref 3.5–5.1)
Sodium: 143 mEq/L (ref 136–145)
TOTAL PROTEIN: 6.2 g/dL — AB (ref 6.4–8.3)

## 2015-02-15 NOTE — Progress Notes (Signed)
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    08/18/2014 Miscellaneous tumor KRAS mutation (-)   08/18/2014 Pathology Results Liver biopsy showed metastatic adenocarcinoma, consistent with GI primary   08/18/2014 Initial Diagnosis Small bowel cancer   08/18/2014 Procedure small bowel enteroscopy with biopsy by Dr. Benson Norway showed at the proximal jejunum there was evidence of abnormal mucosa and friability, biopsy was obtained.    09/05/2014 Imaging PET hypermetabolic mass involving a small bowel loop with adjacent mesenteric lymphadenopathy, and diffuse liver metastasis and mild Pallor metabolic lymphadenopathy in porta hepatis.   09/06/2014 - 02/01/2015 Chemotherapy mFOLFOX, stopped due to his neuropathy, and earlier mild disease progression on PET    09/07/2014 - 09/11/2014 Hospital Admission He was admitted for fever and GI bleeding. Received 3 units of RBC.   10/26/2014 Imaging Interval significant improvement in the primary small bowel mass, adjacent lymphadenopathy and extensive hepatic metastatic disease. No other new lesions.    02/09/2015 Imaging PET/CT scan showed stable primary malignancy in the proximal jejunum, mild metabolic progression of the 3 residual metabolic liver metastasis. CT  Portion showed decreased size of his liver metastasis.     HISTORY OF PRESENTING ILLNESS:  Tyler Evans 69 y.o. male is here because of a recent diagnosis of small bowel cancer. He presented to  his PCP on 08/17/2014 with complaints of feeling weak and a one month history of a cough. He was found to be anemic and was referred to the emergency department. Hemoglobin was found to be 7.6, MCV 76.6; creatinine was elevated at 1.74. Stool was Hemoccult positive.  CT abdomen/pelvis without contrast on 08/17/2014 showed multiple large masses within the liver, bulky in appearance. The largest measured approximately 5.7 cm. There appeared to be a focal filling defect within the proximal jejunum measuring approximately 2.5 x 1.7 cm. There was mild adjacent jejunal wall thickening. The lung bases were clear.  On 08/18/2014 he underwent a small bowel enteroscopy with biopsy by Dr. Benson Norway. The esophagus and gastric lumen were normal. At the proximal jejunum there was evidence of abnormal mucosa and friability. The pediatric colonoscope was not able to traverse the area. Biopsies were obtained. An ultraslim colonoscope was then utilized. This colonoscope was able to traverse the area of stenosis which measured approximately 3 cm in length. 50% of the lumen was ulcerated. 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 MMR stains are pending.  He was discharged home on 08/19/2014.  INTERIM HISTORY:  Mr. Furtick returns for follow-up and discuss his restaging PET scan. He's been doing very well overall, tolerating treatment well, except his neuropathy. He has moderate numbness and tingling on his fingers and toes, has some difficulty writing and buttoning, no hand function issue, no gait or balance issue. He has increased cold sensitivity also. He does not have significant pain on the fingers or toes. Otherwise he is doing well, has good appetite and eating well. He is able tolerate a  routine activity without difficulty. No fever or chills.  MEDICAL HISTORY:  Past Medical History  Diagnosis Date  . Diabetes mellitus without complication   . Hypertension   . Small bowel  cancer 08/18/2014    SURGICAL HISTORY: Past Surgical History  Procedure Laterality Date  . Enteroscopy N/A 08/18/2014    Procedure: ENTEROSCOPY;  Surgeon: Carol Ada, MD;  Location: [facility];  Service: Endoscopy;  Laterality: N/A;    SOCIAL HISTORY: History   Social History  . Marital Status: Married    Spouse Name: N/A  . Number of Children: 2  . Years of Education: N/A   Occupational History  .  retired Ambulance person    Social History Main Topics  . Smoking status: Never Smoker   . Smokeless tobacco: Not on file  . Alcohol Use: No  . Drug Use: Not on file  . Sexual Activity: Not on file   Other Topics Concern  . Not on file   Social History Narrative  He lives in [place]. He is married. He has 2 children both reported to be in good health. No tobacco or alcohol use. He is a retired Ambulance person.  FAMILY HISTORY: Family History  Problem Relation Age of Onset  . Heart failure, Alzheimer's  Mother   . Lung cancer Father   . Kidney cancer Brother     ALLERGIES:  is allergic to penicillins.  MEDICATIONS:  Current Outpatient Prescriptions  Medication Sig Dispense Refill  . atorvastatin (LIPITOR) 20 MG tablet Take 20 mg by mouth daily.    Marland Kitchen glipiZIDE (GLUCOTROL) 10 MG tablet Take 10 mg by mouth 2 (two) times daily before a meal.    . insulin glargine (LANTUS) 100 UNIT/ML injection Inject 40 Units into the skin at bedtime.    . pantoprazole (PROTONIX) 40 MG tablet Take 1 tablet (40 mg total) by mouth daily. 30 tablet 0  . chlorthalidone (HYGROTON) 25 MG tablet Take 25 mg by mouth daily.    Marland Kitchen lisinopril (PRINIVIL,ZESTRIL) 40 MG tablet Take 40 mg by mouth at bedtime.    . metFORMIN (GLUCOPHAGE) 1000 MG tablet Take 1,000 mg by mouth 2 (two) times daily with a meal.         REVIEW OF SYSTEMS:   Constitutional: Denies fevers, chills or abnormal night sweats; mild fatigue, weight is stable.  Eyes: Denies blurriness of vision, double vision or watery  eyes Ears, nose, mouth, throat, and face: Denies mucositis or sore throat Respiratory: One month history of a nonproductive cough;  no shortness of breath Cardiovascular: Denies palpitation, chest discomfort or lower extremity swelling Gastrointestinal:  Denies nausea, heartburn. No dysphagia. He has noted less frequent bowel movements over the past 2 weeks. Does not characterize this as constipation. Skin: Denies abnormal skin rashes Lymphatics: Denies new lymphadenopathy or easy bruising Neurological: Denies numbness, tingling. No extremity weakness Behavioral/Psych: Mood is stable, no new changes  All other systems were reviewed with the patient and are negative.  PHYSICAL EXAMINATION: ECOG PERFORMANCE STATUS: 1 - Symptomatic but completely ambulatory  Filed Vitals:   02/15/15 0930  BP: 149/54  Pulse: 68  Temp: 98 F (36.7 C)  Resp: 18   Filed Weights   02/15/15 0930  Weight: 211 lb 14.4 oz (96.117 kg)    GENERAL:alert, no distress and comfortable SKIN: skin color, texture, turgor are normal, no rashes or significant lesions EYES: normal, conjunctiva are pink and non-injected, sclera clear OROPHARYNX:no exudate, no erythema and lips, buccal mucosa, and tongue normal  NECK: supple, thyroid normal size, non-tender, without nodularity LYMPH:  no palpable lymphadenopathy in the cervical, axillary or inguinal regions LUNGS: clear to auscultation and percussion with normal breathing effort HEART: regular rate & rhythm and no murmurs and no lower extremity edema ABDOMEN:abdomen soft, mild tenderness at the right upper quadrant , no rebound pain, normal bowel sounds Musculoskeletal:no cyanosis of digits and no clubbing  PSYCH: alert & oriented x 3 with fluent speech NEURO: no focal motor/sensory deficits  LABORATORY DATA:  I have reviewed the data as listed CBC Latest Ref Rng 02/15/2015 02/01/2015 01/18/2015  WBC 4.0 - 10.3 10e3/uL 2.5(L) 7.1 7.7  Hemoglobin 13.0 - 17.1 g/dL 9.9(L)  10.9(L) 10.9(L)  Hematocrit 38.4 - 49.9 % 31.2(L) 34.5(L) 34.6(L)  Platelets 140 - 400 10e3/uL 69(L) 111(L) 109(L)    CMP Latest Ref Rng 02/15/2015 02/01/2015 01/18/2015  Glucose 70 - 140 mg/dl 138 122 121  BUN 7.0 - 26.0 mg/dL 11.5 10.9 8.6  Creatinine 0.7 - 1.3 mg/dL 0.9 1.0 0.9  Sodium 136 - 145 mEq/L 143 142 141  Potassium 3.5 - 5.1 mEq/L 3.8 4.1 3.8  Chloride 96 - 112 mmol/L - - -  CO2 22 - 29 mEq/L _0 Calcium 8.4 - 10.4 mg/dL 8.8 9.1 9.0  Total Protein 6.4 - 8.3 g/dL 6.2(L) 6.7 6.4  Total Bilirubin 0.20 - 1.20 mg/dL 0.52 0.45 0.40  Alkaline Phos 40 - 150 U/L 96 148 134  AST 5 - 34 U/L 32 33 38(H)  ALT 0 - 55 U/L _1 Pathology report:  Small Intestine Biopsy, jejunal mass 08/18/2014 - INVASIVE ADENOCARCINOMA IN A BACKGROUND OF TUBULOVILLOUS ADENOMA. ADDITIONAL INFORMATION: Mismatch Repair (MMR) Protein Immunohistochemistry (IHC) IHC Expression Result (LIMITED TUMOR): MLH1: Preserved nuclear expression (greater 50% tumor expression) MSH2: Preserved nuclear expression (greater 50% tumor expression) MSH6: Preserved nuclear expression (greater 50% tumor expression) PMS2: Preserved nuclear expression (greater 50% tumor expression) * Internal control demonstrates intact nuclear expression Interpretation: NORMAL There is preserved expression  Diagnosis 08/28/2014  Liver, needle/core biopsy, right lobe - METASTATIC ADENOCARCINOMA   RADIOGRAPHIC STUDIES: I have personally reviewed the radiological images as listed and agreed with the findings in the report.  Nm Pet Image Initial (pi) Skull Base To Thigh 09/05/2014    IMPRESSION: Hypermetabolic mass involving a small bowel loop with adjacent mesenteric lymphadenopathy in the left abdomen, consistent with known primary small bowel carcinoma. No evidence of small bowel obstruction.  Diffuse liver metastases. Mild hypermetabolic lymphadenopathy in porta hepatis, also suspicious for metastatic disease.  No evidence of  metastatic disease within the neck, chest, or pelvis.  Incidental findings including cholelithiasis, diverticulosis, and mildly enlarged prostate.   Electronically Signed   By: Earle Gell M.D.   On: 09/05/2014 17:29   PET 02/09/2015 IMPRESSION: 1. No appreciable metabolic change at the site of the persistently hypermetabolic primary malignancy in the proximal jejunum. 2. Mild metabolic progression of the three residual hypermetabolic liver metastases. 3. No new sites of hypermetabolic metastatic disease. 4. Worsening near complete left sphenoid sinus opacification. Enlarging mucous retention cyst in the right maxillary sinus. 5. Cholelithiasis.    ASSESSMENT & PLAN:  69 year old gentleman with past medical history of diabetes, hypertension, who presents with anemia and weakness.  1.Small bowel cancer metastatic to the liver and abdominal nodes, MMR normal, KRAS/NRAS wild type -I reviewed his imaging findings, jejunum biopsy and liver biopsy results with patient and his wife extensively.  -Unfortunately he has stage IV disease with diffuse  liver metastasis, this is an incurable disease, with overall very poor prognosis. -he hasn't had excellent response to first-line FOLFOX, however his neuropathy has been getting worse, G2 now, progress to even with reduced dose of oxaliplatin, so I recommend him to change to second line therapy.  -I discussed the recent PET/CT findings with pt and reviewed all images with them in person. He does have slightly increased hypermetabolic activity in his liver lesions, however the CT portion of the scan showed significant decrease in the size of the liver metastasis. I spoke with radiology, and they will addendum the report. His CEA level are also stable and remains to be low, I think overall he is still responding to treatment, but possible early resistant clones are developing. -given his overall good performance status, I recommend him to change to FOLFIRI,  potential side effect of irritation were discussed with patient, especially diarrhea an infusion reaction, chemotherapy consent was obtained today. -given the negative KRAS/NRAS GENE MUTATION, I would consider EGFR targeted therapy Panitumumab, if his insurance company will approve it. I'll started with the second cycle  FOLFIRI.  -Lab reviewed, he has moderate thrombocytopenia, we'll postpone his chemotherapy to next week.  -We discussed that he may need surgery or radiation if he has recurrent severe GI bleeding. He agrees. Hopefully the chemotherapy will control his cancer, and prevent further bleeding episodes.   2. Microcytic anemia secondary to GI bleeding and iron deficiency -His serum iron level and saturation were low, although ferritin is normal, he has some degree of iron deficient anemia, and anemia of malignancy  -Consider blood transfusion if hemoglobin less than 8 or symptomatic anemia -he received IV feraheme 510 mg twice in 09/2014 -His hemoglobin is 9.9, Slightly dropped after last cycle chemotherapy  3. Thrombocytopenia -secondary to chemotherapy, continue monitoring  4. Hypertension and DM  -He will continue follow-up with his primary care physician. -We discussed that we will monitor his blood pressure and blood sugar closely and may need to adjust his medication during the chemotherapy   5. CKD, stage III -improved, Cr 0.9 today -I encouraged him to drink adequately and avoid dehydration.   Plan: -change chemo to FOLFIRI, evey 2 weeks, starting next week -will add Panitumumab from second cycle FOLFIRI -I will see him back in 3 weeks with second cycle  Truitt Merle  02/15/2015

## 2015-02-15 NOTE — Telephone Encounter (Signed)
Gave and pritned appt sched and avs for pt for SEpt and OCT

## 2015-02-17 ENCOUNTER — Ambulatory Visit: Payer: Medicare Other

## 2015-02-21 ENCOUNTER — Other Ambulatory Visit: Payer: Self-pay | Admitting: Hematology

## 2015-02-21 ENCOUNTER — Other Ambulatory Visit (HOSPITAL_BASED_OUTPATIENT_CLINIC_OR_DEPARTMENT_OTHER): Payer: Medicare Other

## 2015-02-21 ENCOUNTER — Telehealth: Payer: Self-pay | Admitting: *Deleted

## 2015-02-21 ENCOUNTER — Ambulatory Visit (HOSPITAL_BASED_OUTPATIENT_CLINIC_OR_DEPARTMENT_OTHER): Payer: Medicare Other

## 2015-02-21 ENCOUNTER — Other Ambulatory Visit: Payer: Self-pay | Admitting: *Deleted

## 2015-02-21 VITALS — BP 123/49 | HR 67 | Temp 98.3°F

## 2015-02-21 DIAGNOSIS — C179 Malignant neoplasm of small intestine, unspecified: Secondary | ICD-10-CM

## 2015-02-21 DIAGNOSIS — Z23 Encounter for immunization: Secondary | ICD-10-CM | POA: Diagnosis not present

## 2015-02-21 DIAGNOSIS — C787 Secondary malignant neoplasm of liver and intrahepatic bile duct: Secondary | ICD-10-CM | POA: Diagnosis not present

## 2015-02-21 DIAGNOSIS — C772 Secondary and unspecified malignant neoplasm of intra-abdominal lymph nodes: Secondary | ICD-10-CM | POA: Diagnosis not present

## 2015-02-21 LAB — CBC & DIFF AND RETIC
BASO%: 0 % (ref 0.0–2.0)
Basophils Absolute: 0 10*3/uL (ref 0.0–0.1)
EOS%: 2.8 % (ref 0.0–7.0)
Eosinophils Absolute: 0.1 10*3/uL (ref 0.0–0.5)
HCT: 32.6 % — ABNORMAL LOW (ref 38.4–49.9)
HGB: 10.3 g/dL — ABNORMAL LOW (ref 13.0–17.1)
Immature Retic Fract: 9.2 % (ref 3.00–10.60)
LYMPH#: 1.1 10*3/uL (ref 0.9–3.3)
LYMPH%: 42.6 % (ref 14.0–49.0)
MCH: 29.9 pg (ref 27.2–33.4)
MCHC: 31.6 g/dL — AB (ref 32.0–36.0)
MCV: 94.5 fL (ref 79.3–98.0)
MONO#: 0.6 10*3/uL (ref 0.1–0.9)
MONO%: 25.7 % — ABNORMAL HIGH (ref 0.0–14.0)
NEUT%: 28.9 % — ABNORMAL LOW (ref 39.0–75.0)
NEUTROS ABS: 0.7 10*3/uL — AB (ref 1.5–6.5)
Platelets: 137 10*3/uL — ABNORMAL LOW (ref 140–400)
RBC: 3.45 10*6/uL — AB (ref 4.20–5.82)
RDW: 16.4 % — AB (ref 11.0–14.6)
RETIC %: 1.28 % (ref 0.80–1.80)
RETIC CT ABS: 44.16 10*3/uL (ref 34.80–93.90)
WBC: 2.5 10*3/uL — ABNORMAL LOW (ref 4.0–10.3)

## 2015-02-21 LAB — COMPREHENSIVE METABOLIC PANEL (CC13)
ALT: 18 U/L (ref 0–55)
ANION GAP: 7 meq/L (ref 3–11)
AST: 32 U/L (ref 5–34)
Albumin: 2.8 g/dL — ABNORMAL LOW (ref 3.5–5.0)
Alkaline Phosphatase: 108 U/L (ref 40–150)
BUN: 11 mg/dL (ref 7.0–26.0)
CHLORIDE: 111 meq/L — AB (ref 98–109)
CO2: 25 meq/L (ref 22–29)
CREATININE: 1 mg/dL (ref 0.7–1.3)
Calcium: 8.9 mg/dL (ref 8.4–10.4)
EGFR: 80 mL/min/{1.73_m2} — ABNORMAL LOW (ref >90)
Glucose: 71 mg/dl (ref 70–140)
Potassium: 3.4 mEq/L — ABNORMAL LOW (ref 3.5–5.1)
Sodium: 143 mEq/L (ref 136–145)
Total Bilirubin: 0.45 mg/dL (ref 0.20–1.20)
Total Protein: 6.6 g/dL (ref 6.4–8.3)

## 2015-02-21 MED ORDER — INFLUENZA VAC SPLIT QUAD 0.5 ML IM SUSY
0.5000 mL | PREFILLED_SYRINGE | Freq: Once | INTRAMUSCULAR | Status: AC
  Administered 2015-02-21: 0.5 mL via INTRAMUSCULAR
  Filled 2015-02-21: qty 0.5

## 2015-02-21 NOTE — Telephone Encounter (Signed)
Notified pt of WBC/ANC results.  ANC 0.7, which is too low for treatment.  Per Dr Burr Medico, need to r/s to Monday or Tuesday for lab & treatment. Informed that scheduler should call him. Pt knows neutropenic precautions & expressed understanding.

## 2015-02-22 ENCOUNTER — Ambulatory Visit: Payer: Medicare Other

## 2015-02-23 ENCOUNTER — Other Ambulatory Visit: Payer: Self-pay | Admitting: *Deleted

## 2015-02-23 ENCOUNTER — Encounter: Payer: Self-pay | Admitting: Hematology

## 2015-02-26 ENCOUNTER — Other Ambulatory Visit: Payer: Self-pay | Admitting: *Deleted

## 2015-02-26 ENCOUNTER — Other Ambulatory Visit: Payer: Self-pay | Admitting: Hematology

## 2015-02-27 ENCOUNTER — Ambulatory Visit (HOSPITAL_BASED_OUTPATIENT_CLINIC_OR_DEPARTMENT_OTHER): Payer: Medicare Other

## 2015-02-27 ENCOUNTER — Other Ambulatory Visit: Payer: Self-pay | Admitting: Hematology

## 2015-02-27 ENCOUNTER — Other Ambulatory Visit (HOSPITAL_BASED_OUTPATIENT_CLINIC_OR_DEPARTMENT_OTHER): Payer: Medicare Other

## 2015-02-27 VITALS — BP 143/63 | HR 72 | Temp 97.4°F | Resp 20

## 2015-02-27 DIAGNOSIS — Z5112 Encounter for antineoplastic immunotherapy: Secondary | ICD-10-CM

## 2015-02-27 DIAGNOSIS — C772 Secondary and unspecified malignant neoplasm of intra-abdominal lymph nodes: Secondary | ICD-10-CM | POA: Diagnosis not present

## 2015-02-27 DIAGNOSIS — Z5111 Encounter for antineoplastic chemotherapy: Secondary | ICD-10-CM

## 2015-02-27 DIAGNOSIS — C179 Malignant neoplasm of small intestine, unspecified: Secondary | ICD-10-CM

## 2015-02-27 DIAGNOSIS — C787 Secondary malignant neoplasm of liver and intrahepatic bile duct: Secondary | ICD-10-CM

## 2015-02-27 LAB — COMPREHENSIVE METABOLIC PANEL (CC13)
ALT: 18 U/L (ref 0–55)
ANION GAP: 8 meq/L (ref 3–11)
AST: 30 U/L (ref 5–34)
Albumin: 2.8 g/dL — ABNORMAL LOW (ref 3.5–5.0)
Alkaline Phosphatase: 106 U/L (ref 40–150)
BILIRUBIN TOTAL: 0.48 mg/dL (ref 0.20–1.20)
BUN: 15.2 mg/dL (ref 7.0–26.0)
CALCIUM: 9.1 mg/dL (ref 8.4–10.4)
CHLORIDE: 111 meq/L — AB (ref 98–109)
CO2: 23 meq/L (ref 22–29)
CREATININE: 1 mg/dL (ref 0.7–1.3)
EGFR: 74 mL/min/{1.73_m2} — ABNORMAL LOW (ref >90)
Glucose: 93 mg/dl (ref 70–140)
Potassium: 3.7 mEq/L (ref 3.5–5.1)
Sodium: 142 mEq/L (ref 136–145)
TOTAL PROTEIN: 6.6 g/dL (ref 6.4–8.3)

## 2015-02-27 LAB — CBC & DIFF AND RETIC
BASO%: 0.2 % (ref 0.0–2.0)
BASOS ABS: 0 10*3/uL (ref 0.0–0.1)
EOS%: 1.8 % (ref 0.0–7.0)
Eosinophils Absolute: 0.1 10*3/uL (ref 0.0–0.5)
HEMATOCRIT: 33.4 % — AB (ref 38.4–49.9)
HGB: 10.6 g/dL — ABNORMAL LOW (ref 13.0–17.1)
IMMATURE RETIC FRACT: 13 % — AB (ref 3.00–10.60)
LYMPH#: 1.1 10*3/uL (ref 0.9–3.3)
LYMPH%: 25.3 % (ref 14.0–49.0)
MCH: 29.9 pg (ref 27.2–33.4)
MCHC: 31.7 g/dL — ABNORMAL LOW (ref 32.0–36.0)
MCV: 94.4 fL (ref 79.3–98.0)
MONO#: 0.6 10*3/uL (ref 0.1–0.9)
MONO%: 14.3 % — ABNORMAL HIGH (ref 0.0–14.0)
NEUT#: 2.5 10*3/uL (ref 1.5–6.5)
NEUT%: 58.4 % (ref 39.0–75.0)
PLATELETS: 119 10*3/uL — AB (ref 140–400)
RBC: 3.54 10*6/uL — AB (ref 4.20–5.82)
RDW: 16.1 % — AB (ref 11.0–14.6)
RETIC %: 1.61 % (ref 0.80–1.80)
RETIC CT ABS: 56.99 10*3/uL (ref 34.80–93.90)
WBC: 4.3 10*3/uL (ref 4.0–10.3)

## 2015-02-27 MED ORDER — ATROPINE SULFATE 1 MG/ML IJ SOLN
0.5000 mg | Freq: Once | INTRAMUSCULAR | Status: AC | PRN
  Administered 2015-02-27: 0.5 mg via INTRAVENOUS

## 2015-02-27 MED ORDER — SODIUM CHLORIDE 0.9 % IV SOLN
6.0000 mg/kg | Freq: Once | INTRAVENOUS | Status: AC
  Administered 2015-02-27: 580 mg via INTRAVENOUS
  Filled 2015-02-27: qty 29

## 2015-02-27 MED ORDER — SODIUM CHLORIDE 0.9 % IV SOLN
Freq: Once | INTRAVENOUS | Status: AC
  Administered 2015-02-27: 13:00:00 via INTRAVENOUS
  Filled 2015-02-27: qty 8

## 2015-02-27 MED ORDER — SODIUM CHLORIDE 0.9 % IV SOLN
Freq: Once | INTRAVENOUS | Status: AC
  Administered 2015-02-27: 13:00:00 via INTRAVENOUS

## 2015-02-27 MED ORDER — SODIUM CHLORIDE 0.9 % IV SOLN
2400.0000 mg/m2 | INTRAVENOUS | Status: DC
  Administered 2015-02-27: 5100 mg via INTRAVENOUS
  Filled 2015-02-27: qty 102

## 2015-02-27 MED ORDER — SODIUM CHLORIDE 0.9 % IV SOLN
179.0000 mg/m2 | Freq: Once | INTRAVENOUS | Status: AC
  Administered 2015-02-27: 380 mg via INTRAVENOUS
  Filled 2015-02-27: qty 19

## 2015-02-27 MED ORDER — SODIUM CHLORIDE 0.9 % IV SOLN
400.0000 mg/m2 | Freq: Once | INTRAVENOUS | Status: AC
  Administered 2015-02-27: 848 mg via INTRAVENOUS
  Filled 2015-02-27: qty 42.4

## 2015-02-27 NOTE — Patient Instructions (Addendum)
[facility] Discharge Instructions for Patients Receiving Chemotherapy  Today you received the following chemotherapy agents Irinotecan, Leucovorin, and 5FU  To help prevent nausea and vomiting after your treatment, we encourage you to take your nausea medication Zofran 8 mg every 12 hours as needed   If you develop nausea and vomiting that is not controlled by your nausea medication, call the clinic.   BELOW ARE SYMPTOMS THAT SHOULD BE REPORTED IMMEDIATELY:  *FEVER GREATER THAN 100.5 F  *CHILLS WITH OR WITHOUT FEVER  NAUSEA AND VOMITING THAT IS NOT CONTROLLED WITH YOUR NAUSEA MEDICATION  *UNUSUAL SHORTNESS OF BREATH  *UNUSUAL BRUISING OR BLEEDING  TENDERNESS IN MOUTH AND THROAT WITH OR WITHOUT PRESENCE OF ULCERS  *URINARY PROBLEMS  *BOWEL PROBLEMS  UNUSUAL RASH Items with * indicate a potential emergency and should be followed up as soon as possible.  Feel free to call the clinic you have any questions or concerns. The clinic phone number is [phone].  Please show the [facility] at check-in to the Emergency Department and triage nurse.

## 2015-02-28 LAB — CEA: CEA: 6 ng/mL — AB (ref 0.0–5.0)

## 2015-03-01 ENCOUNTER — Other Ambulatory Visit: Payer: Self-pay | Admitting: Hematology

## 2015-03-01 ENCOUNTER — Other Ambulatory Visit: Payer: Medicare Other

## 2015-03-01 ENCOUNTER — Ambulatory Visit (HOSPITAL_BASED_OUTPATIENT_CLINIC_OR_DEPARTMENT_OTHER): Payer: Medicare Other

## 2015-03-01 ENCOUNTER — Ambulatory Visit: Payer: Medicare Other | Admitting: Hematology

## 2015-03-01 ENCOUNTER — Ambulatory Visit: Payer: Medicare Other

## 2015-03-01 VITALS — BP 125/47 | HR 63 | Temp 97.5°F | Resp 18

## 2015-03-01 DIAGNOSIS — C179 Malignant neoplasm of small intestine, unspecified: Secondary | ICD-10-CM

## 2015-03-01 DIAGNOSIS — C772 Secondary and unspecified malignant neoplasm of intra-abdominal lymph nodes: Secondary | ICD-10-CM | POA: Diagnosis not present

## 2015-03-01 DIAGNOSIS — Z5189 Encounter for other specified aftercare: Secondary | ICD-10-CM

## 2015-03-01 DIAGNOSIS — C787 Secondary malignant neoplasm of liver and intrahepatic bile duct: Secondary | ICD-10-CM | POA: Diagnosis not present

## 2015-03-01 MED ORDER — SODIUM CHLORIDE 0.9 % IJ SOLN
10.0000 mL | INTRAMUSCULAR | Status: DC | PRN
  Administered 2015-03-01: 10 mL
  Filled 2015-03-01: qty 10

## 2015-03-01 MED ORDER — PEGFILGRASTIM INJECTION 6 MG/0.6ML ~~LOC~~
6.0000 mg | PREFILLED_SYRINGE | Freq: Once | SUBCUTANEOUS | Status: AC
  Administered 2015-03-01: 6 mg via SUBCUTANEOUS
  Filled 2015-03-01: qty 0.6

## 2015-03-01 MED ORDER — HEPARIN SOD (PORK) LOCK FLUSH 100 UNIT/ML IV SOLN
500.0000 [IU] | Freq: Once | INTRAVENOUS | Status: AC | PRN
  Administered 2015-03-01: 500 [IU]
  Filled 2015-03-01: qty 5

## 2015-03-01 NOTE — Patient Instructions (Signed)
Pegfilgrastim injection What is this medicine? PEGFILGRASTIM (peg fil GRA stim) is a long-acting granulocyte colony-stimulating factor that stimulates the growth of neutrophils, a type of white blood cell important in the body's fight against infection. It is used to reduce the incidence of fever and infection in patients with certain types of cancer who are receiving chemotherapy that affects the bone marrow. This medicine may be used for other purposes; ask your health care provider or pharmacist if you have questions. COMMON BRAND NAME(S): Neulasta What should I tell my health care provider before I take this medicine? They need to know if you have any of these conditions: -latex allergy -ongoing radiation therapy -sickle cell disease -skin reactions to acrylic adhesives (On-Body Injector only) -an unusual or allergic reaction to pegfilgrastim, filgrastim, other medicines, foods, dyes, or preservatives -pregnant or trying to get pregnant -breast-feeding How should I use this medicine? This medicine is for injection under the skin. If you get this medicine at home, you will be taught how to prepare and give the pre-filled syringe or how to use the On-body Injector. Refer to the patient Instructions for Use for detailed instructions. Use exactly as directed. Take your medicine at regular intervals. Do not take your medicine more often than directed. It is important that you put your used needles and syringes in a special sharps container. Do not put them in a trash can. If you do not have a sharps container, call your pharmacist or healthcare provider to get one. Talk to your pediatrician regarding the use of this medicine in children. Special care may be needed. Overdosage: If you think you have taken too much of this medicine contact a poison control center or emergency room at once. NOTE: This medicine is only for you. Do not share this medicine with others. What if I miss a dose? It is  important not to miss your dose. Call your doctor or health care professional if you miss your dose. If you miss a dose due to an On-body Injector failure or leakage, a new dose should be administered as soon as possible using a single prefilled syringe for manual use. What may interact with this medicine? Interactions have not been studied. Give your health care provider a list of all the medicines, herbs, non-prescription drugs, or dietary supplements you use. Also tell them if you smoke, drink alcohol, or use illegal drugs. Some items may interact with your medicine. This list may not describe all possible interactions. Give your health care provider a list of all the medicines, herbs, non-prescription drugs, or dietary supplements you use. Also tell them if you smoke, drink alcohol, or use illegal drugs. Some items may interact with your medicine. What should I watch for while using this medicine? You may need blood work done while you are taking this medicine. If you are going to need a MRI, CT scan, or other procedure, tell your doctor that you are using this medicine (On-Body Injector only). What side effects may I notice from receiving this medicine? Side effects that you should report to your doctor or health care professional as soon as possible: -allergic reactions like skin rash, itching or hives, swelling of the face, lips, or tongue -dizziness -fever -pain, redness, or irritation at site where injected -pinpoint red spots on the skin -shortness of breath or breathing problems -stomach or side pain, or pain at the shoulder -swelling -tiredness -trouble passing urine Side effects that usually do not require medical attention (report to your doctor   or health care professional if they continue or are bothersome): -bone pain -muscle pain This list may not describe all possible side effects. Call your doctor for medical advice about side effects. You may report side effects to FDA at  1-800-FDA-1088. Where should I keep my medicine? Keep out of the reach of children. Store pre-filled syringes in a refrigerator between 2 and 8 degrees C (36 and 46 degrees F). Do not freeze. Keep in carton to protect from light. Throw away this medicine if it is left out of the refrigerator for more than 48 hours. Throw away any unused medicine after the expiration date. NOTE: This sheet is a summary. It may not cover all possible information. If you have questions about this medicine, talk to your doctor, pharmacist, or health care provider.  2015, Elsevier/Gold Standard. (2013-08-18 16:14:05)  

## 2015-03-02 ENCOUNTER — Telehealth: Payer: Self-pay | Admitting: Hematology

## 2015-03-02 ENCOUNTER — Telehealth: Payer: Self-pay | Admitting: *Deleted

## 2015-03-02 NOTE — Telephone Encounter (Signed)
per pof to sch pt appt-cld & spoke to pt and gave pt time & date of appt °

## 2015-03-02 NOTE — Telephone Encounter (Signed)
per pof tos ch pt appt-sent MW email to sch trmt-will call pt after reply °

## 2015-03-02 NOTE — Telephone Encounter (Signed)
Per staff message and POF I have scheduled appts. Advised scheduler of appts. JMW  

## 2015-03-03 ENCOUNTER — Ambulatory Visit: Payer: Medicare Other

## 2015-03-07 ENCOUNTER — Other Ambulatory Visit: Payer: Medicare Other

## 2015-03-07 ENCOUNTER — Encounter: Payer: Self-pay | Admitting: Hematology

## 2015-03-08 ENCOUNTER — Ambulatory Visit: Payer: Medicare Other | Admitting: Hematology

## 2015-03-08 ENCOUNTER — Ambulatory Visit: Payer: Medicare Other

## 2015-03-09 ENCOUNTER — Telehealth: Payer: Self-pay | Admitting: *Deleted

## 2015-03-09 ENCOUNTER — Other Ambulatory Visit: Payer: Self-pay | Admitting: Hematology

## 2015-03-09 MED ORDER — CLINDAMYCIN PHOSPHATE 1 % EX GEL: Freq: Two times a day (BID) | CUTANEOUS | Status: DC

## 2015-03-09 MED ORDER — HYDROCORTISONE 2.5 % EX CREA
TOPICAL_CREAM | Freq: Two times a day (BID) | CUTANEOUS | Status: DC
Start: 2015-03-09 — End: 2016-03-21

## 2015-03-09 NOTE — Telephone Encounter (Signed)
I have called in 2.5% hydrocortisone cream (to replace his OTC hydrocortisone) and clindamycin gel to his pharmacy. Please let him know.  Truitt Merle 03/09/2015

## 2015-03-09 NOTE — Telephone Encounter (Signed)
Patient called stating that he is developing itchy rashes over his body (head, face, and chest). Patient states that hydrocortisone does not work and MD Burr Medico stated that she would prescribe a certain medication this occurs. Message sent to MD [facility].

## 2015-03-09 NOTE — Telephone Encounter (Signed)
Informed patient and verbalized understanding.  

## 2015-03-13 ENCOUNTER — Ambulatory Visit (HOSPITAL_BASED_OUTPATIENT_CLINIC_OR_DEPARTMENT_OTHER): Payer: Medicare Other | Admitting: Hematology

## 2015-03-13 ENCOUNTER — Other Ambulatory Visit (HOSPITAL_BASED_OUTPATIENT_CLINIC_OR_DEPARTMENT_OTHER): Payer: Medicare Other

## 2015-03-13 ENCOUNTER — Encounter: Payer: Self-pay | Admitting: Hematology

## 2015-03-13 ENCOUNTER — Ambulatory Visit (HOSPITAL_BASED_OUTPATIENT_CLINIC_OR_DEPARTMENT_OTHER): Payer: Medicare Other

## 2015-03-13 VITALS — BP 125/45 | HR 73 | Temp 98.2°F | Resp 17 | Ht 66.0 in | Wt 204.0 lb

## 2015-03-13 DIAGNOSIS — C787 Secondary malignant neoplasm of liver and intrahepatic bile duct: Secondary | ICD-10-CM | POA: Diagnosis not present

## 2015-03-13 DIAGNOSIS — C171 Malignant neoplasm of jejunum: Secondary | ICD-10-CM

## 2015-03-13 DIAGNOSIS — C170 Malignant neoplasm of duodenum: Secondary | ICD-10-CM

## 2015-03-13 DIAGNOSIS — D6959 Other secondary thrombocytopenia: Secondary | ICD-10-CM

## 2015-03-13 DIAGNOSIS — D509 Iron deficiency anemia, unspecified: Secondary | ICD-10-CM

## 2015-03-13 DIAGNOSIS — K922 Gastrointestinal hemorrhage, unspecified: Secondary | ICD-10-CM

## 2015-03-13 DIAGNOSIS — I10 Essential (primary) hypertension: Secondary | ICD-10-CM

## 2015-03-13 DIAGNOSIS — Z5111 Encounter for antineoplastic chemotherapy: Secondary | ICD-10-CM

## 2015-03-13 DIAGNOSIS — N183 Chronic kidney disease, stage 3 (moderate): Secondary | ICD-10-CM

## 2015-03-13 DIAGNOSIS — L270 Generalized skin eruption due to drugs and medicaments taken internally: Secondary | ICD-10-CM

## 2015-03-13 DIAGNOSIS — C179 Malignant neoplasm of small intestine, unspecified: Secondary | ICD-10-CM

## 2015-03-13 DIAGNOSIS — E119 Type 2 diabetes mellitus without complications: Secondary | ICD-10-CM

## 2015-03-13 LAB — COMPREHENSIVE METABOLIC PANEL (CC13)
ALBUMIN: 2.9 g/dL — AB (ref 3.5–5.0)
ALK PHOS: 123 U/L (ref 40–150)
ALT: 22 U/L (ref 0–55)
AST: 27 U/L (ref 5–34)
Anion Gap: 9 mEq/L (ref 3–11)
BILIRUBIN TOTAL: 0.34 mg/dL (ref 0.20–1.20)
BUN: 10.2 mg/dL (ref 7.0–26.0)
CALCIUM: 9 mg/dL (ref 8.4–10.4)
CO2: 26 mEq/L (ref 22–29)
CREATININE: 1.2 mg/dL (ref 0.7–1.3)
Chloride: 109 mEq/L (ref 98–109)
EGFR: 62 mL/min/{1.73_m2} — ABNORMAL LOW (ref >90)
Glucose: 163 mg/dl — ABNORMAL HIGH (ref 70–140)
POTASSIUM: 4 meq/L (ref 3.5–5.1)
Sodium: 144 mEq/L (ref 136–145)
TOTAL PROTEIN: 6.8 g/dL (ref 6.4–8.3)

## 2015-03-13 LAB — CBC & DIFF AND RETIC
BASO%: 0.2 % (ref 0.0–2.0)
BASOS ABS: 0 10*3/uL (ref 0.0–0.1)
EOS ABS: 0.1 10*3/uL (ref 0.0–0.5)
EOS%: 1.3 % (ref 0.0–7.0)
HEMATOCRIT: 32.4 % — AB (ref 38.4–49.9)
HEMOGLOBIN: 10.3 g/dL — AB (ref 13.0–17.1)
IMMATURE RETIC FRACT: 16.6 % — AB (ref 3.00–10.60)
LYMPH%: 23 % (ref 14.0–49.0)
MCH: 29.9 pg (ref 27.2–33.4)
MCHC: 31.8 g/dL — ABNORMAL LOW (ref 32.0–36.0)
MCV: 94.2 fL (ref 79.3–98.0)
MONO#: 0.5 10*3/uL (ref 0.1–0.9)
MONO%: 10 % (ref 0.0–14.0)
NEUT#: 3.1 10*3/uL (ref 1.5–6.5)
NEUT%: 65.5 % (ref 39.0–75.0)
Platelets: 116 10*3/uL — ABNORMAL LOW (ref 140–400)
RBC: 3.44 10*6/uL — ABNORMAL LOW (ref 4.20–5.82)
RDW: 15.2 % — AB (ref 11.0–14.6)
RETIC %: 1.72 % (ref 0.80–1.80)
Retic Ct Abs: 59.17 10*3/uL (ref 34.80–93.90)
WBC: 4.7 10*3/uL (ref 4.0–10.3)
lymph#: 1.1 10*3/uL (ref 0.9–3.3)

## 2015-03-13 MED ORDER — SODIUM CHLORIDE 0.9 % IV SOLN
179.0000 mg/m2 | Freq: Once | INTRAVENOUS | Status: AC
  Administered 2015-03-13: 380 mg via INTRAVENOUS
  Filled 2015-03-13: qty 19

## 2015-03-13 MED ORDER — IRINOTECAN HCL CHEMO INJECTION 100 MG/5ML: 179.0000 mg/m2 | Freq: Once | INTRAVENOUS | Status: DC

## 2015-03-13 MED ORDER — SODIUM CHLORIDE 0.9 % IV SOLN
2400.0000 mg/m2 | INTRAVENOUS | Status: DC
  Administered 2015-03-13: 5100 mg via INTRAVENOUS
  Filled 2015-03-13: qty 102

## 2015-03-13 MED ORDER — ATROPINE SULFATE 1 MG/ML IJ SOLN
INTRAMUSCULAR | Status: AC
  Filled 2015-03-13: qty 1

## 2015-03-13 MED ORDER — SODIUM CHLORIDE 0.9 % IV SOLN
Freq: Once | INTRAVENOUS | Status: AC
  Administered 2015-03-13: 12:00:00 via INTRAVENOUS
  Filled 2015-03-13: qty 8

## 2015-03-13 MED ORDER — HEPARIN SOD (PORK) LOCK FLUSH 100 UNIT/ML IV SOLN
500.0000 [IU] | Freq: Once | INTRAVENOUS | Status: DC | PRN
  Filled 2015-03-13: qty 5

## 2015-03-13 MED ORDER — ATROPINE SULFATE 1 MG/ML IJ SOLN
0.5000 mg | Freq: Once | INTRAMUSCULAR | Status: AC | PRN
  Administered 2015-03-13: 0.5 mg via INTRAVENOUS

## 2015-03-13 MED ORDER — SODIUM CHLORIDE 0.9 % IV SOLN
Freq: Once | INTRAVENOUS | Status: AC
  Administered 2015-03-13: 12:00:00 via INTRAVENOUS

## 2015-03-13 MED ORDER — SODIUM CHLORIDE 0.9 % IJ SOLN
10.0000 mL | INTRAMUSCULAR | Status: DC | PRN
  Filled 2015-03-13: qty 10

## 2015-03-13 MED ORDER — SODIUM CHLORIDE 0.9 % IV SOLN
400.0000 mg/m2 | Freq: Once | INTRAVENOUS | Status: AC
  Administered 2015-03-13: 848 mg via INTRAVENOUS
  Filled 2015-03-13: qty 42.4

## 2015-03-13 NOTE — Patient Instructions (Signed)
[facility] Discharge Instructions for Patients Receiving Chemotherapy  Today you received the following chemotherapy agents Irinotecan, Leucovorin, and 5FU  To help prevent nausea and vomiting after your treatment, we encourage you to take your nausea medication Zofran 8 mg every 12 hours as needed   If you develop nausea and vomiting that is not controlled by your nausea medication, call the clinic.   BELOW ARE SYMPTOMS THAT SHOULD BE REPORTED IMMEDIATELY:  *FEVER GREATER THAN 100.5 F  *CHILLS WITH OR WITHOUT FEVER  NAUSEA AND VOMITING THAT IS NOT CONTROLLED WITH YOUR NAUSEA MEDICATION  *UNUSUAL SHORTNESS OF BREATH  *UNUSUAL BRUISING OR BLEEDING  TENDERNESS IN MOUTH AND THROAT WITH OR WITHOUT PRESENCE OF ULCERS  *URINARY PROBLEMS  *BOWEL PROBLEMS  UNUSUAL RASH Items with * indicate a potential emergency and should be followed up as soon as possible.  Feel free to call the clinic you have any questions or concerns. The clinic phone number is [phone].  Please show the [facility] at check-in to the Emergency Department and triage nurse.

## 2015-03-13 NOTE — Progress Notes (Signed)
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    08/18/2014 Miscellaneous tumor KRAS mutation (-)   08/18/2014 Pathology Results Liver biopsy showed metastatic adenocarcinoma, consistent with GI primary   08/18/2014 Initial Diagnosis Small bowel cancer   08/18/2014 Procedure small bowel enteroscopy with biopsy by Dr. Benson Evans showed at the proximal jejunum there was evidence of abnormal mucosa and friability, biopsy was obtained.    09/05/2014 Imaging PET hypermetabolic mass involving a small bowel loop with adjacent mesenteric lymphadenopathy, and diffuse liver metastasis and mild Pallor metabolic lymphadenopathy in porta hepatis.   09/06/2014 - 02/01/2015 Chemotherapy mFOLFOX, stopped due to his neuropathy, and earlier mild disease progression on PET    09/07/2014 - 09/11/2014 Hospital Admission He was admitted for fever and GI bleeding. Received 3 units of RBC.   10/26/2014 Imaging Interval significant improvement in the primary small bowel mass, adjacent lymphadenopathy and extensive hepatic metastatic disease. No other new lesions.    02/09/2015 Imaging PET/CT scan showed stable primary malignancy in the proximal jejunum, mild metabolic progression of the 3 residual metabolic liver metastasis. CT  Portion showed decreased size of his liver metastasis.     HISTORY OF PRESENTING ILLNESS:  Tyler Evans 69 y.o. male is here because of a recent diagnosis of small bowel cancer. He  presented to his PCP on 08/17/2014 with complaints of feeling weak and a one month history of a cough. He was found to be anemic and was referred to the emergency department. Hemoglobin was found to be 7.6, MCV 76.6; creatinine was elevated at 1.74. Stool was Hemoccult positive.  CT abdomen/pelvis without contrast on 08/17/2014 showed multiple large masses within the liver, bulky in appearance. The largest measured approximately 5.7 cm. There appeared to be a focal filling defect within the proximal jejunum measuring approximately 2.5 x 1.7 cm. There was mild adjacent jejunal wall thickening. The lung bases were clear.  On 08/18/2014 he underwent a small bowel enteroscopy with biopsy by Dr. Benson Evans. The esophagus and gastric lumen were normal. At the proximal jejunum there was evidence of abnormal mucosa and friability. The pediatric colonoscope was not able to traverse the area. Biopsies were obtained. An ultraslim colonoscope was then utilized. This colonoscope was able to traverse the area of stenosis which measured approximately 3 cm in length. 50% of the lumen was ulcerated. 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 MMR stains are pending.  He was discharged home on 08/19/2014.  CURRENT THERAPY: FOLFIRI with 5FU $Remove'2400mg'LBboBeb$ /m2 over 46 hours and irinotecan $RemoveBefore'180mg'BNUNUChZCHxiP$ /m2, and Panitumumab $RemoveBeforeD'6mg'egNfMreCcmzdzB$ /kg, every 2 weeks, started on 02/27/15   INTERIM HISTORY:  Tyler Evans returns for follow-up and second cycle chemotherapy. He developed severe skin rash 3-4 days after his first cycle chemotherapy, he was quite diffuse on his face, scalp, neck and chest. He was very itching, he tried over-the-counter hydrocortisone, and according to 0.5% hydrocortisone and clindamycin gel last week, it helped some. The skin rash got better  since yesterday, much less itching. There are still some whiteheads on some of the rashes. He tolerated the chemotherapy well, mild constipation and fatigue, no  significant nausea, diarrhea or other symptoms. His energy level and appetite remains moderately well. No fever or chills.  MEDICAL HISTORY:  Past Medical History  Diagnosis Date  . Diabetes mellitus without complication ([place])   . Hypertension   . Small bowel cancer ([place]) 08/18/2014    SURGICAL HISTORY: Past Surgical History  Procedure Laterality Date  . Enteroscopy N/A 08/18/2014    Procedure: ENTEROSCOPY;  Surgeon: Evans Ada, MD;  Location: [facility];  Service: Endoscopy;  Laterality: N/A;    SOCIAL HISTORY: History   Social History  . Marital Status: Married    Spouse Name: N/A  . Number of Children: 2  . Years of Education: N/A   Occupational History  .  retired Ambulance person    Social History Main Topics  . Smoking status: Never Smoker   . Smokeless tobacco: Not on file  . Alcohol Use: No  . Drug Use: Not on file  . Sexual Activity: Not on file   Other Topics Concern  . Not on file   Social History Narrative  He lives in [place]. He is married. He has 2 children both reported to be in good health. No tobacco or alcohol use. He is a retired Ambulance person.  FAMILY HISTORY: Family History  Problem Relation Age of Onset  . Heart failure, Alzheimer's  Mother   . Lung cancer Father   . Kidney cancer Brother     ALLERGIES:  is allergic to penicillins.  MEDICATIONS:  Current Outpatient Prescriptions  Medication Sig Dispense Refill  . atorvastatin (LIPITOR) 20 MG tablet Take 20 mg by mouth daily.    Marland Kitchen glipiZIDE (GLUCOTROL) 10 MG tablet Take 10 mg by mouth 2 (two) times daily before a meal.    . insulin glargine (LANTUS) 100 UNIT/ML injection Inject 40 Units into the skin at bedtime.    . pantoprazole (PROTONIX) 40 MG tablet Take 1 tablet (40 mg total) by mouth daily. 30 tablet 0  . chlorthalidone (HYGROTON) 25 MG tablet Take 25 mg by mouth daily.    Marland Kitchen lisinopril (PRINIVIL,ZESTRIL) 40 MG tablet Take 40 mg by mouth at bedtime.    . metFORMIN  (GLUCOPHAGE) 1000 MG tablet Take 1,000 mg by mouth 2 (two) times daily with a meal.         REVIEW OF SYSTEMS:   Constitutional: Denies fevers, chills or abnormal night sweats; mild fatigue, weight is stable.  Eyes: Denies blurriness of vision, double vision or watery eyes Ears, nose, mouth, throat, and face: Denies mucositis or sore throat Respiratory: One month history of a nonproductive cough;  no shortness of breath Cardiovascular: Denies palpitation, chest discomfort or lower extremity swelling Gastrointestinal:  Denies nausea, heartburn. No dysphagia. He has noted less frequent bowel movements over the past 2 weeks. Does not characterize this as constipation. Skin: Denies abnormal skin rashes Lymphatics: Denies new lymphadenopathy or easy bruising Neurological: Denies numbness, tingling. No extremity weakness Behavioral/Psych: Mood is stable, no new changes  All other systems were reviewed with the patient and are negative.  PHYSICAL EXAMINATION: ECOG PERFORMANCE STATUS: 1 - Symptomatic but completely ambulatory  Filed Vitals:   03/13/15 1036  BP: 125/45  Pulse: 73  Temp: 98.2 F (36.8 C)  Resp: 17   Filed Weights   03/13/15 1036  Weight: 204 lb (92.534 kg)    GENERAL:alert, no distress  and comfortable SKIN: skin color, texture, turgor are normal, (+) diffuse acne-like rash on his face, neck, chest and upper abdomen. Some rashes have Whitehead. No diffuse skin erythema, or signs of infection.  EYES: normal, conjunctiva are pink and non-injected, sclera clear OROPHARYNX:no exudate, no erythema and lips, buccal mucosa, and tongue normal  NECK: supple, thyroid normal size, non-tender, without nodularity LYMPH:  no palpable lymphadenopathy in the cervical, axillary or inguinal regions LUNGS: clear to auscultation and percussion with normal breathing effort HEART: regular rate & rhythm and no murmurs and no lower extremity edema ABDOMEN:abdomen soft, mild tenderness at the  right upper quadrant , no rebound pain, normal bowel sounds Musculoskeletal:no cyanosis of digits and no clubbing  PSYCH: alert & oriented x 3 with fluent speech NEURO: no focal motor/sensory deficits  LABORATORY DATA:  I have reviewed the data as listed CBC Latest Ref Rng 03/13/2015 02/27/2015 02/21/2015  WBC 4.0 - 10.3 10e3/uL 4.7 4.3 2.5(L)  Hemoglobin 13.0 - 17.1 g/dL 10.3(L) 10.6(L) 10.3(L)  Hematocrit 38.4 - 49.9 % 32.4(L) 33.4(L) 32.6(L)  Platelets 140 - 400 10e3/uL 116(L) 119(L) 137(L)    CMP Latest Ref Rng 03/13/2015 02/27/2015 02/21/2015  Glucose 70 - 140 mg/dl 163(H) 93 71  BUN 7.0 - 26.0 mg/dL 10.2 15.2 11.0  Creatinine 0.7 - 1.3 mg/dL 1.2 1.0 1.0  Sodium 136 - 145 mEq/L 144 142 143  Potassium 3.5 - 5.1 mEq/L 4.0 3.7 3.4(L)  Chloride 96 - 112 mmol/L - - -  CO2 22 - 29 mEq/L $Remove'26 23 25  'pDwyPyB$ Calcium 8.4 - 10.4 mg/dL 9.0 9.1 8.9  Total Protein 6.4 - 8.3 g/dL 6.8 6.6 6.6  Total Bilirubin 0.20 - 1.20 mg/dL 0.34 0.48 0.45  Alkaline Phos 40 - 150 U/L 123 106 108  AST 5 - 34 U/L 27 30 32  ALT 0 - 55 U/L $Remo'22 18 18    'zeeqJ$ Pathology report:  Small Intestine Biopsy, jejunal mass 08/18/2014 - INVASIVE ADENOCARCINOMA IN A BACKGROUND OF TUBULOVILLOUS ADENOMA. ADDITIONAL INFORMATION: Mismatch Repair (MMR) Protein Immunohistochemistry (IHC) IHC Expression Result (LIMITED TUMOR): MLH1: Preserved nuclear expression (greater 50% tumor expression) MSH2: Preserved nuclear expression (greater 50% tumor expression) MSH6: Preserved nuclear expression (greater 50% tumor expression) PMS2: Preserved nuclear expression (greater 50% tumor expression) * Internal control demonstrates intact nuclear expression Interpretation: NORMAL There is preserved expression  Diagnosis 08/28/2014  Liver, needle/core biopsy, right lobe - METASTATIC ADENOCARCINOMA   RADIOGRAPHIC STUDIES: I have personally reviewed the radiological images as listed and agreed with the findings in the report.  Nm Pet Image Initial (pi)  Skull Base To Thigh 09/05/2014    IMPRESSION: Hypermetabolic mass involving a small bowel loop with adjacent mesenteric lymphadenopathy in the left abdomen, consistent with known primary small bowel carcinoma. No evidence of small bowel obstruction.  Diffuse liver metastases. Mild hypermetabolic lymphadenopathy in porta hepatis, also suspicious for metastatic disease.  No evidence of metastatic disease within the neck, chest, or pelvis.  Incidental findings including cholelithiasis, diverticulosis, and mildly enlarged prostate.   Electronically Signed   By: Earle Gell M.D.   On: 09/05/2014 17:29   PET 02/09/2015 IMPRESSION: 1. No appreciable metabolic change at the site of the persistently hypermetabolic primary malignancy in the proximal jejunum. 2. Mild metabolic progression of the three residual hypermetabolic liver metastases. 3. No new sites of hypermetabolic metastatic disease. 4. Worsening near complete left sphenoid sinus opacification. Enlarging mucous retention cyst in the right maxillary sinus. 5. Cholelithiasis.    ASSESSMENT & PLAN:  69 year old gentleman  with past medical history of diabetes, hypertension, who presents with anemia and weakness.  1.Small bowel cancer metastatic to the liver and abdominal nodes, MMR normal, KRAS/NRAS wild type -I reviewed his imaging findings, jejunum biopsy and liver biopsy results with patient and his wife extensively.  -Unfortunately he has stage IV disease with diffuse liver metastasis, this is an incurable disease, with overall very poor prognosis. -he hasn't had excellent response to first-line FOLFOX, however his neuropathy has been getting worse, G2 now, progress to even with reduced dose of oxaliplatin, so I recommend him to change to second line therapy.  -I discussed the recent PET/CT findings with pt and reviewed all images with them in person. He does have slightly increased hypermetabolic activity in his liver lesions, however the CT  portion of the scan showed significant decrease in the size of the liver metastasis. I spoke with radiology, and they will addendum the report. His CEA level are also stable and remains to be low, I think overall he is still responding to treatment, but possible early resistant clones are developing. -given his overall good performance status, his chemo was changed to FOLFIRI and panitumumab. He tolerated the treatment well, except severe skin rash. I'll hold Panitumumab with this cycle chemotherapy. -Lab reviewed, he has mild thrombocytopenia, will proceed cycle 2 FOFIRI today  -We discussed that he may need surgery or radiation if he has recurrent severe GI bleeding. He agrees. Hopefully the chemotherapy will control his cancer, and prevent further bleeding episodes.   2. Microcytic anemia secondary to GI bleeding and iron deficiency -His serum iron level and saturation were low, although ferritin is normal, he has some degree of iron deficient anemia, and anemia of malignancy  -Consider blood transfusion if hemoglobin less than 8 or symptomatic anemia -he received IV feraheme 510 mg twice in 09/2014, repeated iron study was normal in 02/2015  -His hemoglobin is 10.3, stable  3. Thrombocytopenia -secondary to chemotherapy, continue monitoring  4. Hypertension and DM  -He will continue follow-up with his primary care physician. -We discussed that we will monitor his blood pressure and blood sugar closely and may need to adjust his medication during the chemotherapy   5. CKD, stage III -stable, Cr 1.2 today -I encouraged him to drink adequately and avoid dehydration.   Plan: -cycle 2 FOLFIRI, today, no neulasta on day 3  -hold Panitumumab today due to skin rash  -I will see him back in 2 weeks  Truitt Merle  03/13/2015

## 2015-03-15 ENCOUNTER — Ambulatory Visit (HOSPITAL_BASED_OUTPATIENT_CLINIC_OR_DEPARTMENT_OTHER): Payer: Medicare Other

## 2015-03-15 VITALS — BP 126/52 | HR 63 | Temp 97.1°F | Resp 18

## 2015-03-15 DIAGNOSIS — C179 Malignant neoplasm of small intestine, unspecified: Secondary | ICD-10-CM | POA: Diagnosis not present

## 2015-03-15 DIAGNOSIS — C787 Secondary malignant neoplasm of liver and intrahepatic bile duct: Secondary | ICD-10-CM | POA: Diagnosis not present

## 2015-03-15 DIAGNOSIS — C772 Secondary and unspecified malignant neoplasm of intra-abdominal lymph nodes: Secondary | ICD-10-CM

## 2015-03-15 DIAGNOSIS — Z452 Encounter for adjustment and management of vascular access device: Secondary | ICD-10-CM

## 2015-03-15 MED ORDER — SODIUM CHLORIDE 0.9 % IJ SOLN
10.0000 mL | INTRAMUSCULAR | Status: DC | PRN
  Administered 2015-03-15: 10 mL
  Filled 2015-03-15: qty 10

## 2015-03-15 MED ORDER — HEPARIN SOD (PORK) LOCK FLUSH 100 UNIT/ML IV SOLN
500.0000 [IU] | Freq: Once | INTRAVENOUS | Status: AC | PRN
  Administered 2015-03-15: 500 [IU]
  Filled 2015-03-15: qty 5

## 2015-03-21 ENCOUNTER — Ambulatory Visit: Payer: Medicare Other | Admitting: Hematology

## 2015-03-21 ENCOUNTER — Other Ambulatory Visit: Payer: Medicare Other

## 2015-03-22 ENCOUNTER — Ambulatory Visit: Payer: Medicare Other

## 2015-03-27 ENCOUNTER — Encounter: Payer: Self-pay | Admitting: Hematology

## 2015-03-27 ENCOUNTER — Other Ambulatory Visit (HOSPITAL_BASED_OUTPATIENT_CLINIC_OR_DEPARTMENT_OTHER): Payer: Medicare Other

## 2015-03-27 ENCOUNTER — Ambulatory Visit: Payer: Medicare Other

## 2015-03-27 ENCOUNTER — Telehealth: Payer: Self-pay | Admitting: Hematology

## 2015-03-27 ENCOUNTER — Ambulatory Visit (HOSPITAL_BASED_OUTPATIENT_CLINIC_OR_DEPARTMENT_OTHER): Payer: Medicare Other | Admitting: Hematology

## 2015-03-27 VITALS — BP 117/48 | HR 76 | Temp 97.9°F | Resp 20 | Ht 66.0 in | Wt 199.1 lb

## 2015-03-27 DIAGNOSIS — N183 Chronic kidney disease, stage 3 (moderate): Secondary | ICD-10-CM

## 2015-03-27 DIAGNOSIS — D500 Iron deficiency anemia secondary to blood loss (chronic): Secondary | ICD-10-CM

## 2015-03-27 DIAGNOSIS — D709 Neutropenia, unspecified: Secondary | ICD-10-CM

## 2015-03-27 DIAGNOSIS — I10 Essential (primary) hypertension: Secondary | ICD-10-CM

## 2015-03-27 DIAGNOSIS — G629 Polyneuropathy, unspecified: Secondary | ICD-10-CM

## 2015-03-27 DIAGNOSIS — C787 Secondary malignant neoplasm of liver and intrahepatic bile duct: Secondary | ICD-10-CM

## 2015-03-27 DIAGNOSIS — E119 Type 2 diabetes mellitus without complications: Secondary | ICD-10-CM

## 2015-03-27 DIAGNOSIS — C179 Malignant neoplasm of small intestine, unspecified: Secondary | ICD-10-CM | POA: Diagnosis not present

## 2015-03-27 DIAGNOSIS — R5383 Other fatigue: Secondary | ICD-10-CM

## 2015-03-27 DIAGNOSIS — R630 Anorexia: Secondary | ICD-10-CM

## 2015-03-27 DIAGNOSIS — C772 Secondary and unspecified malignant neoplasm of intra-abdominal lymph nodes: Secondary | ICD-10-CM

## 2015-03-27 DIAGNOSIS — D509 Iron deficiency anemia, unspecified: Secondary | ICD-10-CM

## 2015-03-27 LAB — CBC & DIFF AND RETIC
BASO%: 0.4 % (ref 0.0–2.0)
BASOS ABS: 0 10*3/uL (ref 0.0–0.1)
EOS%: 0.9 % (ref 0.0–7.0)
Eosinophils Absolute: 0 10*3/uL (ref 0.0–0.5)
HCT: 30.9 % — ABNORMAL LOW (ref 38.4–49.9)
HGB: 9.9 g/dL — ABNORMAL LOW (ref 13.0–17.1)
Immature Retic Fract: 13.3 % — ABNORMAL HIGH (ref 3.00–10.60)
LYMPH#: 1.1 10*3/uL (ref 0.9–3.3)
LYMPH%: 47.1 % (ref 14.0–49.0)
MCH: 29.8 pg (ref 27.2–33.4)
MCHC: 32 g/dL (ref 32.0–36.0)
MCV: 93.1 fL (ref 79.3–98.0)
MONO#: 0.5 10*3/uL (ref 0.1–0.9)
MONO%: 23.3 % — ABNORMAL HIGH (ref 0.0–14.0)
NEUT#: 0.6 10*3/uL — ABNORMAL LOW (ref 1.5–6.5)
NEUT%: 28.3 % — AB (ref 39.0–75.0)
PLATELETS: 142 10*3/uL (ref 140–400)
RBC: 3.32 10*6/uL — AB (ref 4.20–5.82)
RDW: 15.3 % — ABNORMAL HIGH (ref 11.0–14.6)
RETIC CT ABS: 38.84 10*3/uL (ref 34.80–93.90)
Retic %: 1.17 % (ref 0.80–1.80)
WBC: 2.2 10*3/uL — ABNORMAL LOW (ref 4.0–10.3)

## 2015-03-27 LAB — COMPREHENSIVE METABOLIC PANEL (CC13)
ALBUMIN: 2.9 g/dL — AB (ref 3.5–5.0)
ALK PHOS: 84 U/L (ref 40–150)
ALT: 18 U/L (ref 0–55)
ANION GAP: 8 meq/L (ref 3–11)
AST: 27 U/L (ref 5–34)
BUN: 13.7 mg/dL (ref 7.0–26.0)
CO2: 25 meq/L (ref 22–29)
Calcium: 9 mg/dL (ref 8.4–10.4)
Chloride: 108 mEq/L (ref 98–109)
Creatinine: 1.2 mg/dL (ref 0.7–1.3)
EGFR: 63 mL/min/{1.73_m2} — AB (ref >90)
GLUCOSE: 171 mg/dL — AB (ref 70–140)
POTASSIUM: 4.1 meq/L (ref 3.5–5.1)
SODIUM: 140 meq/L (ref 136–145)
Total Bilirubin: 0.45 mg/dL (ref 0.20–1.20)
Total Protein: 6.5 g/dL (ref 6.4–8.3)

## 2015-03-27 LAB — CEA: CEA: 5 ng/mL (ref 0.0–5.0)

## 2015-03-27 MED ORDER — GABAPENTIN 100 MG PO CAPS: 100.0000 mg | ORAL_CAPSULE | Freq: Three times a day (TID) | ORAL | Status: DC

## 2015-03-27 NOTE — Telephone Encounter (Signed)
Gave adn printed appt sched and avs for pt for OCT and NOV °

## 2015-03-27 NOTE — Progress Notes (Signed)
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    08/18/2014 Miscellaneous tumor KRAS mutation (-)   08/18/2014 Pathology Results Liver biopsy showed metastatic adenocarcinoma, consistent with GI primary   08/18/2014 Initial Diagnosis Small bowel cancer   08/18/2014 Procedure small bowel enteroscopy with biopsy by Dr. Benson Norway showed at the proximal jejunum there was evidence of abnormal mucosa and friability, biopsy was obtained.    09/05/2014 Imaging PET hypermetabolic mass involving a small bowel loop with adjacent mesenteric lymphadenopathy, and diffuse liver metastasis and mild Pallor metabolic lymphadenopathy in porta hepatis.   09/06/2014 - 02/01/2015 Chemotherapy mFOLFOX, stopped due to his neuropathy, and earlier mild disease progression on PET    09/07/2014 - 09/11/2014 Hospital Admission He was admitted for fever and GI bleeding. Received 3 units of RBC.   10/26/2014 Imaging Interval significant improvement in the primary small bowel mass, adjacent lymphadenopathy and extensive hepatic metastatic disease. No other new lesions.    02/09/2015 Imaging PET/CT scan showed stable primary malignancy in the proximal jejunum, mild metabolic progression of the 3 residual metabolic liver metastasis. CT  Portion showed decreased size of his liver metastasis.   02/27/2015 -  Chemotherapy second line chemo FOLFIRI, every 2 weeks, and panitumumab (held for second cycle due to severe skin rashes)       HISTORY OF PRESENTING ILLNESS:  Tyler Evans 69 y.o. male is here because of a recent diagnosis of small bowel cancer. He presented to his PCP on 08/17/2014 with complaints of feeling weak and a one month history of a cough. He was found to be anemic and was referred to the emergency department. Hemoglobin was found to be 7.6, MCV 76.6; creatinine was elevated at 1.74. Stool was Hemoccult positive.  CT abdomen/pelvis without contrast on 08/17/2014 showed multiple large masses within the liver, bulky in appearance. The largest measured approximately 5.7 cm. There appeared to be a focal filling defect within the proximal jejunum measuring approximately 2.5 x 1.7 cm. There was mild adjacent jejunal wall thickening. The lung bases were clear.  On 08/18/2014 he underwent a small bowel enteroscopy with biopsy by Dr. Benson Norway. The esophagus and gastric lumen were normal. At the proximal jejunum there was evidence of abnormal mucosa and friability. The pediatric colonoscope was not able to traverse the area. Biopsies were obtained. An ultraslim colonoscope was then utilized. This colonoscope was able to traverse the area of stenosis which measured approximately 3 cm in length. 50% of the lumen was ulcerated. 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 MMR stains are pending.  He was discharged home on 08/19/2014.  CURRENT THERAPY: FOLFIRI with 5FU 2457m/m2 over 46 hours and irinotecan 1850mm2, and Panitumumab 50m31mg, every 2 weeks, started on 02/27/15, panitumumab held since cycle 2, due to sever skin rashes.    INTERIM HISTORY:  Tyler Evans for follow-up. He tolerated the second cycle FOLFIRI moderately well, he had some constipation, low appetite, and moderate fatigue.  He has recovered some, but still feels weak. His skin rash has much improved, but not resolved. His neuropathy are slightly worse, his fingers are pretty numb, he cannot pick up his most  things. His some difficulty with writing and buttoning. He has some some tingling and sensitivity on the fingertips. He is afraid to fall due to the numbness on his feet. No significant weakness. He lost about 5 pounds since 2 weeks ago.   MEDICAL HISTORY:  Past Medical History  Diagnosis Date  . Diabetes mellitus without complication (HCC)   . Hypertension   . Small bowel cancer (HCC) 08/18/2014    SURGICAL HISTORY: Past Surgical History  Procedure Laterality Date  . Enteroscopy N/A 08/18/2014    Procedure: ENTEROSCOPY;  Surgeon: Patrick Hung, MD;  Location: [facility];  Service: Endoscopy;  Laterality: N/A;    SOCIAL HISTORY: History   Social History  . Marital Status: Married    Spouse Name: N/A  . Number of Children: 2  . Years of Education: N/A   Occupational History  .  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 Smoking status: Never Smoker   . Smokeless tobacco: Not on file  . Alcohol Use: No  . Drug Use: Not on file  . Sexual Activity: Not on file   Other Topics Concern  . Not on file   Social History Narrative  He lives in [place]. He is married. He has 2 children both reported to be in good health. No tobacco or alcohol use. 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  FAMILY HISTORY: Family History  Problem Relation Age of Onset  . Heart failure, Alzheimer's  Mother   . Lung cancer Father   . Kidney cancer Brother     ALLERGIES:  is allergic to penicillins.  MEDICATIONS:  Current Outpatient Prescriptions  Medication Sig Dispense Refill  . atorvastatin (LIPITOR) 20 MG tablet Take 20 mg by mouth daily.    . glipiZIDE (GLUCOTROL) 10 MG tablet Take 10 mg by mouth 2 (two) times daily before a meal.    . insulin glargine (LANTUS) 100 UNIT/ML injection Inject 40 Units into the skin at bedtime.    . pantoprazole (PROTONIX) 40 MG tablet Take 1 tablet (40 mg total) by mouth daily. 30 tablet 0  . chlorthalidone (HYGROTON) 25 MG tablet Take 25 mg by  mouth daily.    . lisinopril (PRINIVIL,ZESTRIL) 40 MG tablet Take 40 mg by mouth at bedtime.    . metFORMIN (GLUCOPHAGE) 1000 MG tablet Take 1,000 mg by mouth 2 (two) times daily with a meal.         REVIEW OF SYSTEMS:   Constitutional: Denies fevers, chills or abnormal night sweats; mild fatigue, weight is stable.  Eyes: Denies blurriness of vision, double vision or watery eyes Ears, nose, mouth, throat, and face: Denies mucositis or sore throat Respiratory: One month history of a nonproductive cough;  no shortness of breath Cardiovascular: Denies palpitation, chest discomfort or lower extremity swelling Gastrointestinal:  Denies nausea, heartburn. No dysphagia. He has noted less frequent bowel movements over the past 2 weeks. Does not characterize this as constipation. Skin: Denies abnormal skin rashes Lymphatics: Denies new lymphadenopathy or easy bruising Neurological: Denies numbness, tingling. No extremity weakness Behavioral/Psych: Mood is stable, no new changes  All other systems were reviewed with the patient and are negative.  PHYSICAL EXAMINATION: ECOG PERFORMANCE STATUS: 2  Filed Vitals:   03/27/15 0929  BP: 117/48  Pulse: 76    Temp: 97.9 F (36.6 C)  Resp: 20   Filed Weights   03/27/15 0929  Weight: 199 lb 1.6 oz (90.311 kg)    GENERAL:alert, no distress and comfortable SKIN: skin color, texture, turgor are normal, (+) diffuse acne-like rash on his face, neck, chest and upper abdomen. Some rashes have [place]. No diffuse skin erythema, or signs of infection.  EYES: normal, conjunctiva are pink and non-injected, sclera clear OROPHARYNX:no exudate, no erythema and lips, buccal mucosa, and tongue normal  NECK: supple, thyroid normal size, non-tender, without nodularity LYMPH:  no palpable lymphadenopathy in the cervical, axillary or inguinal regions LUNGS: clear to auscultation and percussion with normal breathing effort HEART: regular rate & rhythm and no  murmurs and no lower extremity edema ABDOMEN:abdomen soft, mild tenderness at the right upper quadrant , no rebound pain, normal bowel sounds Musculoskeletal:no cyanosis of digits and no clubbing  PSYCH: alert & oriented x 3 with fluent speech NEURO: no focal motor deficits, his light touch sensation and vibration sensation are diminished on his hands and feet.   LABORATORY DATA:  I have reviewed the data as listed CBC Latest Ref Rng 03/27/2015 03/13/2015 02/27/2015  WBC 4.0 - 10.3 10e3/uL 2.2(L) 4.7 4.3  Hemoglobin 13.0 - 17.1 g/dL 9.9(L) 10.3(L) 10.6(L)  Hematocrit 38.4 - 49.9 % 30.9(L) 32.4(L) 33.4(L)  Platelets 140 - 400 10e3/uL 142 116(L) 119(L)    CMP Latest Ref Rng 03/27/2015 03/13/2015 02/27/2015  Glucose 70 - 140 mg/dl 171(H) 163(H) 93  BUN 7.0 - 26.0 mg/dL 13.7 10.2 15.2  Creatinine 0.7 - 1.3 mg/dL 1.2 1.2 1.0  Sodium 136 - 145 mEq/L 140 144 142  Potassium 3.5 - 5.1 mEq/L 4.1 4.0 3.7  Chloride 96 - 112 mmol/L - - -  CO2 22 - 29 mEq/L 25 26 23  Calcium 8.4 - 10.4 mg/dL 9.0 9.0 9.1  Total Protein 6.4 - 8.3 g/dL 6.5 6.8 6.6  Total Bilirubin 0.20 - 1.20 mg/dL 0.45 0.34 0.48  Alkaline Phos 40 - 150 U/L 84 123 106  AST 5 - 34 U/L 27 27 30  ALT 0 - 55 U/L 18 22 18   CEA  Status: Finalresult Visible to patient:  MyChart Nextappt: Today at 09:30 AM in Oncology ([facility]) Dx:  Small bowel cancer (HCC)              Ref Range 4wk ago  1mo ago  2mo ago     CEA 0.0 - 5.0 ng/mL 6.0 (H) 5.5 (H) 6.3 (H)          Pathology report:  Small Intestine Biopsy, jejunal mass 08/18/2014 - INVASIVE ADENOCARCINOMA IN A BACKGROUND OF TUBULOVILLOUS ADENOMA. ADDITIONAL INFORMATION: Mismatch Repair (MMR) Protein Immunohistochemistry (IHC) IHC Expression Result (LIMITED TUMOR): MLH1: Preserved nuclear expression (greater 50% tumor expression) MSH2: Preserved nuclear expression (greater 50% tumor expression) MSH6: Preserved nuclear expression (greater 50%  tumor expression) PMS2: Preserved nuclear expression (greater 50% tumor expression) * Internal control demonstrates intact nuclear expression Interpretation: NORMAL There is preserved expression  Diagnosis 08/28/2014  Liver, needle/core biopsy, right lobe - METASTATIC ADENOCARCINOMA   RADIOGRAPHIC STUDIES: I have personally reviewed the radiological images as listed and agreed with the findings in the report.  Nm Pet Image Initial (pi) Skull Base To Thigh 09/05/2014    IMPRESSION: Hypermetabolic mass involving a small bowel loop with adjacent mesenteric lymphadenopathy in the left abdomen, consistent with known primary small bowel carcinoma. No evidence of small bowel obstruction.  Diffuse liver metastases. Mild hypermetabolic lymphadenopathy   in porta hepatis, also suspicious for metastatic disease.  No evidence of metastatic disease within the neck, chest, or pelvis.  Incidental findings including cholelithiasis, diverticulosis, and mildly enlarged prostate.   Electronically Signed   By: John  Stahl M.D.   On: 09/05/2014 17:29   PET 02/09/2015 IMPRESSION: 1. No appreciable metabolic change at the site of the persistently hypermetabolic primary malignancy in the proximal jejunum. 2. Mild metabolic progression of the three residual hypermetabolic liver metastases. 3. No new sites of hypermetabolic metastatic disease. 4. Worsening near complete left sphenoid sinus opacification. Enlarging mucous retention cyst in the right maxillary sinus. 5. Cholelithiasis.    ASSESSMENT & PLAN:  69-year-old gentleman with past medical history of diabetes, hypertension, who presents with anemia and weakness.  1.Small bowel cancer metastatic to the liver and abdominal nodes, MMR normal, KRAS/NRAS wild type -I reviewed his imaging findings, jejunum biopsy and liver biopsy results with patient and his wife extensively.  -Unfortunately he has stage IV disease with diffuse liver metastasis, this is an  incurable disease, with overall very poor prognosis. -he has had excellent response to first-line FOLFOX, however his neuropathy has been getting worse, G2 now, so I recommend him to change to second line therapy.  -I discussed the recent PET/CT findings with pt and reviewed all images with them in person. He does have slightly increased hypermetabolic activity in his liver lesions, however the CT portion of the scan showed significant decrease in the size of the liver metastasis. I spoke with radiology, and they will addendum the report. His CEA level are also stable and remains to be low, I think overall he is still responding to treatment, but possible early resistant clones are developing. -given his overall good performance status, his chemo was changed to FOLFIRI and panitumumab. He developed severe skin rash. Panitumumab was subsequently held. -Lab reviewed, he is still neutropenic, we'll postpone his chemotherapy. -He feels quite fatigued, and wish to take a chemotherapy break for 2 weeks, which I think is reasonable.  2. Microcytic anemia secondary to GI bleeding and iron deficiency -His serum iron level and saturation were low, although ferritin is normal, he has some degree of iron deficient anemia, and anemia of malignancy  -Consider blood transfusion if hemoglobin less than 8 or symptomatic anemia -he received IV feraheme 510 mg twice in 09/2014, repeated iron study was normal in 02/2015  -His hemoglobin is  9.9, stable  3.  Hypertension and DM  -He will continue follow-up with his primary care physician. -We discussed that we will monitor his blood pressure and blood sugar closely and may need to adjust his medication during the chemotherapy   5. CKD, stage III -stable, Cr 1.2 today -I encouraged him to drink adequately and avoid dehydration.  6. Fatigue, anorexia -second to chemo, he will take a chemo break   Plan: -no chemo today  -I will see him back in 2 weeks and resume  chemo if he recovers well   Feng, Yan  03/27/2015   

## 2015-04-04 ENCOUNTER — Telehealth: Payer: Self-pay | Admitting: Hematology

## 2015-04-04 NOTE — Telephone Encounter (Signed)
per Dr Burr Medico to move MD appt-cld & spoke to pt and r/s appt and gave pt r/s appt time & date

## 2015-04-09 ENCOUNTER — Other Ambulatory Visit (HOSPITAL_BASED_OUTPATIENT_CLINIC_OR_DEPARTMENT_OTHER): Payer: Medicare Other

## 2015-04-09 ENCOUNTER — Telehealth: Payer: Self-pay | Admitting: Hematology

## 2015-04-09 ENCOUNTER — Encounter: Payer: Self-pay | Admitting: Hematology

## 2015-04-09 ENCOUNTER — Ambulatory Visit (HOSPITAL_BASED_OUTPATIENT_CLINIC_OR_DEPARTMENT_OTHER): Payer: Medicare Other | Admitting: Hematology

## 2015-04-09 VITALS — BP 124/56 | HR 61 | Temp 98.0°F | Resp 18 | Ht 66.0 in | Wt 209.7 lb

## 2015-04-09 DIAGNOSIS — C772 Secondary and unspecified malignant neoplasm of intra-abdominal lymph nodes: Secondary | ICD-10-CM

## 2015-04-09 DIAGNOSIS — N183 Chronic kidney disease, stage 3 (moderate): Secondary | ICD-10-CM

## 2015-04-09 DIAGNOSIS — E119 Type 2 diabetes mellitus without complications: Secondary | ICD-10-CM

## 2015-04-09 DIAGNOSIS — D649 Anemia, unspecified: Secondary | ICD-10-CM

## 2015-04-09 DIAGNOSIS — R5383 Other fatigue: Secondary | ICD-10-CM

## 2015-04-09 DIAGNOSIS — G629 Polyneuropathy, unspecified: Secondary | ICD-10-CM

## 2015-04-09 DIAGNOSIS — C787 Secondary malignant neoplasm of liver and intrahepatic bile duct: Secondary | ICD-10-CM

## 2015-04-09 DIAGNOSIS — I10 Essential (primary) hypertension: Secondary | ICD-10-CM

## 2015-04-09 DIAGNOSIS — L270 Generalized skin eruption due to drugs and medicaments taken internally: Secondary | ICD-10-CM | POA: Insufficient documentation

## 2015-04-09 DIAGNOSIS — R21 Rash and other nonspecific skin eruption: Secondary | ICD-10-CM

## 2015-04-09 DIAGNOSIS — D509 Iron deficiency anemia, unspecified: Secondary | ICD-10-CM

## 2015-04-09 DIAGNOSIS — C179 Malignant neoplasm of small intestine, unspecified: Secondary | ICD-10-CM | POA: Diagnosis not present

## 2015-04-09 DIAGNOSIS — R630 Anorexia: Secondary | ICD-10-CM

## 2015-04-09 DIAGNOSIS — D500 Iron deficiency anemia secondary to blood loss (chronic): Secondary | ICD-10-CM

## 2015-04-09 LAB — CBC & DIFF AND RETIC
BASO%: 0.4 % (ref 0.0–2.0)
BASOS ABS: 0 10*3/uL (ref 0.0–0.1)
EOS ABS: 0.1 10*3/uL (ref 0.0–0.5)
EOS%: 1.5 % (ref 0.0–7.0)
HEMATOCRIT: 30.8 % — AB (ref 38.4–49.9)
HEMOGLOBIN: 9.6 g/dL — AB (ref 13.0–17.1)
IMMATURE RETIC FRACT: 13.8 % — AB (ref 3.00–10.60)
LYMPH#: 1.6 10*3/uL (ref 0.9–3.3)
LYMPH%: 33.8 % (ref 14.0–49.0)
MCH: 29.4 pg (ref 27.2–33.4)
MCHC: 31.2 g/dL — AB (ref 32.0–36.0)
MCV: 94.5 fL (ref 79.3–98.0)
MONO#: 0.6 10*3/uL (ref 0.1–0.9)
MONO%: 12.2 % (ref 0.0–14.0)
NEUT#: 2.4 10*3/uL (ref 1.5–6.5)
NEUT%: 52.1 % (ref 39.0–75.0)
Platelets: 145 10*3/uL (ref 140–400)
RBC: 3.26 10*6/uL — ABNORMAL LOW (ref 4.20–5.82)
RDW: 15.9 % — AB (ref 11.0–14.6)
RETIC %: 1.28 % (ref 0.80–1.80)
RETIC CT ABS: 41.73 10*3/uL (ref 34.80–93.90)
WBC: 4.6 10*3/uL (ref 4.0–10.3)

## 2015-04-09 LAB — COMPREHENSIVE METABOLIC PANEL (CC13)
ALT: 18 U/L (ref 0–55)
AST: 29 U/L (ref 5–34)
Albumin: 2.8 g/dL — ABNORMAL LOW (ref 3.5–5.0)
Alkaline Phosphatase: 77 U/L (ref 40–150)
Anion Gap: 7 mEq/L (ref 3–11)
BUN: 11.4 mg/dL (ref 7.0–26.0)
CALCIUM: 8.9 mg/dL (ref 8.4–10.4)
CHLORIDE: 111 meq/L — AB (ref 98–109)
CO2: 26 mEq/L (ref 22–29)
CREATININE: 1 mg/dL (ref 0.7–1.3)
EGFR: 74 mL/min/{1.73_m2} — ABNORMAL LOW (ref >90)
GLUCOSE: 75 mg/dL (ref 70–140)
POTASSIUM: 3.7 meq/L (ref 3.5–5.1)
SODIUM: 144 meq/L (ref 136–145)
Total Bilirubin: 0.44 mg/dL (ref 0.20–1.20)
Total Protein: 6.2 g/dL — ABNORMAL LOW (ref 6.4–8.3)

## 2015-04-09 NOTE — Progress Notes (Signed)
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    08/18/2014 Miscellaneous tumor KRAS mutation (-)   08/18/2014 Pathology Results Liver biopsy showed metastatic adenocarcinoma, consistent with GI primary   08/18/2014 Initial Diagnosis Small bowel cancer   08/18/2014 Procedure small bowel enteroscopy with biopsy by Dr. Benson Norway showed at the proximal jejunum there was evidence of abnormal mucosa and friability, biopsy was obtained.    09/05/2014 Imaging PET hypermetabolic mass involving a small bowel loop with adjacent mesenteric lymphadenopathy, and diffuse liver metastasis and mild Pallor metabolic lymphadenopathy in porta hepatis.   09/06/2014 - 02/01/2015 Chemotherapy mFOLFOX, stopped due to his neuropathy, and earlier mild disease progression on PET    09/07/2014 - 09/11/2014 Hospital Admission He was admitted for fever and GI bleeding. Received 3 units of RBC.   10/26/2014 Imaging Interval significant improvement in the primary small bowel mass, adjacent lymphadenopathy and extensive hepatic metastatic disease. No other new lesions.    02/09/2015 Imaging PET/CT scan showed stable primary malignancy in the proximal jejunum, mild metabolic progression of the 3 residual metabolic liver metastasis. CT  Portion showed decreased size of his liver metastasis.   02/27/2015 -  Chemotherapy second line chemo FOLFIRI, every 2 weeks, and panitumumab (held for second cycle due to severe skin rashes)       HISTORY OF PRESENTING ILLNESS:  Tyler Evans 69 y.o. male is here because of a recent diagnosis of small bowel cancer. He presented to his PCP on 08/17/2014 with complaints of feeling weak and a one month history of a cough. He was found to be anemic and was referred to the emergency department. Hemoglobin was found to be 7.6, MCV 76.6; creatinine was elevated at 1.74. Stool was Hemoccult positive.  CT abdomen/pelvis without contrast on 08/17/2014 showed multiple large masses within the liver, bulky in appearance. The largest measured approximately 5.7 cm. There appeared to be a focal filling defect within the proximal jejunum measuring approximately 2.5 x 1.7 cm. There was mild adjacent jejunal wall thickening. The lung bases were clear.  On 08/18/2014 he underwent a small bowel enteroscopy with biopsy by Dr. Benson Norway. The esophagus and gastric lumen were normal. At the proximal jejunum there was evidence of abnormal mucosa and friability. The pediatric colonoscope was not able to traverse the area. Biopsies were obtained. An ultraslim colonoscope was then utilized. This colonoscope was able to traverse the area of stenosis which measured approximately 3 cm in length. 50% of the lumen was ulcerated. 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 MMR stains are pending.  He was discharged home on 08/19/2014.  CURRENT THERAPY: FOLFIRI with 5FU $Remove'2400mg'SokEvWJ$ /m2 over 46 hours and irinotecan $RemoveBefore'180mg'VYFyaRKGoZYNf$ /m2, and Panitumumab $RemoveBeforeD'6mg'dkQQJkMbOCccSD$ /kg, every 2 weeks, started on 02/27/15, panitumumab held since cycle 2, due to sever skin rashes.    INTERIM HISTORY:  Mr. Fomby returns for follow-up. He skipped the chemotherapy 2 weeks ago, and his fatigue has improved. He was able to do  more scarred and work and tolerate more activities. His rash has improved also. Slightly H on the back no other discomfort from his rash. His neuropathy is stable, no new complaints.   MEDICAL HISTORY:  Past  Medical History  Diagnosis Date  . Diabetes mellitus without complication ([place])   . Hypertension   . Small bowel cancer ([place]) 08/18/2014    SURGICAL HISTORY: Past Surgical History  Procedure Laterality Date  . Enteroscopy N/A 08/18/2014    Procedure: ENTEROSCOPY;  Surgeon: Carol Ada, MD;  Location: [facility];  Service: Endoscopy;  Laterality: N/A;    SOCIAL HISTORY: History   Social History  . Marital Status: Married    Spouse Name: N/A  . Number of Children: 2  . Years of Education: N/A   Occupational History  .  retired Ambulance person    Social History Main Topics  . Smoking status: Never Smoker   . Smokeless tobacco: Not on file  . Alcohol Use: No  . Drug Use: Not on file  . Sexual Activity: Not on file   Other Topics Concern  . Not on file   Social History Narrative  He lives in [place]. He is married. He has 2 children both reported to be in good health. No tobacco or alcohol use. He is a retired Ambulance person.  FAMILY HISTORY: Family History  Problem Relation Age of Onset  . Heart failure, Alzheimer's  Mother   . Lung cancer Father   . Kidney cancer Brother     ALLERGIES:  is allergic to penicillins.  MEDICATIONS:  Current Outpatient Prescriptions  Medication Sig Dispense Refill  . atorvastatin (LIPITOR) 20 MG tablet Take 20 mg by mouth daily.    Marland Kitchen glipiZIDE (GLUCOTROL) 10 MG tablet Take 10 mg by mouth 2 (two) times daily before a meal.    . insulin glargine (LANTUS) 100 UNIT/ML injection Inject 40 Units into the skin at bedtime.    . pantoprazole (PROTONIX) 40 MG tablet Take 1 tablet (40 mg total) by mouth daily. 30 tablet 0  . chlorthalidone (HYGROTON) 25 MG tablet Take 25 mg by mouth daily.    Marland Kitchen lisinopril (PRINIVIL,ZESTRIL) 40 MG tablet Take 40 mg by mouth at bedtime.    . metFORMIN (GLUCOPHAGE) 1000 MG tablet Take 1,000 mg by mouth 2 (two) times daily with a meal.         REVIEW OF SYSTEMS:   Constitutional: Denies fevers, chills or  abnormal night sweats; mild fatigue, weight is stable.  Eyes: Denies blurriness of vision, double vision or watery eyes Ears, nose, mouth, throat, and face: Denies mucositis or sore throat Respiratory: One month history of a nonproductive cough;  no shortness of breath Cardiovascular: Denies palpitation, chest discomfort or lower extremity swelling Gastrointestinal:  Denies nausea, heartburn. No dysphagia. He has noted less frequent bowel movements over the past 2 weeks. Does not characterize this as constipation. Skin: Denies abnormal skin rashes Lymphatics: Denies new lymphadenopathy or easy bruising Neurological: Denies numbness, tingling. No extremity weakness Behavioral/Psych: Mood is stable, no new changes  All other systems were reviewed with the patient and are negative.  PHYSICAL EXAMINATION: ECOG PERFORMANCE STATUS: 1  Filed Vitals:   04/09/15 0845  BP: 124/56  Pulse: 61  Temp: 98 F (36.7 C)  Resp: 18   Filed Weights   04/09/15 0845  Weight: 209 lb 11.2 oz (95.119 kg)    GENERAL:alert, no distress and comfortable SKIN: skin color, texture, turgor are normal, (+) diffuse acne-like rash on  his face, neck, chest and upper abdomen. The rash on the face are healing well, left overall with pigmentations. No diffuse skin erythema, or signs of infection.  EYES: normal, conjunctiva are pink and non-injected, sclera clear OROPHARYNX:no exudate, no erythema and lips, buccal mucosa, and tongue normal  NECK: supple, thyroid normal size, non-tender, without nodularity LYMPH:  no palpable lymphadenopathy in the cervical, axillary or inguinal regions LUNGS: clear to auscultation and percussion with normal breathing effort HEART: regular rate & rhythm and no murmurs and no lower extremity edema ABDOMEN:abdomen soft, mild tenderness at the right upper quadrant , no rebound pain, normal bowel sounds Musculoskeletal:no cyanosis of digits and no clubbing  PSYCH: alert & oriented x 3 with  fluent speech NEURO: no focal motor deficits, his light touch sensation and vibration sensation are diminished on his hands and feet.   LABORATORY DATA:  I have reviewed the data as listed CBC Latest Ref Rng 04/09/2015 03/27/2015 03/13/2015  WBC 4.0 - 10.3 10e3/uL 4.6 2.2(L) 4.7  Hemoglobin 13.0 - 17.1 g/dL 9.6(L) 9.9(L) 10.3(L)  Hematocrit 38.4 - 49.9 % 30.8(L) 30.9(L) 32.4(L)  Platelets 140 - 400 10e3/uL 145 142 116(L)    CMP Latest Ref Rng 04/09/2015 03/27/2015 03/13/2015  Glucose 70 - 140 mg/dl 75 171(H) 163(H)  BUN 7.0 - 26.0 mg/dL 11.4 13.7 10.2  Creatinine 0.7 - 1.3 mg/dL 1.0 1.2 1.2  Sodium 136 - 145 mEq/L 144 140 144  Potassium 3.5 - 5.1 mEq/L 3.7 4.1 4.0  Chloride 96 - 112 mmol/L - - -  CO2 22 - 29 mEq/L $Remove'26 25 26  'vJQgyTD$ Calcium 8.4 - 10.4 mg/dL 8.9 9.0 9.0  Total Protein 6.4 - 8.3 g/dL 6.2(L) 6.5 6.8  Total Bilirubin 0.20 - 1.20 mg/dL 0.44 0.45 0.34  Alkaline Phos 40 - 150 U/L 77 84 123  AST 5 - 34 U/L $Remo'29 27 27  'uqaty$ ALT 0 - 55 U/L $Remo'18 18 22   'Ejply$ CEA  Status: Finalresult Visible to patient:  MyChart Nextappt: Today at 09:15 AM in Oncology [facility] Procedure 2) Dx:  Small bowel cancer ([place])           Ref Range 2wk ago  11mo ago  39mo ago  38mo ago     CEA 0.0 - 5.0 ng/mL 5.0 6.0 (H) 5.5 (H) 6.3 (H)         Pathology report:  Small Intestine Biopsy, jejunal mass 08/18/2014 - INVASIVE ADENOCARCINOMA IN A BACKGROUND OF TUBULOVILLOUS ADENOMA. ADDITIONAL INFORMATION: Mismatch Repair (MMR) Protein Immunohistochemistry (IHC) IHC Expression Result (LIMITED TUMOR): MLH1: Preserved nuclear expression (greater 50% tumor expression) MSH2: Preserved nuclear expression (greater 50% tumor expression) MSH6: Preserved nuclear expression (greater 50% tumor expression) PMS2: Preserved nuclear expression (greater 50% tumor expression) * Internal control demonstrates intact nuclear expression Interpretation: NORMAL There is preserved expression  Diagnosis 08/28/2014   Liver, needle/core biopsy, right lobe - METASTATIC ADENOCARCINOMA   RADIOGRAPHIC STUDIES: I have personally reviewed the radiological images as listed and agreed with the findings in the report.  Nm Pet Image Initial (pi) Skull Base To Thigh 09/05/2014    IMPRESSION: Hypermetabolic mass involving a small bowel loop with adjacent mesenteric lymphadenopathy in the left abdomen, consistent with known primary small bowel carcinoma. No evidence of small bowel obstruction.  Diffuse liver metastases. Mild hypermetabolic lymphadenopathy in porta hepatis, also suspicious for metastatic disease.  No evidence of metastatic disease within the neck, chest, or pelvis.  Incidental findings including cholelithiasis, diverticulosis, and mildly enlarged prostate.   Electronically Signed  By: Earle Gell M.D.   On: 09/05/2014 17:29   PET 02/09/2015 IMPRESSION: 1. No appreciable metabolic change at the site of the persistently hypermetabolic primary malignancy in the proximal jejunum. 2. Mild metabolic progression of the three residual hypermetabolic liver metastases. 3. No new sites of hypermetabolic metastatic disease. 4. Worsening near complete left sphenoid sinus opacification. Enlarging mucous retention cyst in the right maxillary sinus. 5. Cholelithiasis.    ASSESSMENT & PLAN:  69 year old gentleman with past medical history of diabetes, hypertension, who presents with anemia and weakness.  1.Small bowel cancer metastatic to the liver and abdominal nodes, MMR normal, KRAS/NRAS wild type -I reviewed his imaging findings, jejunum biopsy and liver biopsy results with patient and his wife extensively.  -Unfortunately he has stage IV disease with diffuse liver metastasis, this is an incurable disease, with overall very poor prognosis. -he has had excellent response to first-line FOLFOX, however his neuropathy has been getting worse, G2 now, so I recommend him to change to second line therapy.  -I discussed  the recent PET/CT findings with pt and reviewed all images with them in person. He does have slightly increased hypermetabolic activity in his liver lesions, however the CT portion of the scan showed significant decrease in the size of the liver metastasis. I spoke with radiology, and they will addendum the report. His CEA level are also stable and remains to be low, I think overall he is still responding to treatment, but possible early resistant clones are developing. -given his overall good performance status, his chemo was changed to FOLFIRI and panitumumab. He developed severe skin rash. Panitumumab was subsequently held. -He has recovered better from his last cycle chemotherapy 4 weeks ago, lab reviewed, adequate for treatment, he will resume chemotherapy tomorrow. -Given the significant neutropenia, I'll reduce his 5-FU dose to 2000 mg/m -we discussed the duration and chemo frequency again, patient asked if he can do chemotherapy every 3 weeks, I told him every 2 weeks is still the standard, if his blood counts recovers. Certainly if he has tolerance issue or cytopenia, we'll adjust his chemotherapy.  2. Microcytic anemia secondary to GI bleeding and iron deficiency -His serum iron level and saturation were low, although ferritin is normal, he has some degree of iron deficient anemia, and anemia of malignancy  -Consider blood transfusion if hemoglobin less than 8 or symptomatic anemia -he received IV feraheme 510 mg twice in 09/2014, repeated iron study was normal in 02/2015  -His hemoglobin is  9.6, stable  3.  Hypertension and DM  -He will continue follow-up with his primary care physician. -We discussed that we will monitor his blood pressure and blood sugar closely and may need to adjust his medication during the chemotherapy   5. CKD, stage III -stable, Cr 1.0 today -I encouraged him to drink adequately and avoid dehydration.  6. Fatigue, anorexia -improved after a 2 week chemo break    7. Skin rash -secondary to panitumumab  -improving, we discussed to restart panitumumab at half dose in early December after the Thanksgiving Holiday    Plan: -cycle 3 FOLFIRI tomorrow, with 5-fu dose reduction to $RemoveBefo'2000mg'tJPVjqWKxvN$ /m2 due to neutropenia, no neulasta with this cycle, continue every 2 weeks   -I will see him back in 4 weeks   Truitt Merle  04/09/2015

## 2015-04-09 NOTE — Telephone Encounter (Signed)
Gave and printed appt sched and avs for pt for NOV and DEc °

## 2015-04-10 ENCOUNTER — Encounter: Payer: Self-pay | Admitting: *Deleted

## 2015-04-10 ENCOUNTER — Other Ambulatory Visit: Payer: Medicare Other

## 2015-04-10 ENCOUNTER — Ambulatory Visit (HOSPITAL_BASED_OUTPATIENT_CLINIC_OR_DEPARTMENT_OTHER): Payer: Medicare Other

## 2015-04-10 ENCOUNTER — Ambulatory Visit: Payer: Medicare Other | Admitting: Hematology

## 2015-04-10 VITALS — BP 147/60 | HR 73 | Temp 98.0°F | Resp 16

## 2015-04-10 DIAGNOSIS — Z5111 Encounter for antineoplastic chemotherapy: Secondary | ICD-10-CM | POA: Diagnosis not present

## 2015-04-10 DIAGNOSIS — C179 Malignant neoplasm of small intestine, unspecified: Secondary | ICD-10-CM | POA: Diagnosis not present

## 2015-04-10 MED ORDER — LEUCOVORIN CALCIUM INJECTION 350 MG
400.0000 mg/m2 | Freq: Once | INTRAVENOUS | Status: AC
  Administered 2015-04-10: 848 mg via INTRAVENOUS
  Filled 2015-04-10: qty 42.4

## 2015-04-10 MED ORDER — ATROPINE SULFATE 1 MG/ML IJ SOLN
0.5000 mg | Freq: Once | INTRAMUSCULAR | Status: AC | PRN
  Administered 2015-04-10: 0.5 mg via INTRAVENOUS

## 2015-04-10 MED ORDER — SODIUM CHLORIDE 0.9 % IV SOLN
Freq: Once | INTRAVENOUS | Status: AC
  Administered 2015-04-10: 09:00:00 via INTRAVENOUS

## 2015-04-10 MED ORDER — FLUOROURACIL CHEMO INJECTION 5 GM/100ML
2000.0000 mg/m2 | INTRAVENOUS | Status: DC
  Administered 2015-04-10: 4250 mg via INTRAVENOUS
  Filled 2015-04-10: qty 85

## 2015-04-10 MED ORDER — IRINOTECAN HCL CHEMO INJECTION 100 MG/5ML
179.0000 mg/m2 | Freq: Once | INTRAVENOUS | Status: AC
  Administered 2015-04-10: 380 mg via INTRAVENOUS
  Filled 2015-04-10: qty 19

## 2015-04-10 MED ORDER — SODIUM CHLORIDE 0.9 % IV SOLN
Freq: Once | INTRAVENOUS | Status: AC
  Administered 2015-04-10: 10:00:00 via INTRAVENOUS
  Filled 2015-04-10: qty 8

## 2015-04-10 MED ORDER — ATROPINE SULFATE 1 MG/ML IJ SOLN
INTRAMUSCULAR | Status: AC
  Filled 2015-04-10: qty 1

## 2015-04-10 NOTE — Progress Notes (Signed)
Oncology Nurse Navigator Documentation  Oncology Nurse Navigator Flowsheets 04/10/2015  Navigator Encounter Type Treatment;6 month  Patient Visit Type Medonc  Treatment Phase Treatment-FOLFIRI/Panitumumab   Barriers/Navigation Needs No barriers at this time  Support Groups/Services GI  Time Spent with Patient 15  Met with patient and wife in tx area. He reports feeling well and spirits are good. On break from Panitumumab due to severe rash. Says this has eased in intensity now. Provided him copy of November group events and invited him to GI group. He has navigator contact information if needed.

## 2015-04-10 NOTE — Patient Instructions (Signed)
[facility] Discharge Instructions for Patients Receiving Chemotherapy  Today you received the following chemotherapy agents irinotecan/leucovorin/fluorouracil   To help prevent nausea and vomiting after your treatment, we encourage you to take your nausea medication as directed   If you develop nausea and vomiting that is not controlled by your nausea medication, call the clinic.   BELOW ARE SYMPTOMS THAT SHOULD BE REPORTED IMMEDIATELY:  *FEVER GREATER THAN 100.5 F  *CHILLS WITH OR WITHOUT FEVER  NAUSEA AND VOMITING THAT IS NOT CONTROLLED WITH YOUR NAUSEA MEDICATION  *UNUSUAL SHORTNESS OF BREATH  *UNUSUAL BRUISING OR BLEEDING  TENDERNESS IN MOUTH AND THROAT WITH OR WITHOUT PRESENCE OF ULCERS  *URINARY PROBLEMS  *BOWEL PROBLEMS  UNUSUAL RASH Items with * indicate a potential emergency and should be followed up as soon as possible.  Feel free to call the clinic you have any questions or concerns. The clinic phone number is [phone].  

## 2015-04-12 ENCOUNTER — Ambulatory Visit (HOSPITAL_BASED_OUTPATIENT_CLINIC_OR_DEPARTMENT_OTHER): Payer: Medicare Other

## 2015-04-12 VITALS — BP 137/62 | HR 59 | Temp 97.8°F | Resp 18

## 2015-04-12 DIAGNOSIS — C179 Malignant neoplasm of small intestine, unspecified: Secondary | ICD-10-CM

## 2015-04-12 MED ORDER — SODIUM CHLORIDE 0.9 % IJ SOLN
10.0000 mL | INTRAMUSCULAR | Status: DC | PRN
  Administered 2015-04-12: 10 mL
  Filled 2015-04-12: qty 10

## 2015-04-12 MED ORDER — HEPARIN SOD (PORK) LOCK FLUSH 100 UNIT/ML IV SOLN
500.0000 [IU] | Freq: Once | INTRAVENOUS | Status: AC | PRN
Start: 2015-04-12 — End: 2015-04-12
  Administered 2015-04-12: 500 [IU]
  Filled 2015-04-12: qty 5

## 2015-04-12 NOTE — Patient Instructions (Signed)

## 2015-04-23 ENCOUNTER — Encounter: Payer: Self-pay | Admitting: Physician Assistant

## 2015-04-23 ENCOUNTER — Other Ambulatory Visit: Payer: Self-pay | Admitting: Hematology

## 2015-04-23 ENCOUNTER — Ambulatory Visit (HOSPITAL_BASED_OUTPATIENT_CLINIC_OR_DEPARTMENT_OTHER): Payer: Medicare Other

## 2015-04-23 ENCOUNTER — Other Ambulatory Visit (HOSPITAL_BASED_OUTPATIENT_CLINIC_OR_DEPARTMENT_OTHER): Payer: Medicare Other

## 2015-04-23 ENCOUNTER — Ambulatory Visit (HOSPITAL_BASED_OUTPATIENT_CLINIC_OR_DEPARTMENT_OTHER): Payer: Medicare Other | Admitting: Physician Assistant

## 2015-04-23 VITALS — BP 116/50 | HR 60 | Temp 97.8°F | Resp 20 | Ht 66.0 in | Wt 200.9 lb

## 2015-04-23 DIAGNOSIS — C171 Malignant neoplasm of jejunum: Secondary | ICD-10-CM | POA: Diagnosis not present

## 2015-04-23 DIAGNOSIS — T451X5A Adverse effect of antineoplastic and immunosuppressive drugs, initial encounter: Principal | ICD-10-CM

## 2015-04-23 DIAGNOSIS — C179 Malignant neoplasm of small intestine, unspecified: Secondary | ICD-10-CM

## 2015-04-23 DIAGNOSIS — E1165 Type 2 diabetes mellitus with hyperglycemia: Secondary | ICD-10-CM | POA: Insufficient documentation

## 2015-04-23 DIAGNOSIS — IMO0002 Reserved for concepts with insufficient information to code with codable children: Secondary | ICD-10-CM | POA: Insufficient documentation

## 2015-04-23 DIAGNOSIS — G620 Drug-induced polyneuropathy: Secondary | ICD-10-CM | POA: Diagnosis not present

## 2015-04-23 DIAGNOSIS — Z5111 Encounter for antineoplastic chemotherapy: Secondary | ICD-10-CM

## 2015-04-23 DIAGNOSIS — C787 Secondary malignant neoplasm of liver and intrahepatic bile duct: Secondary | ICD-10-CM

## 2015-04-23 DIAGNOSIS — E1121 Type 2 diabetes mellitus with diabetic nephropathy: Secondary | ICD-10-CM | POA: Insufficient documentation

## 2015-04-23 DIAGNOSIS — E7800 Pure hypercholesterolemia, unspecified: Secondary | ICD-10-CM | POA: Insufficient documentation

## 2015-04-23 LAB — COMPREHENSIVE METABOLIC PANEL (CC13)
ALBUMIN: 2.9 g/dL — AB (ref 3.5–5.0)
ALK PHOS: 87 U/L (ref 40–150)
ALT: 20 U/L (ref 0–55)
ANION GAP: 8 meq/L (ref 3–11)
AST: 33 U/L (ref 5–34)
BUN: 11.7 mg/dL (ref 7.0–26.0)
CALCIUM: 8.9 mg/dL (ref 8.4–10.4)
CHLORIDE: 109 meq/L (ref 98–109)
CO2: 26 mEq/L (ref 22–29)
Creatinine: 1 mg/dL (ref 0.7–1.3)
EGFR: 74 mL/min/{1.73_m2} — AB (ref >90)
Glucose: 78 mg/dl (ref 70–140)
POTASSIUM: 4.1 meq/L (ref 3.5–5.1)
Sodium: 142 mEq/L (ref 136–145)
Total Bilirubin: 0.4 mg/dL (ref 0.20–1.20)
Total Protein: 6.3 g/dL — ABNORMAL LOW (ref 6.4–8.3)

## 2015-04-23 LAB — CBC & DIFF AND RETIC
BASO%: 0.8 % (ref 0.0–2.0)
BASOS ABS: 0 10*3/uL (ref 0.0–0.1)
EOS ABS: 0.1 10*3/uL (ref 0.0–0.5)
EOS%: 4.8 % (ref 0.0–7.0)
HEMATOCRIT: 30.1 % — AB (ref 38.4–49.9)
HEMOGLOBIN: 9.3 g/dL — AB (ref 13.0–17.1)
Immature Retic Fract: 8.9 % (ref 3.00–10.60)
LYMPH%: 37.6 % (ref 14.0–49.0)
MCH: 29.1 pg (ref 27.2–33.4)
MCHC: 30.9 g/dL — AB (ref 32.0–36.0)
MCV: 94.1 fL (ref 79.3–98.0)
MONO#: 0.2 10*3/uL (ref 0.1–0.9)
MONO%: 9.2 % (ref 0.0–14.0)
NEUT%: 47.6 % (ref 39.0–75.0)
NEUTROS ABS: 1.2 10*3/uL — AB (ref 1.5–6.5)
Platelets: 97 10*3/uL — ABNORMAL LOW (ref 140–400)
RBC: 3.2 10*6/uL — ABNORMAL LOW (ref 4.20–5.82)
RDW: 15.1 % — AB (ref 11.0–14.6)
RETIC %: 0.82 % (ref 0.80–1.80)
Retic Ct Abs: 26.24 10*3/uL — ABNORMAL LOW (ref 34.80–93.90)
WBC: 2.5 10*3/uL — AB (ref 4.0–10.3)
lymph#: 0.9 10*3/uL (ref 0.9–3.3)

## 2015-04-23 MED ORDER — GABAPENTIN 100 MG PO CAPS: 100.0000 mg | ORAL_CAPSULE | Freq: Three times a day (TID) | ORAL | Status: DC

## 2015-04-23 MED ORDER — SODIUM CHLORIDE 0.9 % IV SOLN
400.0000 mg/m2 | Freq: Once | INTRAVENOUS | Status: AC
  Administered 2015-04-23: 848 mg via INTRAVENOUS
  Filled 2015-04-23: qty 42.4

## 2015-04-23 MED ORDER — SODIUM CHLORIDE 0.9 % IV SOLN
Freq: Once | INTRAVENOUS | Status: AC
  Administered 2015-04-23: 10:00:00 via INTRAVENOUS
  Filled 2015-04-23: qty 8

## 2015-04-23 MED ORDER — ATROPINE SULFATE 1 MG/ML IJ SOLN
0.5000 mg | Freq: Once | INTRAMUSCULAR | Status: AC | PRN
  Administered 2015-04-23: 0.5 mg via INTRAVENOUS

## 2015-04-23 MED ORDER — IRINOTECAN HCL CHEMO INJECTION 100 MG/5ML
165.0000 mg/m2 | Freq: Once | INTRAVENOUS | Status: AC
  Administered 2015-04-23: 350 mg via INTRAVENOUS
  Filled 2015-04-23: qty 17.5

## 2015-04-23 MED ORDER — SODIUM CHLORIDE 0.9 % IV SOLN
2000.0000 mg/m2 | INTRAVENOUS | Status: DC
  Administered 2015-04-23: 4250 mg via INTRAVENOUS
  Filled 2015-04-23: qty 85

## 2015-04-23 MED ORDER — ATROPINE SULFATE 1 MG/ML IJ SOLN
INTRAMUSCULAR | Status: AC
  Filled 2015-04-23: qty 1

## 2015-04-23 MED ORDER — SODIUM CHLORIDE 0.9 % IV SOLN
Freq: Once | INTRAVENOUS | Status: AC
  Administered 2015-04-23: 10:00:00 via INTRAVENOUS

## 2015-04-23 NOTE — Progress Notes (Signed)
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    08/18/2014 Miscellaneous tumor KRAS mutation (-)   08/18/2014 Pathology Results Liver biopsy showed metastatic adenocarcinoma, consistent with GI primary   08/18/2014 Initial Diagnosis Small bowel cancer   08/18/2014 Procedure small bowel enteroscopy with biopsy by Dr. Benson Norway showed at the proximal jejunum there was evidence of abnormal mucosa and friability, biopsy was obtained.    09/05/2014 Imaging PET hypermetabolic mass involving a small bowel loop with adjacent mesenteric lymphadenopathy, and diffuse liver metastasis and mild Pallor metabolic lymphadenopathy in porta hepatis.   09/06/2014 - 02/01/2015 Chemotherapy mFOLFOX, stopped due to his neuropathy, and earlier mild disease progression on PET    09/07/2014 - 09/11/2014 Hospital Admission He was admitted for fever and GI bleeding. Received 3 units of RBC.   10/26/2014 Imaging Interval significant improvement in the primary small bowel mass, adjacent lymphadenopathy and extensive hepatic metastatic disease. No other new lesions.    02/09/2015 Imaging PET/CT scan showed stable primary malignancy in the proximal jejunum, mild metabolic progression of the 3 residual metabolic liver metastasis. CT  Portion showed decreased size of his liver metastasis.   02/27/2015 -  Chemotherapy second line chemo FOLFIRI, every 2 weeks, and panitumumab (held for second cycle due to severe skin rashes)       HISTORY OF PRESENTING ILLNESS:  Tyler Evans 69 y.o. male is here because of a recent diagnosis of small bowel cancer. He presented to his PCP on 08/17/2014 with complaints of feeling weak and a one month history of a cough. He was found to be anemic and was referred to the emergency department. Hemoglobin was found to be 7.6, MCV 76.6; creatinine was elevated at 1.74. Stool was Hemoccult positive.  CT abdomen/pelvis without contrast on 08/17/2014 showed multiple large masses within the liver, bulky in appearance. The largest measured approximately 5.7 cm. There appeared to be a focal filling defect within the proximal jejunum measuring approximately 2.5 x 1.7 cm. There was mild adjacent jejunal wall thickening. The lung bases were clear.  On 08/18/2014 he underwent a small bowel enteroscopy with biopsy by Dr. Benson Norway. The esophagus and gastric lumen were normal. At the proximal jejunum there was evidence of abnormal mucosa and friability. The pediatric colonoscope was not able to traverse the area. Biopsies were obtained. An ultraslim colonoscope was then utilized. This colonoscope was able to traverse the area of stenosis which measured approximately 3 cm in length. 50% of the lumen was ulcerated. 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 MMR stains are pending.  He was discharged home on 08/19/2014.  CURRENT THERAPY: FOLFIRI with 5FU $Remove'2400mg'QWKrFqy$ /m2 over 46 hours and irinotecan $RemoveBefore'180mg'KyCiVEsePcZqq$ /m2, and Panitumumab $RemoveBeforeD'6mg'tXZvKERDmyDchW$ /kg, every 2 weeks, started on 02/27/15, panitumumab held since cycle 2, due to sever skin rashes.    INTERIM HISTORY:  Tyler Evans returns for follow-up. He is feeling well.  He was able to do more scarred and work and tolerate more  activities. His rash has improved too. Slightly aches on the back no other discomfort from his rash. His neuropathy is stable, no new complaints. He wants a refill of Neurontin reporting it helps. He denies any fever or chills  or night sweats. He denies any respiratory of cardiac complaints. He denies any nausea or vomiting. No diarrhea or constipation. Denies any bleeding issues.   MEDICAL HISTORY:  Past Medical History  Diagnosis Date  . Diabetes mellitus without complication ([place])   . Hypertension   . Small bowel cancer ([place]) 08/18/2014    SURGICAL HISTORY: Past Surgical History  Procedure Laterality Date  . Enteroscopy N/A 08/18/2014    Procedure: ENTEROSCOPY;  Surgeon: Carol Ada, MD;  Location: [facility];  Service: Endoscopy;  Laterality: N/A;    SOCIAL HISTORY: History   Social History  . Marital Status: Married    Spouse Name: N/A  . Number of Children: 2  . Years of Education: N/A   Occupational History  .  retired Ambulance person    Social History Main Topics  . Smoking status: Never Smoker   . Smokeless tobacco: Not on file  . Alcohol Use: No  . Drug Use: Not on file  . Sexual Activity: Not on file   Other Topics Concern  . Not on file   Social History Narrative  He lives in [place]. He is married. He has 2 children both reported to be in good health. No tobacco or alcohol use. He is a retired Ambulance person.  FAMILY HISTORY: Family History  Problem Relation Age of Onset  . Heart failure, Alzheimer's  Mother   . Lung cancer Father   . Kidney cancer Brother     ALLERGIES:  is allergic to penicillins.  MEDICATIONS:  Current Outpatient Prescriptions  Medication Sig Dispense Refill  . atorvastatin (LIPITOR) 20 MG tablet Take 20 mg by mouth daily.    Marland Kitchen glipiZIDE (GLUCOTROL) 10 MG tablet Take 10 mg by mouth 2 (two) times daily before a meal.    . insulin glargine (LANTUS) 100 UNIT/ML injection Inject 40 Units into the skin at bedtime.    . pantoprazole (PROTONIX) 40 MG tablet Take 1 tablet (40 mg total) by mouth daily. 30 tablet 0  . chlorthalidone (HYGROTON) 25 MG tablet Take 25 mg by mouth daily.    Marland Kitchen lisinopril (PRINIVIL,ZESTRIL) 40 MG tablet Take 40 mg by mouth at  bedtime.    . metFORMIN (GLUCOPHAGE) 1000 MG tablet Take 1,000 mg by mouth 2 (two) times daily with a meal.         REVIEW OF SYSTEMS:   Constitutional: Denies fevers, chills or abnormal night sweats; mild fatigue, weight is stable.  Eyes: Denies blurriness of vision, double vision or watery eyes Ears, nose, mouth, throat, and face: Denies mucositis or sore throat Respiratory: One month history of a nonproductive cough;  no shortness of breath Cardiovascular: Denies palpitation, chest discomfort or lower extremity swelling Gastrointestinal:  Denies nausea, heartburn. No dysphagia. He has noted less frequent bowel movements over the past 2 weeks. Does not characterize this as constipation. Skin: His rash is much improved. Lymphatics: Denies new lymphadenopathy or easy bruising Neurological: He has peripheral neuropathy as mentioned above. No extremity weakness Behavioral/Psych: Mood is stable, no new changes  All other systems were reviewed with the patient and are negative.  PHYSICAL EXAMINATION: ECOG PERFORMANCE STATUS: 1  Filed Vitals:   04/23/15 0847 04/23/15 0849  BP: 116/50   Pulse: 56 60  Temp: 97.8 F (  36.6 C)   Resp: 20    Filed Weights   04/23/15 0847  Weight: 200 lb 14.4 oz (91.128 kg)    GENERAL:alert, no distress and comfortable SKIN: skin color, texture, turgor are normal, (+) diffuse acne-like rash on his face, neck, chest and upper abdomen. The rash on the face are healing well, left overall with pigmentations. No diffuse skin erythema, or signs of infection. This is improving. EYES: normal, conjunctiva are pink and non-injected, sclera clear OROPHARYNX:no exudate, no erythema and lips, buccal mucosa, and tongue normal  NECK: supple, thyroid normal size, non-tender, without nodularity LYMPH:  no palpable lymphadenopathy in the cervical, axillary or inguinal regions LUNGS: clear to auscultation and percussion with normal breathing effort HEART: regular rate &  rhythm and no murmurs and no lower extremity edema ABDOMEN:abdomen soft, non tender no rebound pain, normal bowel sounds Musculoskeletal:no cyanosis of digits and no clubbing  PSYCH: alert & oriented x 3 with fluent speech NEURO: no focal motor deficits, his light touch sensation and vibration sensation are diminished on his hands and feet.   LABORATORY DATA:  I have reviewed the data as listed CBC Latest Ref Rng 04/23/2015 04/09/2015 03/27/2015  WBC 4.0 - 10.3 10e3/uL 2.5(L) 4.6 2.2(L)  Hemoglobin 13.0 - 17.1 g/dL 9.3(L) 9.6(L) 9.9(L)  Hematocrit 38.4 - 49.9 % 30.1(L) 30.8(L) 30.9(L)  Platelets 140 - 400 10e3/uL 97(L) 145 142  ANC 1.2  CMP Latest Ref Rng 04/09/2015 03/27/2015 03/13/2015  Glucose 70 - 140 mg/dl 75 171(H) 163(H)  BUN 7.0 - 26.0 mg/dL 11.4 13.7 10.2  Creatinine 0.7 - 1.3 mg/dL 1.0 1.2 1.2  Sodium 136 - 145 mEq/L 144 140 144  Potassium 3.5 - 5.1 mEq/L 3.7 4.1 4.0  Chloride 96 - 112 mmol/L - - -  CO2 22 - 29 mEq/L $Remove'26 25 26  'Mpksmin$ Calcium 8.4 - 10.4 mg/dL 8.9 9.0 9.0  Total Protein 6.4 - 8.3 g/dL 6.2(L) 6.5 6.8  Total Bilirubin 0.20 - 1.20 mg/dL 0.44 0.45 0.34  Alkaline Phos 40 - 150 U/L 77 84 123  AST 5 - 34 U/L $Remo'29 27 27  'FEiVK$ ALT 0 - 55 U/L $Remo'18 18 22   'GBlSg$ CEA  Status: Finalresult Visible to patient:  MyChart Nextappt: Today at 09:15 AM in Oncology [facility] Procedure 2) Dx:  Small bowel cancer ([place])           Ref Range 2wk ago  60mo ago  23mo ago  72mo ago     CEA 0.0 - 5.0 ng/mL 5.0 6.0 (H) 5.5 (H) 6.3 (H)         Pathology report:  Small Intestine Biopsy, jejunal mass 08/18/2014 - INVASIVE ADENOCARCINOMA IN A BACKGROUND OF TUBULOVILLOUS ADENOMA. ADDITIONAL INFORMATION: Mismatch Repair (MMR) Protein Immunohistochemistry (IHC) IHC Expression Result (LIMITED TUMOR): MLH1: Preserved nuclear expression (greater 50% tumor expression) MSH2: Preserved nuclear expression (greater 50% tumor expression) MSH6: Preserved nuclear expression (greater 50%  tumor expression) PMS2: Preserved nuclear expression (greater 50% tumor expression) * Internal control demonstrates intact nuclear expression Interpretation: NORMAL There is preserved expression  Diagnosis 08/28/2014  Liver, needle/core biopsy, right lobe - METASTATIC ADENOCARCINOMA   RADIOGRAPHIC STUDIES: I have personally reviewed the radiological images as listed and agreed with the findings in the report.  Nm Pet Image Initial (pi) Skull Base To Thigh 09/05/2014    IMPRESSION: Hypermetabolic mass involving a small bowel loop with adjacent mesenteric lymphadenopathy in the left abdomen, consistent with known primary small bowel carcinoma. No evidence of small bowel obstruction.  Diffuse liver  metastases. Mild hypermetabolic lymphadenopathy in porta hepatis, also suspicious for metastatic disease.  No evidence of metastatic disease within the neck, chest, or pelvis.  Incidental findings including cholelithiasis, diverticulosis, and mildly enlarged prostate.   Electronically Signed   By: Earle Gell M.D.   On: 09/05/2014 17:29   PET 02/09/2015 IMPRESSION: 1. No appreciable metabolic change at the site of the persistently hypermetabolic primary malignancy in the proximal jejunum. 2. Mild metabolic progression of the three residual hypermetabolic liver metastases. 3. No new sites of hypermetabolic metastatic disease. 4. Worsening near complete left sphenoid sinus opacification. Enlarging mucous retention cyst in the right maxillary sinus. 5. Cholelithiasis.    ASSESSMENT & PLAN:  69 year old gentleman with past medical history of diabetes, hypertension, who presents with anemia and weakness.  1.Small bowel cancer metastatic to the liver and abdominal nodes, MMR normal, KRAS/NRAS wild type His most recent  PET/CT findings show slightly increased hypermetabolic activity in his liver lesions, however the CT portion of the scan showed significant decrease in the size of the liver metastasis.    His CEA level are also stable and remains to be low, overall he is still responding to treatment, but possible early resistant clones are developing. -given his overall good performance status, his chemo was changed to FOLFIRI and panitumumab. He developed severe skin rash. Panitumumab was subsequently held. -He tolerated his last cycle 3 of chemotherapy after reducing his 5-FU dose to 2000 mg/m  2 weeks ago Will continue present therapy as planned Will monitor his WBC and platelets closely He will return every 2 weeks as scheduled pending on his counts. Certainly if he has tolerance issue or cytopenia, we'll adjust his chemotherapy.  2. Microcytic anemia secondary to GI bleeding and iron deficiency -His serum iron level and saturation were low, although ferritin is normal, he has some degree of iron deficient anemia, and anemia of malignancy  -Consider blood transfusion if hemoglobin less than 8 or symptomatic anemia -he received IV feraheme 510 mg twice in 09/2014, repeated iron study was normal in 02/2015  -His hemoglobin is  9.3, stable  3.  Hypertension and DM  -He will continue follow-up with his primary care physician. -We discussed that we will monitor his blood pressure and blood sugar closely and may need to adjust his medication during the chemotherapy   5. CKD, stage III -stable, Cr 1.0 today Encouraged him to continue to drink adequately and avoid dehydration.  6. Fatigue, anorexia -improved after a 2 week chemo break prior to last cycle  7. Skin rash -secondary to panitumumab  -improving, we discussed to restart panitumumab at half dose in early December after the Thanksgiving Holiday    Plan: -cycle 4 FOLFIRI today, with 5-fu dose reduction to $RemoveBefo'2000mg'MwgWRXeOMKh$ /m2 due to neutropenia, no Neulasta with this cycle, continue every 2 weeks   Will see him back in 4 weeks   [facility]  04/23/2015

## 2015-04-23 NOTE — Progress Notes (Signed)
OK to treat per Office note from today's visit.

## 2015-04-23 NOTE — Patient Instructions (Signed)
[facility] Discharge Instructions for Patients Receiving Chemotherapy  Today you received the following chemotherapy agents irinotecan/leucovorin/fluorouracil   To help prevent nausea and vomiting after your treatment, we encourage you to take your nausea medication as directed   If you develop nausea and vomiting that is not controlled by your nausea medication, call the clinic.   BELOW ARE SYMPTOMS THAT SHOULD BE REPORTED IMMEDIATELY:  *FEVER GREATER THAN 100.5 F  *CHILLS WITH OR WITHOUT FEVER  NAUSEA AND VOMITING THAT IS NOT CONTROLLED WITH YOUR NAUSEA MEDICATION  *UNUSUAL SHORTNESS OF BREATH  *UNUSUAL BRUISING OR BLEEDING  TENDERNESS IN MOUTH AND THROAT WITH OR WITHOUT PRESENCE OF ULCERS  *URINARY PROBLEMS  *BOWEL PROBLEMS  UNUSUAL RASH Items with * indicate a potential emergency and should be followed up as soon as possible.  Feel free to call the clinic you have any questions or concerns. The clinic phone number is [phone].  

## 2015-04-24 LAB — CEA: CEA: 9.6 ng/mL — AB (ref 0.0–5.0)

## 2015-04-25 ENCOUNTER — Ambulatory Visit (HOSPITAL_BASED_OUTPATIENT_CLINIC_OR_DEPARTMENT_OTHER): Payer: Medicare Other

## 2015-04-25 DIAGNOSIS — C179 Malignant neoplasm of small intestine, unspecified: Secondary | ICD-10-CM | POA: Diagnosis not present

## 2015-04-25 MED ORDER — SODIUM CHLORIDE 0.9 % IJ SOLN
10.0000 mL | INTRAMUSCULAR | Status: DC | PRN
Start: 2015-04-25 — End: 2015-04-25
  Administered 2015-04-25: 10 mL
  Filled 2015-04-25: qty 10

## 2015-04-25 MED ORDER — HEPARIN SOD (PORK) LOCK FLUSH 100 UNIT/ML IV SOLN
500.0000 [IU] | Freq: Once | INTRAVENOUS | Status: AC | PRN
Start: 2015-04-25 — End: 2015-04-25
  Administered 2015-04-25: 500 [IU]
  Filled 2015-04-25: qty 5

## 2015-04-30 ENCOUNTER — Other Ambulatory Visit: Payer: Self-pay | Admitting: Hematology

## 2015-04-30 NOTE — Telephone Encounter (Signed)
Refilled done by Sharene Butters, PA on  04/23/15.

## 2015-05-07 ENCOUNTER — Telehealth: Payer: Self-pay | Admitting: *Deleted

## 2015-05-07 ENCOUNTER — Encounter: Payer: Self-pay | Admitting: Hematology

## 2015-05-07 ENCOUNTER — Other Ambulatory Visit (HOSPITAL_BASED_OUTPATIENT_CLINIC_OR_DEPARTMENT_OTHER): Payer: Medicare Other

## 2015-05-07 ENCOUNTER — Telehealth: Payer: Self-pay | Admitting: Hematology

## 2015-05-07 ENCOUNTER — Ambulatory Visit (HOSPITAL_BASED_OUTPATIENT_CLINIC_OR_DEPARTMENT_OTHER): Payer: Medicare Other | Admitting: Hematology

## 2015-05-07 VITALS — BP 119/49 | HR 60 | Temp 98.3°F | Resp 17 | Ht 66.0 in | Wt 204.3 lb

## 2015-05-07 DIAGNOSIS — D509 Iron deficiency anemia, unspecified: Secondary | ICD-10-CM

## 2015-05-07 DIAGNOSIS — I10 Essential (primary) hypertension: Secondary | ICD-10-CM

## 2015-05-07 DIAGNOSIS — C787 Secondary malignant neoplasm of liver and intrahepatic bile duct: Secondary | ICD-10-CM

## 2015-05-07 DIAGNOSIS — C772 Secondary and unspecified malignant neoplasm of intra-abdominal lymph nodes: Secondary | ICD-10-CM

## 2015-05-07 DIAGNOSIS — D709 Neutropenia, unspecified: Secondary | ICD-10-CM | POA: Diagnosis not present

## 2015-05-07 DIAGNOSIS — N189 Chronic kidney disease, unspecified: Secondary | ICD-10-CM

## 2015-05-07 DIAGNOSIS — C179 Malignant neoplasm of small intestine, unspecified: Secondary | ICD-10-CM

## 2015-05-07 DIAGNOSIS — T451X5A Adverse effect of antineoplastic and immunosuppressive drugs, initial encounter: Secondary | ICD-10-CM

## 2015-05-07 DIAGNOSIS — D500 Iron deficiency anemia secondary to blood loss (chronic): Secondary | ICD-10-CM

## 2015-05-07 DIAGNOSIS — G620 Drug-induced polyneuropathy: Secondary | ICD-10-CM

## 2015-05-07 DIAGNOSIS — E119 Type 2 diabetes mellitus without complications: Secondary | ICD-10-CM

## 2015-05-07 DIAGNOSIS — R630 Anorexia: Secondary | ICD-10-CM

## 2015-05-07 DIAGNOSIS — R5383 Other fatigue: Secondary | ICD-10-CM

## 2015-05-07 LAB — CBC & DIFF AND RETIC
BASO%: 0 % (ref 0.0–2.0)
Basophils Absolute: 0 10*3/uL (ref 0.0–0.1)
EOS%: 1.5 % (ref 0.0–7.0)
Eosinophils Absolute: 0 10*3/uL (ref 0.0–0.5)
HCT: 29.6 % — ABNORMAL LOW (ref 38.4–49.9)
HGB: 9.2 g/dL — ABNORMAL LOW (ref 13.0–17.1)
Immature Retic Fract: 10.3 % (ref 3.00–10.60)
LYMPH%: 46.4 % (ref 14.0–49.0)
MCH: 29.1 pg (ref 27.2–33.4)
MCHC: 31.1 g/dL — AB (ref 32.0–36.0)
MCV: 93.7 fL (ref 79.3–98.0)
MONO#: 0.4 10*3/uL (ref 0.1–0.9)
MONO%: 22.7 % — AB (ref 0.0–14.0)
NEUT%: 29.4 % — AB (ref 39.0–75.0)
NEUTROS ABS: 0.6 10*3/uL — AB (ref 1.5–6.5)
PLATELETS: 98 10*3/uL — AB (ref 140–400)
RBC: 3.16 10*6/uL — AB (ref 4.20–5.82)
RDW: 15.1 % — ABNORMAL HIGH (ref 11.0–14.6)
Retic %: 0.94 % (ref 0.80–1.80)
Retic Ct Abs: 29.7 10*3/uL — ABNORMAL LOW (ref 34.80–93.90)
WBC: 1.9 10*3/uL — AB (ref 4.0–10.3)
lymph#: 0.9 10*3/uL (ref 0.9–3.3)

## 2015-05-07 LAB — COMPREHENSIVE METABOLIC PANEL
ALT: 20 U/L (ref 0–55)
ANION GAP: 7 meq/L (ref 3–11)
AST: 30 U/L (ref 5–34)
Albumin: 2.9 g/dL — ABNORMAL LOW (ref 3.5–5.0)
Alkaline Phosphatase: 82 U/L (ref 40–150)
BILIRUBIN TOTAL: 0.33 mg/dL (ref 0.20–1.20)
BUN: 14.2 mg/dL (ref 7.0–26.0)
CHLORIDE: 111 meq/L — AB (ref 98–109)
CO2: 23 meq/L (ref 22–29)
Calcium: 8.9 mg/dL (ref 8.4–10.4)
Creatinine: 1.1 mg/dL (ref 0.7–1.3)
EGFR: 68 mL/min/{1.73_m2} — AB (ref >90)
Glucose: 137 mg/dl (ref 70–140)
POTASSIUM: 4.1 meq/L (ref 3.5–5.1)
SODIUM: 142 meq/L (ref 136–145)
TOTAL PROTEIN: 6.4 g/dL (ref 6.4–8.3)

## 2015-05-07 MED ORDER — GABAPENTIN 100 MG PO CAPS: 200.0000 mg | ORAL_CAPSULE | Freq: Three times a day (TID) | ORAL | Status: DC

## 2015-05-07 NOTE — Telephone Encounter (Signed)
Per staff message and POF I have scheduled appts. Advised scheduler of appts. JMW  

## 2015-05-07 NOTE — Telephone Encounter (Signed)
Gave and printed appt sched and avs for pt for DEC...gv barium °

## 2015-05-07 NOTE — Progress Notes (Signed)
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    08/18/2014 Miscellaneous tumor KRAS mutation (-)   08/18/2014 Pathology Results Liver biopsy showed metastatic adenocarcinoma, consistent with GI primary   08/18/2014 Initial Diagnosis Small bowel cancer   08/18/2014 Procedure small bowel enteroscopy with biopsy by Dr. Benson Norway showed at the proximal jejunum there was evidence of abnormal mucosa and friability, biopsy was obtained.    09/05/2014 Imaging PET hypermetabolic mass involving a small bowel loop with adjacent mesenteric lymphadenopathy, and diffuse liver metastasis and mild Pallor metabolic lymphadenopathy in porta hepatis.   09/06/2014 - 02/01/2015 Chemotherapy mFOLFOX, stopped due to his neuropathy, and earlier mild disease progression on PET    09/07/2014 - 09/11/2014 Hospital Admission He was admitted for fever and GI bleeding. Received 3 units of RBC.   10/26/2014 Imaging Interval significant improvement in the primary small bowel mass, adjacent lymphadenopathy and extensive hepatic metastatic disease. No other new lesions.    02/09/2015 Imaging PET/CT scan showed stable primary malignancy in the proximal jejunum, mild metabolic progression of the 3 residual metabolic liver metastasis. CT  Portion showed decreased size of his liver metastasis.   02/27/2015 -  Chemotherapy second line chemo FOLFIRI, every 2 weeks, and panitumumab (held for second cycle due to severe skin rashes)       HISTORY OF PRESENTING ILLNESS:  Tyler Evans 69 y.o. male is here because of a recent diagnosis of small bowel cancer. He presented to his PCP on 08/17/2014 with complaints of feeling weak and a one month history of a cough. He was found to be anemic and was referred to the emergency department. Hemoglobin was found to be 7.6, MCV 76.6; creatinine was elevated at 1.74. Stool was Hemoccult positive.  CT abdomen/pelvis without contrast on 08/17/2014 showed multiple large masses within the liver, bulky in appearance. The largest measured approximately 5.7 cm. There appeared to be a focal filling defect within the proximal jejunum measuring approximately 2.5 x 1.7 cm. There was mild adjacent jejunal wall thickening. The lung bases were clear.  On 08/18/2014 he underwent a small bowel enteroscopy with biopsy by Dr. Benson Norway. The esophagus and gastric lumen were normal. At the proximal jejunum there was evidence of abnormal mucosa and friability. The pediatric colonoscope was not able to traverse the area. Biopsies were obtained. An ultraslim colonoscope was then utilized. This colonoscope was able to traverse the area of stenosis which measured approximately 3 cm in length. 50% of the lumen was ulcerated. 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 MMR stains are pending.  He was discharged home on 08/19/2014.  CURRENT THERAPY: FOLFIRI with 5FU 243m/m2 (decresed to 20085mm2 from cycle 4) over 46 hours and irinotecan 18048m2 (decreased to 165m79m from cycle 4), every 2 weeks.   INTERIM HISTORY:  Tyler Evans for follow-up. He has been tolerating the chemotherapy well, no worsening of his moderate fatigue, no significant nausea or diarrhea, he has been eating  and drinking pretty well. No fever or chills. He still has mild to moderate numbness and tingling on his fingers and toes, he takes Neurontin 2 tablets at night, and one tablet twice daily day. No  other new complaints.   MEDICAL HISTORY:  Past Medical History  Diagnosis Date  . Diabetes mellitus without complication ([place])   . Hypertension   . Small bowel cancer ([place]) 08/18/2014    SURGICAL HISTORY: Past Surgical History  Procedure Laterality Date  . Enteroscopy N/A 08/18/2014    Procedure: ENTEROSCOPY;  Surgeon: Carol Ada, MD;  Location: [facility];  Service: Endoscopy;  Laterality: N/A;    SOCIAL HISTORY: History   Social History  . Marital Status: Married    Spouse Name: N/A  . Number of Children: 2  . Years of Education: N/A   Occupational History  .  retired Ambulance person    Social History Main Topics  . Smoking status: Never Smoker   . Smokeless tobacco: Not on file  . Alcohol Use: No  . Drug Use: Not on file  . Sexual Activity: Not on file   Other Topics Concern  . Not on file   Social History Narrative  He lives in [place]. He is married. He has 2 children both reported to be in good health. No tobacco or alcohol use. He is a retired Ambulance person.  FAMILY HISTORY: Family History  Problem Relation Age of Onset  . Heart failure, Alzheimer's  Mother   . Lung cancer Father   . Kidney cancer Brother     ALLERGIES:  is allergic to penicillins.  MEDICATIONS:  Current Outpatient Prescriptions  Medication Sig Dispense Refill  . atorvastatin (LIPITOR) 20 MG tablet Take 20 mg by mouth daily.    Marland Kitchen glipiZIDE (GLUCOTROL) 10 MG tablet Take 10 mg by mouth 2 (two) times daily before a meal.    . insulin glargine (LANTUS) 100 UNIT/ML injection Inject 40 Units into the skin at bedtime.    . pantoprazole (PROTONIX) 40 MG tablet Take 1 tablet (40 mg total) by mouth daily. 30 tablet 0  . chlorthalidone (HYGROTON) 25 MG tablet Take 25 mg by mouth daily.    Marland Kitchen lisinopril (PRINIVIL,ZESTRIL) 40 MG tablet Take 40 mg by mouth at bedtime.    . metFORMIN (GLUCOPHAGE) 1000 MG tablet Take 1,000 mg by mouth 2 (two) times daily with a meal.         REVIEW OF  SYSTEMS:   Constitutional: Denies fevers, chills or abnormal night sweats; mild fatigue, weight is stable.  Eyes: Denies blurriness of vision, double vision or watery eyes Ears, nose, mouth, throat, and face: Denies mucositis or sore throat Respiratory: One month history of a nonproductive cough;  no shortness of breath Cardiovascular: Denies palpitation, chest discomfort or lower extremity swelling Gastrointestinal:  Denies nausea, heartburn. No dysphagia. He has noted less frequent bowel movements over the past 2 weeks. Does not characterize this as constipation. Skin: Denies abnormal skin rashes Lymphatics: Denies new lymphadenopathy or easy bruising Neurological: Denies numbness, tingling. No extremity weakness Behavioral/Psych: Mood is stable, no new changes  All other systems were reviewed with the patient and are negative.  PHYSICAL EXAMINATION: ECOG PERFORMANCE STATUS: 1  Filed Vitals:   05/07/15 0837  BP: 119/49  Pulse: 60  Temp: 98.3 F (36.8 C)  Resp: 17   Filed Weights   05/07/15 0837  Weight: 204 lb 4.8 oz (92.67 kg)    GENERAL:alert, no distress and comfortable SKIN: skin color, texture,  turgor are normal, (+) skin pigmentation from his rash on his face, neck, chest and upper abdomen.  EYES: normal, conjunctiva are pink and non-injected, sclera clear OROPHARYNX:no exudate, no erythema and lips, buccal mucosa, and tongue normal  NECK: supple, thyroid normal size, non-tender, without nodularity LYMPH:  no palpable lymphadenopathy in the cervical, axillary or inguinal regions LUNGS: clear to auscultation and percussion with normal breathing effort HEART: regular rate & rhythm and no murmurs and no lower extremity edema ABDOMEN:abdomen soft, mild tenderness at the right upper quadrant , no rebound pain, normal bowel sounds Musculoskeletal:no cyanosis of digits and no clubbing  PSYCH: alert & oriented x 3 with fluent speech NEURO: no focal motor deficits, his light  touch sensation and vibration sensation are diminished on his hands and feet.   LABORATORY DATA:  I have reviewed the data as listed CBC Latest Ref Rng 05/07/2015 04/23/2015 04/09/2015  WBC 4.0 - 10.3 10e3/uL 1.9(L) 2.5(L) 4.6  Hemoglobin 13.0 - 17.1 g/dL 9.2(L) 9.3(L) 9.6(L)  Hematocrit 38.4 - 49.9 % 29.6(L) 30.1(L) 30.8(L)  Platelets 140 - 400 10e3/uL 98(L) 97(L) 145    CMP Latest Ref Rng 05/07/2015 04/23/2015 04/09/2015  Glucose 70 - 140 mg/dl 137 78 75  BUN 7.0 - 26.0 mg/dL 14.2 11.7 11.4  Creatinine 0.7 - 1.3 mg/dL 1.1 1.0 1.0  Sodium 136 - 145 mEq/L 142 142 144  Potassium 3.5 - 5.1 mEq/L 4.1 4.1 3.7  Chloride 96 - 112 mmol/L - - -  CO2 22 - 29 mEq/L _0 Calcium 8.4 - 10.4 mg/dL 8.9 8.9 8.9  Total Protein 6.4 - 8.3 g/dL 6.4 6.3(L) 6.2(L)  Total Bilirubin 0.20 - 1.20 mg/dL 0.33 0.40 0.44  Alkaline Phos 40 - 150 U/L 82 87 77  AST 5 - 34 U/L 30 33 29  ALT 0 - 55 U/L _1 CEA  Status: Finalresult Visible to patient:  MyChart Nextappt: Today at 08:15 AM in Oncology [facility] 1) Dx:  Small bowel cancer ([place])              Ref Range 2wk ago  49moago  261mogo     CEA 0.0 - 5.0 ng/mL 9.6 (H) 5.0 6.0 (H)         Pathology report:  Small Intestine Biopsy, jejunal mass 08/18/2014 - INVASIVE ADENOCARCINOMA IN A BACKGROUND OF TUBULOVILLOUS ADENOMA. ADDITIONAL INFORMATION: Mismatch Repair (MMR) Protein Immunohistochemistry (IHC) IHC Expression Result (LIMITED TUMOR): MLH1: Preserved nuclear expression (greater 50% tumor expression) MSH2: Preserved nuclear expression (greater 50% tumor expression) MSH6: Preserved nuclear expression (greater 50% tumor expression) PMS2: Preserved nuclear expression (greater 50% tumor expression) * Internal control demonstrates intact nuclear expression Interpretation: NORMAL There is preserved expression  Diagnosis 08/28/2014  Liver, needle/core biopsy, right lobe - METASTATIC ADENOCARCINOMA    RADIOGRAPHIC STUDIES: I have personally reviewed the radiological images as listed and agreed with the findings in the report.  Nm Pet Image Initial (pi) Skull Base To Thigh 09/05/2014    IMPRESSION: Hypermetabolic mass involving a small bowel loop with adjacent mesenteric lymphadenopathy in the left abdomen, consistent with known primary small bowel carcinoma. No evidence of small bowel obstruction.  Diffuse liver metastases. Mild hypermetabolic lymphadenopathy in porta hepatis, also suspicious for metastatic disease.  No evidence of metastatic disease within the neck, chest, or pelvis.  Incidental findings including cholelithiasis, diverticulosis, and mildly enlarged prostate.   Electronically Signed   By: JoEarle Gell.D.   On: 09/05/2014 17:29  PET 02/09/2015 IMPRESSION: 1. No appreciable metabolic change at the site of the persistently hypermetabolic primary malignancy in the proximal jejunum. 2. Mild metabolic progression of the three residual hypermetabolic liver metastases. 3. No new sites of hypermetabolic metastatic disease. 4. Worsening near complete left sphenoid sinus opacification. Enlarging mucous retention cyst in the right maxillary sinus. 5. Cholelithiasis.    ASSESSMENT & PLAN:  69 year old gentleman with past medical history of diabetes, hypertension, who presents with anemia and weakness.  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  -Unfortunately he has stage IV disease with diffuse liver metastasis, this is an incurable disease, with overall very poor prognosis. -he has had excellent response to first-line FOLFOX, however his neuropathy has been getting worse, G2 now, so I recommend him to change to second line therapy.  -I discussed the recent PET/CT findings with pt and reviewed all images with them in person. He does  have slightly increased hypermetabolic activity in his liver lesions, however the CT portion of the scan showed significant decrease in the size of the liver metastasis. I spoke with radiology, and they will addendum the report. His CEA level are also stable and remains to be low, I think overall he is still responding to treatment, but possible early resistant clones are developing. -given his overall good performance status, his chemo was changed to FOLFIRI and panitumumab. He developed severe skin rash. Panitumumab was subsequently held. -He has recovered better from his last cycle chemotherapy 4 weeks ago, lab reviewed, adequate for treatment, he will resume chemotherapy tomorrow. -Given the significant neutropenia, I reduced his chemo dose  -he is neutropenic today, ANC 0.6, will postpone his chemo from tomorrow to next week.  -We'll obtain a restaging CT scan with contrast in a week.  2. Microcytic anemia secondary to GI bleeding and iron deficiency -His serum iron level and saturation were low, although ferritin is normal, he has some degree of iron deficient anemia, and anemia of malignancy  -Consider blood transfusion if hemoglobin less than 8 or symptomatic anemia -he received IV feraheme 510 mg twice in 09/2014, repeated iron study was normal in 02/2015  -His hemoglobin is  9.2, stable  3.  Hypertension and DM  -He will continue follow-up with his primary care physician. -We discussed that we will monitor his blood pressure and blood sugar closely and may need to adjust his medication during the chemotherapy   5. CKD -improved lately, Cr 1.1 today with EGFR 68  6. Fatigue, anorexia -improved after chemo break, stable lately    Plan: -postpone cycle 5 chemo to next week -Restaging CT chest, abdomen and pelvis with contrast next Monday before cycle 5 chemotherapy -I'll see him next week   Truitt Merle  05/07/2015

## 2015-05-08 ENCOUNTER — Ambulatory Visit: Payer: Medicare Other

## 2015-05-14 ENCOUNTER — Ambulatory Visit (HOSPITAL_COMMUNITY)
Admission: RE | Admit: 2015-05-14 | Discharge: 2015-05-14 | Disposition: A | Discharge status: Home / Self Care | Payer: Medicare Other | Source: Ambulatory Visit | Attending: Hematology | Admitting: Hematology

## 2015-05-14 ENCOUNTER — Encounter (HOSPITAL_COMMUNITY): Payer: Self-pay

## 2015-05-14 DIAGNOSIS — C179 Malignant neoplasm of small intestine, unspecified: Secondary | ICD-10-CM | POA: Insufficient documentation

## 2015-05-14 DIAGNOSIS — M5136 Other intervertebral disc degeneration, lumbar region: Secondary | ICD-10-CM | POA: Diagnosis not present

## 2015-05-14 DIAGNOSIS — C787 Secondary malignant neoplasm of liver and intrahepatic bile duct: Secondary | ICD-10-CM | POA: Diagnosis not present

## 2015-05-14 DIAGNOSIS — N400 Enlarged prostate without lower urinary tract symptoms: Secondary | ICD-10-CM | POA: Insufficient documentation

## 2015-05-14 DIAGNOSIS — I700 Atherosclerosis of aorta: Secondary | ICD-10-CM | POA: Insufficient documentation

## 2015-05-14 DIAGNOSIS — R918 Other nonspecific abnormal finding of lung field: Secondary | ICD-10-CM | POA: Insufficient documentation

## 2015-05-14 MED ORDER — IOHEXOL 300 MG/ML  SOLN
100.0000 mL | Freq: Once | INTRAMUSCULAR | Status: AC | PRN
  Administered 2015-05-14: 100 mL via INTRAVENOUS

## 2015-05-15 ENCOUNTER — Other Ambulatory Visit (HOSPITAL_BASED_OUTPATIENT_CLINIC_OR_DEPARTMENT_OTHER): Payer: Medicare Other

## 2015-05-15 ENCOUNTER — Encounter: Payer: Self-pay | Admitting: Hematology

## 2015-05-15 ENCOUNTER — Telehealth: Payer: Self-pay | Admitting: Hematology

## 2015-05-15 ENCOUNTER — Ambulatory Visit (HOSPITAL_BASED_OUTPATIENT_CLINIC_OR_DEPARTMENT_OTHER): Payer: Medicare Other | Admitting: Hematology

## 2015-05-15 ENCOUNTER — Ambulatory Visit (HOSPITAL_BASED_OUTPATIENT_CLINIC_OR_DEPARTMENT_OTHER): Payer: Medicare Other

## 2015-05-15 VITALS — BP 133/57 | HR 62 | Temp 98.0°F | Resp 18 | Ht 66.0 in | Wt 207.4 lb

## 2015-05-15 DIAGNOSIS — C787 Secondary malignant neoplasm of liver and intrahepatic bile duct: Secondary | ICD-10-CM | POA: Diagnosis not present

## 2015-05-15 DIAGNOSIS — C772 Secondary and unspecified malignant neoplasm of intra-abdominal lymph nodes: Secondary | ICD-10-CM

## 2015-05-15 DIAGNOSIS — C179 Malignant neoplasm of small intestine, unspecified: Secondary | ICD-10-CM | POA: Diagnosis not present

## 2015-05-15 DIAGNOSIS — E119 Type 2 diabetes mellitus without complications: Secondary | ICD-10-CM

## 2015-05-15 DIAGNOSIS — D509 Iron deficiency anemia, unspecified: Secondary | ICD-10-CM

## 2015-05-15 DIAGNOSIS — R5383 Other fatigue: Secondary | ICD-10-CM

## 2015-05-15 DIAGNOSIS — I10 Essential (primary) hypertension: Secondary | ICD-10-CM

## 2015-05-15 DIAGNOSIS — D500 Iron deficiency anemia secondary to blood loss (chronic): Secondary | ICD-10-CM

## 2015-05-15 DIAGNOSIS — Z5111 Encounter for antineoplastic chemotherapy: Secondary | ICD-10-CM | POA: Diagnosis not present

## 2015-05-15 DIAGNOSIS — R630 Anorexia: Secondary | ICD-10-CM

## 2015-05-15 DIAGNOSIS — D701 Agranulocytosis secondary to cancer chemotherapy: Secondary | ICD-10-CM

## 2015-05-15 LAB — COMPREHENSIVE METABOLIC PANEL
ALBUMIN: 3 g/dL — AB (ref 3.5–5.0)
ALK PHOS: 92 U/L (ref 40–150)
ALT: 22 U/L (ref 0–55)
ANION GAP: 7 meq/L (ref 3–11)
AST: 30 U/L (ref 5–34)
BUN: 10.7 mg/dL (ref 7.0–26.0)
CO2: 26 mEq/L (ref 22–29)
Calcium: 8.8 mg/dL (ref 8.4–10.4)
Chloride: 109 mEq/L (ref 98–109)
Creatinine: 1.1 mg/dL (ref 0.7–1.3)
EGFR: 65 mL/min/{1.73_m2} — AB (ref >90)
GLUCOSE: 89 mg/dL (ref 70–140)
POTASSIUM: 3.7 meq/L (ref 3.5–5.1)
SODIUM: 142 meq/L (ref 136–145)
Total Bilirubin: 0.34 mg/dL (ref 0.20–1.20)
Total Protein: 6.5 g/dL (ref 6.4–8.3)

## 2015-05-15 LAB — CBC & DIFF AND RETIC
BASO%: 0.3 % (ref 0.0–2.0)
Basophils Absolute: 0 10*3/uL (ref 0.0–0.1)
EOS%: 0.9 % (ref 0.0–7.0)
Eosinophils Absolute: 0 10*3/uL (ref 0.0–0.5)
HCT: 30.2 % — ABNORMAL LOW (ref 38.4–49.9)
HGB: 9.3 g/dL — ABNORMAL LOW (ref 13.0–17.1)
Immature Retic Fract: 19.7 % — ABNORMAL HIGH (ref 3.00–10.60)
LYMPH%: 44.5 % (ref 14.0–49.0)
MCH: 28.7 pg (ref 27.2–33.4)
MCHC: 30.8 g/dL — ABNORMAL LOW (ref 32.0–36.0)
MCV: 93.2 fL (ref 79.3–98.0)
MONO#: 0.7 10*3/uL (ref 0.1–0.9)
MONO%: 20 % — ABNORMAL HIGH (ref 0.0–14.0)
NEUT#: 1.1 10*3/uL — ABNORMAL LOW (ref 1.5–6.5)
NEUT%: 34.3 % — ABNORMAL LOW (ref 39.0–75.0)
Platelets: 145 10*3/uL (ref 140–400)
RBC: 3.24 10*6/uL — ABNORMAL LOW (ref 4.20–5.82)
RDW: 16.1 % — ABNORMAL HIGH (ref 11.0–14.6)
Retic %: 1.42 % (ref 0.80–1.80)
Retic Ct Abs: 46.01 10*3/uL (ref 34.80–93.90)
WBC: 3.3 10*3/uL — ABNORMAL LOW (ref 4.0–10.3)
lymph#: 1.5 10*3/uL (ref 0.9–3.3)

## 2015-05-15 MED ORDER — LEUCOVORIN CALCIUM INJECTION 350 MG: 400.0000 mg/m2 | Freq: Once | INTRAVENOUS | Status: DC

## 2015-05-15 MED ORDER — SODIUM CHLORIDE 0.9 % IV SOLN
Freq: Once | INTRAVENOUS | Status: AC
  Administered 2015-05-15: 09:00:00 via INTRAVENOUS

## 2015-05-15 MED ORDER — ATROPINE SULFATE 1 MG/ML IJ SOLN
INTRAMUSCULAR | Status: AC
  Filled 2015-05-15: qty 1

## 2015-05-15 MED ORDER — ATROPINE SULFATE 1 MG/ML IJ SOLN
0.5000 mg | Freq: Once | INTRAMUSCULAR | Status: AC | PRN
  Administered 2015-05-15: 0.5 mg via INTRAVENOUS

## 2015-05-15 MED ORDER — LEUCOVORIN CALCIUM INJECTION 350 MG
400.0000 mg/m2 | Freq: Once | INTRAMUSCULAR | Status: AC
  Administered 2015-05-15: 848 mg via INTRAVENOUS
  Filled 2015-05-15: qty 42.4

## 2015-05-15 MED ORDER — SODIUM CHLORIDE 0.9 % IV SOLN
2000.0000 mg/m2 | INTRAVENOUS | Status: DC
  Administered 2015-05-15: 4250 mg via INTRAVENOUS
  Filled 2015-05-15: qty 85

## 2015-05-15 MED ORDER — IRINOTECAN HCL CHEMO INJECTION 100 MG/5ML: 165.0000 mg/m2 | Freq: Once | INTRAVENOUS | Status: DC

## 2015-05-15 MED ORDER — ONDANSETRON HCL 40 MG/20ML IJ SOLN
Freq: Once | INTRAMUSCULAR | Status: AC
Start: 2015-05-15 — End: 2015-05-15
  Administered 2015-05-15: 10:00:00 via INTRAVENOUS
  Filled 2015-05-15: qty 8

## 2015-05-15 MED ORDER — SODIUM CHLORIDE 0.9 % IV SOLN
165.0000 mg/m2 | Freq: Once | INTRAVENOUS | Status: AC
  Administered 2015-05-15: 350 mg via INTRAVENOUS
  Filled 2015-05-15: qty 17.5

## 2015-05-15 NOTE — Telephone Encounter (Signed)
Gave and printed appt sched adna vs for pt for DEC and Jan

## 2015-05-15 NOTE — Progress Notes (Signed)
Ok to treat with CBC/CMP per MD Burr Medico.

## 2015-05-15 NOTE — Progress Notes (Signed)
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    08/18/2014 Miscellaneous tumor KRAS mutation (-)   08/18/2014 Pathology Results Liver biopsy showed metastatic adenocarcinoma, consistent with GI primary   08/18/2014 Initial Diagnosis Small bowel cancer   08/18/2014 Procedure small bowel enteroscopy with biopsy by Dr. Benson Norway showed at the proximal jejunum there was evidence of abnormal mucosa and friability, biopsy was obtained.    09/05/2014 Imaging PET hypermetabolic mass involving a small bowel loop with adjacent mesenteric lymphadenopathy, and diffuse liver metastasis and mild Pallor metabolic lymphadenopathy in porta hepatis.   09/06/2014 - 02/01/2015 Chemotherapy mFOLFOX, stopped due to his neuropathy, and earlier mild disease progression on PET    09/07/2014 - 09/11/2014 Hospital Admission He was admitted for fever and GI bleeding. Received 3 units of RBC.   10/26/2014 Imaging Interval significant improvement in the primary small bowel mass, adjacent lymphadenopathy and extensive hepatic metastatic disease. No other new lesions.    02/09/2015 Imaging PET/CT scan showed stable primary malignancy in the proximal jejunum, mild metabolic progression of the 3 residual metabolic liver metastasis. CT  Portion showed decreased size of his liver metastasis.   02/27/2015 -  Chemotherapy second line chemo FOLFIRI, every 2 weeks, and panitumumab (held for second cycle due to severe skin rashes)       HISTORY OF PRESENTING ILLNESS:  Tyler Evans 69 y.o. male is here because of a recent diagnosis of small bowel cancer. He presented to his PCP on 08/17/2014 with complaints of feeling weak and a one month history of a cough. He was found to be anemic and was referred to the emergency department. Hemoglobin was found to be 7.6, MCV 76.6; creatinine was elevated at 1.74. Stool was Hemoccult positive.  CT abdomen/pelvis without contrast on 08/17/2014 showed multiple large masses within the liver, bulky in appearance. The largest measured approximately 5.7 cm. There appeared to be a focal filling defect within the proximal jejunum measuring approximately 2.5 x 1.7 cm. There was mild adjacent jejunal wall thickening. The lung bases were clear.  On 08/18/2014 he underwent a small bowel enteroscopy with biopsy by Dr. Benson Norway. The esophagus and gastric lumen were normal. At the proximal jejunum there was evidence of abnormal mucosa and friability. The pediatric colonoscope was not able to traverse the area. Biopsies were obtained. An ultraslim colonoscope was then utilized. This colonoscope was able to traverse the area of stenosis which measured approximately 3 cm in length. 50% of the lumen was ulcerated. 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 MMR stains are pending.  He was discharged home on 08/19/2014.  CURRENT THERAPY: FOLFIRI with 5FU $Remove'2400mg'amGKLWr$ /m2 (decresed to $RemoveBefo'2000mg'OTQXBGDEHOb$ /m2 from cycle 4) over 46 hours and irinotecan $RemoveBefore'180mg'VoVYVVsbiLBRQ$ /m2 (decreased to $RemoveBefor'165mg'vCTZFlOgDCng$ /m2 from cycle 4), every 2 weeks.   INTERIM HISTORY:  Tyler Evans returns for follow-up. He has been tolerating chemo very well, no significant nausea, diarrhea, or other side effects. He has good appetite and energy level,  has been working in his greenhouse. 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 His weight being stable. No fever or chills. His chemotherapy last week was postponed to today due to the  neutropenia.  MEDICAL HISTORY:  Past Medical History  Diagnosis Date  . Diabetes mellitus without complication ([place])   . Hypertension   . Small bowel cancer ([place]) 08/18/2014    SURGICAL HISTORY: Past Surgical History  Procedure Laterality Date  . Enteroscopy N/A 08/18/2014    Procedure: ENTEROSCOPY;  Surgeon: Carol Ada, MD;  Location: [facility];  Service: Endoscopy;  Laterality: N/A;    SOCIAL HISTORY: History   Social History  . Marital Status: Married    Spouse Name: N/A  . Number of Children: 2  . Years of Education: N/A   Occupational History  .  retired Ambulance person    Social History Main Topics  . Smoking status: Never Smoker   . Smokeless tobacco: Not on file  . Alcohol Use: No  . Drug Use: Not on file  . Sexual Activity: Not on file   Other Topics Concern  . Not on file   Social History Narrative  He lives in [place]. He is married. He has 2 children both reported to be in good health. No tobacco or alcohol use. He is a retired Ambulance person.  FAMILY HISTORY: Family History  Problem Relation Age of Onset  . Heart failure, Alzheimer's  Mother   . Lung cancer Father   . Kidney cancer Brother     ALLERGIES:  is allergic to penicillins.  MEDICATIONS:  Current Outpatient Prescriptions  Medication Sig Dispense Refill  . atorvastatin (LIPITOR) 20 MG tablet Take 20 mg by mouth daily.    Marland Kitchen glipiZIDE (GLUCOTROL) 10 MG tablet Take 10 mg by mouth 2 (two) times daily before a meal.    . insulin glargine (LANTUS) 100 UNIT/ML injection Inject 40 Units into the skin at bedtime.    . pantoprazole (PROTONIX) 40 MG tablet Take 1 tablet (40 mg total) by mouth daily. 30 tablet 0  . chlorthalidone (HYGROTON) 25 MG tablet Take 25 mg by mouth daily.    Marland Kitchen lisinopril (PRINIVIL,ZESTRIL) 40 MG tablet Take 40 mg by mouth at bedtime.    . metFORMIN (GLUCOPHAGE) 1000 MG tablet Take 1,000 mg by mouth 2 (two) times daily with a meal.         REVIEW OF SYSTEMS:    Constitutional: Denies fevers, chills or abnormal night sweats; mild fatigue, weight is stable.  Eyes: Denies blurriness of vision, double vision or watery eyes Ears, nose, mouth, throat, and face: Denies mucositis or sore throat Respiratory: One month history of a nonproductive cough;  no shortness of breath Cardiovascular: Denies palpitation, chest discomfort or lower extremity swelling Gastrointestinal:  Denies nausea, heartburn. No dysphagia. He has noted less frequent bowel movements over the past 2 weeks. Does not characterize this as constipation. Skin: Denies abnormal skin rashes Lymphatics: Denies new lymphadenopathy or easy bruising Neurological: Denies numbness, tingling. No extremity weakness Behavioral/Psych: Mood is stable, no new changes  All other systems were reviewed with the patient and are negative.  PHYSICAL EXAMINATION: ECOG PERFORMANCE STATUS: 1  Filed Vitals:   05/15/15 0846  BP: 133/57  Pulse: 62  Temp: 98 F (36.7 C)  Resp: 18   Filed Weights   05/15/15 0846  Weight: 207 lb 6.4 oz (94.076 kg)    GENERAL:alert, no distress and comfortable SKIN: skin color, texture, turgor are normal, (+) skin pigmentation from  his rash on his face, neck, chest and upper abdomen.  EYES: normal, conjunctiva are pink and non-injected, sclera clear OROPHARYNX:no exudate, no erythema and lips, buccal mucosa, and tongue normal  NECK: supple, thyroid normal size, non-tender, without nodularity LYMPH:  no palpable lymphadenopathy in the cervical, axillary or inguinal regions LUNGS: clear to auscultation and percussion with normal breathing effort HEART: regular rate & rhythm and no murmurs and no lower extremity edema ABDOMEN:abdomen soft, mild tenderness at the right upper quadrant , no rebound pain, normal bowel sounds Musculoskeletal:no cyanosis of digits and no clubbing  PSYCH: alert & oriented x 3 with fluent speech NEURO: no focal motor deficits, his light touch  sensation and vibration sensation are diminished on his hands and feet.   LABORATORY DATA:  I have reviewed the data as listed CBC Latest Ref Rng 05/15/2015 05/07/2015 04/23/2015  WBC 4.0 - 10.3 10e3/uL 3.3(L) 1.9(L) 2.5(L)  Hemoglobin 13.0 - 17.1 g/dL 9.3(L) 9.2(L) 9.3(L)  Hematocrit 38.4 - 49.9 % 30.2(L) 29.6(L) 30.1(L)  Platelets 140 - 400 10e3/uL 145 98(L) 97(L)    CMP Latest Ref Rng 05/15/2015 05/07/2015 04/23/2015  Glucose 70 - 140 mg/dl 89 137 78  BUN 7.0 - 26.0 mg/dL 10.7 14.2 11.7  Creatinine 0.7 - 1.3 mg/dL 1.1 1.1 1.0  Sodium 136 - 145 mEq/L 142 142 142  Potassium 3.5 - 5.1 mEq/L 3.7 4.1 4.1  Chloride 96 - 112 mmol/L - - -  CO2 22 - 29 mEq/L $Remove'26 23 26  'obKHymv$ Calcium 8.4 - 10.4 mg/dL 8.8 8.9 8.9  Total Protein 6.4 - 8.3 g/dL 6.5 6.4 6.3(L)  Total Bilirubin 0.20 - 1.20 mg/dL 0.34 0.33 0.40  Alkaline Phos 40 - 150 U/L 92 82 87  AST 5 - 34 U/L 30 30 33  ALT 0 - 55 U/L $Remo'22 20 20   'swmZV$ CEA  Status: Finalresult Visible to patient:  MyChart Nextappt: Today at 08:15 AM in [facility]) Dx:  Small bowel cancer ([place])              Ref Range 2wk ago  66mo ago  35mo ago     CEA 0.0 - 5.0 ng/mL 9.6 (H) 5.0 6.0 (H)         Pathology report:  Small Intestine Biopsy, jejunal mass 08/18/2014 - INVASIVE ADENOCARCINOMA IN A BACKGROUND OF TUBULOVILLOUS ADENOMA. ADDITIONAL INFORMATION: Mismatch Repair (MMR) Protein Immunohistochemistry (IHC) IHC Expression Result (LIMITED TUMOR): MLH1: Preserved nuclear expression (greater 50% tumor expression) MSH2: Preserved nuclear expression (greater 50% tumor expression) MSH6: Preserved nuclear expression (greater 50% tumor expression) PMS2: Preserved nuclear expression (greater 50% tumor expression) * Internal control demonstrates intact nuclear expression Interpretation: NORMAL There is preserved expression  Diagnosis 08/28/2014  Liver, needle/core biopsy, right lobe - METASTATIC ADENOCARCINOMA   RADIOGRAPHIC  STUDIES: I have personally reviewed the radiological images as listed and agreed with the findings in the report.  Nm Pet Image Initial (pi) Skull Base To Thigh 09/05/2014    IMPRESSION: Hypermetabolic mass involving a small bowel loop with adjacent mesenteric lymphadenopathy in the left abdomen, consistent with known primary small bowel carcinoma. No evidence of small bowel obstruction.  Diffuse liver metastases. Mild hypermetabolic lymphadenopathy in porta hepatis, also suspicious for metastatic disease.  No evidence of metastatic disease within the neck, chest, or pelvis.  Incidental findings including cholelithiasis, diverticulosis, and mildly enlarged prostate.   Electronically Signed   By: Earle Gell M.D.   On: 09/05/2014 17:29   CT chest, abdomen and pelvis with contrast  05/14/2015 IMPRESSION: 1. Interval improvement in the appearance of multi focal liver metastasis. 2. Several small nonspecific pulmonary nodules are again noted. There is a nodule in the left lower lobe which is minimally increased in size measuring 5 mm, versus 3 mm previously. Attention on follow-up exam recommended.   ASSESSMENT & PLAN:  69 year old gentleman with past medical history of diabetes, hypertension, who presents with anemia and weakness.  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  -Unfortunately he has stage IV disease with diffuse liver metastasis, this is an incurable disease, with overall very poor prognosis. -Is currently on second line FOLFIRI now, tolerating well, dose decreased from cycle 4 due to neutropenia. -I reviewed his staging CT scan results with patient and his wife, he has had a good partial response in the liver metastasis, no other new lesions. He is clinically doing well -Lab reviewed, neutropenia improved, ANC 1.1 today, we'll proceed  chemotherapy today, with Neulasta support on day 3.   2. Microcytic anemia secondary to GI bleeding and iron deficiency -His serum iron level and saturation were low, although ferritin is normal, he has some degree of iron deficient anemia, and anemia of malignancy  -Consider blood transfusion if hemoglobin less than 8 or symptomatic anemia -he received IV feraheme 510 mg twice in 09/2014, repeated iron study was normal in 02/2015  -His hemoglobin is  9.3, stable  3.  Hypertension and DM  -He will continue follow-up with his primary care physician. -We discussed that we will monitor his blood pressure and blood sugar closely and may need to adjust his medication during the chemotherapy   4. Neutropenia, secondary to chemotherapy -Improved. Neutropenia fever precaution was reviewed with patient and his wife. -Continue close monitoring.  5. Fatigue, anorexia -much improved lately    Plan: -chemo today and continue every 2 weeks, add on Neulasta on day 3 -I'll see him back in 4 weeks  Truitt Merle  05/15/2015

## 2015-05-15 NOTE — Patient Instructions (Signed)
[facility] Discharge Instructions for Patients Receiving Chemotherapy  Today you received the following chemotherapy agents FOLFIRI.  To help prevent nausea and vomiting after your treatment, we encourage you to take your nausea medication as directed.    If you develop nausea and vomiting that is not controlled by your nausea medication, call the clinic.   BELOW ARE SYMPTOMS THAT SHOULD BE REPORTED IMMEDIATELY:  *FEVER GREATER THAN 100.5 F  *CHILLS WITH OR WITHOUT FEVER  NAUSEA AND VOMITING THAT IS NOT CONTROLLED WITH YOUR NAUSEA MEDICATION  *UNUSUAL SHORTNESS OF BREATH  *UNUSUAL BRUISING OR BLEEDING  TENDERNESS IN MOUTH AND THROAT WITH OR WITHOUT PRESENCE OF ULCERS  *URINARY PROBLEMS  *BOWEL PROBLEMS  UNUSUAL RASH Items with * indicate a potential emergency and should be followed up as soon as possible.  Feel free to call the clinic you have any questions or concerns. The clinic phone number is [phone].  Please show the CHEMO ALERT CARD at check-in to the Emergency Department and triage nurse.   

## 2015-05-17 ENCOUNTER — Ambulatory Visit: Payer: Medicare Other

## 2015-05-17 ENCOUNTER — Ambulatory Visit (HOSPITAL_BASED_OUTPATIENT_CLINIC_OR_DEPARTMENT_OTHER): Payer: Medicare Other

## 2015-05-17 DIAGNOSIS — Z5189 Encounter for other specified aftercare: Secondary | ICD-10-CM

## 2015-05-17 DIAGNOSIS — C179 Malignant neoplasm of small intestine, unspecified: Secondary | ICD-10-CM | POA: Diagnosis not present

## 2015-05-17 MED ORDER — SODIUM CHLORIDE 0.9 % IJ SOLN
10.0000 mL | INTRAMUSCULAR | Status: DC | PRN
  Administered 2015-05-17: 10 mL
  Filled 2015-05-17: qty 10

## 2015-05-17 MED ORDER — PEGFILGRASTIM INJECTION 6 MG/0.6ML ~~LOC~~
6.0000 mg | PREFILLED_SYRINGE | Freq: Once | SUBCUTANEOUS | Status: AC
  Administered 2015-05-17: 6 mg via SUBCUTANEOUS
  Filled 2015-05-17: qty 0.6

## 2015-05-17 MED ORDER — HEPARIN SOD (PORK) LOCK FLUSH 100 UNIT/ML IV SOLN
500.0000 [IU] | Freq: Once | INTRAVENOUS | Status: AC | PRN
  Administered 2015-05-17: 500 [IU]
  Filled 2015-05-17: qty 5

## 2015-05-17 NOTE — Patient Instructions (Signed)
Pegfilgrastim injection What is this medicine? PEGFILGRASTIM (peg fil GRA stim) is a long-acting granulocyte colony-stimulating factor that stimulates the growth of neutrophils, a type of white blood cell important in the body's fight against infection. It is used to reduce the incidence of fever and infection in patients with certain types of cancer who are receiving chemotherapy that affects the bone marrow. This medicine may be used for other purposes; ask your health care provider or pharmacist if you have questions. COMMON BRAND NAME(S): Neulasta What should I tell my health care provider before I take this medicine? They need to know if you have any of these conditions: -latex allergy -ongoing radiation therapy -sickle cell disease -skin reactions to acrylic adhesives (On-Body Injector only) -an unusual or allergic reaction to pegfilgrastim, filgrastim, other medicines, foods, dyes, or preservatives -pregnant or trying to get pregnant -breast-feeding How should I use this medicine? This medicine is for injection under the skin. If you get this medicine at home, you will be taught how to prepare and give the pre-filled syringe or how to use the On-body Injector. Refer to the patient Instructions for Use for detailed instructions. Use exactly as directed. Take your medicine at regular intervals. Do not take your medicine more often than directed. It is important that you put your used needles and syringes in a special sharps container. Do not put them in a trash can. If you do not have a sharps container, call your pharmacist or healthcare provider to get one. Talk to your pediatrician regarding the use of this medicine in children. Special care may be needed. Overdosage: If you think you have taken too much of this medicine contact a poison control center or emergency room at once. NOTE: This medicine is only for you. Do not share this medicine with others. What if I miss a dose? It is  important not to miss your dose. Call your doctor or health care professional if you miss your dose. If you miss a dose due to an On-body Injector failure or leakage, a new dose should be administered as soon as possible using a single prefilled syringe for manual use. What may interact with this medicine? Interactions have not been studied. Give your health care provider a list of all the medicines, herbs, non-prescription drugs, or dietary supplements you use. Also tell them if you smoke, drink alcohol, or use illegal drugs. Some items may interact with your medicine. This list may not describe all possible interactions. Give your health care provider a list of all the medicines, herbs, non-prescription drugs, or dietary supplements you use. Also tell them if you smoke, drink alcohol, or use illegal drugs. Some items may interact with your medicine. What should I watch for while using this medicine? You may need blood work done while you are taking this medicine. If you are going to need a MRI, CT scan, or other procedure, tell your doctor that you are using this medicine (On-Body Injector only). What side effects may I notice from receiving this medicine? Side effects that you should report to your doctor or health care professional as soon as possible: -allergic reactions like skin rash, itching or hives, swelling of the face, lips, or tongue -dizziness -fever -pain, redness, or irritation at site where injected -pinpoint red spots on the skin -shortness of breath or breathing problems -stomach or side pain, or pain at the shoulder -swelling -tiredness -trouble passing urine Side effects that usually do not require medical attention (report to your doctor   or health care professional if they continue or are bothersome): -bone pain -muscle pain This list may not describe all possible side effects. Call your doctor for medical advice about side effects. You may report side effects to FDA at  1-800-FDA-1088. Where should I keep my medicine? Keep out of the reach of children. Store pre-filled syringes in a refrigerator between 2 and 8 degrees C (36 and 46 degrees F). Do not freeze. Keep in carton to protect from light. Throw away this medicine if it is left out of the refrigerator for more than 48 hours. Throw away any unused medicine after the expiration date. NOTE: This sheet is a summary. It may not cover all possible information. If you have questions about this medicine, talk to your doctor, pharmacist, or health care provider.  2015, Elsevier/Gold Standard. (2013-08-18 16:14:05)  

## 2015-05-17 NOTE — Progress Notes (Signed)
Neulasta injection given by infusion nurse after pump discontinued. 

## 2015-05-21 ENCOUNTER — Ambulatory Visit: Payer: Medicare Other | Admitting: Hematology

## 2015-05-21 ENCOUNTER — Other Ambulatory Visit: Payer: Medicare Other

## 2015-05-21 ENCOUNTER — Ambulatory Visit: Payer: Medicare Other

## 2015-05-27 ENCOUNTER — Other Ambulatory Visit: Payer: Self-pay | Admitting: Hematology

## 2015-05-29 ENCOUNTER — Other Ambulatory Visit (HOSPITAL_BASED_OUTPATIENT_CLINIC_OR_DEPARTMENT_OTHER): Payer: Medicare Other

## 2015-05-29 ENCOUNTER — Ambulatory Visit (HOSPITAL_BASED_OUTPATIENT_CLINIC_OR_DEPARTMENT_OTHER): Payer: Medicare Other

## 2015-05-29 ENCOUNTER — Encounter: Payer: Self-pay | Admitting: *Deleted

## 2015-05-29 VITALS — BP 127/57 | HR 61 | Temp 97.8°F | Resp 17

## 2015-05-29 DIAGNOSIS — C179 Malignant neoplasm of small intestine, unspecified: Secondary | ICD-10-CM

## 2015-05-29 DIAGNOSIS — Z5111 Encounter for antineoplastic chemotherapy: Secondary | ICD-10-CM | POA: Diagnosis not present

## 2015-05-29 LAB — IRON AND TIBC
%SAT: 25 % (ref 20–55)
Iron: 68 ug/dL (ref 42–163)
TIBC: 270 ug/dL (ref 202–409)
UIBC: 202 ug/dL (ref 117–376)

## 2015-05-29 LAB — CBC WITH DIFFERENTIAL/PLATELET
BASO%: 0.2 % (ref 0.0–2.0)
BASOS ABS: 0 10*3/uL (ref 0.0–0.1)
EOS%: 1.4 % (ref 0.0–7.0)
Eosinophils Absolute: 0.1 10*3/uL (ref 0.0–0.5)
HCT: 29.5 % — ABNORMAL LOW (ref 38.4–49.9)
HGB: 9.5 g/dL — ABNORMAL LOW (ref 13.0–17.1)
LYMPH%: 27.8 % (ref 14.0–49.0)
MCH: 29.1 pg (ref 27.2–33.4)
MCHC: 32 g/dL (ref 32.0–36.0)
MCV: 90.7 fL (ref 79.3–98.0)
MONO#: 0.8 10*3/uL (ref 0.1–0.9)
MONO%: 12.7 % (ref 0.0–14.0)
NEUT#: 3.7 10*3/uL (ref 1.5–6.5)
NEUT%: 57.9 % (ref 39.0–75.0)
Platelets: 130 10*3/uL — ABNORMAL LOW (ref 140–400)
RBC: 3.25 10*6/uL — AB (ref 4.20–5.82)
RDW: 16.8 % — AB (ref 11.0–14.6)
WBC: 6.3 10*3/uL (ref 4.0–10.3)
lymph#: 1.8 10*3/uL (ref 0.9–3.3)

## 2015-05-29 LAB — COMPREHENSIVE METABOLIC PANEL
ALBUMIN: 3 g/dL — AB (ref 3.5–5.0)
ALK PHOS: 102 U/L (ref 40–150)
ALT: 25 U/L (ref 0–55)
AST: 28 U/L (ref 5–34)
Anion Gap: 8 mEq/L (ref 3–11)
BUN: 11.1 mg/dL (ref 7.0–26.0)
CHLORIDE: 107 meq/L (ref 98–109)
CO2: 26 meq/L (ref 22–29)
Calcium: 8.9 mg/dL (ref 8.4–10.4)
Creatinine: 1.2 mg/dL (ref 0.7–1.3)
EGFR: 63 mL/min/{1.73_m2} — AB (ref >90)
GLUCOSE: 93 mg/dL (ref 70–140)
POTASSIUM: 3.9 meq/L (ref 3.5–5.1)
SODIUM: 141 meq/L (ref 136–145)
Total Bilirubin: <0.3 mg/dL (ref 0.20–1.20)
Total Protein: 6.6 g/dL (ref 6.4–8.3)

## 2015-05-29 LAB — FERRITIN: Ferritin: 384 ng/ml — ABNORMAL HIGH (ref 22–316)

## 2015-05-29 LAB — CEA: CEA: 17.6 ng/mL — ABNORMAL HIGH (ref 0.0–5.0)

## 2015-05-29 MED ORDER — ATROPINE SULFATE 1 MG/ML IJ SOLN
0.5000 mg | Freq: Once | INTRAMUSCULAR | Status: AC | PRN
  Administered 2015-05-29: 0.5 mg via INTRAVENOUS

## 2015-05-29 MED ORDER — SODIUM CHLORIDE 0.9 % IV SOLN
2000.0000 mg/m2 | INTRAVENOUS | Status: DC
  Administered 2015-05-29: 4250 mg via INTRAVENOUS
  Filled 2015-05-29: qty 85

## 2015-05-29 MED ORDER — IRINOTECAN HCL CHEMO INJECTION 100 MG/5ML
165.0000 mg/m2 | Freq: Once | INTRAVENOUS | Status: AC
  Administered 2015-05-29: 350 mg via INTRAVENOUS
  Filled 2015-05-29: qty 17.5

## 2015-05-29 MED ORDER — LEUCOVORIN CALCIUM INJECTION 100 MG
20.0000 mg/m2 | Freq: Once | INTRAMUSCULAR | Status: AC
  Administered 2015-05-29: 42 mg via INTRAVENOUS
  Filled 2015-05-29: qty 2.1

## 2015-05-29 MED ORDER — ATROPINE SULFATE 1 MG/ML IJ SOLN
INTRAMUSCULAR | Status: AC
  Filled 2015-05-29: qty 1

## 2015-05-29 MED ORDER — SODIUM CHLORIDE 0.9 % IV SOLN
Freq: Once | INTRAVENOUS | Status: AC
  Administered 2015-05-29: 09:00:00 via INTRAVENOUS

## 2015-05-29 MED ORDER — SODIUM CHLORIDE 0.9 % IV SOLN
Freq: Once | INTRAVENOUS | Status: AC
  Administered 2015-05-29: 09:00:00 via INTRAVENOUS
  Filled 2015-05-29: qty 8

## 2015-05-29 NOTE — Patient Instructions (Signed)
[facility] Discharge Instructions for Patients Receiving Chemotherapy  Today you received the following chemotherapy agents Leucovorin, Irinotecan and Fluorouracil  To help prevent nausea and vomiting after your treatment, we encourage you to take your nausea medication as directed.   If you develop nausea and vomiting that is not controlled by your nausea medication, call the clinic.   BELOW ARE SYMPTOMS THAT SHOULD BE REPORTED IMMEDIATELY:  *FEVER GREATER THAN 100.5 F  *CHILLS WITH OR WITHOUT FEVER  NAUSEA AND VOMITING THAT IS NOT CONTROLLED WITH YOUR NAUSEA MEDICATION  *UNUSUAL SHORTNESS OF BREATH  *UNUSUAL BRUISING OR BLEEDING  TENDERNESS IN MOUTH AND THROAT WITH OR WITHOUT PRESENCE OF ULCERS  *URINARY PROBLEMS  *BOWEL PROBLEMS  UNUSUAL RASH Items with * indicate a potential emergency and should be followed up as soon as possible.  Feel free to call the clinic you have any questions or concerns. The clinic phone number is [phone].  Please show the CHEMO ALERT CARD at check-in to the Emergency Department and triage nurse.   

## 2015-05-29 NOTE — Progress Notes (Signed)
Oncology Nurse Navigator Documentation  Oncology Nurse Navigator Flowsheets 05/29/2015  Navigator Encounter Type Treatment/7 month f/u  Patient Visit Type -  Treatment Phase -  Barriers/Navigation Needs No barriers at this time  Interventions None required  Support Groups/Services -  Time Spent with Patient -  Panitumumab has been discontined, so his rash is feeling better. Has no navigation needs at this time.

## 2015-05-31 ENCOUNTER — Ambulatory Visit: Payer: Medicare Other

## 2015-05-31 ENCOUNTER — Ambulatory Visit (HOSPITAL_BASED_OUTPATIENT_CLINIC_OR_DEPARTMENT_OTHER): Payer: Medicare Other

## 2015-05-31 VITALS — BP 109/83 | HR 66 | Temp 98.2°F | Resp 16

## 2015-05-31 DIAGNOSIS — Z5189 Encounter for other specified aftercare: Secondary | ICD-10-CM

## 2015-05-31 DIAGNOSIS — C179 Malignant neoplasm of small intestine, unspecified: Secondary | ICD-10-CM | POA: Diagnosis not present

## 2015-05-31 MED ORDER — PEGFILGRASTIM INJECTION 6 MG/0.6ML ~~LOC~~
6.0000 mg | PREFILLED_SYRINGE | Freq: Once | SUBCUTANEOUS | Status: AC
  Administered 2015-05-31: 6 mg via SUBCUTANEOUS
  Filled 2015-05-31: qty 0.6

## 2015-05-31 MED ORDER — SODIUM CHLORIDE 0.9 % IJ SOLN
10.0000 mL | INTRAMUSCULAR | Status: DC | PRN
  Administered 2015-05-31: 10 mL
  Filled 2015-05-31: qty 10

## 2015-05-31 MED ORDER — HEPARIN SOD (PORK) LOCK FLUSH 100 UNIT/ML IV SOLN
500.0000 [IU] | Freq: Once | INTRAVENOUS | Status: AC | PRN
  Administered 2015-05-31: 500 [IU]
  Filled 2015-05-31: qty 5

## 2015-05-31 NOTE — Progress Notes (Signed)
Neulasta injection given by infusion nurse 

## 2015-06-12 ENCOUNTER — Ambulatory Visit (HOSPITAL_BASED_OUTPATIENT_CLINIC_OR_DEPARTMENT_OTHER): Payer: Medicare Other

## 2015-06-12 ENCOUNTER — Other Ambulatory Visit (HOSPITAL_BASED_OUTPATIENT_CLINIC_OR_DEPARTMENT_OTHER): Payer: Medicare Other

## 2015-06-12 ENCOUNTER — Ambulatory Visit: Payer: Medicare Other | Admitting: Hematology

## 2015-06-12 ENCOUNTER — Ambulatory Visit (HOSPITAL_BASED_OUTPATIENT_CLINIC_OR_DEPARTMENT_OTHER): Payer: Medicare Other | Admitting: Hematology

## 2015-06-12 ENCOUNTER — Other Ambulatory Visit: Payer: Medicare Other

## 2015-06-12 ENCOUNTER — Telehealth: Payer: Self-pay | Admitting: Hematology

## 2015-06-12 ENCOUNTER — Encounter: Payer: Self-pay | Admitting: Hematology

## 2015-06-12 VITALS — BP 109/39 | HR 64 | Temp 97.6°F | Resp 20 | Ht 66.0 in | Wt 203.8 lb

## 2015-06-12 DIAGNOSIS — C179 Malignant neoplasm of small intestine, unspecified: Secondary | ICD-10-CM

## 2015-06-12 DIAGNOSIS — G620 Drug-induced polyneuropathy: Secondary | ICD-10-CM | POA: Insufficient documentation

## 2015-06-12 DIAGNOSIS — E119 Type 2 diabetes mellitus without complications: Secondary | ICD-10-CM

## 2015-06-12 DIAGNOSIS — C772 Secondary and unspecified malignant neoplasm of intra-abdominal lymph nodes: Secondary | ICD-10-CM

## 2015-06-12 DIAGNOSIS — D509 Iron deficiency anemia, unspecified: Secondary | ICD-10-CM

## 2015-06-12 DIAGNOSIS — D6481 Anemia due to antineoplastic chemotherapy: Secondary | ICD-10-CM | POA: Diagnosis not present

## 2015-06-12 DIAGNOSIS — D630 Anemia in neoplastic disease: Secondary | ICD-10-CM

## 2015-06-12 DIAGNOSIS — C787 Secondary malignant neoplasm of liver and intrahepatic bile duct: Secondary | ICD-10-CM

## 2015-06-12 DIAGNOSIS — Z5111 Encounter for antineoplastic chemotherapy: Secondary | ICD-10-CM | POA: Diagnosis not present

## 2015-06-12 DIAGNOSIS — I10 Essential (primary) hypertension: Secondary | ICD-10-CM

## 2015-06-12 DIAGNOSIS — D500 Iron deficiency anemia secondary to blood loss (chronic): Secondary | ICD-10-CM

## 2015-06-12 DIAGNOSIS — G622 Polyneuropathy due to other toxic agents: Secondary | ICD-10-CM

## 2015-06-12 DIAGNOSIS — T451X5A Adverse effect of antineoplastic and immunosuppressive drugs, initial encounter: Secondary | ICD-10-CM

## 2015-06-12 LAB — COMPREHENSIVE METABOLIC PANEL
ALBUMIN: 3 g/dL — AB (ref 3.5–5.0)
ALK PHOS: 106 U/L (ref 40–150)
ALT: 22 U/L (ref 0–55)
ANION GAP: 10 meq/L (ref 3–11)
AST: 26 U/L (ref 5–34)
BUN: 11.9 mg/dL (ref 7.0–26.0)
CALCIUM: 8.7 mg/dL (ref 8.4–10.4)
CHLORIDE: 110 meq/L — AB (ref 98–109)
CO2: 25 mEq/L (ref 22–29)
CREATININE: 1.2 mg/dL (ref 0.7–1.3)
EGFR: 62 mL/min/{1.73_m2} — ABNORMAL LOW (ref >90)
Glucose: 87 mg/dl (ref 70–140)
POTASSIUM: 3.6 meq/L (ref 3.5–5.1)
Sodium: 145 mEq/L (ref 136–145)
Total Bilirubin: <0.3 mg/dL (ref 0.20–1.20)
Total Protein: 6.1 g/dL — ABNORMAL LOW (ref 6.4–8.3)

## 2015-06-12 LAB — CBC WITH DIFFERENTIAL/PLATELET
BASO%: 0.2 % (ref 0.0–2.0)
BASOS ABS: 0 10*3/uL (ref 0.0–0.1)
EOS%: 0.9 % (ref 0.0–7.0)
Eosinophils Absolute: 0.1 10*3/uL (ref 0.0–0.5)
HEMATOCRIT: 28.9 % — AB (ref 38.4–49.9)
HEMOGLOBIN: 9 g/dL — AB (ref 13.0–17.1)
LYMPH#: 1.9 10*3/uL (ref 0.9–3.3)
LYMPH%: 21 % (ref 14.0–49.0)
MCH: 28.8 pg (ref 27.2–33.4)
MCHC: 31.1 g/dL — AB (ref 32.0–36.0)
MCV: 92.3 fL (ref 79.3–98.0)
MONO#: 1 10*3/uL — AB (ref 0.1–0.9)
MONO%: 11.7 % (ref 0.0–14.0)
NEUT#: 5.8 10*3/uL (ref 1.5–6.5)
NEUT%: 66.2 % (ref 39.0–75.0)
PLATELETS: 124 10*3/uL — AB (ref 140–400)
RBC: 3.13 10*6/uL — ABNORMAL LOW (ref 4.20–5.82)
RDW: 17.8 % — AB (ref 11.0–14.6)
WBC: 8.8 10*3/uL (ref 4.0–10.3)

## 2015-06-12 MED ORDER — SODIUM CHLORIDE 0.9 % IV SOLN
Freq: Once | INTRAVENOUS | Status: AC
  Administered 2015-06-12: 10:00:00 via INTRAVENOUS

## 2015-06-12 MED ORDER — ATROPINE SULFATE 1 MG/ML IJ SOLN
INTRAMUSCULAR | Status: AC
  Filled 2015-06-12: qty 1

## 2015-06-12 MED ORDER — ATROPINE SULFATE 1 MG/ML IJ SOLN
0.5000 mg | Freq: Once | INTRAMUSCULAR | Status: AC | PRN
  Administered 2015-06-12: 0.5 mg via INTRAVENOUS

## 2015-06-12 MED ORDER — LEUCOVORIN CALCIUM INJECTION 100 MG
20.0000 mg/m2 | Freq: Once | INTRAMUSCULAR | Status: AC
  Administered 2015-06-12: 42 mg via INTRAVENOUS
  Filled 2015-06-12: qty 2.1

## 2015-06-12 MED ORDER — SODIUM CHLORIDE 0.9 % IV SOLN
Freq: Once | INTRAVENOUS | Status: AC
  Administered 2015-06-12: 10:00:00 via INTRAVENOUS
  Filled 2015-06-12: qty 8

## 2015-06-12 MED ORDER — FLUOROURACIL CHEMO INJECTION 5 GM/100ML
2000.0000 mg/m2 | INTRAVENOUS | Status: DC
  Administered 2015-06-12: 4250 mg via INTRAVENOUS
  Filled 2015-06-12: qty 85

## 2015-06-12 MED ORDER — SODIUM CHLORIDE 0.9 % IV SOLN
165.0000 mg/m2 | Freq: Once | INTRAVENOUS | Status: AC
  Administered 2015-06-12: 350 mg via INTRAVENOUS
  Filled 2015-06-12: qty 17.5

## 2015-06-12 NOTE — Telephone Encounter (Signed)
Talked to patient here in office. Scheduled appt.       [facility]. °

## 2015-06-12 NOTE — Progress Notes (Signed)
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    08/18/2014 Miscellaneous tumor KRAS mutation (-)   08/18/2014 Pathology Results Liver biopsy showed metastatic adenocarcinoma, consistent with GI primary   08/18/2014 Initial Diagnosis Small bowel cancer   08/18/2014 Procedure small bowel enteroscopy with biopsy by Dr. Benson Norway showed at the proximal jejunum there was evidence of abnormal mucosa and friability, biopsy was obtained.    09/05/2014 Imaging PET hypermetabolic mass involving a small bowel loop with adjacent mesenteric lymphadenopathy, and diffuse liver metastasis and mild Pallor metabolic lymphadenopathy in porta hepatis.   09/06/2014 - 02/01/2015 Chemotherapy mFOLFOX, stopped due to his neuropathy, and earlier mild disease progression on PET    09/07/2014 - 09/11/2014 Hospital Admission He was admitted for fever and GI bleeding. Received 3 units of RBC.   10/26/2014 Imaging Interval significant improvement in the primary small bowel mass, adjacent lymphadenopathy and extensive hepatic metastatic disease. No other new lesions.    02/09/2015 Imaging PET/CT scan showed stable primary malignancy in the proximal jejunum, mild metabolic progression of the 3 residual metabolic liver metastasis. CT  Portion showed decreased size of his liver metastasis.   02/27/2015 -  Chemotherapy second line chemo FOLFIRI, every 2 weeks, and panitumumab (held for second cycle due to severe skin rashes)       HISTORY OF PRESENTING ILLNESS:  Tyler Evans 70 y.o. male is here because of a recent diagnosis of small bowel cancer. He presented to his PCP on 08/17/2014 with complaints of feeling weak and a one month history of a cough. He was found to be anemic and was referred to the emergency department. Hemoglobin was found to be 7.6, MCV 76.6; creatinine was elevated at 1.74. Stool was Hemoccult positive.  CT abdomen/pelvis without contrast on 08/17/2014 showed multiple large masses within the liver, bulky in appearance. The largest measured approximately 5.7 cm. There appeared to be a focal filling defect within the proximal jejunum measuring approximately 2.5 x 1.7 cm. There was mild adjacent jejunal wall thickening. The lung bases were clear.  On 08/18/2014 he underwent a small bowel enteroscopy with biopsy by Dr. Benson Norway. The esophagus and gastric lumen were normal. At the proximal jejunum there was evidence of abnormal mucosa and friability. The pediatric colonoscope was not able to traverse the area. Biopsies were obtained. An ultraslim colonoscope was then utilized. This colonoscope was able to traverse the area of stenosis which measured approximately 3 cm in length. 50% of the lumen was ulcerated. 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 MMR stains are pending.  He was discharged home on 08/19/2014.  CURRENT THERAPY: FOLFIRI with 5FU '2400mg'$ /m2 (decresed to '2000mg'$ /m2 from cycle 4) over 46 hours and irinotecan '180mg'$ /m2 (decreased to '165mg'$ /m2 from cycle 4), every 2 weeks.   INTERIM HISTORY:  Mr. Spink returns for follow-up. He is accompanied by his wife to the clinic today. He has been doing pretty well lately. He tolerated the last cycle  chemotherapy very well, no significant side effects. He has good appetite and energy level, functions very well at home. He does still have mild peripheral neuropathy, with numbness and tingling on the fingers and  toes, slightly better overall. He has mild difficulty on writing and buttoning. He has no other complaints.   MEDICAL HISTORY:  Past Medical History  Diagnosis Date  . Diabetes mellitus without complication ([place])   . Hypertension   . Small bowel cancer ([place]) 08/18/2014    SURGICAL HISTORY: Past Surgical History  Procedure Laterality Date  . Enteroscopy N/A 08/18/2014    Procedure: ENTEROSCOPY;  Surgeon: Carol Ada, MD;  Location: [facility];  Service: Endoscopy;  Laterality: N/A;    SOCIAL HISTORY: History   Social History  . Marital Status: Married    Spouse Name: N/A  . Number of Children: 2  . Years of Education: N/A   Occupational History  .  retired Ambulance person    Social History Main Topics  . Smoking status: Never Smoker   . Smokeless tobacco: Not on file  . Alcohol Use: No  . Drug Use: Not on file  . Sexual Activity: Not on file   Other Topics Concern  . Not on file   Social History Narrative  He lives in [place]. He is married. He has 2 children both reported to be in good health. No tobacco or alcohol use. He is a retired Ambulance person.  FAMILY HISTORY: Family History  Problem Relation Age of Onset  . Heart failure, Alzheimer's  Mother   . Lung cancer Father   . Kidney cancer Brother     ALLERGIES:  is allergic to penicillins.  MEDICATIONS:  Current Outpatient Prescriptions  Medication Sig Dispense Refill  . atorvastatin (LIPITOR) 20 MG tablet Take 20 mg by mouth daily.    Marland Kitchen glipiZIDE (GLUCOTROL) 10 MG tablet Take 10 mg by mouth 2 (two) times daily before a meal.    . insulin glargine (LANTUS) 100 UNIT/ML injection Inject 40 Units into the skin at bedtime.    . pantoprazole (PROTONIX) 40 MG tablet Take 1 tablet (40 mg total) by mouth daily. 30 tablet 0  . chlorthalidone (HYGROTON) 25 MG tablet Take 25 mg by mouth daily.    Marland Kitchen lisinopril (PRINIVIL,ZESTRIL) 40 MG tablet Take 40 mg by mouth at bedtime.    . metFORMIN (GLUCOPHAGE) 1000 MG  tablet Take 1,000 mg by mouth 2 (two) times daily with a meal.         REVIEW OF SYSTEMS:   Constitutional: Denies fevers, chills or abnormal night sweats; mild fatigue, weight is stable.  Eyes: Denies blurriness of vision, double vision or watery eyes Ears, nose, mouth, throat, and face: Denies mucositis or sore throat Respiratory: One month history of a nonproductive cough;  no shortness of breath Cardiovascular: Denies palpitation, chest discomfort or lower extremity swelling Gastrointestinal:  Denies nausea, heartburn. No dysphagia. He has noted less frequent bowel movements over the past 2 weeks. Does not characterize this as constipation. Skin: Denies abnormal skin rashes Lymphatics: Denies new lymphadenopathy or easy bruising Neurological: Denies numbness, tingling. No extremity weakness Behavioral/Psych: Mood is stable, no new changes  All other systems were reviewed with the patient and are negative.  PHYSICAL EXAMINATION: ECOG PERFORMANCE STATUS: 1  Filed Vitals:   06/12/15 0819  BP: 109/39  Pulse: 64  Temp: 97.6 F (36.4 C)  Resp: 20   Filed Weights   06/12/15 0819  Weight: 203 lb 12.8 oz (92.443 kg)  GENERAL:alert, no distress and comfortable SKIN: skin color, texture, turgor are normal, (+) skin pigmentation from his rash on his face, neck, chest and upper abdomen.  EYES: normal, conjunctiva are pink and non-injected, sclera clear OROPHARYNX:no exudate, no erythema and lips, buccal mucosa, and tongue normal  NECK: supple, thyroid normal size, non-tender, without nodularity LYMPH:  no palpable lymphadenopathy in the cervical, axillary or inguinal regions LUNGS: clear to auscultation and percussion with normal breathing effort HEART: regular rate & rhythm and no murmurs and no lower extremity edema ABDOMEN:abdomen soft, mild tenderness at the right upper quadrant , no rebound pain, normal bowel sounds Musculoskeletal:no cyanosis of digits and no clubbing   PSYCH: alert & oriented x 3 with fluent speech NEURO: no focal motor deficits, his light touch sensation and vibration sensation are diminished on his hands and feet.   LABORATORY DATA:  I have reviewed the data as listed CBC Latest Ref Rng 06/12/2015 05/29/2015 05/15/2015  WBC 4.0 - 10.3 10e3/uL 8.8 6.3 3.3(L)  Hemoglobin 13.0 - 17.1 g/dL 9.0(L) 9.5(L) 9.3(L)  Hematocrit 38.4 - 49.9 % 28.9(L) 29.5(L) 30.2(L)  Platelets 140 - 400 10e3/uL 124(L) 130(L) 145    CMP Latest Ref Rng 05/29/2015 05/15/2015 05/07/2015  Glucose 70 - 140 mg/dl 93 89 137  BUN 7.0 - 26.0 mg/dL 11.1 10.7 14.2  Creatinine 0.7 - 1.3 mg/dL 1.2 1.1 1.1  Sodium 136 - 145 mEq/L 141 142 142  Potassium 3.5 - 5.1 mEq/L 3.9 3.7 4.1  Chloride 96 - 112 mmol/L - - -  CO2 22 - 29 mEq/L '26 26 23  '$ Calcium 8.4 - 10.4 mg/dL 8.9 8.8 8.9  Total Protein 6.4 - 8.3 g/dL 6.6 6.5 6.4  Total Bilirubin 0.20 - 1.20 mg/dL <0.30 0.34 0.33  Alkaline Phos 40 - 150 U/L 102 92 82  AST 5 - 34 U/L '28 30 30  '$ ALT 0 - 55 U/L '25 22 20   '$ CEA  Status: Finalresult Visible to patient:  MyChart Nextappt: Today at 09:30 AM in Oncology ([place]) Dx:  Small bowel cancer ([place])              Ref Range 2wk ago  68moago  280mogo  6m62moo     CEA 0.0 - 5.0 ng/mL 17.6 (H) 9.6 (H) 5.0 6.0 (H)          Pathology report:  Small Intestine Biopsy, jejunal mass 08/18/2014 - INVASIVE ADENOCARCINOMA IN A BACKGROUND OF TUBULOVILLOUS ADENOMA. ADDITIONAL INFORMATION: Mismatch Repair (MMR) Protein Immunohistochemistry (IHC) IHC Expression Result (LIMITED TUMOR): MLH1: Preserved nuclear expression (greater 50% tumor expression) MSH2: Preserved nuclear expression (greater 50% tumor expression) MSH6: Preserved nuclear expression (greater 50% tumor expression) PMS2: Preserved nuclear expression (greater 50% tumor expression) * Internal control demonstrates intact nuclear expression Interpretation: NORMAL There is preserved  expression  Diagnosis 08/28/2014  Liver, needle/core biopsy, right lobe - METASTATIC ADENOCARCINOMA   RADIOGRAPHIC STUDIES: I have personally reviewed the radiological images as listed and agreed with the findings in the report.  Nm Pet Image Initial (pi) Skull Base To Thigh 09/05/2014    IMPRESSION: Hypermetabolic mass involving a small bowel loop with adjacent mesenteric lymphadenopathy in the left abdomen, consistent with known primary small bowel carcinoma. No evidence of small bowel obstruction.  Diffuse liver metastases. Mild hypermetabolic lymphadenopathy in porta hepatis, also suspicious for metastatic disease.  No evidence of metastatic disease within the neck, chest, or pelvis.  Incidental findings including cholelithiasis, diverticulosis, and mildly enlarged prostate.   Electronically Signed  By: Earle Gell M.D.   On: 09/05/2014 17:29   CT chest, abdomen and pelvis with contrast 05/14/2015 IMPRESSION: 1. Interval improvement in the appearance of multi focal liver metastasis. 2. Several small nonspecific pulmonary nodules are again noted. There is a nodule in the left lower lobe which is minimally increased in size measuring 5 mm, versus 3 mm previously. Attention on follow-up exam recommended.   ASSESSMENT & PLAN:  70 year old gentleman with past medical history of diabetes, hypertension, who presents with anemia and weakness.  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  -Unfortunately he has stage IV disease with diffuse liver metastasis, this is an incurable disease, with overall very poor prognosis. -Is currently on second line FOLFIRI now, tolerating well, dose decreased from cycle 4 due to neutropenia. -I reviewed his staging CT scan results with patient and his wife, he has had a good partial response in the liver metastasis,  no other new lesions. He is clinically doing well -his CEA Has been slowly trending up, but overall level is still low, we'll continue monitoring. -Repeat restaging PET scan in mid to late February.  2. Microcytic anemia secondary to GI bleeding, iron deficiency and chemotherapy -His serum iron level and saturation were low, although ferritin is normal, he has some degree of iron deficient anemia, and anemia of malignancy  -Consider blood transfusion if hemoglobin less than 8 or symptomatic anemia -he received IV feraheme 510 mg twice in 09/2014, repeated iron study was normal in 05/2015  -overall stable, continue monitoring   3.  Hypertension and DM  -He will continue follow-up with his primary care physician. -We discussed that we will monitor his blood pressure and blood sugar closely and may need to adjust his medication during the chemotherapy   4. Peripheral neuropathy, secondary to chemotherapy -Overall improved. Stable lately. -Continue Neurontin -We'll continue observation.  Plan: -lab reviewed, CBC adequate for treatment, CMP still pending, Cycle 7 FOLFIRI today and continue every 2 weeks, Neulasta on day 3 -see App Lisa in 2 weeks, and me in 4 weeks, one day before his chemo   Truitt Merle  06/12/2015

## 2015-06-12 NOTE — Progress Notes (Signed)
Blood return noted before and after Leucovorin push.  

## 2015-06-12 NOTE — Patient Instructions (Signed)
Cancer Center Discharge Instructions for Patients Receiving Chemotherapy  Today you received the following chemotherapy agents Irinotecan, Leucovorin,and Adrucil  To help prevent nausea and vomiting after your treatment, we encourage you to take your nausea medication as directed   If you develop nausea and vomiting that is not controlled by your nausea medication, call the clinic.   BELOW ARE SYMPTOMS THAT SHOULD BE REPORTED IMMEDIATELY:  *FEVER GREATER THAN 100.5 F  *CHILLS WITH OR WITHOUT FEVER  NAUSEA AND VOMITING THAT IS NOT CONTROLLED WITH YOUR NAUSEA MEDICATION  *UNUSUAL SHORTNESS OF BREATH  *UNUSUAL BRUISING OR BLEEDING  TENDERNESS IN MOUTH AND THROAT WITH OR WITHOUT PRESENCE OF ULCERS  *URINARY PROBLEMS  *BOWEL PROBLEMS  UNUSUAL RASH Items with * indicate a potential emergency and should be followed up as soon as possible.  Feel free to call the clinic you have any questions or concerns. The clinic phone number is [phone].  Please show the CHEMO ALERT CARD at check-in to the Emergency Department and triage nurse.   

## 2015-06-14 ENCOUNTER — Ambulatory Visit: Payer: Medicare Other

## 2015-06-14 ENCOUNTER — Ambulatory Visit (HOSPITAL_BASED_OUTPATIENT_CLINIC_OR_DEPARTMENT_OTHER): Payer: Medicare Other

## 2015-06-14 VITALS — BP 118/47 | HR 66 | Temp 98.4°F

## 2015-06-14 DIAGNOSIS — C179 Malignant neoplasm of small intestine, unspecified: Secondary | ICD-10-CM | POA: Diagnosis not present

## 2015-06-14 DIAGNOSIS — Z5189 Encounter for other specified aftercare: Secondary | ICD-10-CM | POA: Diagnosis not present

## 2015-06-14 MED ORDER — PEGFILGRASTIM INJECTION 6 MG/0.6ML ~~LOC~~
6.0000 mg | PREFILLED_SYRINGE | Freq: Once | SUBCUTANEOUS | Status: AC
Start: 2015-06-14 — End: 2015-06-14
  Administered 2015-06-14: 6 mg via SUBCUTANEOUS
  Filled 2015-06-14: qty 0.6

## 2015-06-14 MED ORDER — HEPARIN SOD (PORK) LOCK FLUSH 100 UNIT/ML IV SOLN
500.0000 [IU] | Freq: Once | INTRAVENOUS | Status: AC | PRN
  Administered 2015-06-14: 500 [IU]
  Filled 2015-06-14: qty 5

## 2015-06-14 MED ORDER — SODIUM CHLORIDE 0.9 % IJ SOLN
10.0000 mL | INTRAMUSCULAR | Status: DC | PRN
  Administered 2015-06-14: 10 mL
  Filled 2015-06-14: qty 10

## 2015-06-14 NOTE — Patient Instructions (Signed)

## 2015-06-14 NOTE — Progress Notes (Signed)
Pump disconnect by injection nurse today.

## 2015-06-25 ENCOUNTER — Telehealth: Payer: Self-pay | Admitting: Hematology

## 2015-06-25 ENCOUNTER — Telehealth: Payer: Self-pay | Admitting: *Deleted

## 2015-06-25 ENCOUNTER — Other Ambulatory Visit (HOSPITAL_BASED_OUTPATIENT_CLINIC_OR_DEPARTMENT_OTHER): Payer: Medicare Other

## 2015-06-25 ENCOUNTER — Ambulatory Visit (HOSPITAL_BASED_OUTPATIENT_CLINIC_OR_DEPARTMENT_OTHER): Payer: Medicare Other | Admitting: Nurse Practitioner

## 2015-06-25 VITALS — BP 110/50 | HR 79 | Temp 98.0°F | Resp 18 | Ht 66.0 in | Wt 200.4 lb

## 2015-06-25 DIAGNOSIS — C179 Malignant neoplasm of small intestine, unspecified: Secondary | ICD-10-CM

## 2015-06-25 DIAGNOSIS — C787 Secondary malignant neoplasm of liver and intrahepatic bile duct: Secondary | ICD-10-CM

## 2015-06-25 DIAGNOSIS — C772 Secondary and unspecified malignant neoplasm of intra-abdominal lymph nodes: Secondary | ICD-10-CM | POA: Diagnosis not present

## 2015-06-25 DIAGNOSIS — N182 Chronic kidney disease, stage 2 (mild): Secondary | ICD-10-CM

## 2015-06-25 DIAGNOSIS — D6959 Other secondary thrombocytopenia: Secondary | ICD-10-CM

## 2015-06-25 DIAGNOSIS — E119 Type 2 diabetes mellitus without complications: Secondary | ICD-10-CM

## 2015-06-25 DIAGNOSIS — I10 Essential (primary) hypertension: Secondary | ICD-10-CM

## 2015-06-25 DIAGNOSIS — D509 Iron deficiency anemia, unspecified: Secondary | ICD-10-CM

## 2015-06-25 LAB — COMPREHENSIVE METABOLIC PANEL
ALBUMIN: 3.1 g/dL — AB (ref 3.5–5.0)
ALK PHOS: 119 U/L (ref 40–150)
ALT: 20 U/L (ref 0–55)
ANION GAP: 7 meq/L (ref 3–11)
AST: 26 U/L (ref 5–34)
BUN: 11.3 mg/dL (ref 7.0–26.0)
CALCIUM: 8.5 mg/dL (ref 8.4–10.4)
CHLORIDE: 109 meq/L (ref 98–109)
CO2: 26 mEq/L (ref 22–29)
CREATININE: 1.1 mg/dL (ref 0.7–1.3)
EGFR: 65 mL/min/{1.73_m2} — ABNORMAL LOW (ref >90)
Glucose: 90 mg/dl (ref 70–140)
POTASSIUM: 3.8 meq/L (ref 3.5–5.1)
Sodium: 142 mEq/L (ref 136–145)
Total Bilirubin: 0.33 mg/dL (ref 0.20–1.20)
Total Protein: 6.3 g/dL — ABNORMAL LOW (ref 6.4–8.3)

## 2015-06-25 LAB — CBC WITH DIFFERENTIAL/PLATELET
BASO%: 0.2 % (ref 0.0–2.0)
Basophils Absolute: 0 10*3/uL (ref 0.0–0.1)
EOS%: 0.6 % (ref 0.0–7.0)
Eosinophils Absolute: 0.1 10*3/uL (ref 0.0–0.5)
HEMATOCRIT: 28 % — AB (ref 38.4–49.9)
HEMOGLOBIN: 8.8 g/dL — AB (ref 13.0–17.1)
LYMPH%: 18.1 % (ref 14.0–49.0)
MCH: 28.6 pg (ref 27.2–33.4)
MCHC: 31.4 g/dL — AB (ref 32.0–36.0)
MCV: 90.9 fL (ref 79.3–98.0)
MONO#: 1.3 10*3/uL — ABNORMAL HIGH (ref 0.1–0.9)
MONO%: 13.6 % (ref 0.0–14.0)
NEUT#: 6.6 10*3/uL — ABNORMAL HIGH (ref 1.5–6.5)
NEUT%: 67.5 % (ref 39.0–75.0)
PLATELETS: 151 10*3/uL (ref 140–400)
RBC: 3.08 10*6/uL — ABNORMAL LOW (ref 4.20–5.82)
RDW: 19 % — ABNORMAL HIGH (ref 11.0–14.6)
WBC: 9.7 10*3/uL (ref 4.0–10.3)
lymph#: 1.8 10*3/uL (ref 0.9–3.3)

## 2015-06-25 NOTE — Telephone Encounter (Signed)
per pof to sch pt appt-sent MW email to sch trmt-pt to get updated copy of sch on 1/24

## 2015-06-25 NOTE — Progress Notes (Signed)
  [facility] OFFICE PROGRESS NOTE   Diagnosis:  Small bowel cancer  INTERVAL HISTORY:   [name] returns as scheduled. He completed cycle 7 FOLFIRI 06/12/2015. He denies nausea/vomiting. No mouth sores. No diarrhea. He denies pain. Overall good appetite. He remains active. No hand or foot pain or redness. Urine symptoms have improved since beginning Flomax.   Objective:  Vital signs in last 24 hours:  Blood pressure 110/50, pulse 79, temperature 98 F (36.7 C), temperature source Oral, resp. rate 18, height 5\' 6"  (1.676 m), weight 200 lb 6.4 oz (90.901 kg), SpO2 100 %.    HEENT: No thrush or ulcers. Resp: Lungs clear bilaterally. Cardio: Regular rate and rhythm. GI: Abdomen soft and nontender. No hepatomegaly. Vascular: No leg edema. Skin: Palms without erythema.    Lab Results:  Lab Results  Component Value Date   WBC 9.7 06/25/2015   HGB 8.8* 06/25/2015   HCT 28.0* 06/25/2015   MCV 90.9 06/25/2015   PLT 151 06/25/2015   NEUTROABS 6.6* 06/25/2015    Imaging:  No results found.  Medications: I have reviewed the patient's current medications.  Assessment/Plan: 1. 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  CT abdomen/pelvis without contrast 08/17/2014 with multiple large masses within the liver, bulky in appearance. The largest measured approximately 5.7 cm. Focal filling defect within the proximal jejunum measuring approximately 2.5 x 1.7 cm. Mild adjacent jejunal wall thickening.  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   Cycle 1 FOLFOX 09/06/2014  Cycle 2 FOLFOX 09/20/2014  Cycle 3 FOLFOX 10/04/2014  Cycle 4 FOLFOX 10/18/2014  Restaging PET scan 10/26/2014 with interval significant improvement in the primary small bowel mass, adjacent lymphadenopathy and extensive  hepatic metastatic disease. Small pulmonary nodules bilaterally unchanged. No evidence of disease progression.  Cycle 5 FOLFOX 11/08/2014  Cycle 6 FOLFOX 11/29/2014  Cycle 7 FOLFOX 12/18/2014  Cycle 8 FOLFOX 01/04/2015 (oxaliplatin dose reduced due to thrombocytopenia)  Cycle 9 FOLFOX 01/18/2015  Cycle 10 FOLFOX 02/01/2015 (oxaliplatin dose reduced due to neuropathy)  PET/CT scan 02/19/2015 with stable primary malignancy in the proximal jejunum, mild metabolic progression of 3 residual metabolic liver metastasis. CT portion showed decreased size of liver metastasis.  Cycle 1 FOLFIRI 02/27/2015  Cycle 2 FOLFIRI 03/13/2015  Cycle 3 FOLFIRI 04/10/2015  Cycle 4 FOLFIRI 04/23/2015  Restaging CT scans 05/14/2015 with interval improvement in appearance of multifocal liver metastasis. Several small nonspecific pulmonary nodules again noted. Nodule in the left lower lobe minimally increased.  Cycle 5 FOLFIRI 05/15/2015  Cycle 6 FOLFIRI 05/29/2015  Cycle 7 FOLFIRI 06/12/2015 2. Microcytic anemia in the setting of heme-positive stool status post red cell transfusion support 08/17/2014. 3. Anorexia/weight loss secondary to #1. 4. Hypertension. 5. Diabetes. 6. CKD stage III 7. Hospitalization 09/07/2014 through 09/11/2014 with fever/sepsis, culture negative. 8. Thrombocytopenia secondary to chemotherapy.   Disposition: Tyler Evans appears stable. He has completed 7 cycles of FOLFIRI. Plan to proceed with cycle 8 as scheduled 06/26/2015. He will return for a follow-up visit in 2 weeks. He will contact the office in the interim with any problems.    Ned Card ANP/GNP-BC   06/25/2015  11:56 AM

## 2015-06-25 NOTE — Telephone Encounter (Signed)
Per staff message and POF I have scheduled appts. Advised scheduler of appts. JMW  

## 2015-06-26 ENCOUNTER — Other Ambulatory Visit: Payer: Self-pay | Admitting: Hematology

## 2015-06-26 ENCOUNTER — Other Ambulatory Visit: Payer: Medicare Other

## 2015-06-26 ENCOUNTER — Ambulatory Visit (HOSPITAL_BASED_OUTPATIENT_CLINIC_OR_DEPARTMENT_OTHER): Payer: Medicare Other

## 2015-06-26 VITALS — BP 112/50 | HR 66 | Temp 97.7°F | Resp 16

## 2015-06-26 DIAGNOSIS — C179 Malignant neoplasm of small intestine, unspecified: Secondary | ICD-10-CM

## 2015-06-26 DIAGNOSIS — Z5111 Encounter for antineoplastic chemotherapy: Secondary | ICD-10-CM

## 2015-06-26 LAB — CEA: CEA1: 33.6 ng/mL — AB (ref 0.0–4.7)

## 2015-06-26 LAB — CEA (PARALLEL TESTING): CEA: 20.2 ng/mL — AB (ref 0.0–5.0)

## 2015-06-26 MED ORDER — SODIUM CHLORIDE 0.9 % IV SOLN
Freq: Once | INTRAVENOUS | Status: AC
  Administered 2015-06-26: 09:00:00 via INTRAVENOUS

## 2015-06-26 MED ORDER — LEUCOVORIN CALCIUM INJECTION 100 MG
20.0000 mg/m2 | Freq: Once | INTRAMUSCULAR | Status: AC
  Administered 2015-06-26: 42 mg via INTRAVENOUS
  Filled 2015-06-26: qty 2.1

## 2015-06-26 MED ORDER — SODIUM CHLORIDE 0.9 % IV SOLN
165.0000 mg/m2 | Freq: Once | INTRAVENOUS | Status: AC
  Administered 2015-06-26: 350 mg via INTRAVENOUS
  Filled 2015-06-26: qty 17.5

## 2015-06-26 MED ORDER — FLUOROURACIL CHEMO INJECTION 5 GM/100ML
2000.0000 mg/m2 | INTRAVENOUS | Status: DC
  Administered 2015-06-26: 4250 mg via INTRAVENOUS
  Filled 2015-06-26: qty 85

## 2015-06-26 MED ORDER — ATROPINE SULFATE 1 MG/ML IJ SOLN
INTRAMUSCULAR | Status: AC
  Filled 2015-06-26: qty 1

## 2015-06-26 MED ORDER — DEXAMETHASONE SODIUM PHOSPHATE 100 MG/10ML IJ SOLN
Freq: Once | INTRAMUSCULAR | Status: AC
  Administered 2015-06-26: 09:00:00 via INTRAVENOUS
  Filled 2015-06-26: qty 8

## 2015-06-26 MED ORDER — ATROPINE SULFATE 1 MG/ML IJ SOLN
0.5000 mg | Freq: Once | INTRAMUSCULAR | Status: AC | PRN
  Administered 2015-06-26: 0.5 mg via INTRAVENOUS

## 2015-06-26 NOTE — Patient Instructions (Signed)
[facility] Discharge Instructions for Patients Receiving Chemotherapy  Today you received the following chemotherapy agents:  Leucovorin, Irinotecan, Fluorouracil.  To help prevent nausea and vomiting after your treatment, we encourage you to take your nausea medication as directed.   If you develop nausea and vomiting that is not controlled by your nausea medication, call the clinic.   BELOW ARE SYMPTOMS THAT SHOULD BE REPORTED IMMEDIATELY:  *FEVER GREATER THAN 100.5 F  *CHILLS WITH OR WITHOUT FEVER  NAUSEA AND VOMITING THAT IS NOT CONTROLLED WITH YOUR NAUSEA MEDICATION  *UNUSUAL SHORTNESS OF BREATH  *UNUSUAL BRUISING OR BLEEDING  TENDERNESS IN MOUTH AND THROAT WITH OR WITHOUT PRESENCE OF ULCERS  *URINARY PROBLEMS  *BOWEL PROBLEMS  UNUSUAL RASH Items with * indicate a potential emergency and should be followed up as soon as possible.  Feel free to call the clinic you have any questions or concerns. The clinic phone number is [phone].  Please show the [facility] at check-in to the Emergency Department and triage nurse.

## 2015-06-28 ENCOUNTER — Ambulatory Visit (HOSPITAL_BASED_OUTPATIENT_CLINIC_OR_DEPARTMENT_OTHER): Payer: Medicare Other

## 2015-06-28 ENCOUNTER — Ambulatory Visit: Payer: Medicare Other

## 2015-06-28 VITALS — BP 122/41 | HR 61 | Temp 98.1°F | Resp 17

## 2015-06-28 DIAGNOSIS — C179 Malignant neoplasm of small intestine, unspecified: Secondary | ICD-10-CM | POA: Diagnosis not present

## 2015-06-28 DIAGNOSIS — Z5189 Encounter for other specified aftercare: Secondary | ICD-10-CM

## 2015-06-28 MED ORDER — PEGFILGRASTIM INJECTION 6 MG/0.6ML ~~LOC~~
6.0000 mg | PREFILLED_SYRINGE | Freq: Once | SUBCUTANEOUS | Status: AC
  Administered 2015-06-28: 6 mg via SUBCUTANEOUS
  Filled 2015-06-28: qty 0.6

## 2015-06-28 MED ORDER — HEPARIN SOD (PORK) LOCK FLUSH 100 UNIT/ML IV SOLN
500.0000 [IU] | Freq: Once | INTRAVENOUS | Status: AC | PRN
  Administered 2015-06-28: 500 [IU]
  Filled 2015-06-28: qty 5

## 2015-06-28 MED ORDER — SODIUM CHLORIDE 0.9 % IJ SOLN
10.0000 mL | INTRAMUSCULAR | Status: DC | PRN
  Administered 2015-06-28: 10 mL
  Filled 2015-06-28: qty 10

## 2015-06-28 NOTE — Patient Instructions (Signed)
Pegfilgrastim injection What is this medicine? PEGFILGRASTIM (PEG fil gra stim) is a long-acting granulocyte colony-stimulating factor that stimulates the growth of neutrophils, a type of white blood cell important in the body's fight against infection. It is used to reduce the incidence of fever and infection in patients with certain types of cancer who are receiving chemotherapy that affects the bone marrow, and to increase survival after being exposed to high doses of radiation. This medicine may be used for other purposes; ask your health care provider or pharmacist if you have questions. What should I tell my health care provider before I take this medicine? They need to know if you have any of these conditions: -kidney disease -latex allergy -ongoing radiation therapy -sickle cell disease -skin reactions to acrylic adhesives (On-Body Injector only) -an unusual or allergic reaction to pegfilgrastim, filgrastim, other medicines, foods, dyes, or preservatives -pregnant or trying to get pregnant -breast-feeding How should I use this medicine? This medicine is for injection under the skin. If you get this medicine at home, you will be taught how to prepare and give the pre-filled syringe or how to use the On-body Injector. Refer to the patient Instructions for Use for detailed instructions. Use exactly as directed. Take your medicine at regular intervals. Do not take your medicine more often than directed. It is important that you put your used needles and syringes in a special sharps container. Do not put them in a trash can. If you do not have a sharps container, call your pharmacist or healthcare provider to get one. Talk to your pediatrician regarding the use of this medicine in children. While this drug may be prescribed for selected conditions, precautions do apply. Overdosage: If you think you have taken too much of this medicine contact a poison control center or emergency room at  once. NOTE: This medicine is only for you. Do not share this medicine with others. What if I miss a dose? It is important not to miss your dose. Call your doctor or health care professional if you miss your dose. If you miss a dose due to an On-body Injector failure or leakage, a new dose should be administered as soon as possible using a single prefilled syringe for manual use. What may interact with this medicine? Interactions have not been studied. Give your health care provider a list of all the medicines, herbs, non-prescription drugs, or dietary supplements you use. Also tell them if you smoke, drink alcohol, or use illegal drugs. Some items may interact with your medicine. This list may not describe all possible interactions. Give your health care provider a list of all the medicines, herbs, non-prescription drugs, or dietary supplements you use. Also tell them if you smoke, drink alcohol, or use illegal drugs. Some items may interact with your medicine. What should I watch for while using this medicine? You may need blood work done while you are taking this medicine. If you are going to need a MRI, CT scan, or other procedure, tell your doctor that you are using this medicine (On-Body Injector only). What side effects may I notice from receiving this medicine? Side effects that you should report to your doctor or health care professional as soon as possible: -allergic reactions like skin rash, itching or hives, swelling of the face, lips, or tongue -dizziness -fever -pain, redness, or irritation at site where injected -pinpoint red spots on the skin -red or dark-brown urine -shortness of breath or breathing problems -stomach or side pain, or pain   at the shoulder -swelling -tiredness -trouble passing urine or change in the amount of urine Side effects that usually do not require medical attention (report to your doctor or health care professional if they continue or are  bothersome): -bone pain -muscle pain This list may not describe all possible side effects. Call your doctor for medical advice about side effects. You may report side effects to FDA at 1-800-FDA-1088. Where should I keep my medicine? Keep out of the reach of children. Store pre-filled syringes in a refrigerator between 2 and 8 degrees C (36 and 46 degrees F). Do not freeze. Keep in carton to protect from light. Throw away this medicine if it is left out of the refrigerator for more than 48 hours. Throw away any unused medicine after the expiration date. NOTE: This sheet is a summary. It may not cover all possible information. If you have questions about this medicine, talk to your doctor, pharmacist, or health care provider.    2016, Elsevier/Gold Standard. (2014-06-08 14:30:14)  

## 2015-06-28 NOTE — Progress Notes (Signed)
Neulasta injection given by flush nurse after home pump disconnected.

## 2015-07-09 ENCOUNTER — Ambulatory Visit (HOSPITAL_BASED_OUTPATIENT_CLINIC_OR_DEPARTMENT_OTHER): Payer: Medicare Other | Admitting: Hematology

## 2015-07-09 ENCOUNTER — Other Ambulatory Visit (HOSPITAL_BASED_OUTPATIENT_CLINIC_OR_DEPARTMENT_OTHER): Payer: Medicare Other

## 2015-07-09 ENCOUNTER — Encounter: Payer: Self-pay | Admitting: Hematology

## 2015-07-09 VITALS — BP 131/55 | HR 78 | Temp 98.6°F | Resp 18 | Ht 66.0 in | Wt 199.5 lb

## 2015-07-09 DIAGNOSIS — C171 Malignant neoplasm of jejunum: Secondary | ICD-10-CM

## 2015-07-09 DIAGNOSIS — C787 Secondary malignant neoplasm of liver and intrahepatic bile duct: Secondary | ICD-10-CM | POA: Diagnosis not present

## 2015-07-09 DIAGNOSIS — C179 Malignant neoplasm of small intestine, unspecified: Secondary | ICD-10-CM

## 2015-07-09 DIAGNOSIS — G622 Polyneuropathy due to other toxic agents: Secondary | ICD-10-CM

## 2015-07-09 DIAGNOSIS — D509 Iron deficiency anemia, unspecified: Secondary | ICD-10-CM

## 2015-07-09 DIAGNOSIS — C772 Secondary and unspecified malignant neoplasm of intra-abdominal lymph nodes: Secondary | ICD-10-CM

## 2015-07-09 DIAGNOSIS — I10 Essential (primary) hypertension: Secondary | ICD-10-CM

## 2015-07-09 DIAGNOSIS — D630 Anemia in neoplastic disease: Secondary | ICD-10-CM

## 2015-07-09 DIAGNOSIS — D6481 Anemia due to antineoplastic chemotherapy: Secondary | ICD-10-CM | POA: Diagnosis not present

## 2015-07-09 DIAGNOSIS — E119 Type 2 diabetes mellitus without complications: Secondary | ICD-10-CM

## 2015-07-09 DIAGNOSIS — D500 Iron deficiency anemia secondary to blood loss (chronic): Secondary | ICD-10-CM

## 2015-07-09 LAB — COMPREHENSIVE METABOLIC PANEL
ALT: 19 U/L (ref 0–55)
AST: 23 U/L (ref 5–34)
Albumin: 3 g/dL — ABNORMAL LOW (ref 3.5–5.0)
Alkaline Phosphatase: 106 U/L (ref 40–150)
Anion Gap: 9 mEq/L (ref 3–11)
BUN: 11.9 mg/dL (ref 7.0–26.0)
CO2: 26 meq/L (ref 22–29)
Calcium: 8.7 mg/dL (ref 8.4–10.4)
Chloride: 110 mEq/L — ABNORMAL HIGH (ref 98–109)
Creatinine: 1.2 mg/dL (ref 0.7–1.3)
EGFR: 63 mL/min/{1.73_m2} — AB (ref >90)
GLUCOSE: 210 mg/dL — AB (ref 70–140)
POTASSIUM: 3.5 meq/L (ref 3.5–5.1)
SODIUM: 144 meq/L (ref 136–145)
TOTAL PROTEIN: 6 g/dL — AB (ref 6.4–8.3)
Total Bilirubin: <0.3 mg/dL (ref 0.20–1.20)

## 2015-07-09 LAB — CBC WITH DIFFERENTIAL/PLATELET
BASO%: 0.4 % (ref 0.0–2.0)
BASOS ABS: 0 10*3/uL (ref 0.0–0.1)
EOS ABS: 0 10*3/uL (ref 0.0–0.5)
EOS%: 0.4 % (ref 0.0–7.0)
HEMATOCRIT: 24.7 % — AB (ref 38.4–49.9)
HEMOGLOBIN: 7.8 g/dL — AB (ref 13.0–17.1)
LYMPH#: 1 10*3/uL (ref 0.9–3.3)
LYMPH%: 19.6 % (ref 14.0–49.0)
MCH: 28.6 pg (ref 27.2–33.4)
MCHC: 31.5 g/dL — ABNORMAL LOW (ref 32.0–36.0)
MCV: 90.8 fL (ref 79.3–98.0)
MONO#: 0.7 10*3/uL (ref 0.1–0.9)
MONO%: 13.9 % (ref 0.0–14.0)
NEUT#: 3.5 10*3/uL (ref 1.5–6.5)
NEUT%: 65.7 % (ref 39.0–75.0)
PLATELETS: 131 10*3/uL — AB (ref 140–400)
RBC: 2.72 10*6/uL — AB (ref 4.20–5.82)
RDW: 19.8 % — ABNORMAL HIGH (ref 11.0–14.6)
WBC: 5.3 10*3/uL (ref 4.0–10.3)

## 2015-07-09 NOTE — Progress Notes (Signed)
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    08/18/2014 Miscellaneous tumor KRAS mutation (-)   08/18/2014 Pathology Results Liver biopsy showed metastatic adenocarcinoma, consistent with GI primary   08/18/2014 Initial Diagnosis Small bowel cancer   08/18/2014 Procedure small bowel enteroscopy with biopsy by Dr. Benson Norway showed at the proximal jejunum there was evidence of abnormal mucosa and friability, biopsy was obtained.    09/05/2014 Imaging PET hypermetabolic mass involving a small bowel loop with adjacent mesenteric lymphadenopathy, and diffuse liver metastasis and mild Pallor metabolic lymphadenopathy in porta hepatis.   09/06/2014 - 02/01/2015 Chemotherapy mFOLFOX, stopped due to his neuropathy, and earlier mild disease progression on PET    09/07/2014 - 09/11/2014 Hospital Admission He was admitted for fever and GI bleeding. Received 3 units of RBC.   10/26/2014 Imaging Interval significant improvement in the primary small bowel mass, adjacent lymphadenopathy and extensive hepatic metastatic disease. No other new lesions.    02/09/2015 Imaging PET/CT scan showed stable primary malignancy in the proximal jejunum, mild metabolic progression of the 3 residual metabolic liver metastasis. CT  Portion showed decreased size of his liver metastasis.   02/27/2015 -  Chemotherapy second line chemo FOLFIRI, every 2 weeks, and panitumumab (held for second cycle due to severe skin rashes)       HISTORY OF PRESENTING ILLNESS:  Tyler Evans 70 y.o. male is here because of a recent diagnosis of small bowel cancer. He presented to his PCP on 08/17/2014 with complaints of feeling weak and a one month history of a cough. He was found to be anemic and was referred to the emergency department. Hemoglobin was found to be 7.6, MCV 76.6; creatinine was elevated at 1.74. Stool was Hemoccult positive.  CT abdomen/pelvis without contrast on 08/17/2014 showed multiple large masses within the liver, bulky in appearance. The largest measured approximately 5.7 cm. There appeared to be a focal filling defect within the proximal jejunum measuring approximately 2.5 x 1.7 cm. There was mild adjacent jejunal wall thickening. The lung bases were clear.  On 08/18/2014 he underwent a small bowel enteroscopy with biopsy by Dr. Benson Norway. The esophagus and gastric lumen were normal. At the proximal jejunum there was evidence of abnormal mucosa and friability. The pediatric colonoscope was not able to traverse the area. Biopsies were obtained. An ultraslim colonoscope was then utilized. This colonoscope was able to traverse the area of stenosis which measured approximately 3 cm in length. 50% of the lumen was ulcerated. 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 MMR stains are pending.  He was discharged home on 08/19/2014.  CURRENT THERAPY: FOLFIRI with 5FU '2400mg'$ /m2 (decresed to '2000mg'$ /m2 from cycle 4) over 46 hours and irinotecan '180mg'$ /m2 (decreased to '165mg'$ /m2 from cycle 4), every 2 weeks.   INTERIM HISTORY:  Mr. Handley returns for follow-up. He is accompanied by his wife to the clinic today. He is doing well overall. He has been tolerating chemotherapy well lately,  no significant side effects. He does feel fatigued and sleepy after the Neulasta injection for a few days, no bone pain or other complaints. He has good appetite and eating well. His weight being stable. His  bowel movement is normal, no signs of GI bleeding. His neuropathy is stable, he is able to button and write.   MEDICAL HISTORY:  Past Medical History  Diagnosis Date  . Diabetes mellitus without complication ([place])   . Hypertension   . Small bowel cancer ([place]) 08/18/2014    SURGICAL HISTORY: Past Surgical History  Procedure Laterality Date  . Enteroscopy N/A 08/18/2014    Procedure: ENTEROSCOPY;  Surgeon: Carol Ada, MD;  Location: [facility];  Service: Endoscopy;  Laterality: N/A;    SOCIAL HISTORY: History   Social History  . Marital Status: Married    Spouse Name: N/A  . Number of Children: 2  . Years of Education: N/A   Occupational History  .  retired Ambulance person    Social History Main Topics  . Smoking status: Never Smoker   . Smokeless tobacco: Not on file  . Alcohol Use: No  . Drug Use: Not on file  . Sexual Activity: Not on file   Other Topics Concern  . Not on file   Social History Narrative  He lives in [place]. He is married. He has 2 children both reported to be in good health. No tobacco or alcohol use. He is a retired Ambulance person.  FAMILY HISTORY: Family History  Problem Relation Age of Onset  . Heart failure, Alzheimer's  Mother   . Lung cancer Father   . Kidney cancer Brother     ALLERGIES:  is allergic to penicillins.  MEDICATIONS:  Current Outpatient Prescriptions on File Prior to Visit  Medication Sig Dispense Refill  . atorvastatin (LIPITOR) 20 MG tablet Take 20 mg by mouth daily.    . Blood Glucose Monitoring Suppl (ONE TOUCH ULTRA MINI) W/DEVICE KIT     . clindamycin (CLINDAGEL) 1 % gel Apply topically 2 (two) times daily. 30 g 2  . gabapentin (NEURONTIN) 100 MG capsule Take 2 capsules (200 mg total) by mouth 3 (three) times daily. 150 capsule 2  . HUMALOG 100 UNIT/ML injection     . LANTUS SOLOSTAR 100 UNIT/ML Solostar Pen     . lidocaine-prilocaine (EMLA) cream APPLY TO AFFECTED AREA ONCE  3  . lisinopril  (PRINIVIL,ZESTRIL) 20 MG tablet Take 20 mg by mouth daily.    . ONE TOUCH ULTRA TEST test strip     . potassium chloride SA (K-DUR,KLOR-CON) 20 MEQ tablet Take 1 tablet (20 mEq total) by mouth daily. Take 20 mEq by mouth daily for  10  Days. 30 tablet 2  . PRESCRIPTION MEDICATION See admin instructions. Receives Vectibix, Leucovorin, and Adrucil chemotherapy every 2 weeks.    . tamsulosin (FLOMAX) 0.4 MG CAPS capsule Take 0.4 mg by mouth daily.  12  . hydrocortisone 2.5 % cream Apply topically 2 (two) times daily. (Patient not taking: Reported on 06/12/2015) 30 g 2   No current facility-administered medications on file prior to visit.  ;  REVIEW OF SYSTEMS:   Constitutional: Denies fevers, chills or abnormal night sweats; mild fatigue, weight is stable.  Eyes: Denies blurriness of vision, double vision or watery eyes Ears, nose, mouth, throat, and face: Denies mucositis or sore throat Respiratory: One month history of a nonproductive cough;  no shortness of breath Cardiovascular: Denies palpitation, chest discomfort or lower extremity swelling Gastrointestinal:  Denies nausea, heartburn. No dysphagia. He has noted less frequent bowel movements over the past 2 weeks. Does not characterize this as constipation. Skin: Denies abnormal skin rashes Lymphatics: Denies new lymphadenopathy or easy bruising Neurological: Denies numbness, tingling. No extremity weakness Behavioral/Psych: Mood is stable, no new changes  All other systems were reviewed with the patient and are negative.  PHYSICAL EXAMINATION: ECOG PERFORMANCE STATUS: 1  Filed Vitals:   07/09/15 1423  BP: 131/55  Pulse: 78  Temp: 98.6 F (37 C)  Resp: 18   Filed Weights   07/09/15 1423  Weight: 199 lb 8 oz (90.493 kg)    GENERAL:alert, no distress and comfortable SKIN: skin color, texture, turgor are normal, (+) skin pigmentation from his rash on his face, neck, chest and upper abdomen.  EYES: normal, conjunctiva are pink  and non-injected, sclera clear OROPHARYNX:no exudate, no erythema and lips, buccal mucosa, and tongue normal  NECK: supple, thyroid normal size, non-tender, without nodularity LYMPH:  no palpable lymphadenopathy in the cervical, axillary or inguinal regions LUNGS: clear to auscultation and percussion with normal breathing effort HEART: regular rate & rhythm and no murmurs and no lower extremity edema ABDOMEN:abdomen soft, mild tenderness at the right upper quadrant , no rebound pain, normal bowel sounds Musculoskeletal:no cyanosis of digits and no clubbing  PSYCH: alert & oriented x 3 with fluent speech NEURO: no focal motor deficits, his light touch sensation and vibration sensation are diminished on his hands and feet.   LABORATORY DATA:  I have reviewed the data as listed CBC Latest Ref Rng 07/09/2015 06/25/2015 06/12/2015  WBC 4.0 - 10.3 10e3/uL 5.3 9.7 8.8  Hemoglobin 13.0 - 17.1 g/dL 7.8(L) 8.8(L) 9.0(L)  Hematocrit 38.4 - 49.9 % 24.7(L) 28.0(L) 28.9(L)  Platelets 140 - 400 10e3/uL 131(L) 151 124(L)    CMP Latest Ref Rng 07/09/2015 06/25/2015 06/12/2015  Glucose 70 - 140 mg/dl 210(H) 90 87  BUN 7.0 - 26.0 mg/dL 11.9 11.3 11.9  Creatinine 0.7 - 1.3 mg/dL 1.2 1.1 1.2  Sodium 136 - 145 mEq/L 144 142 145  Potassium 3.5 - 5.1 mEq/L 3.5 3.8 3.6  Chloride 96 - 112 mmol/L - - -  CO2 22 - 29 mEq/L '26 26 25  '$ Calcium 8.4 - 10.4 mg/dL 8.7 8.5 8.7  Total Protein 6.4 - 8.3 g/dL 6.0(L) 6.3(L) 6.1(L)  Total Bilirubin 0.20 - 1.20 mg/dL <0.30 0.33 <0.30  Alkaline Phos 40 - 150 U/L 106 119 106  AST 5 - 34 U/L '23 26 26  '$ ALT 0 - 55 U/L '19 20 22     '$ Pathology report:  Small Intestine Biopsy, jejunal mass 08/18/2014 - INVASIVE ADENOCARCINOMA IN A BACKGROUND OF TUBULOVILLOUS ADENOMA. ADDITIONAL INFORMATION: Mismatch Repair (MMR) Protein Immunohistochemistry (IHC) IHC Expression Result (LIMITED TUMOR): MLH1: Preserved nuclear expression (greater 50% tumor expression) MSH2: Preserved nuclear  expression (greater 50% tumor expression) MSH6: Preserved nuclear expression (greater 50% tumor expression) PMS2: Preserved nuclear expression (greater 50% tumor expression) * Internal control demonstrates intact nuclear expression Interpretation: NORMAL There is preserved expression  Diagnosis 08/28/2014  Liver, needle/core biopsy, right lobe - METASTATIC ADENOCARCINOMA   RADIOGRAPHIC STUDIES: I have personally reviewed the radiological images as listed and agreed with the findings in the report.   CT chest, abdomen and pelvis with contrast 05/14/2015 IMPRESSION: 1. Interval improvement in the appearance of multi focal liver metastasis. 2. Several small nonspecific pulmonary nodules are again noted. There is a nodule in the left lower lobe which is minimally increased in size measuring 5  mm, versus 3 mm previously. Attention on follow-up exam recommended.   ASSESSMENT & PLAN:  70 year old gentleman with past medical history of diabetes, hypertension, who presents with anemia and weakness.  1.Small bowel cancer metastatic to the liver and abdominal nodes, MMR normal -I previously reviewed his imaging findings, jejunum biopsy and liver biopsy results with patient and his wife extensively.  -Unfortunately he has stage IV disease with diffuse liver metastasis, this is an incurable disease, with overall very poor prognosis. -Is currently on second line FOLFIRI now, tolerating well, dose decreased from cycle 4 due to neutropenia. -I reviewed his staging CT scan results with patient and his wife, he has had a good partial response in the liver metastasis, no other new lesions. He is clinically doing well -his CEA Has been slowly trending up, but overall level is still low, we'll continue monitoring. -Repeat restaging PET scan in 4 weeks  -I discussed the role of genomic testing, such as Foundation one, to see if his tumor contains any actionable mutations. The cost and reimbursement  issue was discussed with him, he understands there is a possibility high out-of-pocket cost for this test if it's not covered by his insurance. He agrees to proceed, I will request today   2. Microcytic anemia secondary to GI bleeding, iron deficiency and chemotherapy -His serum iron level and saturation were low, although ferritin is normal, he has some degree of iron deficient anemia, and anemia of malignancy  -Consider blood transfusion if hemoglobin less than 8 or symptomatic anemia -he received IV feraheme 510 mg twice in 09/2014, repeated iron study was normal in 05/2015  -Slightly worse today, hemoglobin 7.8. He is not symptomatic, we'll continue monitoring.  3.  Hypertension and DM  -He will continue follow-up with his primary care physician. -We discussed that we will monitor his blood pressure and blood sugar closely and may need to adjust his medication during the chemotherapy   4. Peripheral neuropathy, secondary to chemotherapy, G1 -Overall improved. Stable lately. -Continue Neurontin -We'll continue observation.  Plan: -lab reviewed, adequate for treatment, Cycle 8 FOLFIRI tomorrow and continue every 2 weeks, Neulasta on day 3 -Foundation one test from his previous biopsy -repeat iron study in 2 weeks, consider blood transfusion if hemoglobin less than 7.5 or symptomatic anemia -I will see him back in 2 weeks, will order CT scan on his next visit   Tyler Evans  07/09/2015

## 2015-07-10 ENCOUNTER — Ambulatory Visit (HOSPITAL_BASED_OUTPATIENT_CLINIC_OR_DEPARTMENT_OTHER): Payer: Medicare Other

## 2015-07-10 VITALS — BP 114/50 | HR 68 | Temp 97.9°F | Resp 18

## 2015-07-10 DIAGNOSIS — Z5111 Encounter for antineoplastic chemotherapy: Secondary | ICD-10-CM | POA: Diagnosis not present

## 2015-07-10 DIAGNOSIS — C179 Malignant neoplasm of small intestine, unspecified: Secondary | ICD-10-CM

## 2015-07-10 LAB — CEA: CEA1: 41 ng/mL — AB (ref 0.0–4.7)

## 2015-07-10 LAB — CEA (PARALLEL TESTING): CEA: 25 ng/mL

## 2015-07-10 MED ORDER — ATROPINE SULFATE 1 MG/ML IJ SOLN
INTRAMUSCULAR | Status: AC
  Filled 2015-07-10: qty 1

## 2015-07-10 MED ORDER — SODIUM CHLORIDE 0.9 % IV SOLN
Freq: Once | INTRAVENOUS | Status: AC
  Administered 2015-07-10: 08:00:00 via INTRAVENOUS

## 2015-07-10 MED ORDER — ATROPINE SULFATE 1 MG/ML IJ SOLN
0.5000 mg | Freq: Once | INTRAMUSCULAR | Status: AC | PRN
  Administered 2015-07-10: 0.5 mg via INTRAVENOUS

## 2015-07-10 MED ORDER — SODIUM CHLORIDE 0.9 % IV SOLN
165.0000 mg/m2 | Freq: Once | INTRAVENOUS | Status: AC
  Administered 2015-07-10: 350 mg via INTRAVENOUS
  Filled 2015-07-10: qty 5.83

## 2015-07-10 MED ORDER — SODIUM CHLORIDE 0.9 % IV SOLN
Freq: Once | INTRAVENOUS | Status: AC
  Administered 2015-07-10: 08:00:00 via INTRAVENOUS
  Filled 2015-07-10: qty 8

## 2015-07-10 MED ORDER — SODIUM CHLORIDE 0.9 % IV SOLN
2000.0000 mg/m2 | INTRAVENOUS | Status: DC
  Administered 2015-07-10: 4250 mg via INTRAVENOUS
  Filled 2015-07-10: qty 85

## 2015-07-10 MED ORDER — LEUCOVORIN CALCIUM INJECTION 100 MG
20.0000 mg/m2 | Freq: Once | INTRAMUSCULAR | Status: AC
  Administered 2015-07-10: 42 mg via INTRAVENOUS
  Filled 2015-07-10: qty 2.1

## 2015-07-10 NOTE — Progress Notes (Signed)
Per Dr. Ernestina Penna office note from 07/09/15, okay to treat today despite Hgb 7.8.

## 2015-07-10 NOTE — Patient Instructions (Signed)
[facility] Discharge Instructions for Patients Receiving Chemotherapy  Today you received the following chemotherapy agents Irinotecan/Leucovorin/Fluorouracil.  To help prevent nausea and vomiting after your treatment, we encourage you to take your nausea medication as directed.   If you develop nausea and vomiting that is not controlled by your nausea medication, call the clinic.   BELOW ARE SYMPTOMS THAT SHOULD BE REPORTED IMMEDIATELY:  *FEVER GREATER THAN 100.5 F  *CHILLS WITH OR WITHOUT FEVER  NAUSEA AND VOMITING THAT IS NOT CONTROLLED WITH YOUR NAUSEA MEDICATION  *UNUSUAL SHORTNESS OF BREATH  *UNUSUAL BRUISING OR BLEEDING  TENDERNESS IN MOUTH AND THROAT WITH OR WITHOUT PRESENCE OF ULCERS  *URINARY PROBLEMS  *BOWEL PROBLEMS  UNUSUAL RASH Items with * indicate a potential emergency and should be followed up as soon as possible.  Feel free to call the clinic you have any questions or concerns. The clinic phone number is [phone].  Please show the CHEMO ALERT CARD at check-in to the Emergency Department and triage nurse.    

## 2015-07-11 ENCOUNTER — Other Ambulatory Visit (HOSPITAL_COMMUNITY)
Admission: RE | Admit: 2015-07-11 | Discharge: 2015-07-11 | Disposition: A | Discharge status: Home / Self Care | Payer: Medicare Other | Source: Ambulatory Visit | Attending: Hematology | Admitting: Hematology

## 2015-07-11 DIAGNOSIS — C179 Malignant neoplasm of small intestine, unspecified: Secondary | ICD-10-CM | POA: Insufficient documentation

## 2015-07-11 DIAGNOSIS — C772 Secondary and unspecified malignant neoplasm of intra-abdominal lymph nodes: Secondary | ICD-10-CM | POA: Diagnosis not present

## 2015-07-11 DIAGNOSIS — C787 Secondary malignant neoplasm of liver and intrahepatic bile duct: Secondary | ICD-10-CM | POA: Diagnosis not present

## 2015-07-12 ENCOUNTER — Ambulatory Visit: Payer: Medicare Other

## 2015-07-12 ENCOUNTER — Ambulatory Visit (HOSPITAL_BASED_OUTPATIENT_CLINIC_OR_DEPARTMENT_OTHER): Payer: Medicare Other

## 2015-07-12 VITALS — BP 129/53 | HR 60 | Temp 98.4°F | Resp 18

## 2015-07-12 DIAGNOSIS — Z5189 Encounter for other specified aftercare: Secondary | ICD-10-CM

## 2015-07-12 DIAGNOSIS — C179 Malignant neoplasm of small intestine, unspecified: Secondary | ICD-10-CM

## 2015-07-12 MED ORDER — SODIUM CHLORIDE 0.9 % IJ SOLN
10.0000 mL | INTRAMUSCULAR | Status: DC | PRN
  Administered 2015-07-12: 10 mL
  Filled 2015-07-12: qty 10

## 2015-07-12 MED ORDER — PEGFILGRASTIM INJECTION 6 MG/0.6ML ~~LOC~~
6.0000 mg | PREFILLED_SYRINGE | Freq: Once | SUBCUTANEOUS | Status: AC
  Administered 2015-07-12: 6 mg via SUBCUTANEOUS
  Filled 2015-07-12: qty 0.6

## 2015-07-12 MED ORDER — HEPARIN SOD (PORK) LOCK FLUSH 100 UNIT/ML IV SOLN
500.0000 [IU] | Freq: Once | INTRAVENOUS | Status: AC | PRN
  Administered 2015-07-12: 500 [IU]
  Filled 2015-07-12: qty 5

## 2015-07-12 NOTE — Patient Instructions (Signed)
Pegfilgrastim injection What is this medicine? PEGFILGRASTIM (PEG fil gra stim) is a long-acting granulocyte colony-stimulating factor that stimulates the growth of neutrophils, a type of white blood cell important in the body's fight against infection. It is used to reduce the incidence of fever and infection in patients with certain types of cancer who are receiving chemotherapy that affects the bone marrow, and to increase survival after being exposed to high doses of radiation. This medicine may be used for other purposes; ask your health care provider or pharmacist if you have questions. What should I tell my health care provider before I take this medicine? They need to know if you have any of these conditions: -kidney disease -latex allergy -ongoing radiation therapy -sickle cell disease -skin reactions to acrylic adhesives (On-Body Injector only) -an unusual or allergic reaction to pegfilgrastim, filgrastim, other medicines, foods, dyes, or preservatives -pregnant or trying to get pregnant -breast-feeding How should I use this medicine? This medicine is for injection under the skin. If you get this medicine at home, you will be taught how to prepare and give the pre-filled syringe or how to use the On-body Injector. Refer to the patient Instructions for Use for detailed instructions. Use exactly as directed. Take your medicine at regular intervals. Do not take your medicine more often than directed. It is important that you put your used needles and syringes in a special sharps container. Do not put them in a trash can. If you do not have a sharps container, call your pharmacist or healthcare provider to get one. Talk to your pediatrician regarding the use of this medicine in children. While this drug may be prescribed for selected conditions, precautions do apply. Overdosage: If you think you have taken too much of this medicine contact a poison control center or emergency room at  once. NOTE: This medicine is only for you. Do not share this medicine with others. What if I miss a dose? It is important not to miss your dose. Call your doctor or health care professional if you miss your dose. If you miss a dose due to an On-body Injector failure or leakage, a new dose should be administered as soon as possible using a single prefilled syringe for manual use. What may interact with this medicine? Interactions have not been studied. Give your health care provider a list of all the medicines, herbs, non-prescription drugs, or dietary supplements you use. Also tell them if you smoke, drink alcohol, or use illegal drugs. Some items may interact with your medicine. This list may not describe all possible interactions. Give your health care provider a list of all the medicines, herbs, non-prescription drugs, or dietary supplements you use. Also tell them if you smoke, drink alcohol, or use illegal drugs. Some items may interact with your medicine. What should I watch for while using this medicine? You may need blood work done while you are taking this medicine. If you are going to need a MRI, CT scan, or other procedure, tell your doctor that you are using this medicine (On-Body Injector only). What side effects may I notice from receiving this medicine? Side effects that you should report to your doctor or health care professional as soon as possible: -allergic reactions like skin rash, itching or hives, swelling of the face, lips, or tongue -dizziness -fever -pain, redness, or irritation at site where injected -pinpoint red spots on the skin -red or dark-brown urine -shortness of breath or breathing problems -stomach or side pain, or pain   at the shoulder -swelling -tiredness -trouble passing urine or change in the amount of urine Side effects that usually do not require medical attention (report to your doctor or health care professional if they continue or are  bothersome): -bone pain -muscle pain This list may not describe all possible side effects. Call your doctor for medical advice about side effects. You may report side effects to FDA at 1-800-FDA-1088. Where should I keep my medicine? Keep out of the reach of children. Store pre-filled syringes in a refrigerator between 2 and 8 degrees C (36 and 46 degrees F). Do not freeze. Keep in carton to protect from light. Throw away this medicine if it is left out of the refrigerator for more than 48 hours. Throw away any unused medicine after the expiration date. NOTE: This sheet is a summary. It may not cover all possible information. If you have questions about this medicine, talk to your doctor, pharmacist, or health care provider.    2016, Elsevier/Gold Standard. (2014-06-08 14:30:14)  

## 2015-07-12 NOTE — Progress Notes (Signed)
Neulasta injection given by infusion nurse after home chemo pump disconnected

## 2015-07-17 IMAGING — PT NM PET TUM IMG RESTAG (PS) SKULL BASE T - THIGH
8 series · 23 of 25 positions shown · non-contrast
Comparison: PET-CT 09/05/2014

CLINICAL DATA: Subsequent treatment strategy for small-bowel
cancer.

EXAM:
NUCLEAR MEDICINE PET SKULL BASE TO THIGH
TECHNIQUE: 9.91 mCi F-18 FDG was injected intravenously. Full-ring PET imaging
was performed from the skull base to thigh after the radiotracer. CT
data was obtained and used for attenuation correction and anatomic
localization.
FASTING BLOOD GLUCOSE:  Value: 194 mg/dl

[Series 3: pet sk_thigh ac · axial · 5.0mm · 4.07mm/px · z∈[-923,+5]mm · 5 of 233 slices shown]
[im 1/233]
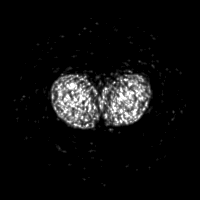
[im 59/233]
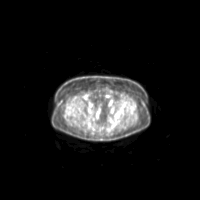
[im 117/233]
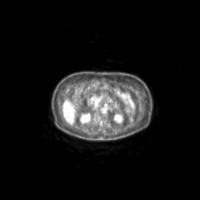
[im 175/233]
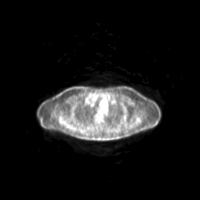
[im 233/233]
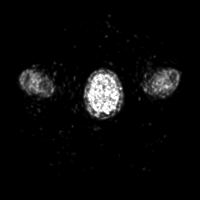

[Series 4: ct sk_thigh 5.0 hd_fov · axial · 5.0mm · 1.12mm/px · z∈[-923,+5]mm · 4 of 232 slices shown]
[im 1/232]
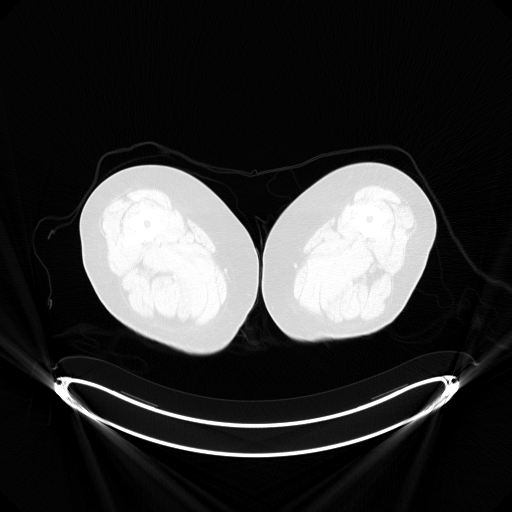
[im 116/232]
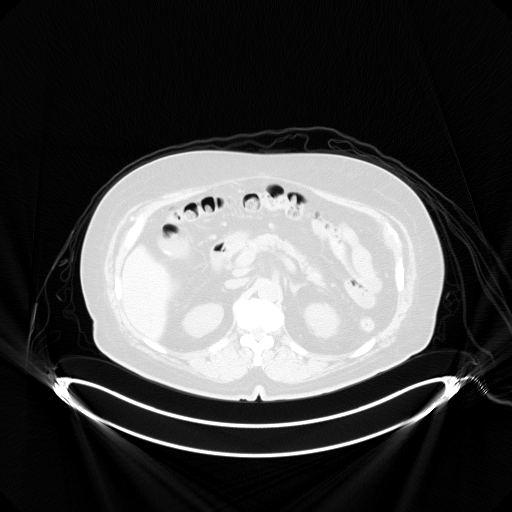
[im 174/232]
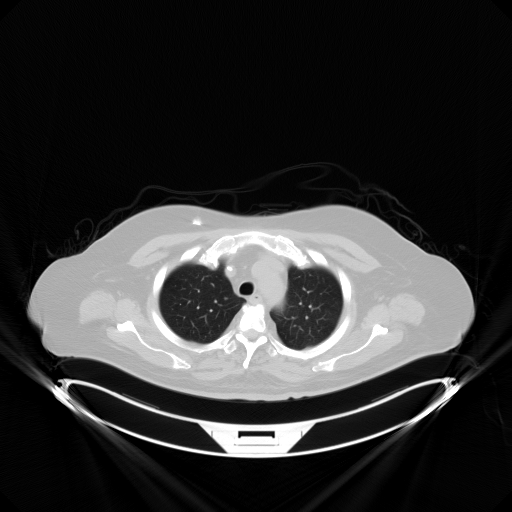
[im 232/232  brain]
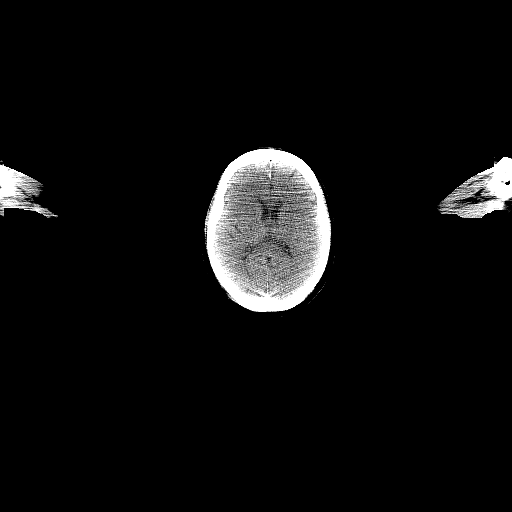

[Series 7: ct sk_thigh 5.0 b70f (id)_bone · axial · 5.0mm · 0.67mm/px · 1 of 51 slices shown]
[im 1/51  bone]
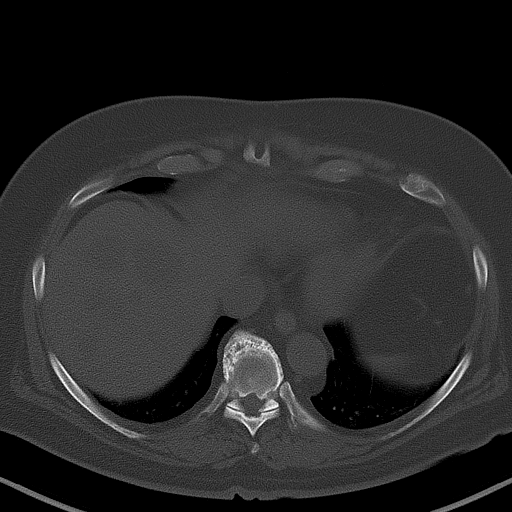

[Series 8: pet sk_thigh nac · axial · 5.0mm · 4.07mm/px · z∈[-923,+5]mm · 6 of 233 slices shown]
[im 1/233]
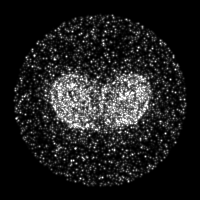
[im 47/233]
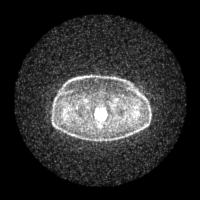
[im 93/233]
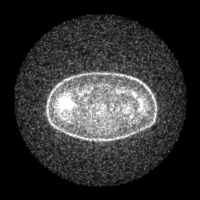
[im 140/233]
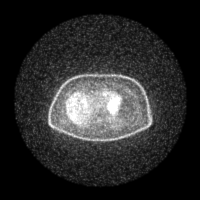
[im 186/233]
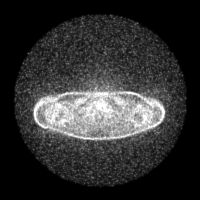
[im 233/233]
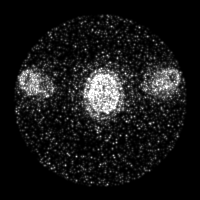

[Series 604: mip collection<mip range> · coronal · 1.93mm/px · 1 of 32 slices shown]
[im 1/32]
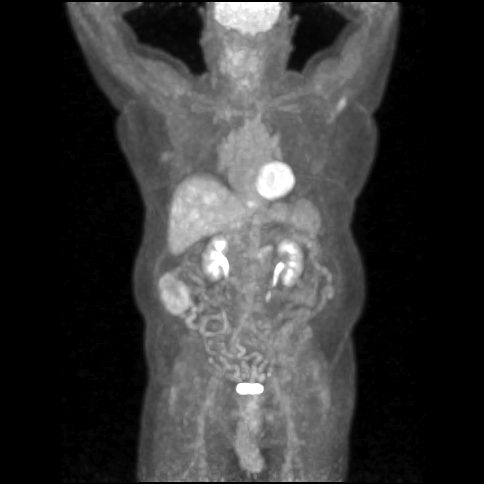

[Series 606: range-ct sk_thigh 5.0 hd_fov-tra-<alpha range> · 4 of 211 slices shown]
[im 53/211]
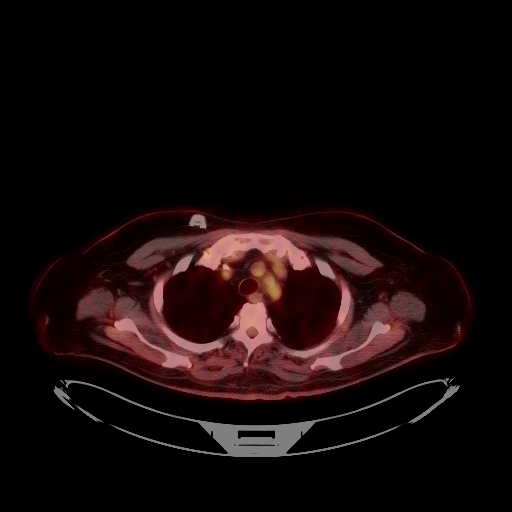
[im 106/211]
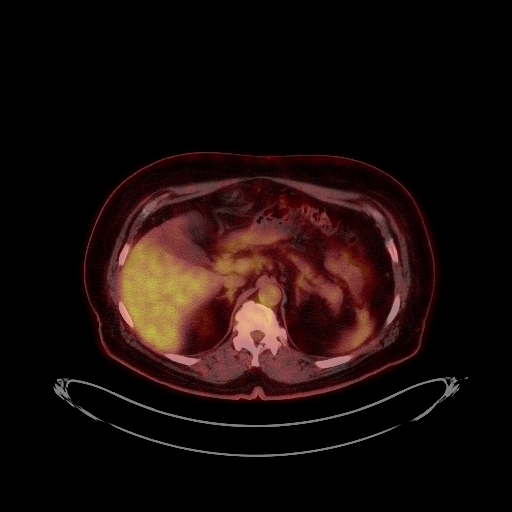
[im 158/211]
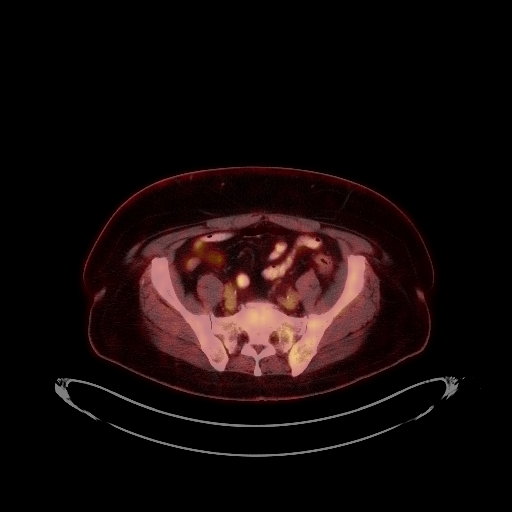
[im 211/211]
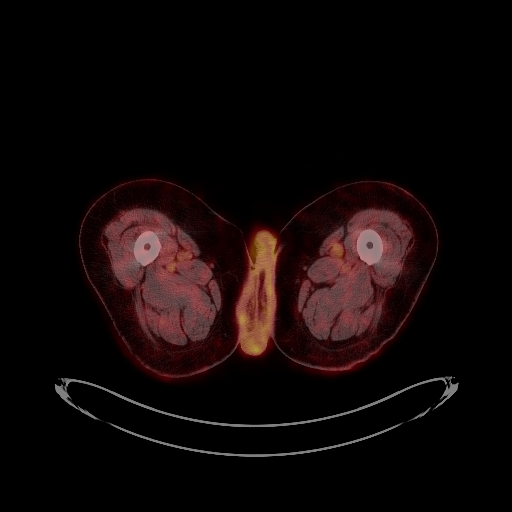

[Series 1094: results mm oncology reading · 1.02mm/px · 1 of 3 slices shown (1 of 2)]
[im 1/3]
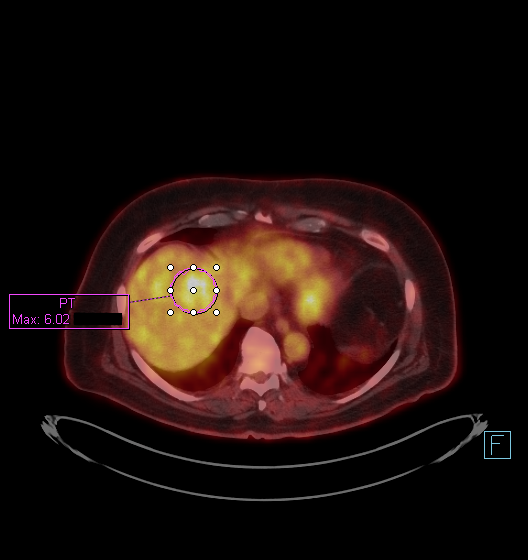

[Series 1217: results mm oncology reading · 0.49mm/px · 1 of 1 slices shown (2 of 2)]
[im 1/1]
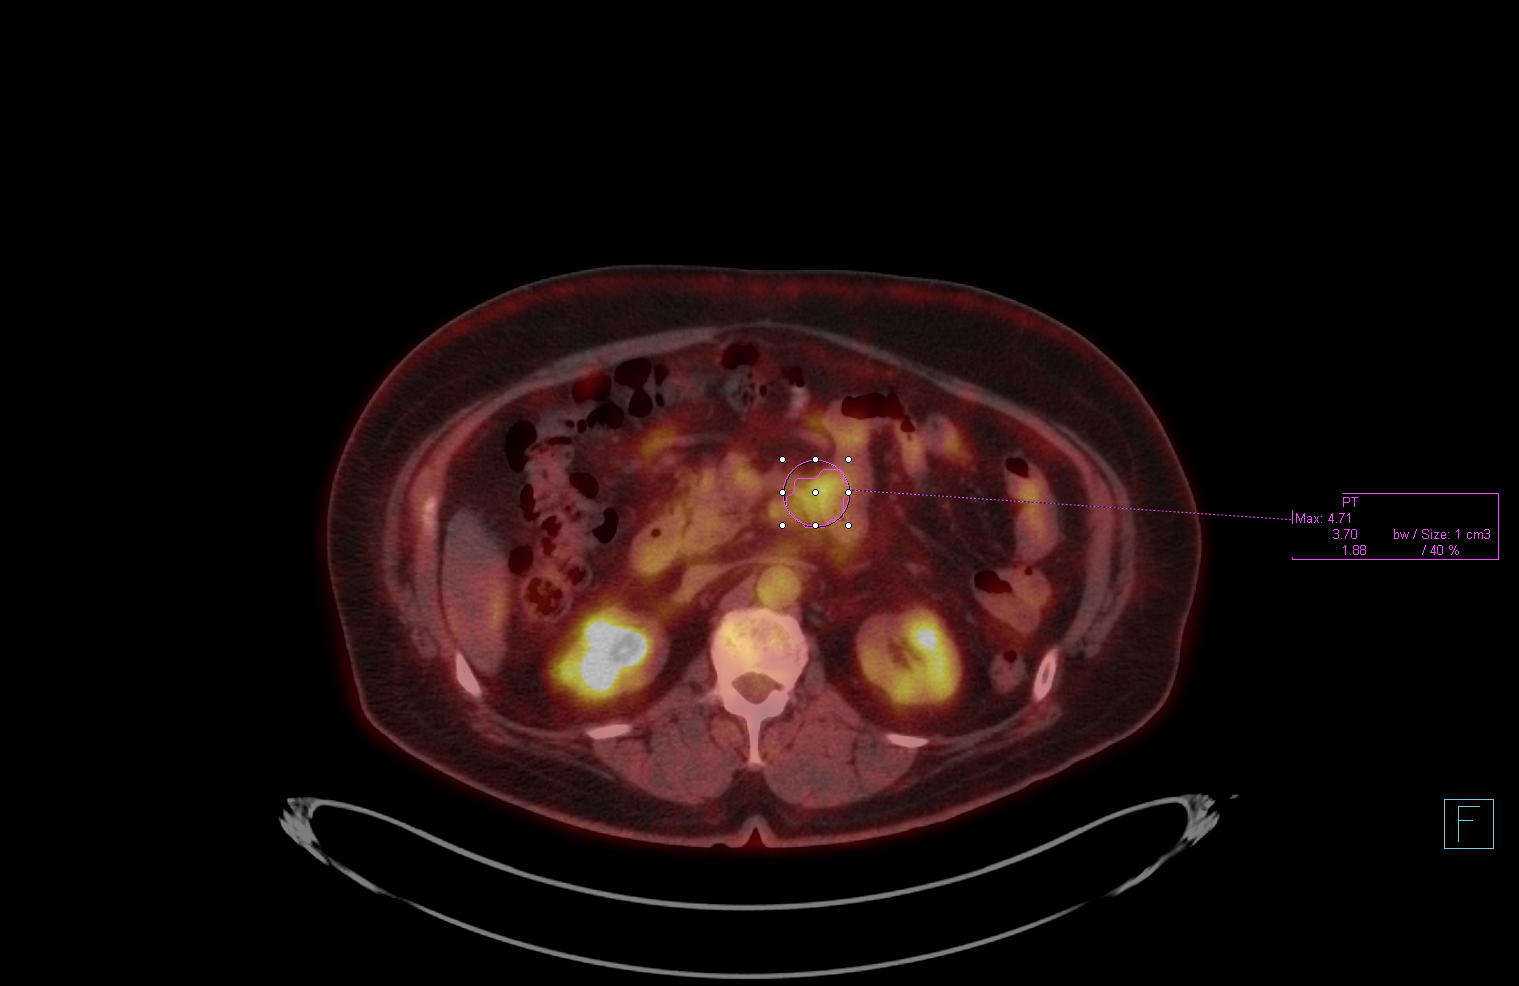

[23 of 25 positions shown; findings below may reference images not displayed]

FINDINGS: NECK

No hypermetabolic cervical lymph nodes are identified.There are no
lesions of the pharyngeal mucosal space. Right maxillary sinus
mucous retention cyst noted.

CHEST

There are no hypermetabolic mediastinal, hilar or axillary lymph
nodes. There is no suspicious pulmonary activity. There are stable
scattered small pulmonary nodules, seen in the right middle lobe on
image 41, in the left upper lobe on image 25 and in the left lower
lobe on image 31.

ABDOMEN/PELVIS

There has been interval significant improvement in the previously
demonstrated multifocal hypermetabolic hepatic metastatic disease.
The greatest area of remaining hyper metabolism is in the lateral
segment of the left lobe, corresponding with a 2.8 cm lesion on
image 102. This has an SUV max of 6.3 (previously 13.3). No discrete
residual enlarged or hypermetabolic lymph nodes are identified
within the porta hepatis. The central mesenteric/ small-bowel mass
is not well-defined, having significantly decreased in size (image
127). The hyper metabolism in this area has significantly improved
as well, now 4.7 (previously 19.7). No abnormal activity is seen
within the spleen, pancreas or adrenal glands. Small gallstones,
mild atherosclerosis and prostate gland enlargement noted.

SKELETON

There is no hypermetabolic activity to suggest osseous metastatic
disease.
IMPRESSION: 1. Interval significant improvement in the primary small bowel mass,
adjacent lymphadenopathy and extensive hepatic metastatic disease.
2. No disease progression identified.
3. No significant findings in the neck or chest. Small pulmonary
nodules bilaterally are unchanged.

## 2015-07-22 ENCOUNTER — Other Ambulatory Visit: Payer: Self-pay | Admitting: Hematology

## 2015-07-23 ENCOUNTER — Ambulatory Visit: Payer: Medicare Other

## 2015-07-23 ENCOUNTER — Ambulatory Visit (HOSPITAL_BASED_OUTPATIENT_CLINIC_OR_DEPARTMENT_OTHER): Payer: Medicare Other | Admitting: Hematology

## 2015-07-23 ENCOUNTER — Encounter: Payer: Self-pay | Admitting: Hematology

## 2015-07-23 ENCOUNTER — Other Ambulatory Visit (HOSPITAL_BASED_OUTPATIENT_CLINIC_OR_DEPARTMENT_OTHER): Payer: Medicare Other

## 2015-07-23 VITALS — BP 117/39 | HR 63 | Temp 97.8°F | Resp 18 | Ht 66.0 in | Wt 198.2 lb

## 2015-07-23 DIAGNOSIS — G622 Polyneuropathy due to other toxic agents: Secondary | ICD-10-CM

## 2015-07-23 DIAGNOSIS — C179 Malignant neoplasm of small intestine, unspecified: Secondary | ICD-10-CM | POA: Diagnosis not present

## 2015-07-23 DIAGNOSIS — D509 Iron deficiency anemia, unspecified: Secondary | ICD-10-CM

## 2015-07-23 DIAGNOSIS — C787 Secondary malignant neoplasm of liver and intrahepatic bile duct: Secondary | ICD-10-CM

## 2015-07-23 DIAGNOSIS — C772 Secondary and unspecified malignant neoplasm of intra-abdominal lymph nodes: Secondary | ICD-10-CM

## 2015-07-23 DIAGNOSIS — D500 Iron deficiency anemia secondary to blood loss (chronic): Secondary | ICD-10-CM

## 2015-07-23 DIAGNOSIS — D630 Anemia in neoplastic disease: Secondary | ICD-10-CM

## 2015-07-23 DIAGNOSIS — I10 Essential (primary) hypertension: Secondary | ICD-10-CM

## 2015-07-23 DIAGNOSIS — D6481 Anemia due to antineoplastic chemotherapy: Secondary | ICD-10-CM

## 2015-07-23 DIAGNOSIS — E119 Type 2 diabetes mellitus without complications: Secondary | ICD-10-CM

## 2015-07-23 LAB — CBC WITH DIFFERENTIAL/PLATELET
BASO%: 0.3 % (ref 0.0–2.0)
BASOS ABS: 0 10*3/uL (ref 0.0–0.1)
EOS ABS: 0 10*3/uL (ref 0.0–0.5)
EOS%: 0.2 % (ref 0.0–7.0)
HEMATOCRIT: 26.6 % — AB (ref 38.4–49.9)
HEMOGLOBIN: 8.2 g/dL — AB (ref 13.0–17.1)
LYMPH#: 1.1 10*3/uL (ref 0.9–3.3)
LYMPH%: 10.3 % — ABNORMAL LOW (ref 14.0–49.0)
MCH: 28.7 pg (ref 27.2–33.4)
MCHC: 30.9 g/dL — ABNORMAL LOW (ref 32.0–36.0)
MCV: 93.1 fL (ref 79.3–98.0)
MONO#: 1.4 10*3/uL — AB (ref 0.1–0.9)
MONO%: 12.7 % (ref 0.0–14.0)
NEUT#: 8.3 10*3/uL — ABNORMAL HIGH (ref 1.5–6.5)
NEUT%: 76.5 % — AB (ref 39.0–75.0)
PLATELETS: 134 10*3/uL — AB (ref 140–400)
RBC: 2.86 10*6/uL — ABNORMAL LOW (ref 4.20–5.82)
RDW: 20.6 % — AB (ref 11.0–14.6)
WBC: 10.8 10*3/uL — ABNORMAL HIGH (ref 4.0–10.3)

## 2015-07-23 LAB — COMPREHENSIVE METABOLIC PANEL
ALBUMIN: 3.1 g/dL — AB (ref 3.5–5.0)
ALK PHOS: 125 U/L (ref 40–150)
ALT: 22 U/L (ref 0–55)
ANION GAP: 10 meq/L (ref 3–11)
AST: 28 U/L (ref 5–34)
BUN: 9.2 mg/dL (ref 7.0–26.0)
CALCIUM: 8.7 mg/dL (ref 8.4–10.4)
CO2: 24 mEq/L (ref 22–29)
Chloride: 109 mEq/L (ref 98–109)
Creatinine: 1 mg/dL (ref 0.7–1.3)
EGFR: 77 mL/min/{1.73_m2} — AB (ref >90)
Glucose: 64 mg/dl — ABNORMAL LOW (ref 70–140)
POTASSIUM: 4.4 meq/L (ref 3.5–5.1)
Sodium: 143 mEq/L (ref 136–145)
Total Bilirubin: <0.3 mg/dL (ref 0.20–1.20)
Total Protein: 6.3 g/dL — ABNORMAL LOW (ref 6.4–8.3)

## 2015-07-23 LAB — IRON AND TIBC
%SAT: 22 % (ref 20–55)
Iron: 59 ug/dL (ref 42–163)
TIBC: 269 ug/dL (ref 202–409)
UIBC: 209 ug/dL (ref 117–376)

## 2015-07-23 LAB — FERRITIN: FERRITIN: 451 ng/mL — AB (ref 22–316)

## 2015-07-23 NOTE — Progress Notes (Signed)
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    08/18/2014 Miscellaneous tumor KRAS mutation (-)   08/18/2014 Pathology Results Liver biopsy showed metastatic adenocarcinoma, consistent with GI primary   08/18/2014 Initial Diagnosis Small bowel cancer   08/18/2014 Procedure small bowel enteroscopy with biopsy by Dr. Benson Norway showed at the proximal jejunum there was evidence of abnormal mucosa and friability, biopsy was obtained.    09/05/2014 Imaging PET hypermetabolic mass involving a small bowel loop with adjacent mesenteric lymphadenopathy, and diffuse liver metastasis and mild Pallor metabolic lymphadenopathy in porta hepatis.   09/06/2014 - 02/01/2015 Chemotherapy mFOLFOX, stopped due to his neuropathy, and earlier mild disease progression on PET    09/07/2014 - 09/11/2014 Hospital Admission He was admitted for fever and GI bleeding. Received 3 units of RBC.   10/26/2014 Imaging Interval significant improvement in the primary small bowel mass, adjacent lymphadenopathy and extensive hepatic metastatic disease. No other new lesions.    02/09/2015 Imaging PET/CT scan showed stable primary malignancy in the proximal jejunum, mild metabolic progression of the 3 residual metabolic liver metastasis. CT  Portion showed decreased size of his liver metastasis.   02/27/2015 -  Chemotherapy second line chemo FOLFIRI, every 2 weeks, and panitumumab (held for second cycle due to severe skin rashes)       HISTORY OF PRESENTING ILLNESS:  Tyler Evans 70 y.o. male is here because of a recent diagnosis of small bowel cancer. He presented to his PCP on 08/17/2014 with complaints of feeling weak and a one month history of a cough. He was found to be anemic and was referred to the emergency department. Hemoglobin was found to be 7.6, MCV 76.6; creatinine was elevated at 1.74. Stool was Hemoccult positive.  CT abdomen/pelvis without contrast on 08/17/2014 showed multiple large masses within the liver, bulky in appearance. The largest measured approximately 5.7 cm. There appeared to be a focal filling defect within the proximal jejunum measuring approximately 2.5 x 1.7 cm. There was mild adjacent jejunal wall thickening. The lung bases were clear.  On 08/18/2014 he underwent a small bowel enteroscopy with biopsy by Dr. Benson Norway. The esophagus and gastric lumen were normal. At the proximal jejunum there was evidence of abnormal mucosa and friability. The pediatric colonoscope was not able to traverse the area. Biopsies were obtained. An ultraslim colonoscope was then utilized. This colonoscope was able to traverse the area of stenosis which measured approximately 3 cm in length. 50% of the lumen was ulcerated. 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 MMR stains are pending.  He was discharged home on 08/19/2014.  CURRENT THERAPY: FOLFIRI with 5FU '2400mg'$ /m2 (decresed to '2000mg'$ /m2 from cycle 4) over 46 hours and irinotecan '180mg'$ /m2 (decreased to '165mg'$ /m2 from cycle 4), every 2 weeks.   INTERIM HISTORY:  Mr. Longest returns for follow-up. He is accompanied by his wife to the clinic today. He is doing well overall. He has been tolerating chemotherapy well lately,  no significant side effects. He does feel fatigued and sleepy after the Neulasta injection for a few days, no bone pain or other complaints. He has good appetite and eating well. His weight being stable. His  bowel movement is normal, no signs of GI bleeding. His neuropathy is increased a little,, he is able to button and write.  He is taking gabepentin for this.Marland Kitchen He is taking iron and tolerating this well.  His Hgb is 8.2 today and he is not symptomatic from this.  MEDICAL HISTORY:  Past Medical History  Diagnosis Date  . Diabetes mellitus without complication ([place])   . Hypertension   . Small bowel cancer (Tyler Evans) 08/18/2014    SURGICAL HISTORY: Past Surgical History  Procedure Laterality Date  . Enteroscopy N/A 08/18/2014    Procedure: ENTEROSCOPY;  Surgeon: Carol Ada, MD;  Location: [facility];  Service: Endoscopy;  Laterality: N/A;    SOCIAL HISTORY: History   Social History  . Marital Status: Married    Spouse Name: N/A  . Number of Children: 2  . Years of Education: N/A   Occupational History  .  retired Ambulance person    Social History Main Topics  . Smoking status: Never Smoker   . Smokeless tobacco: Not on file  . Alcohol Use: No  . Drug Use: Not on file  . Sexual Activity: Not on file   Other Topics Concern  . Not on file   Social History Narrative  He lives in [place]. He is married. He has 2 children both reported to be in good health. No tobacco or alcohol use. He is a retired Ambulance person.  FAMILY HISTORY: Family History  Problem Relation Age of Onset  . Heart failure, Alzheimer's  Mother   . Lung cancer Father   . Kidney cancer Brother     ALLERGIES:  is allergic to penicillins.  MEDICATIONS:  Current Outpatient Prescriptions on File Prior to Visit  Medication Sig Dispense Refill  . atorvastatin (LIPITOR) 20 MG tablet Take 20 mg by mouth daily.    . Blood Glucose Monitoring Suppl (ONE TOUCH ULTRA MINI) W/DEVICE KIT     . clindamycin (CLINDAGEL) 1 % gel Apply topically 2 (two) times daily. 30 g 2  . gabapentin (NEURONTIN) 100 MG capsule Take 2 capsules (200 mg total) by mouth 3 (three) times daily. 150 capsule 2  . HUMALOG 100 UNIT/ML injection      . hydrocortisone 2.5 % cream Apply topically 2 (two) times daily. (Patient not taking: Reported on 06/12/2015) 30 g 2  . LANTUS SOLOSTAR 100 UNIT/ML Solostar Pen     . lidocaine-prilocaine (EMLA) cream APPLY TO AFFECTED AREA ONCE  3  . lisinopril (PRINIVIL,ZESTRIL) 20 MG tablet Take 20 mg by mouth daily.    . ONE TOUCH ULTRA TEST test strip     . potassium chloride SA (K-DUR,KLOR-CON) 20 MEQ tablet Take 1 tablet (20 mEq total) by mouth daily. Take 20 mEq by mouth daily for  10  Days. 30 tablet 2  . PRESCRIPTION MEDICATION See admin instructions. Receives Vectibix, Leucovorin, and Adrucil chemotherapy every 2 weeks.    . tamsulosin (FLOMAX) 0.4 MG CAPS capsule Take 0.4 mg by mouth daily.  12   No current facility-administered medications on file prior to visit.  ;  REVIEW OF SYSTEMS:   Constitutional: Denies fevers, chills or abnormal night sweats; mild fatigue, weight is stable.  Eyes: Denies blurriness of vision, double vision or watery eyes Ears, nose, mouth, throat, and face:  Denies mucositis or sore throat Respiratory: One month history of a nonproductive cough;  no shortness of breath Cardiovascular: Denies palpitation, chest discomfort or lower extremity swelling Gastrointestinal:  Denies nausea, heartburn. No dysphagia. He has noted less frequent bowel movements over the past 2 weeks. Does not characterize this as constipation. Skin: Denies abnormal skin rashes Lymphatics: Denies new lymphadenopathy or easy bruising Neurological: Denies numbness, tingling. No extremity weakness Behavioral/Psych: Mood is stable, no new changes  All other systems were reviewed with the patient and are negative.  PHYSICAL EXAMINATION: ECOG PERFORMANCE STATUS: 1  There were no vitals filed for this visit. There were no vitals filed for this visit.  GENERAL:alert, no distress and comfortable SKIN: skin color, texture, turgor are normal, (+) skin pigmentation from his rash on his face, neck,  chest and upper abdomen.  EYES: normal, conjunctiva are pink and non-injected, sclera clear OROPHARYNX:no exudate, no erythema and lips, buccal mucosa, and tongue normal  NECK: supple, thyroid normal size, non-tender, without nodularity LYMPH:  no palpable lymphadenopathy in the cervical, axillary or inguinal regions LUNGS: clear to auscultation and percussion with normal breathing effort HEART: regular rate & rhythm and no murmurs and no lower extremity edema ABDOMEN:abdomen soft, mild tenderness at the right upper quadrant , no rebound pain, normal bowel sounds Musculoskeletal:no cyanosis of digits and no clubbing  PSYCH: alert & oriented x 3 with fluent speech NEURO: no focal motor deficits, his light touch sensation and vibration sensation are diminished on his hands and feet.   LABORATORY DATA:  I have reviewed the data as listed CBC Latest Ref Rng 07/09/2015 06/25/2015 06/12/2015  WBC 4.0 - 10.3 10e3/uL 5.3 9.7 8.8  Hemoglobin 13.0 - 17.1 g/dL 7.8(L) 8.8(L) 9.0(L)  Hematocrit 38.4 - 49.9 % 24.7(L) 28.0(L) 28.9(L)  Platelets 140 - 400 10e3/uL 131(L) 151 124(L)    CMP Latest Ref Rng 07/09/2015 06/25/2015 06/12/2015  Glucose 70 - 140 mg/dl 210(H) 90 87  BUN 7.0 - 26.0 mg/dL 11.9 11.3 11.9  Creatinine 0.7 - 1.3 mg/dL 1.2 1.1 1.2  Sodium 136 - 145 mEq/L 144 142 145  Potassium 3.5 - 5.1 mEq/L 3.5 3.8 3.6  CO2 22 - 29 mEq/L '26 26 25  '$ Calcium 8.4 - 10.4 mg/dL 8.7 8.5 8.7  Total Protein 6.4 - 8.3 g/dL 6.0(L) 6.3(L) 6.1(L)  Total Bilirubin 0.20 - 1.20 mg/dL <0.30 0.33 <0.30  Alkaline Phos 40 - 150 U/L 106 119 106  AST 5 - 34 U/L '23 26 26  '$ ALT 0 - 55 U/L '19 20 22     '$ Pathology report:  Small Intestine Biopsy, jejunal mass 08/18/2014 - INVASIVE ADENOCARCINOMA IN A BACKGROUND OF TUBULOVILLOUS ADENOMA. ADDITIONAL INFORMATION: Mismatch Repair (MMR) Protein Immunohistochemistry (IHC) IHC Expression Result (LIMITED TUMOR): MLH1: Preserved nuclear expression (greater 50% tumor  expression) MSH2: Preserved nuclear expression (greater 50% tumor expression) MSH6: Preserved nuclear expression (greater 50% tumor expression) PMS2: Preserved nuclear expression (greater 50% tumor expression) * Internal control demonstrates intact nuclear expression Interpretation: NORMAL There is preserved expression  Diagnosis 08/28/2014  Liver, needle/core biopsy, right lobe - METASTATIC ADENOCARCINOMA   RADIOGRAPHIC STUDIES: I have personally reviewed the radiological images as listed and agreed with the findings in the report.   CT chest, abdomen and pelvis with contrast 05/14/2015 IMPRESSION: 1. Interval improvement in the appearance of multi focal liver metastasis. 2. Several small nonspecific pulmonary nodules are again noted. There is a nodule in the left lower lobe which is minimally increased in size measuring 5 mm, versus 3  mm previously. Attention on follow-up exam recommended.   ASSESSMENT & PLAN:  70 year old gentleman with past medical history of diabetes, hypertension, who presents with anemia and weakness.  1.Small bowel cancer metastatic to the liver and abdominal nodes, MMR normal -I previously reviewed his imaging findings, jejunum biopsy and liver biopsy results with patient and his wife extensively.  -Unfortunately he has stage IV disease with diffuse liver metastasis, this is an incurable disease, with overall very poor prognosis. -Is currently on second line FOLFIRI now, tolerating well, dose decreased from cycle 4 due to neutropenia. -I reviewed his staging CT scan results from 05/14/2015 with patient and his wife, he has had a good partial response in the liver metastasis, no other new lesions. He is clinically doing well -his CEA Has been slowly trending up, but overall level is still low, we'll continue monitoring. -Repeat restaging CT in 2 weeks  -I discussed the role of genomic testing, such as [facility] one, to see if his tumor contains any  actionable mutations. The cost and reimbursement issue was discussed with him, he understands there is a possibility high out-of-pocket cost for this test if it's not covered by his insurance. He agrees to proceed, I requested on 07/09/2015, result is still pending  -he is tolerating chemo well, will continue   2. Microcytic anemia secondary to GI bleeding, iron deficiency and chemotherapy -His serum iron level and saturation were low, although ferritin is normal, he has some degree of iron deficient anemia, and anemia of malignancy  -Consider blood transfusion if hemoglobin less than 8 or symptomatic anemia -he received IV feraheme 510 mg twice in 09/2014, repeated iron study was normal in 05/2015 -Slightly better today,  He has restarted oral iron,  Repeated iron study result from today is still pending. I will consider blood transfusion if Hb<7.5  3.  Hypertension and DM  -He will continue follow-up with his primary care physician. -We discussed that we will monitor his blood pressure and blood sugar closely and may need to adjust his medication during the chemotherapy   4. Peripheral neuropathy, secondary to chemotherapy, G1 -Overall improved. Stable lately. -Continue Neurontin  200 mg 3 times a day -We'll continue observation.  Plan: -lab reviewed, adequate for treatment, Cycle 9 FOLFIRI tomorrow and continue every 2 weeks, Neulasta on day 3 - CT chest abdomen and pelvis with IV contrast in 2 weeks -  I'll see him back in 2 weeks - consider IV Feraheme if ferritin, serum iron level low  Truitt Merle  07/23/2015

## 2015-07-24 ENCOUNTER — Telehealth: Payer: Self-pay | Admitting: Hematology

## 2015-07-24 ENCOUNTER — Ambulatory Visit (HOSPITAL_BASED_OUTPATIENT_CLINIC_OR_DEPARTMENT_OTHER): Payer: Medicare Other

## 2015-07-24 ENCOUNTER — Telehealth: Payer: Self-pay | Admitting: *Deleted

## 2015-07-24 VITALS — BP 123/53 | HR 71 | Temp 97.5°F | Resp 16

## 2015-07-24 DIAGNOSIS — Z5111 Encounter for antineoplastic chemotherapy: Secondary | ICD-10-CM

## 2015-07-24 DIAGNOSIS — C179 Malignant neoplasm of small intestine, unspecified: Secondary | ICD-10-CM | POA: Diagnosis not present

## 2015-07-24 MED ORDER — ATROPINE SULFATE 1 MG/ML IJ SOLN
INTRAMUSCULAR | Status: AC
  Filled 2015-07-24: qty 1

## 2015-07-24 MED ORDER — SODIUM CHLORIDE 0.9 % IV SOLN
Freq: Once | INTRAVENOUS | Status: AC
  Administered 2015-07-24: 09:00:00 via INTRAVENOUS

## 2015-07-24 MED ORDER — SODIUM CHLORIDE 0.9 % IV SOLN
2000.0000 mg/m2 | INTRAVENOUS | Status: DC
  Administered 2015-07-24: 4250 mg via INTRAVENOUS
  Filled 2015-07-24: qty 85

## 2015-07-24 MED ORDER — LEUCOVORIN CALCIUM INJECTION 350 MG
400.0000 mg/m2 | Freq: Once | INTRAMUSCULAR | Status: AC
  Administered 2015-07-24: 848 mg via INTRAVENOUS
  Filled 2015-07-24: qty 42.4

## 2015-07-24 MED ORDER — ATROPINE SULFATE 1 MG/ML IJ SOLN
0.5000 mg | Freq: Once | INTRAMUSCULAR | Status: AC | PRN
  Administered 2015-07-24: 0.5 mg via INTRAVENOUS

## 2015-07-24 MED ORDER — SODIUM CHLORIDE 0.9 % IV SOLN
Freq: Once | INTRAVENOUS | Status: AC
  Administered 2015-07-24: 09:00:00 via INTRAVENOUS
  Filled 2015-07-24: qty 8

## 2015-07-24 MED ORDER — SODIUM CHLORIDE 0.9 % IV SOLN
165.0000 mg/m2 | Freq: Once | INTRAVENOUS | Status: AC
  Administered 2015-07-24: 350 mg via INTRAVENOUS
  Filled 2015-07-24: qty 5.33

## 2015-07-24 NOTE — Patient Instructions (Signed)
[facility] Discharge Instructions for Patients Receiving Chemotherapy  Today you received the following chemotherapy agents Irinotecan/Leucovorin/Fluorouracil.  To help prevent nausea and vomiting after your treatment, we encourage you to take your nausea medication as directed.   If you develop nausea and vomiting that is not controlled by your nausea medication, call the clinic.   BELOW ARE SYMPTOMS THAT SHOULD BE REPORTED IMMEDIATELY:  *FEVER GREATER THAN 100.5 F  *CHILLS WITH OR WITHOUT FEVER  NAUSEA AND VOMITING THAT IS NOT CONTROLLED WITH YOUR NAUSEA MEDICATION  *UNUSUAL SHORTNESS OF BREATH  *UNUSUAL BRUISING OR BLEEDING  TENDERNESS IN MOUTH AND THROAT WITH OR WITHOUT PRESENCE OF ULCERS  *URINARY PROBLEMS  *BOWEL PROBLEMS  UNUSUAL RASH Items with * indicate a potential emergency and should be followed up as soon as possible.  Feel free to call the clinic you have any questions or concerns. The clinic phone number is [phone].  Please show the CHEMO ALERT CARD at check-in to the Emergency Department and triage nurse.    

## 2015-07-24 NOTE — Telephone Encounter (Signed)
Pt confirmed labs/ov per 02/21 POF, gave pt AVS and Calendar... KJ, sent msg to add chemo °

## 2015-07-24 NOTE — Telephone Encounter (Signed)
Per staff message and POF I have scheduled appts. Advised scheduler of appts. JMW  

## 2015-07-26 ENCOUNTER — Ambulatory Visit (HOSPITAL_BASED_OUTPATIENT_CLINIC_OR_DEPARTMENT_OTHER): Payer: Medicare Other

## 2015-07-26 ENCOUNTER — Ambulatory Visit: Payer: Medicare Other

## 2015-07-26 VITALS — BP 115/53 | HR 82 | Temp 98.2°F | Resp 18

## 2015-07-26 DIAGNOSIS — C179 Malignant neoplasm of small intestine, unspecified: Secondary | ICD-10-CM | POA: Diagnosis not present

## 2015-07-26 DIAGNOSIS — Z5189 Encounter for other specified aftercare: Secondary | ICD-10-CM | POA: Diagnosis not present

## 2015-07-26 MED ORDER — SODIUM CHLORIDE 0.9 % IJ SOLN
10.0000 mL | INTRAMUSCULAR | Status: DC | PRN
  Administered 2015-07-26: 10 mL
  Filled 2015-07-26: qty 10

## 2015-07-26 MED ORDER — PEGFILGRASTIM INJECTION 6 MG/0.6ML ~~LOC~~
6.0000 mg | PREFILLED_SYRINGE | Freq: Once | SUBCUTANEOUS | Status: AC
  Administered 2015-07-26: 6 mg via SUBCUTANEOUS
  Filled 2015-07-26: qty 0.6

## 2015-07-26 MED ORDER — HEPARIN SOD (PORK) LOCK FLUSH 100 UNIT/ML IV SOLN
500.0000 [IU] | Freq: Once | INTRAVENOUS | Status: AC | PRN
  Administered 2015-07-26: 500 [IU]
  Filled 2015-07-26: qty 5

## 2015-07-26 NOTE — Patient Instructions (Signed)
Pegfilgrastim injection What is this medicine? PEGFILGRASTIM (PEG fil gra stim) is a long-acting granulocyte colony-stimulating factor that stimulates the growth of neutrophils, a type of white blood cell important in the body's fight against infection. It is used to reduce the incidence of fever and infection in patients with certain types of cancer who are receiving chemotherapy that affects the bone marrow, and to increase survival after being exposed to high doses of radiation. This medicine may be used for other purposes; ask your health care provider or pharmacist if you have questions. What should I tell my health care provider before I take this medicine? They need to know if you have any of these conditions: -kidney disease -latex allergy -ongoing radiation therapy -sickle cell disease -skin reactions to acrylic adhesives (On-Body Injector only) -an unusual or allergic reaction to pegfilgrastim, filgrastim, other medicines, foods, dyes, or preservatives -pregnant or trying to get pregnant -breast-feeding How should I use this medicine? This medicine is for injection under the skin. If you get this medicine at home, you will be taught how to prepare and give the pre-filled syringe or how to use the On-body Injector. Refer to the patient Instructions for Use for detailed instructions. Use exactly as directed. Take your medicine at regular intervals. Do not take your medicine more often than directed. It is important that you put your used needles and syringes in a special sharps container. Do not put them in a trash can. If you do not have a sharps container, call your pharmacist or healthcare provider to get one. Talk to your pediatrician regarding the use of this medicine in children. While this drug may be prescribed for selected conditions, precautions do apply. Overdosage: If you think you have taken too much of this medicine contact a poison control center or emergency room at  once. NOTE: This medicine is only for you. Do not share this medicine with others. What if I miss a dose? It is important not to miss your dose. Call your doctor or health care professional if you miss your dose. If you miss a dose due to an On-body Injector failure or leakage, a new dose should be administered as soon as possible using a single prefilled syringe for manual use. What may interact with this medicine? Interactions have not been studied. Give your health care provider a list of all the medicines, herbs, non-prescription drugs, or dietary supplements you use. Also tell them if you smoke, drink alcohol, or use illegal drugs. Some items may interact with your medicine. This list may not describe all possible interactions. Give your health care provider a list of all the medicines, herbs, non-prescription drugs, or dietary supplements you use. Also tell them if you smoke, drink alcohol, or use illegal drugs. Some items may interact with your medicine. What should I watch for while using this medicine? You may need blood work done while you are taking this medicine. If you are going to need a MRI, CT scan, or other procedure, tell your doctor that you are using this medicine (On-Body Injector only). What side effects may I notice from receiving this medicine? Side effects that you should report to your doctor or health care professional as soon as possible: -allergic reactions like skin rash, itching or hives, swelling of the face, lips, or tongue -dizziness -fever -pain, redness, or irritation at site where injected -pinpoint red spots on the skin -red or dark-brown urine -shortness of breath or breathing problems -stomach or side pain, or pain   at the shoulder -swelling -tiredness -trouble passing urine or change in the amount of urine Side effects that usually do not require medical attention (report to your doctor or health care professional if they continue or are  bothersome): -bone pain -muscle pain This list may not describe all possible side effects. Call your doctor for medical advice about side effects. You may report side effects to FDA at 1-800-FDA-1088. Where should I keep my medicine? Keep out of the reach of children. Store pre-filled syringes in a refrigerator between 2 and 8 degrees C (36 and 46 degrees F). Do not freeze. Keep in carton to protect from light. Throw away this medicine if it is left out of the refrigerator for more than 48 hours. Throw away any unused medicine after the expiration date. NOTE: This sheet is a summary. It may not cover all possible information. If you have questions about this medicine, talk to your doctor, pharmacist, or health care provider.    2016, Elsevier/Gold Standard. (2014-06-08 14:30:14)  

## 2015-07-26 NOTE — Progress Notes (Signed)
Neulasta injection given by infusion nurse after home infusion pump disconnected 

## 2015-08-06 ENCOUNTER — Encounter (HOSPITAL_COMMUNITY): Payer: Self-pay

## 2015-08-06 ENCOUNTER — Other Ambulatory Visit (HOSPITAL_BASED_OUTPATIENT_CLINIC_OR_DEPARTMENT_OTHER): Payer: Medicare Other

## 2015-08-06 ENCOUNTER — Ambulatory Visit (HOSPITAL_COMMUNITY)
Admission: RE | Admit: 2015-08-06 | Discharge: 2015-08-06 | Disposition: A | Discharge status: Home / Self Care | Payer: Medicare Other | Source: Ambulatory Visit | Attending: Hematology | Admitting: Hematology

## 2015-08-06 DIAGNOSIS — K599 Functional intestinal disorder, unspecified: Secondary | ICD-10-CM | POA: Diagnosis not present

## 2015-08-06 DIAGNOSIS — K5730 Diverticulosis of large intestine without perforation or abscess without bleeding: Secondary | ICD-10-CM | POA: Diagnosis not present

## 2015-08-06 DIAGNOSIS — M5136 Other intervertebral disc degeneration, lumbar region: Secondary | ICD-10-CM | POA: Diagnosis not present

## 2015-08-06 DIAGNOSIS — R911 Solitary pulmonary nodule: Secondary | ICD-10-CM | POA: Diagnosis not present

## 2015-08-06 DIAGNOSIS — K8020 Calculus of gallbladder without cholecystitis without obstruction: Secondary | ICD-10-CM | POA: Insufficient documentation

## 2015-08-06 DIAGNOSIS — K769 Liver disease, unspecified: Secondary | ICD-10-CM | POA: Diagnosis not present

## 2015-08-06 DIAGNOSIS — C179 Malignant neoplasm of small intestine, unspecified: Secondary | ICD-10-CM | POA: Insufficient documentation

## 2015-08-06 DIAGNOSIS — M47816 Spondylosis without myelopathy or radiculopathy, lumbar region: Secondary | ICD-10-CM | POA: Insufficient documentation

## 2015-08-06 LAB — CBC WITH DIFFERENTIAL/PLATELET
BASO%: 0.3 % (ref 0.0–2.0)
BASOS ABS: 0 10*3/uL (ref 0.0–0.1)
EOS%: 0.7 % (ref 0.0–7.0)
Eosinophils Absolute: 0.1 10*3/uL (ref 0.0–0.5)
HEMATOCRIT: 27.6 % — AB (ref 38.4–49.9)
HGB: 8.8 g/dL — ABNORMAL LOW (ref 13.0–17.1)
LYMPH#: 1.9 10*3/uL (ref 0.9–3.3)
LYMPH%: 20 % (ref 14.0–49.0)
MCH: 29.3 pg (ref 27.2–33.4)
MCHC: 32 g/dL (ref 32.0–36.0)
MCV: 91.7 fL (ref 79.3–98.0)
MONO#: 1.2 10*3/uL — ABNORMAL HIGH (ref 0.1–0.9)
MONO%: 12.1 % (ref 0.0–14.0)
NEUT#: 6.4 10*3/uL (ref 1.5–6.5)
NEUT%: 66.9 % (ref 39.0–75.0)
PLATELETS: 183 10*3/uL (ref 140–400)
RBC: 3.01 10*6/uL — ABNORMAL LOW (ref 4.20–5.82)
RDW: 20.2 % — ABNORMAL HIGH (ref 11.0–14.6)
WBC: 9.6 10*3/uL (ref 4.0–10.3)

## 2015-08-06 LAB — COMPREHENSIVE METABOLIC PANEL
ALBUMIN: 3 g/dL — AB (ref 3.5–5.0)
ALK PHOS: 113 U/L (ref 40–150)
ALT: 22 U/L (ref 0–55)
AST: 30 U/L (ref 5–34)
Anion Gap: 10 mEq/L (ref 3–11)
BUN: 12.8 mg/dL (ref 7.0–26.0)
CHLORIDE: 108 meq/L (ref 98–109)
CO2: 22 mEq/L (ref 22–29)
Calcium: 8.4 mg/dL (ref 8.4–10.4)
Creatinine: 1.1 mg/dL (ref 0.7–1.3)
EGFR: 67 mL/min/{1.73_m2} — AB (ref >90)
GLUCOSE: 71 mg/dL (ref 70–140)
POTASSIUM: 3.7 meq/L (ref 3.5–5.1)
SODIUM: 140 meq/L (ref 136–145)
Total Bilirubin: <0.3 mg/dL (ref 0.20–1.20)
Total Protein: 6.1 g/dL — ABNORMAL LOW (ref 6.4–8.3)

## 2015-08-06 MED ORDER — IOHEXOL 300 MG/ML  SOLN
100.0000 mL | Freq: Once | INTRAMUSCULAR | Status: AC | PRN
  Administered 2015-08-06: 100 mL via INTRAVENOUS

## 2015-08-07 ENCOUNTER — Ambulatory Visit (HOSPITAL_BASED_OUTPATIENT_CLINIC_OR_DEPARTMENT_OTHER): Payer: Medicare Other

## 2015-08-07 ENCOUNTER — Encounter: Payer: Self-pay | Admitting: Hematology

## 2015-08-07 ENCOUNTER — Ambulatory Visit (HOSPITAL_BASED_OUTPATIENT_CLINIC_OR_DEPARTMENT_OTHER): Payer: Medicare Other | Admitting: Hematology

## 2015-08-07 VITALS — BP 103/41 | HR 77 | Temp 98.5°F | Resp 18 | Ht 66.0 in | Wt 190.7 lb

## 2015-08-07 DIAGNOSIS — K521 Toxic gastroenteritis and colitis: Secondary | ICD-10-CM

## 2015-08-07 DIAGNOSIS — C772 Secondary and unspecified malignant neoplasm of intra-abdominal lymph nodes: Secondary | ICD-10-CM

## 2015-08-07 DIAGNOSIS — C787 Secondary malignant neoplasm of liver and intrahepatic bile duct: Secondary | ICD-10-CM

## 2015-08-07 DIAGNOSIS — I10 Essential (primary) hypertension: Secondary | ICD-10-CM

## 2015-08-07 DIAGNOSIS — T451X5A Adverse effect of antineoplastic and immunosuppressive drugs, initial encounter: Secondary | ICD-10-CM

## 2015-08-07 DIAGNOSIS — E119 Type 2 diabetes mellitus without complications: Secondary | ICD-10-CM

## 2015-08-07 DIAGNOSIS — Z5111 Encounter for antineoplastic chemotherapy: Secondary | ICD-10-CM | POA: Diagnosis not present

## 2015-08-07 DIAGNOSIS — C179 Malignant neoplasm of small intestine, unspecified: Secondary | ICD-10-CM

## 2015-08-07 DIAGNOSIS — D6481 Anemia due to antineoplastic chemotherapy: Secondary | ICD-10-CM | POA: Diagnosis not present

## 2015-08-07 DIAGNOSIS — G620 Drug-induced polyneuropathy: Secondary | ICD-10-CM

## 2015-08-07 DIAGNOSIS — D630 Anemia in neoplastic disease: Secondary | ICD-10-CM

## 2015-08-07 DIAGNOSIS — G622 Polyneuropathy due to other toxic agents: Secondary | ICD-10-CM

## 2015-08-07 DIAGNOSIS — D500 Iron deficiency anemia secondary to blood loss (chronic): Secondary | ICD-10-CM

## 2015-08-07 LAB — CEA: CEA: 50.5 ng/mL — ABNORMAL HIGH (ref 0.0–4.7)

## 2015-08-07 MED ORDER — ATROPINE SULFATE 1 MG/ML IJ SOLN
INTRAMUSCULAR | Status: AC
  Filled 2015-08-07: qty 1

## 2015-08-07 MED ORDER — SODIUM CHLORIDE 0.9 % IV SOLN
400.0000 mg/m2 | Freq: Once | INTRAVENOUS | Status: AC
  Administered 2015-08-07: 848 mg via INTRAVENOUS
  Filled 2015-08-07: qty 42.4

## 2015-08-07 MED ORDER — FLUOROURACIL CHEMO INJECTION 5 GM/100ML
2000.0000 mg/m2 | INTRAVENOUS | Status: DC
  Administered 2015-08-07: 4250 mg via INTRAVENOUS
  Filled 2015-08-07: qty 85

## 2015-08-07 MED ORDER — ATROPINE SULFATE 1 MG/ML IJ SOLN
0.5000 mg | Freq: Once | INTRAMUSCULAR | Status: AC | PRN
  Administered 2015-08-07: 0.5 mg via INTRAVENOUS

## 2015-08-07 MED ORDER — SODIUM CHLORIDE 0.9 % IV SOLN
Freq: Once | INTRAVENOUS | Status: AC
  Administered 2015-08-07: 10:00:00 via INTRAVENOUS
  Filled 2015-08-07: qty 8

## 2015-08-07 MED ORDER — SODIUM CHLORIDE 0.9 % IV SOLN
Freq: Once | INTRAVENOUS | Status: AC
  Administered 2015-08-07: 10:00:00 via INTRAVENOUS

## 2015-08-07 MED ORDER — SODIUM CHLORIDE 0.9 % IV SOLN
165.0000 mg/m2 | Freq: Once | INTRAVENOUS | Status: AC
  Administered 2015-08-07: 350 mg via INTRAVENOUS
  Filled 2015-08-07: qty 5.67

## 2015-08-07 NOTE — Progress Notes (Signed)
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    08/18/2014 Miscellaneous tumor KRAS mutation (-)   08/18/2014 Pathology Results Liver biopsy showed metastatic adenocarcinoma, consistent with GI primary   08/18/2014 Initial Diagnosis Small bowel cancer   08/18/2014 Procedure small bowel enteroscopy with biopsy by Dr. Benson Norway showed at the proximal jejunum there was evidence of abnormal mucosa and friability, biopsy was obtained.    09/05/2014 Imaging PET hypermetabolic mass involving a small bowel loop with adjacent mesenteric lymphadenopathy, and diffuse liver metastasis and mild hypermetabolic lymphadenopathy in porta hepatis.   09/06/2014 - 02/01/2015 Chemotherapy mFOLFOX, stopped due to his neuropathy, and earlier mild disease progression on PET    09/07/2014 - 09/11/2014 Hospital Admission He was admitted for fever and GI bleeding. Received 3 units of RBC.   10/26/2014 Imaging Interval significant improvement in the primary small bowel mass, adjacent lymphadenopathy and extensive hepatic metastatic disease. No other new lesions.    02/09/2015 Imaging PET/CT scan showed stable primary malignancy in the proximal jejunum, mild metabolic progression of the 3 residual metabolic liver metastasis. CT  Portion showed decreased size of his liver metastasis.   02/27/2015 -  Chemotherapy second line chemo FOLFIRI, every 2 weeks, and panitumumab (held for second cycle due to severe skin rashes)    08/06/2015 Imaging Stable number and size of the liver lesions. The left lower lobe nodule is considerably less prominent. No other new lesions.     HISTORY OF PRESENTING ILLNESS:  Tyler Evans 70 y.o. male is here because of a recent diagnosis of small bowel cancer. He presented to his PCP on 08/17/2014 with complaints of feeling weak and a one month history of a cough. He was found to be anemic and was referred to the emergency department. Hemoglobin was found to be 7.6, MCV 76.6; creatinine was elevated at 1.74. Stool was Hemoccult positive.  CT abdomen/pelvis without contrast on 08/17/2014 showed multiple large masses within the liver, bulky in appearance. The largest measured approximately 5.7 cm. There appeared to be a focal filling defect within the proximal jejunum measuring approximately 2.5 x 1.7 cm. There was mild adjacent jejunal wall thickening. The lung bases were clear.  On 08/18/2014 he underwent a small bowel enteroscopy with biopsy by Dr. Benson Norway. The esophagus and gastric lumen were normal. At the proximal jejunum there was evidence of abnormal mucosa and friability. The pediatric colonoscope was not able to traverse the area. Biopsies were obtained. An ultraslim colonoscope was then utilized. This colonoscope was able to traverse the area of stenosis which measured approximately 3 cm in length. 50% of the lumen was ulcerated. 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 MMR stains are pending.  He was discharged home on 08/19/2014.  CURRENT THERAPY: FOLFIRI with 5FU 2417m/m2 (decresed to 20033mm2 from cycle 4) over 46 hours and irinotecan 18019m2 (decreased to 165m77m from cycle 4), every 2 weeks.   INTERIM HISTORY:  Mr. RobbFickurns for  follow-up. He developed moderate diarrhea after her prior cycle chemo, No significant nausea, abdominal pain, fever or chills. He finally start taking Imodium about 3-4 days ago, and his diarrhea much  improved. He otherwise tolerated chemotherapy well, her appetite and eating has not changed much, no significant weight loss recently. His peripheral neuropathy is overall mild and stable, he is not able to pick up small things with fingers, but otherwise his hand function is normal.  MEDICAL HISTORY:  Past Medical History  Diagnosis Date  . Hypertension   . Small bowel cancer ([place]) 08/18/2014  . Diabetes mellitus without complication ([place])     SURGICAL HISTORY: Past Surgical History  Procedure Laterality Date  . Enteroscopy N/A 08/18/2014    Procedure: ENTEROSCOPY;  Surgeon: Carol Ada, MD;  Location: [facility];  Service: Endoscopy;  Laterality: N/A;    SOCIAL HISTORY: History   Social History  . Marital Status: Married    Spouse Name: N/A  . Number of Children: 2  . Years of Education: N/A   Occupational History  .  retired Ambulance person    Social History Main Topics  . Smoking status: Never Smoker   . Smokeless tobacco: Not on file  . Alcohol Use: No  . Drug Use: Not on file  . Sexual Activity: Not on file   Other Topics Concern  . Not on file   Social History Narrative  He lives in [place]. He is married. He has 2 children both reported to be in good health. No tobacco or alcohol use. He is a retired Ambulance person.  FAMILY HISTORY: Family History  Problem Relation Age of Onset  . Heart failure, Alzheimer's  Mother   . Lung cancer Father   . Kidney cancer Brother     ALLERGIES:  is allergic to penicillins.  MEDICATIONS:  Current Outpatient Prescriptions on File Prior to Visit  Medication Sig Dispense Refill  . atorvastatin (LIPITOR) 20 MG tablet Take 20 mg by mouth daily.    . Blood Glucose Monitoring Suppl (ONE TOUCH ULTRA MINI) W/DEVICE KIT     . clindamycin (CLINDAGEL) 1 % gel Apply topically 2 (two) times daily. 30 g 2  . gabapentin (NEURONTIN) 100 MG capsule Take 2 capsules (200 mg total) by mouth 3 (three) times daily. 150 capsule 2  .  HUMALOG 100 UNIT/ML injection     . hydrocortisone 2.5 % cream Apply topically 2 (two) times daily. 30 g 2  . LANTUS SOLOSTAR 100 UNIT/ML Solostar Pen     . lidocaine-prilocaine (EMLA) cream APPLY TO AFFECTED AREA ONCE  3  . lisinopril (PRINIVIL,ZESTRIL) 20 MG tablet Take 20 mg by mouth daily.    . ONE TOUCH ULTRA TEST test strip     . potassium chloride SA (K-DUR,KLOR-CON) 20 MEQ tablet Take 1 tablet (20 mEq total) by mouth daily. Take 20 mEq by mouth daily for  10  Days. 30 tablet 2  . PRESCRIPTION MEDICATION See admin instructions. Receives Vectibix, Leucovorin, and Adrucil chemotherapy every 2 weeks.    . tamsulosin (FLOMAX) 0.4 MG CAPS capsule Take 0.4 mg by mouth daily.  12   No current facility-administered medications on file prior to visit.  ;  REVIEW OF SYSTEMS:   Constitutional: Denies fevers, chills or abnormal night sweats; mild fatigue, weight is stable.  Eyes: Denies blurriness of vision, double vision or watery eyes Ears, nose, mouth, throat, and face: Denies mucositis or sore throat Respiratory: One month history of a nonproductive cough;  no shortness  of breath Cardiovascular: Denies palpitation, chest discomfort or lower extremity swelling Gastrointestinal:  Denies nausea, heartburn. No dysphagia. He has noted less frequent bowel movements over the past 2 weeks. Does not characterize this as constipation. Skin: Denies abnormal skin rashes Lymphatics: Denies new lymphadenopathy or easy bruising Neurological: Denies numbness, tingling. No extremity weakness Behavioral/Psych: Mood is stable, no new changes  All other systems were reviewed with the patient and are negative.  PHYSICAL EXAMINATION: ECOG PERFORMANCE STATUS: 1  Filed Vitals:   08/07/15 0905  BP: 103/41  Pulse: 77  Temp: 98.5 F (36.9 C)  Resp: 18   Filed Weights   08/07/15 0905  Weight: 190 lb 11.2 oz (86.501 kg)    GENERAL:alert, no distress and comfortable SKIN: skin color, texture, turgor are  normal, (+) skin pigmentation from his rash on his face, neck, chest and upper abdomen.  EYES: normal, conjunctiva are pink and non-injected, sclera clear OROPHARYNX:no exudate, no erythema and lips, buccal mucosa, and tongue normal  NECK: supple, thyroid normal size, non-tender, without nodularity LYMPH:  no palpable lymphadenopathy in the cervical, axillary or inguinal regions LUNGS: clear to auscultation and percussion with normal breathing effort HEART: regular rate & rhythm and no murmurs and no lower extremity edema ABDOMEN:abdomen soft, mild tenderness at the right upper quadrant , no rebound pain, normal bowel sounds Musculoskeletal:no cyanosis of digits and no clubbing  PSYCH: alert & oriented x 3 with fluent speech NEURO: no focal motor deficits, his light touch sensation and vibration sensation are diminished on his hands and feet.   LABORATORY DATA:  I have reviewed the data as listed CBC Latest Ref Rng 08/06/2015 07/23/2015 07/09/2015  WBC 4.0 - 10.3 10e3/uL 9.6 10.8(H) 5.3  Hemoglobin 13.0 - 17.1 g/dL 8.8(L) 8.2(L) 7.8(L)  Hematocrit 38.4 - 49.9 % 27.6(L) 26.6(L) 24.7(L)  Platelets 140 - 400 10e3/uL 183 134(L) 131(L)    CMP Latest Ref Rng 08/06/2015 07/23/2015 07/09/2015  Glucose 70 - 140 mg/dl 71 64(L) 210(H)  BUN 7.0 - 26.0 mg/dL 12.8 9.2 11.9  Creatinine 0.7 - 1.3 mg/dL 1.1 1.0 1.2  Sodium 136 - 145 mEq/L 140 143 144  Potassium 3.5 - 5.1 mEq/L 3.7 4.4 3.5  CO2 22 - 29 mEq/L _0 Calcium 8.4 - 10.4 mg/dL 8.4 8.7 8.7  Total Protein 6.4 - 8.3 g/dL 6.1(L) 6.3(L) 6.0(L)  Total Bilirubin 0.20 - 1.20 mg/dL <0.30 <0.30 <0.30  Alkaline Phos 40 - 150 U/L 113 125 106  AST 5 - 34 U/L _1 ALT 0 - 55 U/L _2 Results for ABIJAH, ROUSSEL (MRN [id number]) as of 08/07/2015 07:22  Ref. Range 07/23/2015 07:59  Iron Latest Ref Range: 42-163 ug/dL 59  UIBC Latest Ref Range: 117-376 ug/dL 209  TIBC Latest Ref Range: 202-409 ug/dL 269  %SAT Latest Ref Range: 20-55 % 22    Ferritin Latest Ref Range: 22-316 ng/ml 451 (H)      Pathology report:  Small Intestine Biopsy, jejunal mass 08/18/2014 - INVASIVE ADENOCARCINOMA IN A BACKGROUND OF TUBULOVILLOUS ADENOMA. ADDITIONAL INFORMATION: Mismatch Repair (MMR) Protein Immunohistochemistry (IHC) IHC Expression Result (LIMITED TUMOR): MLH1: Preserved nuclear expression (greater 50% tumor expression) MSH2: Preserved nuclear expression (greater 50% tumor expression) MSH6: Preserved nuclear expression (greater 50% tumor expression) PMS2: Preserved nuclear expression (greater 50% tumor expression) * Internal control demonstrates intact nuclear expression Interpretation: NORMAL There is preserved expression  Diagnosis 08/28/2014  Liver, needle/core biopsy, right lobe - METASTATIC ADENOCARCINOMA   RADIOGRAPHIC STUDIES:  I have personally reviewed the radiological images as listed and agreed with the findings in the report.   CT chest, abdomen and pelvis with contrast 08/06/2015 IMPRESSION: 1. Stable number and size of the liver lesions. The previous small bowel tumor in the vicinity of the ligament of Treitz is not well seen. 2. The left lower lobe nodule of concern for enlargement on the prior exam is actually considerably less prominent on today' s exam, with only faint ground-glass density in this vicinity currently. This merits observation but the resolution of overt solid nodularity is reassuring. 3. There is new abnormal wall thickening of several loops of distal small bowel, which could be from infectious enteritis or inflammatory bowel disease/Crohn' s disease. 4. Other imaging findings of potential clinical significance: Cholelithiasis. Sigmoid colon diverticulosis. Prominent prostate gland. Lumbar spondylosis and degenerative disc disease potentially causing mild impingement at L4-5 L5-S1.   ASSESSMENT & PLAN:  70 year old gentleman with past medical history of diabetes, hypertension, who  presents with anemia and weakness.  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  -Unfortunately he has stage IV disease with diffuse liver metastasis, this is an incurable disease, with overall very poor prognosis. -Is currently on second line FOLFIRI now, tolerating well, dose decreased from cycle 4 due to neutropenia. -I reviewed his staging CT scan results from 05/14/2015 with patient and his wife, he has had a good partial response in the liver metastasis, no other new lesions. He is clinically doing well -his CEA Has been slowly trending up, but overall level is still low, we'll continue monitoring. -I discussed his restaging CT scan from 08/06/2015, which showed stable disease overall, no significant new lesions. -I discussed the role of genomic testing, such as [facility] one, to see if his tumor contains any actionable mutations. The cost and reimbursement issue was discussed with him, he understands there is a possibility high out-of-pocket cost for this test if it's not covered by his insurance. He agrees to proceed, I requested on 07/09/2015, but unfortunately the tumor tissue was not sufficient for the test. -he is tolerating chemo well, will continue   2. Microcytic anemia secondary to GI bleeding, iron deficiency and chemotherapy -His serum iron level and saturation were low, although ferritin is normal, he has some degree of iron deficient anemia, and anemia of malignancy  -Consider blood transfusion if hemoglobin less than 8 or symptomatic anemia -he received IV feraheme 510 mg twice in 09/2014, repeated iron study was normal on 07/23/2015 -He has restarted oral iron pill, and his anemia has improved.  3.  Hypertension and DM  -He will continue follow-up with his primary care physician. -We discussed that we will  monitor his blood pressure and blood sugar closely and may need to adjust his medication during the chemotherapy  -His blood glucose has been low lately, he had a few episodes of nocturnal hypoglycemia, I encouraged him to contact his primary care physician to adjust his insulin dose.  4. Peripheral neuropathy, secondary to chemotherapy, G1 -Overall improved. Stable lately. -Continue Neurontin  200 mg 3 times a day -We'll continue observation.  5. Diarrhea -Secondary to chemotherapy, no signs of infection. -He knows to take Imodium as needed after chemotherapy, and do not wait too long before takes it   Plan: -CT scan reviewed with pt  -lab reviewed, adequate for treatment, Cycle 11 FOLFIRI  today and continue every 2 weeks, Neulasta on day 3 -I encourage him to call his PCP to see if he needs to decrease his insulin dose. -He knows to use Imodium for diarrhea of chemotherapy -I'll see him back in 2 weeks  Truitt Merle  08/07/2015

## 2015-08-07 NOTE — Patient Instructions (Signed)
[facility] Discharge Instructions for Patients Receiving Chemotherapy  Today you received the following chemotherapy agents Irinotecan/Leucovorin/Fluorouracil.  To help prevent nausea and vomiting after your treatment, we encourage you to take your nausea medication as directed.   If you develop nausea and vomiting that is not controlled by your nausea medication, call the clinic.   BELOW ARE SYMPTOMS THAT SHOULD BE REPORTED IMMEDIATELY:  *FEVER GREATER THAN 100.5 F  *CHILLS WITH OR WITHOUT FEVER  NAUSEA AND VOMITING THAT IS NOT CONTROLLED WITH YOUR NAUSEA MEDICATION  *UNUSUAL SHORTNESS OF BREATH  *UNUSUAL BRUISING OR BLEEDING  TENDERNESS IN MOUTH AND THROAT WITH OR WITHOUT PRESENCE OF ULCERS  *URINARY PROBLEMS  *BOWEL PROBLEMS  UNUSUAL RASH Items with * indicate a potential emergency and should be followed up as soon as possible.  Feel free to call the clinic you have any questions or concerns. The clinic phone number is [phone].  Please show the CHEMO ALERT CARD at check-in to the Emergency Department and triage nurse.    

## 2015-08-09 ENCOUNTER — Ambulatory Visit (HOSPITAL_BASED_OUTPATIENT_CLINIC_OR_DEPARTMENT_OTHER): Payer: Medicare Other

## 2015-08-09 ENCOUNTER — Encounter (HOSPITAL_COMMUNITY): Payer: Self-pay

## 2015-08-09 VITALS — BP 115/50 | HR 66 | Temp 97.8°F | Resp 18

## 2015-08-09 DIAGNOSIS — C179 Malignant neoplasm of small intestine, unspecified: Secondary | ICD-10-CM | POA: Diagnosis not present

## 2015-08-09 DIAGNOSIS — C772 Secondary and unspecified malignant neoplasm of intra-abdominal lymph nodes: Secondary | ICD-10-CM

## 2015-08-09 DIAGNOSIS — Z5189 Encounter for other specified aftercare: Secondary | ICD-10-CM | POA: Diagnosis not present

## 2015-08-09 DIAGNOSIS — C787 Secondary malignant neoplasm of liver and intrahepatic bile duct: Secondary | ICD-10-CM | POA: Diagnosis not present

## 2015-08-09 MED ORDER — HEPARIN SOD (PORK) LOCK FLUSH 100 UNIT/ML IV SOLN
500.0000 [IU] | Freq: Once | INTRAVENOUS | Status: AC | PRN
  Administered 2015-08-09: 500 [IU]
  Filled 2015-08-09: qty 5

## 2015-08-09 MED ORDER — PEGFILGRASTIM INJECTION 6 MG/0.6ML ~~LOC~~
6.0000 mg | PREFILLED_SYRINGE | Freq: Once | SUBCUTANEOUS | Status: AC
  Administered 2015-08-09: 6 mg via SUBCUTANEOUS
  Filled 2015-08-09: qty 0.6

## 2015-08-09 MED ORDER — SODIUM CHLORIDE 0.9 % IJ SOLN
10.0000 mL | INTRAMUSCULAR | Status: DC | PRN
  Administered 2015-08-09: 10 mL
  Filled 2015-08-09: qty 10

## 2015-08-20 ENCOUNTER — Ambulatory Visit (HOSPITAL_COMMUNITY)
Admission: RE | Admit: 2015-08-20 | Discharge: 2015-08-20 | Disposition: A | Discharge status: Home / Self Care | Payer: Medicare Other | Source: Ambulatory Visit | Attending: Hematology | Admitting: Hematology

## 2015-08-20 ENCOUNTER — Encounter: Payer: Self-pay | Admitting: Hematology

## 2015-08-20 ENCOUNTER — Other Ambulatory Visit (HOSPITAL_BASED_OUTPATIENT_CLINIC_OR_DEPARTMENT_OTHER): Payer: Medicare Other

## 2015-08-20 ENCOUNTER — Ambulatory Visit (HOSPITAL_BASED_OUTPATIENT_CLINIC_OR_DEPARTMENT_OTHER): Payer: Medicare Other | Admitting: Hematology

## 2015-08-20 ENCOUNTER — Telehealth: Payer: Self-pay | Admitting: Hematology

## 2015-08-20 VITALS — BP 109/55 | HR 67 | Temp 97.6°F | Resp 17 | Ht 66.0 in | Wt 191.2 lb

## 2015-08-20 DIAGNOSIS — D630 Anemia in neoplastic disease: Secondary | ICD-10-CM

## 2015-08-20 DIAGNOSIS — D6481 Anemia due to antineoplastic chemotherapy: Secondary | ICD-10-CM

## 2015-08-20 DIAGNOSIS — C772 Secondary and unspecified malignant neoplasm of intra-abdominal lymph nodes: Secondary | ICD-10-CM

## 2015-08-20 DIAGNOSIS — D500 Iron deficiency anemia secondary to blood loss (chronic): Secondary | ICD-10-CM

## 2015-08-20 DIAGNOSIS — I10 Essential (primary) hypertension: Secondary | ICD-10-CM

## 2015-08-20 DIAGNOSIS — C787 Secondary malignant neoplasm of liver and intrahepatic bile duct: Secondary | ICD-10-CM

## 2015-08-20 DIAGNOSIS — E119 Type 2 diabetes mellitus without complications: Secondary | ICD-10-CM

## 2015-08-20 DIAGNOSIS — G622 Polyneuropathy due to other toxic agents: Secondary | ICD-10-CM

## 2015-08-20 DIAGNOSIS — C179 Malignant neoplasm of small intestine, unspecified: Secondary | ICD-10-CM | POA: Diagnosis not present

## 2015-08-20 DIAGNOSIS — D509 Iron deficiency anemia, unspecified: Secondary | ICD-10-CM

## 2015-08-20 DIAGNOSIS — G620 Drug-induced polyneuropathy: Secondary | ICD-10-CM

## 2015-08-20 DIAGNOSIS — T451X5A Adverse effect of antineoplastic and immunosuppressive drugs, initial encounter: Secondary | ICD-10-CM

## 2015-08-20 DIAGNOSIS — K521 Toxic gastroenteritis and colitis: Secondary | ICD-10-CM

## 2015-08-20 LAB — COMPREHENSIVE METABOLIC PANEL
ALK PHOS: 115 U/L (ref 40–150)
ALT: 18 U/L (ref 0–55)
ANION GAP: 8 meq/L (ref 3–11)
AST: 22 U/L (ref 5–34)
Albumin: 2.9 g/dL — ABNORMAL LOW (ref 3.5–5.0)
BUN: 12.2 mg/dL (ref 7.0–26.0)
CO2: 28 meq/L (ref 22–29)
Calcium: 8.4 mg/dL (ref 8.4–10.4)
Chloride: 108 mEq/L (ref 98–109)
Creatinine: 1.1 mg/dL (ref 0.7–1.3)
EGFR: 68 mL/min/{1.73_m2} — AB (ref >90)
Glucose: 89 mg/dl (ref 70–140)
Potassium: 3.4 mEq/L — ABNORMAL LOW (ref 3.5–5.1)
Sodium: 143 mEq/L (ref 136–145)
TOTAL PROTEIN: 6 g/dL — AB (ref 6.4–8.3)

## 2015-08-20 LAB — CBC WITH DIFFERENTIAL/PLATELET
BASO%: 0.1 % (ref 0.0–2.0)
Basophils Absolute: 0 10*3/uL (ref 0.0–0.1)
EOS ABS: 0 10*3/uL (ref 0.0–0.5)
EOS%: 0.5 % (ref 0.0–7.0)
HCT: 25.8 % — ABNORMAL LOW (ref 38.4–49.9)
HGB: 7.9 g/dL — ABNORMAL LOW (ref 13.0–17.1)
LYMPH%: 17.7 % (ref 14.0–49.0)
MCH: 28.7 pg (ref 27.2–33.4)
MCHC: 30.6 g/dL — ABNORMAL LOW (ref 32.0–36.0)
MCV: 93.8 fL (ref 79.3–98.0)
MONO#: 1 10*3/uL — AB (ref 0.1–0.9)
MONO%: 12.6 % (ref 0.0–14.0)
NEUT%: 69.1 % (ref 39.0–75.0)
NEUTROS ABS: 5.6 10*3/uL (ref 1.5–6.5)
PLATELETS: 130 10*3/uL — AB (ref 140–400)
RBC: 2.75 10*6/uL — AB (ref 4.20–5.82)
RDW: 19.4 % — ABNORMAL HIGH (ref 11.0–14.6)
WBC: 8.2 10*3/uL (ref 4.0–10.3)
lymph#: 1.4 10*3/uL (ref 0.9–3.3)

## 2015-08-20 NOTE — Progress Notes (Signed)
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    08/18/2014 Miscellaneous tumor KRAS mutation (-)   08/18/2014 Pathology Results Liver biopsy showed metastatic adenocarcinoma, consistent with GI primary   08/18/2014 Initial Diagnosis Small bowel cancer   08/18/2014 Procedure small bowel enteroscopy with biopsy by Dr. Benson Norway showed at the proximal jejunum there was evidence of abnormal mucosa and friability, biopsy was obtained.    09/05/2014 Imaging PET hypermetabolic mass involving a small bowel loop with adjacent mesenteric lymphadenopathy, and diffuse liver metastasis and mild hypermetabolic lymphadenopathy in porta hepatis.   09/06/2014 - 02/01/2015 Chemotherapy mFOLFOX, stopped due to his neuropathy, and earlier mild disease progression on PET    09/07/2014 - 09/11/2014 Hospital Admission He was admitted for fever and GI bleeding. Received 3 units of RBC.   10/26/2014 Imaging Interval significant improvement in the primary small bowel mass, adjacent lymphadenopathy and extensive hepatic metastatic disease. No other new lesions.    02/09/2015 Imaging PET/CT scan showed stable primary malignancy in the proximal jejunum, mild metabolic progression of the 3 residual metabolic liver metastasis. CT  Portion showed decreased size of his liver metastasis.   02/27/2015 -  Chemotherapy second line chemo FOLFIRI, every 2 weeks, and panitumumab (held for second cycle due to severe skin rashes)    08/06/2015 Imaging Stable number and size of the liver lesions. The left lower lobe nodule is considerably less prominent. No other new lesions.     HISTORY OF PRESENTING ILLNESS:  Tyler Evans 70 y.o. male is here because of a recent diagnosis of small bowel cancer. He presented to his PCP on 08/17/2014 with complaints of feeling weak and a one month history of a cough. He was found to be anemic and was referred to the emergency department. Hemoglobin was found to be 7.6, MCV 76.6; creatinine was elevated at 1.74. Stool was Hemoccult positive.  CT abdomen/pelvis without contrast on 08/17/2014 showed multiple large masses within the liver, bulky in appearance. The largest measured approximately 5.7 cm. There appeared to be a focal filling defect within the proximal jejunum measuring approximately 2.5 x 1.7 cm. There was mild adjacent jejunal wall thickening. The lung bases were clear.  On 08/18/2014 he underwent a small bowel enteroscopy with biopsy by Dr. Benson Norway. The esophagus and gastric lumen were normal. At the proximal jejunum there was evidence of abnormal mucosa and friability. The pediatric colonoscope was not able to traverse the area. Biopsies were obtained. An ultraslim colonoscope was then utilized. This colonoscope was able to traverse the area of stenosis which measured approximately 3 cm in length. 50% of the lumen was ulcerated. 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 MMR stains are pending.  He was discharged home on 08/19/2014.  CURRENT THERAPY: FOLFIRI with 5FU '2400mg'$ /m2 (decresed to '2000mg'$ /m2 from cycle 4) over 46 hours and irinotecan '180mg'$ /m2 (decreased to '165mg'$ /m2 from cycle 4), every 2 weeks.   INTERIM HISTORY:  Mr. Rueb returns for  follow-up. He overall tolerated the previous cycle chemotherapy better, less diarrhea, he has loose bowel movement 2-3 times a day, he takes Imodium as needed, 1 tablet every few days,  No nausea, change  of appetite or other new complains. His peripheral neuropathy is stable,  No other new complaints.  MEDICAL HISTORY:  Past Medical History  Diagnosis Date  . Hypertension   . Small bowel cancer ([place]) 08/18/2014  . Diabetes mellitus without complication ([place])     SURGICAL HISTORY: Past Surgical History  Procedure Laterality Date  . Enteroscopy N/A 08/18/2014    Procedure: ENTEROSCOPY;  Surgeon: Carol Ada, MD;  Location: [facility];  Service: Endoscopy;  Laterality: N/A;    SOCIAL HISTORY: History   Social History  . Marital Status: Married    Spouse Name: N/A  . Number of Children: 2  . Years of Education: N/A   Occupational History  .  retired Ambulance person    Social History Main Topics  . Smoking status: Never Smoker   . Smokeless tobacco: Not on file  . Alcohol Use: No  . Drug Use: Not on file  . Sexual Activity: Not on file   Other Topics Concern  . Not on file   Social History Narrative  He lives in [place]. He is married. He has 2 children both reported to be in good health. No tobacco or alcohol use. He is a retired Ambulance person.  FAMILY HISTORY: Family History  Problem Relation Age of Onset  . Heart failure, Alzheimer's  Mother   . Lung cancer Father   . Kidney cancer Brother     ALLERGIES:  is allergic to penicillins.  MEDICATIONS:  Current Outpatient Prescriptions on File Prior to Visit  Medication Sig Dispense Refill  . atorvastatin (LIPITOR) 20 MG tablet Take 20 mg by mouth daily.    . Blood Glucose Monitoring Suppl (ONE TOUCH ULTRA MINI) W/DEVICE KIT     . clindamycin (CLINDAGEL) 1 % gel Apply topically 2 (two) times daily. 30 g 2  . gabapentin (NEURONTIN) 100 MG capsule Take 2 capsules (200 mg total) by mouth 3 (three) times daily. 150 capsule 2  . HUMALOG 100 UNIT/ML injection     . hydrocortisone 2.5 % cream Apply topically 2 (two) times daily. 30 g 2  . LANTUS SOLOSTAR 100 UNIT/ML Solostar Pen     . lidocaine-prilocaine (EMLA)  cream APPLY TO AFFECTED AREA ONCE  3  . lisinopril (PRINIVIL,ZESTRIL) 20 MG tablet Take 20 mg by mouth daily.    . ONE TOUCH ULTRA TEST test strip     . potassium chloride SA (K-DUR,KLOR-CON) 20 MEQ tablet Take 1 tablet (20 mEq total) by mouth daily. Take 20 mEq by mouth daily for  10  Days. 30 tablet 2  . PRESCRIPTION MEDICATION See admin instructions. Receives Vectibix, Leucovorin, and Adrucil chemotherapy every 2 weeks.    . tamsulosin (FLOMAX) 0.4 MG CAPS capsule Take 0.4 mg by mouth daily.  12   No current facility-administered medications on file prior to visit.  ;  REVIEW OF SYSTEMS:   Constitutional: Denies fevers, chills or abnormal night sweats; mild fatigue, weight is stable.  Eyes: Denies blurriness of vision, double vision or watery eyes Ears, nose, mouth, throat, and face: Denies mucositis or sore throat Respiratory: One month history of a nonproductive cough;  no shortness of breath Cardiovascular: Denies palpitation, chest discomfort or lower extremity swelling Gastrointestinal:  Denies nausea, heartburn. No dysphagia. He has noted less frequent bowel movements over  the past 2 weeks. Does not characterize this as constipation. Skin: Denies abnormal skin rashes Lymphatics: Denies new lymphadenopathy or easy bruising Neurological: Denies numbness, tingling. No extremity weakness Behavioral/Psych: Mood is stable, no new changes  All other systems were reviewed with the patient and are negative.  PHYSICAL EXAMINATION: ECOG PERFORMANCE STATUS: 1  Filed Vitals:   08/20/15 0851 08/20/15 0854  BP: 118/34 109/55  Pulse: 64 67  Temp: 97.6 F (36.4 C)   Resp: 17    Filed Weights   08/20/15 0851  Weight: 191 lb 3.2 oz (86.728 kg)    GENERAL:alert, no distress and comfortable SKIN: skin color, texture, turgor are normal, (+) skin pigmentation from his rash on his face, neck, chest and upper abdomen.  EYES: normal, conjunctiva are pink and non-injected, sclera  clear OROPHARYNX:no exudate, no erythema and lips, buccal mucosa, and tongue normal  NECK: supple, thyroid normal size, non-tender, without nodularity LYMPH:  no palpable lymphadenopathy in the cervical, axillary or inguinal regions LUNGS: clear to auscultation and percussion with normal breathing effort HEART: regular rate & rhythm and no murmurs and no lower extremity edema ABDOMEN:abdomen soft, mild tenderness at the right upper quadrant , no rebound pain, normal bowel sounds Musculoskeletal:no cyanosis of digits and no clubbing  PSYCH: alert & oriented x 3 with fluent speech NEURO: no focal motor deficits, his light touch sensation and vibration sensation are diminished on his hands and feet.   LABORATORY DATA:  I have reviewed the data as listed CBC Latest Ref Rng 08/20/2015 08/06/2015 07/23/2015  WBC 4.0 - 10.3 10e3/uL 8.2 9.6 10.8(H)  Hemoglobin 13.0 - 17.1 g/dL 7.9(L) 8.8(L) 8.2(L)  Hematocrit 38.4 - 49.9 % 25.8(L) 27.6(L) 26.6(L)  Platelets 140 - 400 10e3/uL 130(L) 183 134(L)    CMP Latest Ref Rng 08/20/2015 08/06/2015 07/23/2015  Glucose 70 - 140 mg/dl 89 71 64(L)  BUN 7.0 - 26.0 mg/dL 12.2 12.8 9.2  Creatinine 0.7 - 1.3 mg/dL 1.1 1.1 1.0  Sodium 136 - 145 mEq/L 143 140 143  Potassium 3.5 - 5.1 mEq/L 3.4(L) 3.7 4.4  CO2 22 - 29 mEq/L '28 22 24  '$ Calcium 8.4 - 10.4 mg/dL 8.4 8.4 8.7  Total Protein 6.4 - 8.3 g/dL 6.0(L) 6.1(L) 6.3(L)  Total Bilirubin 0.20 - 1.20 mg/dL <0.30 <0.30 <0.30  Alkaline Phos 40 - 150 U/L 115 113 125  AST 5 - 34 U/L '22 30 28  '$ ALT 0 - 55 U/L '18 22 22   '$ Results for WINDSOR, GOEKEN (MRN [id number]) as of 08/07/2015 07:22  Ref. Range 07/23/2015 07:59  Iron Latest Ref Range: 42-163 ug/dL 59  UIBC Latest Ref Range: 117-376 ug/dL 209  TIBC Latest Ref Range: 202-409 ug/dL 269  %SAT Latest Ref Range: 20-55 % 22  Ferritin Latest Ref Range: 22-316 ng/ml 451 (H)     Pathology report:  Small Intestine Biopsy, jejunal mass 08/18/2014 - INVASIVE ADENOCARCINOMA IN A  BACKGROUND OF TUBULOVILLOUS ADENOMA. ADDITIONAL INFORMATION: Mismatch Repair (MMR) Protein Immunohistochemistry (IHC) IHC Expression Result (LIMITED TUMOR): MLH1: Preserved nuclear expression (greater 50% tumor expression) MSH2: Preserved nuclear expression (greater 50% tumor expression) MSH6: Preserved nuclear expression (greater 50% tumor expression) PMS2: Preserved nuclear expression (greater 50% tumor expression) * Internal control demonstrates intact nuclear expression Interpretation: NORMAL There is preserved expression  Diagnosis 08/28/2014  Liver, needle/core biopsy, right lobe - METASTATIC ADENOCARCINOMA   RADIOGRAPHIC STUDIES: I have personally reviewed the radiological images as listed and agreed with the findings in the report.   CT chest, abdomen  and pelvis with contrast 08/06/2015 IMPRESSION: 1. Stable number and size of the liver lesions. The previous small bowel tumor in the vicinity of the ligament of Treitz is not well seen. 2. The left lower lobe nodule of concern for enlargement on the prior exam is actually considerably less prominent on today' s exam, with only faint ground-glass density in this vicinity currently. This merits observation but the resolution of overt solid nodularity is reassuring. 3. There is new abnormal wall thickening of several loops of distal small bowel, which could be from infectious enteritis or inflammatory bowel disease/Crohn' s disease. 4. Other imaging findings of potential clinical significance: Cholelithiasis. Sigmoid colon diverticulosis. Prominent prostate gland. Lumbar spondylosis and degenerative disc disease potentially causing mild impingement at L4-5 L5-S1.   ASSESSMENT & PLAN:  70 year old gentleman with past medical history of diabetes, hypertension, who presents with anemia and weakness.  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  -Unfortunately he has stage IV disease with diffuse liver metastasis, this is an incurable disease, with overall very poor prognosis. -Is currently on second line FOLFIRI now, tolerating well, dose decreased from cycle 4 due to neutropenia. -I reviewed his staging CT scan results from 05/14/2015 with patient and his wife, he has had a good partial response in the liver metastasis, no other new lesions. He is clinically doing well -his CEA Has been slowly trending up, but overall level is still low, we'll continue monitoring. -I discussed his restaging CT scan from 08/06/2015, which showed stable disease overall, no significant new lesions. -I discussed the role of genomic testing, such as [facility] one, to see if his tumor contains any actionable mutations. The cost and reimbursement issue was discussed with him, he understands there is a possibility high out-of-pocket cost for this test if it's not covered by his insurance. He agrees to proceed, I requested on 07/09/2015, but unfortunately the tumor tissue was not sufficient for the test. -he is tolerating chemo well, will continue   2. Microcytic anemia secondary to GI bleeding, iron deficiency and chemotherapy -His serum iron level and saturation were low, although ferritin is normal, he has some degree of iron deficient anemia, and anemia of malignancy  -Consider blood transfusion if hemoglobin less than 7.5 or symptomatic anemia -he received IV feraheme 510 mg twice in 09/2014, repeated iron study was normal on 07/23/2015 -He has restarted oral iron pill, and his anemia has improved.  3.  Hypertension and DM  -He will continue follow-up with his primary care physician. -We discussed that we will monitor his blood pressure and blood sugar closely and may need to adjust his medication during the chemotherapy  -His blood glucose has been low lately, he had a  few episodes of nocturnal hypoglycemia, I encouraged him to contact his primary care physician to adjust his insulin dose.  4. Peripheral neuropathy, secondary to chemotherapy, G1-2 -Overall improved. Stable lately. -Continue Neurontin  200 mg 3 times a day -We'll continue observation.  5. Diarrhea -Secondary to chemotherapy, no signs of infection. -He knows to take Imodium as needed after chemotherapy, and do not wait too long before takes it   Plan: -lab reviewed, adequate for treatment, Cycle 12 FOLFIRI tomorrow and continue every 2 weeks, Neulasta on day 3. His  Sister is coming to visit him before Easter , and he would like to postpone his  chemotherapy from April 18 to April 25 - hemoglobin 8, he is atraumatic, no blood transfusion today. Consider blood transfusion if hemoglobin less than 7.5 or symptomatic -He knows to use Imodium for diarrhea of chemotherapy - he will see APP Lattie Haw in 2 weeks, and I'll see him back in 5 weeks on 4/24   Truitt Merle  08/20/2015

## 2015-08-20 NOTE — Telephone Encounter (Signed)
Gave and printed appt sched and avs for pt for March thru May °

## 2015-08-21 ENCOUNTER — Encounter: Payer: Self-pay | Admitting: *Deleted

## 2015-08-21 ENCOUNTER — Ambulatory Visit (HOSPITAL_BASED_OUTPATIENT_CLINIC_OR_DEPARTMENT_OTHER): Payer: Medicare Other

## 2015-08-21 VITALS — BP 115/45 | HR 63 | Temp 97.0°F | Resp 16

## 2015-08-21 DIAGNOSIS — C179 Malignant neoplasm of small intestine, unspecified: Secondary | ICD-10-CM | POA: Diagnosis not present

## 2015-08-21 DIAGNOSIS — Z5111 Encounter for antineoplastic chemotherapy: Secondary | ICD-10-CM

## 2015-08-21 MED ORDER — SODIUM CHLORIDE 0.9 % IV SOLN
Freq: Once | INTRAVENOUS | Status: AC
  Administered 2015-08-21: 10:00:00 via INTRAVENOUS

## 2015-08-21 MED ORDER — ATROPINE SULFATE 1 MG/ML IJ SOLN
0.5000 mg | Freq: Once | INTRAMUSCULAR | Status: AC | PRN
  Administered 2015-08-21: 0.5 mg via INTRAVENOUS

## 2015-08-21 MED ORDER — ATROPINE SULFATE 1 MG/ML IJ SOLN
INTRAMUSCULAR | Status: AC
  Filled 2015-08-21: qty 1

## 2015-08-21 MED ORDER — SODIUM CHLORIDE 0.9 % IV SOLN
2000.0000 mg/m2 | INTRAVENOUS | Status: DC
  Administered 2015-08-21: 4250 mg via INTRAVENOUS
  Filled 2015-08-21: qty 85

## 2015-08-21 MED ORDER — SODIUM CHLORIDE 0.9 % IV SOLN
400.0000 mg/m2 | Freq: Once | INTRAVENOUS | Status: AC
  Administered 2015-08-21: 848 mg via INTRAVENOUS
  Filled 2015-08-21: qty 42.4

## 2015-08-21 MED ORDER — SODIUM CHLORIDE 0.9 % IV SOLN
Freq: Once | INTRAVENOUS | Status: AC
  Administered 2015-08-21: 10:00:00 via INTRAVENOUS
  Filled 2015-08-21: qty 8

## 2015-08-21 MED ORDER — SODIUM CHLORIDE 0.9 % IV SOLN
165.0000 mg/m2 | Freq: Once | INTRAVENOUS | Status: AC
  Administered 2015-08-21: 350 mg via INTRAVENOUS
  Filled 2015-08-21: qty 5.33

## 2015-08-21 NOTE — Patient Instructions (Signed)
[facility] Discharge Instructions for Patients Receiving Chemotherapy  Today you received the following chemotherapy agents:  5FU, Irinotecan, Leucovorin.  To help prevent nausea and vomiting after your treatment, we encourage you to take your nausea medication as prescribed.   If you develop nausea and vomiting that is not controlled by your nausea medication, call the clinic.   BELOW ARE SYMPTOMS THAT SHOULD BE REPORTED IMMEDIATELY:  *FEVER GREATER THAN 100.5 F  *CHILLS WITH OR WITHOUT FEVER  NAUSEA AND VOMITING THAT IS NOT CONTROLLED WITH YOUR NAUSEA MEDICATION  *UNUSUAL SHORTNESS OF BREATH  *UNUSUAL BRUISING OR BLEEDING  TENDERNESS IN MOUTH AND THROAT WITH OR WITHOUT PRESENCE OF ULCERS  *URINARY PROBLEMS  *BOWEL PROBLEMS  UNUSUAL RASH Items with * indicate a potential emergency and should be followed up as soon as possible.  Feel free to call the clinic you have any questions or concerns. The clinic phone number is [phone].  Please show the [facility] at check-in to the Emergency Department and triage nurse.

## 2015-08-21 NOTE — Progress Notes (Signed)
Oncology Nurse Navigator Documentation  Oncology Nurse Navigator Flowsheets 08/21/2015  Navigator Location [facility] On[facility]  Navigator Encounter Type Treatment  Patient Visit Type MedOnc  Treatment Phase Active Tx--FOLFIRI #12  Barriers/Navigation Needs Education  Education  Symptom Management--hypoglycemia episodes  Interventions Education Method  Education Method Verbal;Teach-back  Support Groups/Services -  Acuity Level 1  Time Spent with Patient 30  Time Spent with Patient (Retired) -  Encouraged patient to eat/drink a bedtime protein snack help prevent hypoglycemia episodes in am. Wife reported his blood sugar was in the 40's this morning. He was difficult to arouse and was confused until she got him some juice. Sees his PCP on April 10th to discuss his management. Tolerating tx well since the panitumumab was discontinued. Stays active and has been making the neck pillows for [facility] now and tells RN he can make up to 15/day.

## 2015-08-21 NOTE — Progress Notes (Signed)
Notified Dr. Burr Medico of H/H (7.9/25.8). Ok to treat. No new orders received.

## 2015-08-23 ENCOUNTER — Ambulatory Visit (HOSPITAL_BASED_OUTPATIENT_CLINIC_OR_DEPARTMENT_OTHER): Payer: Medicare Other

## 2015-08-23 ENCOUNTER — Ambulatory Visit: Payer: Medicare Other

## 2015-08-23 VITALS — BP 123/43 | HR 66 | Temp 97.6°F | Resp 20

## 2015-08-23 DIAGNOSIS — C772 Secondary and unspecified malignant neoplasm of intra-abdominal lymph nodes: Secondary | ICD-10-CM

## 2015-08-23 DIAGNOSIS — C787 Secondary malignant neoplasm of liver and intrahepatic bile duct: Secondary | ICD-10-CM | POA: Diagnosis not present

## 2015-08-23 DIAGNOSIS — C179 Malignant neoplasm of small intestine, unspecified: Secondary | ICD-10-CM | POA: Diagnosis not present

## 2015-08-23 MED ORDER — HEPARIN SOD (PORK) LOCK FLUSH 100 UNIT/ML IV SOLN
500.0000 [IU] | Freq: Once | INTRAVENOUS | Status: AC | PRN
  Administered 2015-08-23: 500 [IU]
  Filled 2015-08-23: qty 5

## 2015-08-23 MED ORDER — SODIUM CHLORIDE 0.9 % IJ SOLN
10.0000 mL | INTRAMUSCULAR | Status: DC | PRN
  Administered 2015-08-23: 10 mL
  Filled 2015-08-23: qty 10

## 2015-08-23 MED ORDER — PEGFILGRASTIM INJECTION 6 MG/0.6ML ~~LOC~~
6.0000 mg | PREFILLED_SYRINGE | Freq: Once | SUBCUTANEOUS | Status: AC
  Administered 2015-08-23: 6 mg via SUBCUTANEOUS
  Filled 2015-08-23: qty 0.6

## 2015-08-23 NOTE — Progress Notes (Signed)
Neulasta injection given by infusion nurse after home infusion pump disconnected 

## 2015-08-23 NOTE — Patient Instructions (Signed)
Pegfilgrastim injection What is this medicine? PEGFILGRASTIM (PEG fil gra stim) is a long-acting granulocyte colony-stimulating factor that stimulates the growth of neutrophils, a type of white blood cell important in the body's fight against infection. It is used to reduce the incidence of fever and infection in patients with certain types of cancer who are receiving chemotherapy that affects the bone marrow, and to increase survival after being exposed to high doses of radiation. This medicine may be used for other purposes; ask your health care provider or pharmacist if you have questions. What should I tell my health care provider before I take this medicine? They need to know if you have any of these conditions: -kidney disease -latex allergy -ongoing radiation therapy -sickle cell disease -skin reactions to acrylic adhesives (On-Body Injector only) -an unusual or allergic reaction to pegfilgrastim, filgrastim, other medicines, foods, dyes, or preservatives -pregnant or trying to get pregnant -breast-feeding How should I use this medicine? This medicine is for injection under the skin. If you get this medicine at home, you will be taught how to prepare and give the pre-filled syringe or how to use the On-body Injector. Refer to the patient Instructions for Use for detailed instructions. Use exactly as directed. Take your medicine at regular intervals. Do not take your medicine more often than directed. It is important that you put your used needles and syringes in a special sharps container. Do not put them in a trash can. If you do not have a sharps container, call your pharmacist or healthcare provider to get one. Talk to your pediatrician regarding the use of this medicine in children. While this drug may be prescribed for selected conditions, precautions do apply. Overdosage: If you think you have taken too much of this medicine contact a poison control center or emergency room at  once. NOTE: This medicine is only for you. Do not share this medicine with others. What if I miss a dose? It is important not to miss your dose. Call your doctor or health care professional if you miss your dose. If you miss a dose due to an On-body Injector failure or leakage, a new dose should be administered as soon as possible using a single prefilled syringe for manual use. What may interact with this medicine? Interactions have not been studied. Give your health care provider a list of all the medicines, herbs, non-prescription drugs, or dietary supplements you use. Also tell them if you smoke, drink alcohol, or use illegal drugs. Some items may interact with your medicine. This list may not describe all possible interactions. Give your health care provider a list of all the medicines, herbs, non-prescription drugs, or dietary supplements you use. Also tell them if you smoke, drink alcohol, or use illegal drugs. Some items may interact with your medicine. What should I watch for while using this medicine? You may need blood work done while you are taking this medicine. If you are going to need a MRI, CT scan, or other procedure, tell your doctor that you are using this medicine (On-Body Injector only). What side effects may I notice from receiving this medicine? Side effects that you should report to your doctor or health care professional as soon as possible: -allergic reactions like skin rash, itching or hives, swelling of the face, lips, or tongue -dizziness -fever -pain, redness, or irritation at site where injected -pinpoint red spots on the skin -red or dark-brown urine -shortness of breath or breathing problems -stomach or side pain, or pain   at the shoulder -swelling -tiredness -trouble passing urine or change in the amount of urine Side effects that usually do not require medical attention (report to your doctor or health care professional if they continue or are  bothersome): -bone pain -muscle pain This list may not describe all possible side effects. Call your doctor for medical advice about side effects. You may report side effects to FDA at 1-800-FDA-1088. Where should I keep my medicine? Keep out of the reach of children. Store pre-filled syringes in a refrigerator between 2 and 8 degrees C (36 and 46 degrees F). Do not freeze. Keep in carton to protect from light. Throw away this medicine if it is left out of the refrigerator for more than 48 hours. Throw away any unused medicine after the expiration date. NOTE: This sheet is a summary. It may not cover all possible information. If you have questions about this medicine, talk to your doctor, pharmacist, or health care provider.    2016, Elsevier/Gold Standard. (2014-06-08 14:30:14)  

## 2015-09-03 ENCOUNTER — Ambulatory Visit (HOSPITAL_BASED_OUTPATIENT_CLINIC_OR_DEPARTMENT_OTHER): Payer: Medicare Other | Admitting: Nurse Practitioner

## 2015-09-03 ENCOUNTER — Other Ambulatory Visit (HOSPITAL_BASED_OUTPATIENT_CLINIC_OR_DEPARTMENT_OTHER): Payer: Medicare Other

## 2015-09-03 VITALS — BP 109/48 | HR 68 | Temp 98.2°F | Resp 17 | Ht 66.0 in | Wt 186.7 lb

## 2015-09-03 DIAGNOSIS — C179 Malignant neoplasm of small intestine, unspecified: Secondary | ICD-10-CM

## 2015-09-03 DIAGNOSIS — D649 Anemia, unspecified: Secondary | ICD-10-CM

## 2015-09-03 DIAGNOSIS — N183 Chronic kidney disease, stage 3 (moderate): Secondary | ICD-10-CM

## 2015-09-03 DIAGNOSIS — E119 Type 2 diabetes mellitus without complications: Secondary | ICD-10-CM

## 2015-09-03 DIAGNOSIS — C787 Secondary malignant neoplasm of liver and intrahepatic bile duct: Secondary | ICD-10-CM

## 2015-09-03 DIAGNOSIS — I10 Essential (primary) hypertension: Secondary | ICD-10-CM

## 2015-09-03 LAB — CBC WITH DIFFERENTIAL/PLATELET
BASO%: 0.4 % (ref 0.0–2.0)
BASOS ABS: 0 10*3/uL (ref 0.0–0.1)
EOS ABS: 0 10*3/uL (ref 0.0–0.5)
EOS%: 0.5 % (ref 0.0–7.0)
HCT: 25 % — ABNORMAL LOW (ref 38.4–49.9)
HGB: 7.7 g/dL — ABNORMAL LOW (ref 13.0–17.1)
LYMPH%: 22.1 % (ref 14.0–49.0)
MCH: 29.5 pg (ref 27.2–33.4)
MCHC: 30.8 g/dL — AB (ref 32.0–36.0)
MCV: 95.6 fL (ref 79.3–98.0)
MONO#: 1 10*3/uL — ABNORMAL HIGH (ref 0.1–0.9)
MONO%: 12.7 % (ref 0.0–14.0)
NEUT#: 5 10*3/uL (ref 1.5–6.5)
NEUT%: 64.3 % (ref 39.0–75.0)
Platelets: 155 10*3/uL (ref 140–400)
RBC: 2.62 10*6/uL — ABNORMAL LOW (ref 4.20–5.82)
RDW: 21.6 % — ABNORMAL HIGH (ref 11.0–14.6)
WBC: 7.7 10*3/uL (ref 4.0–10.3)
lymph#: 1.7 10*3/uL (ref 0.9–3.3)

## 2015-09-03 LAB — COMPREHENSIVE METABOLIC PANEL
ALBUMIN: 3 g/dL — AB (ref 3.5–5.0)
ALK PHOS: 108 U/L (ref 40–150)
ALT: 16 U/L (ref 0–55)
AST: 22 U/L (ref 5–34)
Anion Gap: 7 mEq/L (ref 3–11)
BUN: 9.3 mg/dL (ref 7.0–26.0)
CHLORIDE: 109 meq/L (ref 98–109)
CO2: 27 mEq/L (ref 22–29)
Calcium: 8.7 mg/dL (ref 8.4–10.4)
Creatinine: 1.1 mg/dL (ref 0.7–1.3)
EGFR: 70 mL/min/{1.73_m2} — AB (ref >90)
GLUCOSE: 70 mg/dL (ref 70–140)
POTASSIUM: 3.9 meq/L (ref 3.5–5.1)
SODIUM: 143 meq/L (ref 136–145)
Total Bilirubin: <0.3 mg/dL (ref 0.20–1.20)
Total Protein: 6.1 g/dL — ABNORMAL LOW (ref 6.4–8.3)

## 2015-09-03 NOTE — Progress Notes (Signed)
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    08/18/2014 Miscellaneous tumor KRAS mutation (-)   08/18/2014 Pathology Results Liver biopsy showed metastatic adenocarcinoma, consistent with GI primary   08/18/2014 Initial Diagnosis Small bowel cancer   08/18/2014 Procedure small bowel enteroscopy with biopsy by Dr. Benson Norway showed at the proximal jejunum there was evidence of abnormal mucosa and friability, biopsy was obtained.    09/05/2014 Imaging PET hypermetabolic mass involving a small bowel loop with adjacent mesenteric lymphadenopathy, and diffuse liver metastasis and mild hypermetabolic lymphadenopathy in porta hepatis.   09/06/2014 - 02/01/2015 Chemotherapy mFOLFOX, stopped due to his neuropathy, and earlier mild disease progression on PET    09/07/2014 - 09/11/2014 Hospital Admission He was admitted for fever and GI bleeding. Received 3 units of RBC.   10/26/2014 Imaging Interval significant improvement in the primary small bowel mass, adjacent lymphadenopathy and extensive hepatic metastatic disease. No other new lesions.    02/09/2015 Imaging PET/CT scan showed stable primary malignancy in the proximal jejunum, mild metabolic progression of the 3 residual metabolic liver metastasis. CT Portion showed decreased size of his liver metastasis.   02/27/2015 -  Chemotherapy second line chemo FOLFIRI, every 2 weeks, and panitumumab (held for second cycle due to severe skin rashes)    08/06/2015 Imaging Stable number and size of the liver lesions. The left lower lobe nodule is considerably less  prominent. No other new lesions.        INTERVAL HISTORY:   Tyler Evans returns as scheduled. He completed cycle 12 FOLFIRI 08/21/2015. He denies nausea/vomiting. No mouth sores. No significant diarrhea. He denies pain. He describes his appetite as "decent". He notes that he tires easily. No shortness of breath or chest pain. He occasionally notes a small amount of blood in his urine. He does not feel he needs a blood transfusion.   Objective:  Vital signs in last 24 hours:  Blood pressure 109/48, pulse 68, temperature 98.2 F (36.8 C), temperature source Oral, resp. rate 17, height _0  (1.676 m), weight 186 lb 11.2 oz (84.687 kg), SpO2 100 %.    HEENT: No thrush or ulcerations. Resp: Lungs clear bilaterally. Cardio: Regular rate and rhythm. GI: Abdomen soft and nontender. No hepatomegaly. Vascular: No leg edema. Calves soft and nontender. Skin: Palms without erythema. Some dryness.  Port-A-Cath without erythema.  Lab Results:  Lab Results  Component Value Date   WBC 7.7 09/03/2015   HGB 7.7* 09/03/2015   HCT 25.0* 09/03/2015   MCV 95.6 09/03/2015   PLT 155 09/03/2015   NEUTROABS 5.0 09/03/2015    Imaging:  No results found.  Medications: I have reviewed the patient's current medications.  Assessment/Plan: 1. 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  CT abdomen/pelvis without contrast 08/17/2014 with multiple large masses within the liver, bulky in appearance. The largest measured approximately 5.7 cm. Focal filling defect within the proximal jejunum measuring approximately 2.5 x 1.7 cm. Mild adjacent jejunal wall thickening.  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  Cycle 1 FOLFOX 09/06/2014  Cycle 2 FOLFOX 09/20/2014  Cycle 3 FOLFOX 10/04/2014  Cycle 4 FOLFOX  10/18/2014  Restaging PET scan 10/26/2014 with interval significant improvement in the primary small bowel mass, adjacent lymphadenopathy and extensive hepatic metastatic disease. Small pulmonary nodules bilaterally unchanged. No evidence of disease progression.  Cycle 5 FOLFOX 11/08/2014  Cycle 6 FOLFOX 11/29/2014  Cycle 7 FOLFOX 12/18/2014  Cycle 8 FOLFOX 01/04/2015 (oxaliplatin dose reduced due to thrombocytopenia)  Cycle 9 FOLFOX 01/18/2015  Cycle 10 FOLFOX 02/01/2015 (oxaliplatin dose reduced due to neuropathy)  PET/CT scan 02/19/2015 with stable primary malignancy in the proximal jejunum, mild metabolic progression of 3 residual metabolic liver metastasis. CT portion showed decreased size of liver metastasis.  Cycle 1 FOLFIRI 02/27/2015  Cycle 2 FOLFIRI 03/13/2015  Cycle 3 FOLFIRI 04/10/2015  Cycle 4 FOLFIRI 04/23/2015  Restaging CT scans 05/14/2015 with interval improvement in appearance of multifocal liver metastasis. Several small nonspecific pulmonary nodules again noted. Nodule in the left lower lobe minimally increased.  Cycle 5 FOLFIRI 05/15/2015  Cycle 6 FOLFIRI 05/29/2015  Cycle 7 FOLFIRI 06/12/2015  Cycle 8 FOLFIRI 06/26/2015   Cycle 9 FOLFIRI 07/10/2015   Cycle 10 FOLFIRI 07/24/2015   Restaging CT evaluation 08/06/2015 with stable number and size of liver lesions. Left lower lobe nodule considerably less prominent. No new lesions.  Cycle 11 FOLFIRI 08/07/2015   Cycle 12 FOLFIRI 08/21/2015   Cycle 13 FOLFIRI 09/03/2015 2. Microcytic anemia in the setting of heme-positive stool status post red cell transfusion support 08/17/2014. 3. Anorexia/weight loss secondary to #1. 4. Hypertension. 5. Diabetes. 6. CKD stage III 7. Hospitalization 09/07/2014 through 09/11/2014 with fever/sepsis, culture negative. 8. Thrombocytopenia secondary to chemotherapy.   Disposition: Tyler Evans appears stable. He has completed 12 cycles of FOLFIRI. Plan to proceed  with cycle 13 as scheduled on 09/04/2015.  He continues to be anemic. He is asymptomatic. He does not feel he needs a blood transfusion. Signs and symptoms suggestive of progressive anemia reviewed at today's visit.  He will return for a follow-up visit and cycle 14 FOLFIRI on 09/24/2015. He will contact the office in the interim with any problems.    Ned Card ANP/GNP-BC   09/03/2015  10:08 AM

## 2015-09-04 ENCOUNTER — Ambulatory Visit (HOSPITAL_BASED_OUTPATIENT_CLINIC_OR_DEPARTMENT_OTHER): Payer: Medicare Other

## 2015-09-04 VITALS — BP 116/49 | HR 71 | Temp 97.3°F | Resp 18

## 2015-09-04 DIAGNOSIS — C179 Malignant neoplasm of small intestine, unspecified: Secondary | ICD-10-CM | POA: Diagnosis not present

## 2015-09-04 DIAGNOSIS — Z5111 Encounter for antineoplastic chemotherapy: Secondary | ICD-10-CM

## 2015-09-04 MED ORDER — SODIUM CHLORIDE 0.9 % IV SOLN
165.0000 mg/m2 | Freq: Once | INTRAVENOUS | Status: AC
  Administered 2015-09-04: 350 mg via INTRAVENOUS
  Filled 2015-09-04: qty 5.83

## 2015-09-04 MED ORDER — SODIUM CHLORIDE 0.9 % IV SOLN
Freq: Once | INTRAVENOUS | Status: AC
  Administered 2015-09-04: 09:00:00 via INTRAVENOUS
  Filled 2015-09-04: qty 8

## 2015-09-04 MED ORDER — SODIUM CHLORIDE 0.9 % IV SOLN
400.0000 mg/m2 | Freq: Once | INTRAVENOUS | Status: AC
  Administered 2015-09-04: 848 mg via INTRAVENOUS
  Filled 2015-09-04: qty 42.4

## 2015-09-04 MED ORDER — FLUOROURACIL CHEMO INJECTION 5 GM/100ML
2000.0000 mg/m2 | INTRAVENOUS | Status: DC
  Administered 2015-09-04: 4250 mg via INTRAVENOUS
  Filled 2015-09-04: qty 85

## 2015-09-04 MED ORDER — ATROPINE SULFATE 1 MG/ML IJ SOLN
INTRAMUSCULAR | Status: AC
  Filled 2015-09-04: qty 1

## 2015-09-04 MED ORDER — ATROPINE SULFATE 1 MG/ML IJ SOLN
0.5000 mg | Freq: Once | INTRAMUSCULAR | Status: AC | PRN
  Administered 2015-09-04: 0.5 mg via INTRAVENOUS

## 2015-09-04 MED ORDER — SODIUM CHLORIDE 0.9 % IV SOLN
Freq: Once | INTRAVENOUS | Status: AC
  Administered 2015-09-04: 08:00:00 via INTRAVENOUS

## 2015-09-04 NOTE — Patient Instructions (Signed)
[facility] Discharge Instructions for Patients Receiving Chemotherapy  Today you received the following chemotherapy agents Irinotecan/Leucovorin/Fluorouracil.  To help prevent nausea and vomiting after your treatment, we encourage you to take your nausea medication as directed.   If you develop nausea and vomiting that is not controlled by your nausea medication, call the clinic.   BELOW ARE SYMPTOMS THAT SHOULD BE REPORTED IMMEDIATELY:  *FEVER GREATER THAN 100.5 F  *CHILLS WITH OR WITHOUT FEVER  NAUSEA AND VOMITING THAT IS NOT CONTROLLED WITH YOUR NAUSEA MEDICATION  *UNUSUAL SHORTNESS OF BREATH  *UNUSUAL BRUISING OR BLEEDING  TENDERNESS IN MOUTH AND THROAT WITH OR WITHOUT PRESENCE OF ULCERS  *URINARY PROBLEMS  *BOWEL PROBLEMS  UNUSUAL RASH Items with * indicate a potential emergency and should be followed up as soon as possible.  Feel free to call the clinic you have any questions or concerns. The clinic phone number is [phone].  Please show the CHEMO ALERT CARD at check-in to the Emergency Department and triage nurse.    

## 2015-09-04 NOTE — Progress Notes (Signed)
OK to treat with HGB 7.7 per 4/3 office note by Ned Card, NP.

## 2015-09-05 ENCOUNTER — Ambulatory Visit: Payer: Medicare Other

## 2015-09-06 ENCOUNTER — Ambulatory Visit (HOSPITAL_BASED_OUTPATIENT_CLINIC_OR_DEPARTMENT_OTHER): Payer: Medicare Other

## 2015-09-06 ENCOUNTER — Ambulatory Visit: Payer: Medicare Other

## 2015-09-06 VITALS — BP 107/52 | HR 62 | Temp 98.2°F | Resp 18

## 2015-09-06 DIAGNOSIS — Z5189 Encounter for other specified aftercare: Secondary | ICD-10-CM

## 2015-09-06 DIAGNOSIS — C179 Malignant neoplasm of small intestine, unspecified: Secondary | ICD-10-CM

## 2015-09-06 MED ORDER — SODIUM CHLORIDE 0.9 % IJ SOLN
10.0000 mL | INTRAMUSCULAR | Status: DC | PRN
  Administered 2015-09-06: 10 mL
  Filled 2015-09-06: qty 10

## 2015-09-06 MED ORDER — HEPARIN SOD (PORK) LOCK FLUSH 100 UNIT/ML IV SOLN
500.0000 [IU] | Freq: Once | INTRAVENOUS | Status: AC | PRN
  Administered 2015-09-06: 500 [IU]
  Filled 2015-09-06: qty 5

## 2015-09-06 MED ORDER — PEGFILGRASTIM INJECTION 6 MG/0.6ML ~~LOC~~
6.0000 mg | PREFILLED_SYRINGE | Freq: Once | SUBCUTANEOUS | Status: AC
  Administered 2015-09-06: 6 mg via SUBCUTANEOUS
  Filled 2015-09-06: qty 0.6

## 2015-09-06 NOTE — Patient Instructions (Signed)
[facility] Discharge Instructions for Patients Receiving Chemotherapy  Today you completed the following chemotherapy agents 5FU To help prevent nausea and vomiting after your treatment, we encourage you to take your nausea medication as prescribedIf you develop nausea and vomiting that is not controlled by your nausea medication, call the clinic.   BELOW ARE SYMPTOMS THAT SHOULD BE REPORTED IMMEDIATELY:  *FEVER GREATER THAN 100.5 F  *CHILLS WITH OR WITHOUT FEVER  NAUSEA AND VOMITING THAT IS NOT CONTROLLED WITH YOUR NAUSEA MEDICATION  *UNUSUAL SHORTNESS OF BREATH  *UNUSUAL BRUISING OR BLEEDING  TENDERNESS IN MOUTH AND THROAT WITH OR WITHOUT PRESENCE OF ULCERS  *URINARY PROBLEMS  *BOWEL PROBLEMS  UNUSUAL RASH Items with * indicate a potential emergency and should be followed up as soon as possible.  Feel free to call the clinic you have any questions or concerns. The clinic phone number is [phone].  Please show the [place] at check-in to the Emergency Department and triage nurse.

## 2015-09-06 NOTE — Progress Notes (Signed)
Neulasta injection given by infusion nurse after home infusion pump disconnected 

## 2015-09-06 NOTE — Progress Notes (Signed)
Pt refused AVS.

## 2015-09-24 ENCOUNTER — Telehealth: Payer: Self-pay | Admitting: Hematology

## 2015-09-24 ENCOUNTER — Encounter: Payer: Self-pay | Admitting: Hematology

## 2015-09-24 ENCOUNTER — Ambulatory Visit (HOSPITAL_BASED_OUTPATIENT_CLINIC_OR_DEPARTMENT_OTHER): Payer: Medicare Other | Admitting: Hematology

## 2015-09-24 ENCOUNTER — Other Ambulatory Visit (HOSPITAL_BASED_OUTPATIENT_CLINIC_OR_DEPARTMENT_OTHER): Payer: Medicare Other

## 2015-09-24 VITALS — BP 127/45 | HR 70 | Temp 98.4°F | Resp 18 | Ht 66.0 in | Wt 191.5 lb

## 2015-09-24 DIAGNOSIS — C787 Secondary malignant neoplasm of liver and intrahepatic bile duct: Secondary | ICD-10-CM

## 2015-09-24 DIAGNOSIS — T451X5A Adverse effect of antineoplastic and immunosuppressive drugs, initial encounter: Secondary | ICD-10-CM

## 2015-09-24 DIAGNOSIS — C772 Secondary and unspecified malignant neoplasm of intra-abdominal lymph nodes: Secondary | ICD-10-CM

## 2015-09-24 DIAGNOSIS — D509 Iron deficiency anemia, unspecified: Secondary | ICD-10-CM

## 2015-09-24 DIAGNOSIS — C179 Malignant neoplasm of small intestine, unspecified: Secondary | ICD-10-CM | POA: Diagnosis not present

## 2015-09-24 DIAGNOSIS — G620 Drug-induced polyneuropathy: Secondary | ICD-10-CM

## 2015-09-24 DIAGNOSIS — D630 Anemia in neoplastic disease: Secondary | ICD-10-CM

## 2015-09-24 DIAGNOSIS — E119 Type 2 diabetes mellitus without complications: Secondary | ICD-10-CM

## 2015-09-24 DIAGNOSIS — D6481 Anemia due to antineoplastic chemotherapy: Secondary | ICD-10-CM

## 2015-09-24 DIAGNOSIS — G622 Polyneuropathy due to other toxic agents: Secondary | ICD-10-CM

## 2015-09-24 DIAGNOSIS — D500 Iron deficiency anemia secondary to blood loss (chronic): Secondary | ICD-10-CM

## 2015-09-24 DIAGNOSIS — I10 Essential (primary) hypertension: Secondary | ICD-10-CM

## 2015-09-24 LAB — COMPREHENSIVE METABOLIC PANEL
ALBUMIN: 2.9 g/dL — AB (ref 3.5–5.0)
ALK PHOS: 106 U/L (ref 40–150)
ALT: 30 U/L (ref 0–55)
ANION GAP: 8 meq/L (ref 3–11)
AST: 44 U/L — AB (ref 5–34)
BUN: 13.6 mg/dL (ref 7.0–26.0)
CALCIUM: 8.7 mg/dL (ref 8.4–10.4)
CO2: 24 mEq/L (ref 22–29)
Chloride: 108 mEq/L (ref 98–109)
Creatinine: 1.1 mg/dL (ref 0.7–1.3)
EGFR: 68 mL/min/{1.73_m2} — ABNORMAL LOW (ref >90)
Glucose: 172 mg/dl — ABNORMAL HIGH (ref 70–140)
POTASSIUM: 4 meq/L (ref 3.5–5.1)
Sodium: 140 mEq/L (ref 136–145)
Total Bilirubin: 0.32 mg/dL (ref 0.20–1.20)
Total Protein: 5.8 g/dL — ABNORMAL LOW (ref 6.4–8.3)

## 2015-09-24 LAB — CBC WITH DIFFERENTIAL/PLATELET
BASO%: 0.2 % (ref 0.0–2.0)
BASOS ABS: 0 10*3/uL (ref 0.0–0.1)
EOS ABS: 0.1 10*3/uL (ref 0.0–0.5)
EOS%: 1.5 % (ref 0.0–7.0)
HEMATOCRIT: 25.8 % — AB (ref 38.4–49.9)
HEMOGLOBIN: 7.9 g/dL — AB (ref 13.0–17.1)
LYMPH#: 1 10*3/uL (ref 0.9–3.3)
LYMPH%: 13.8 % — ABNORMAL LOW (ref 14.0–49.0)
MCH: 29.8 pg (ref 27.2–33.4)
MCHC: 30.7 g/dL — ABNORMAL LOW (ref 32.0–36.0)
MCV: 97.2 fL (ref 79.3–98.0)
MONO#: 1.1 10*3/uL — AB (ref 0.1–0.9)
MONO%: 15.5 % — ABNORMAL HIGH (ref 0.0–14.0)
NEUT#: 5 10*3/uL (ref 1.5–6.5)
NEUT%: 69 % (ref 39.0–75.0)
PLATELETS: 176 10*3/uL (ref 140–400)
RBC: 2.65 10*6/uL — ABNORMAL LOW (ref 4.20–5.82)
RDW: 21 % — AB (ref 11.0–14.6)
WBC: 7.2 10*3/uL (ref 4.0–10.3)

## 2015-09-24 NOTE — Progress Notes (Signed)
Pt saw Dr. Burr Medico today.  OK to treat on 09/25/15 with all lab results including AST  44 , and  Hgb  7.9  As per Dr. Burr Medico.

## 2015-09-24 NOTE — Progress Notes (Signed)
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    08/18/2014 Miscellaneous tumor KRAS mutation (-)   08/18/2014 Pathology Results Liver biopsy showed metastatic adenocarcinoma, consistent with GI primary   08/18/2014 Initial Diagnosis Small bowel cancer   08/18/2014 Procedure small bowel enteroscopy with biopsy by Dr. Benson Norway showed at the proximal jejunum there was evidence of abnormal mucosa and friability, biopsy was obtained.    09/05/2014 Imaging PET hypermetabolic mass involving a small bowel loop with adjacent mesenteric lymphadenopathy, and diffuse liver metastasis and mild hypermetabolic lymphadenopathy in porta hepatis.   09/06/2014 - 02/01/2015 Chemotherapy mFOLFOX, stopped due to his neuropathy, and earlier mild disease progression on PET    09/07/2014 - 09/11/2014 Hospital Admission He was admitted for fever and GI bleeding. Received 3 units of RBC.   10/26/2014 Imaging Interval significant improvement in the primary small bowel mass, adjacent lymphadenopathy and extensive hepatic metastatic disease. No other new lesions.    02/09/2015 Imaging PET/CT scan showed stable primary malignancy in the proximal jejunum, mild metabolic progression of the 3 residual metabolic liver metastasis. CT  Portion showed decreased size of his liver metastasis.   02/27/2015 -  Chemotherapy second line chemo FOLFIRI, every 2 weeks, and panitumumab (held for second cycle due to severe skin rashes)    08/06/2015 Imaging Stable number and size of the liver lesions. The left lower lobe nodule is considerably less prominent. No other new lesions.     HISTORY OF PRESENTING ILLNESS:  Tyler Evans 70 y.o. male is here because of a recent diagnosis of small bowel cancer. He presented to his PCP on 08/17/2014 with complaints of feeling weak and a one month history of a cough. He was found to be anemic and was referred to the emergency department. Hemoglobin was found to be 7.6, MCV 76.6; creatinine was elevated at 1.74. Stool was Hemoccult positive.  CT abdomen/pelvis without contrast on 08/17/2014 showed multiple large masses within the liver, bulky in appearance. The largest measured approximately 5.7 cm. There appeared to be a focal filling defect within the proximal jejunum measuring approximately 2.5 x 1.7 cm. There was mild adjacent jejunal wall thickening. The lung bases were clear.  On 08/18/2014 he underwent a small bowel enteroscopy with biopsy by Dr. Benson Norway. The esophagus and gastric lumen were normal. At the proximal jejunum there was evidence of abnormal mucosa and friability. The pediatric colonoscope was not able to traverse the area. Biopsies were obtained. An ultraslim colonoscope was then utilized. This colonoscope was able to traverse the area of stenosis which measured approximately 3 cm in length. 50% of the lumen was ulcerated. 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 MMR stains are pending.  He was discharged home on 08/19/2014.  CURRENT THERAPY: FOLFIRI with 5FU '2400mg'$ /m2 (decresed to '2000mg'$ /m2 from cycle 4) over 46 hours and irinotecan '180mg'$ /m2 (decreased to '165mg'$ /m2 from cycle 4), every 2 weeks.   INTERIM HISTORY:  Tyler Evans returns for  follow-up. He is clinically doing well overall. He has mild fatigue, able tolerate her routine activities, in slow pace. He denies any chest pain, or dyspnea. He tolerates chemotherapy well, has mild  numbness and tingling on his fingers and toes after chemotherapy, and tingling resolves afterwards. He lost some weight 2-3 weeks ago, but again the back lately. He has lost a total of about 50 pounds since he was diagnosed with cancer. His blood glucose overall has been better controlled, no more frequent episodes of hypoglycemia, since his insulin was adjusted.  MEDICAL HISTORY:  Past Medical History  Diagnosis Date  . Hypertension   . Small bowel cancer ([place]) 08/18/2014  . Diabetes mellitus without complication ([place])     SURGICAL HISTORY: Past Surgical History  Procedure Laterality Date  . Enteroscopy N/A 08/18/2014    Procedure: ENTEROSCOPY;  Surgeon: Carol Ada, MD;  Location: [facility] ENDOSCOPY;  Service: Endoscopy;  Laterality: N/A;    SOCIAL HISTORY: History   Social History  . Marital Status: Married    Spouse Name: N/A  . Number of Children: 2  . Years of Education: N/A   Occupational History  .  retired Ambulance person    Social History Main Topics  . Smoking status: Never Smoker   . Smokeless tobacco: Not on file  . Alcohol Use: No  . Drug Use: Not on file  . Sexual Activity: Not on file   Other Topics Concern  . Not on file   Social History Narrative  He lives in [place]. He is married. He has 2 children both reported to be in good health. No tobacco or alcohol use. He is a retired Ambulance person.  FAMILY HISTORY: Family History  Problem Relation Age of Onset  . Heart failure, Alzheimer's  Mother   . Lung cancer Father   . Kidney cancer Brother     ALLERGIES:  is allergic to penicillins.  MEDICATIONS:  Current Outpatient Prescriptions on File Prior to Visit  Medication Sig Dispense Refill  . atorvastatin (LIPITOR) 20 MG tablet Take 20 mg by mouth daily.    . Blood Glucose Monitoring Suppl (ONE TOUCH ULTRA MINI) W/DEVICE KIT     . clindamycin (CLINDAGEL) 1 % gel Apply topically 2 (two) times daily. 30 g 2  . gabapentin (NEURONTIN) 100 MG capsule Take  2 capsules (200 mg total) by mouth 3 (three) times daily. 150 capsule 2  . hydrocortisone 2.5 % cream Apply topically 2 (two) times daily. 30 g 2  . LANTUS SOLOSTAR 100 UNIT/ML Solostar Pen     . lidocaine-prilocaine (EMLA) cream APPLY TO AFFECTED AREA ONCE  3  . ONE TOUCH ULTRA TEST test strip     . PRESCRIPTION MEDICATION See admin instructions. Receives Vectibix, Leucovorin, and Adrucil chemotherapy every 2 weeks.    . tamsulosin (FLOMAX) 0.4 MG CAPS capsule Take 0.4 mg by mouth daily.  12   No current facility-administered medications on file prior to visit.  ;  REVIEW OF SYSTEMS:   Constitutional: Denies fevers, chills or abnormal night sweats; mild fatigue, weight is stable.  Eyes: Denies blurriness of vision, double vision or watery eyes Ears, nose, mouth, throat, and face: Denies mucositis or sore throat Respiratory: One month history of a nonproductive cough;  no shortness of breath Cardiovascular: Denies palpitation, chest discomfort or lower extremity swelling Gastrointestinal:  Denies nausea, heartburn. No dysphagia. He has noted less frequent bowel movements over the past 2 weeks. Does not characterize this as constipation. Skin: Denies abnormal skin  rashes Lymphatics: Denies new lymphadenopathy or easy bruising Neurological: Denies numbness, tingling. No extremity weakness Behavioral/Psych: Mood is stable, no new changes  All other systems were reviewed with the patient and are negative.  PHYSICAL EXAMINATION: ECOG PERFORMANCE STATUS: 1  Filed Vitals:   09/24/15 0828  BP: 127/45  Pulse: 70  Temp: 98.4 F (36.9 C)  Resp: 18   Filed Weights   09/24/15 0828  Weight: 191 lb 8 oz (86.864 kg)    GENERAL:alert, no distress and comfortable SKIN: skin color, texture, turgor are normal, (+) skin pigmentation from his rash on his face, neck, chest and upper abdomen.  EYES: normal, conjunctiva are pink and non-injected, sclera clear OROPHARYNX:no exudate, no erythema and  lips, buccal mucosa, and tongue normal  NECK: supple, thyroid normal size, non-tender, without nodularity LYMPH:  no palpable lymphadenopathy in the cervical, axillary or inguinal regions LUNGS: clear to auscultation and percussion with normal breathing effort HEART: regular rate & rhythm and no murmurs and no lower extremity edema ABDOMEN:abdomen soft, mild tenderness at the right upper quadrant , no rebound pain, normal bowel sounds Musculoskeletal:no cyanosis of digits and no clubbing  PSYCH: alert & oriented x 3 with fluent speech NEURO: no focal motor deficits, his light touch sensation and vibration sensation are diminished on his hands and feet.   LABORATORY DATA:  I have reviewed the data as listed CBC Latest Ref Rng 09/24/2015 09/03/2015 08/20/2015  WBC 4.0 - 10.3 10e3/uL 7.2 7.7 8.2  Hemoglobin 13.0 - 17.1 g/dL 7.9(L) 7.7(L) 7.9(L)  Hematocrit 38.4 - 49.9 % 25.8(L) 25.0(L) 25.8(L)  Platelets 140 - 400 10e3/uL 176 155 130(L)    CMP Latest Ref Rng 09/24/2015 09/03/2015 08/20/2015  Glucose 70 - 140 mg/dl 172(H) 70 89  BUN 7.0 - 26.0 mg/dL 13.6 9.3 12.2  Creatinine 0.7 - 1.3 mg/dL 1.1 1.1 1.1  Sodium 136 - 145 mEq/L 140 143 143  Potassium 3.5 - 5.1 mEq/L 4.0 3.9 3.4(L)  CO2 22 - 29 mEq/L '24 27 28  '$ Calcium 8.4 - 10.4 mg/dL 8.7 8.7 8.4  Total Protein 6.4 - 8.3 g/dL 5.8(L) 6.1(L) 6.0(L)  Total Bilirubin 0.20 - 1.20 mg/dL 0.32 <0.30 <0.30  Alkaline Phos 40 - 150 U/L 106 108 115  AST 5 - 34 U/L 44(H) 22 22  ALT 0 - 55 U/L '30 16 18   '$ Results for Tyler Evans, Tyler Evans (MRN [id number]) as of 08/07/2015 07:22  Ref. Range 07/23/2015 07:59  Iron Latest Ref Range: 42-163 ug/dL 59  UIBC Latest Ref Range: 117-376 ug/dL 209  TIBC Latest Ref Range: 202-409 ug/dL 269  %SAT Latest Ref Range: 20-55 % 22  Ferritin Latest Ref Range: 22-316 ng/ml 451 (H)   CEA  Status: Finalresult Visible to patient:  MyChart Nextappt: 09/25/2015 at 08:00 AM in Oncology [facility] D11) Dx:  Small bowel  cancer ([place])              Ref Range 71moago  222mogo  77m37moo     CEA 0.0 - 4.7 ng/mL 50.5 (H) 41.0 (H)CM 33.6 (H)CM          Pathology report:  Small Intestine Biopsy, jejunal mass 08/18/2014 - INVASIVE ADENOCARCINOMA IN A BACKGROUND OF TUBULOVILLOUS ADENOMA. ADDITIONAL INFORMATION: Mismatch Repair (MMR) Protein Immunohistochemistry (IHC) IHC Expression Result (LIMITED TUMOR): MLH1: Preserved nuclear expression (greater 50% tumor expression) MSH2: Preserved nuclear expression (greater 50% tumor expression) MSH6: Preserved nuclear expression (greater 50% tumor expression) PMS2: Preserved nuclear expression (greater 50% tumor expression) * Internal  control demonstrates intact nuclear expression Interpretation: NORMAL There is preserved expression  Diagnosis 08/28/2014  Liver, needle/core biopsy, right lobe - METASTATIC ADENOCARCINOMA   RADIOGRAPHIC STUDIES: I have personally reviewed the radiological images as listed and agreed with the findings in the report.   CT chest, abdomen and pelvis with contrast 08/06/2015 IMPRESSION: 1. Stable number and size of the liver lesions. The previous small bowel tumor in the vicinity of the ligament of Treitz is not well seen. 2. The left lower lobe nodule of concern for enlargement on the prior exam is actually considerably less prominent on today' s exam, with only faint ground-glass density in this vicinity currently. This merits observation but the resolution of overt solid nodularity is reassuring. 3. There is new abnormal wall thickening of several loops of distal small bowel, which could be from infectious enteritis or inflammatory bowel disease/Crohn' s disease. 4. Other imaging findings of potential clinical significance: Cholelithiasis. Sigmoid colon diverticulosis. Prominent prostate gland. Lumbar spondylosis and degenerative disc disease potentially causing mild impingement at L4-5 L5-S1.   ASSESSMENT &  PLAN:  70 year old gentleman with past medical history of diabetes, hypertension, who presents with anemia and weakness.  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  -Unfortunately he has stage IV disease with diffuse liver metastasis, this is an incurable disease, with overall very poor prognosis. -Is currently on second line FOLFIRI now, tolerating well, dose decreased from cycle 4 due to neutropenia. -I reviewed his staging CT scan results from 05/14/2015 with patient and his wife, he has had a good partial response in the liver metastasis, no other new lesions. He is clinically doing well -his CEA Has been slowly trending up, but overall level is still low, we'll continue monitoring. -I discussed his restaging CT scan from 08/06/2015, which showed stable disease overall, no significant new lesions. -I discussed the role of genomic testing, to see if his tumor contains any actionable mutations. The cost and reimbursement issue was discussed with him, he understands there is a possibility high out-of-pocket cost for this test if it's not covered by his insurance. Unfortunately the tumor tissue was not sufficient for the test. -I recommend him to have Gardant 360 blood genomic testing, he agrees  -he is tolerating chemo well, will continue   2. Microcytic anemia secondary to GI bleeding, iron deficiency and chemotherapy -His serum iron level and saturation were low, although ferritin is normal, he has some degree of iron deficient anemia, and anemia of malignancy  -Consider blood transfusion if hemoglobin less than 7.5 or symptomatic anemia -he received IV feraheme 510 mg twice in 09/2014, repeated iron study was normal on 07/23/2015 -He has restarted oral iron pill, and his anemia has been stale   3.  Hypertension and DM  -He  will continue follow-up with his primary care physician. -We discussed that we will monitor his blood pressure and blood sugar closely and may need to adjust his medication during the chemotherapy  -His diabetes has been better controlled since his insulin was adjusted  4. Peripheral neuropathy, secondary to chemotherapy, G1-2 -Overall improved. Stable lately. -Continue Neurontin  200 mg 3 times a day -We'll continue observation.   Plan: -lab reviewed, adequate for treatment, Cycle 14 FOLFIRI tomorrow and continue every 2 weeks, Neulasta on day 3.  -No blood transfusion this time. Consider blood transfusion if hemoglobin less than  7.5 or symptomatic -He knows to use Imodium for diarrhea of chemotherapy -I'll see him back in 4 weeks -Gardian 360 genomic testing in 2 weeks    Truitt Merle  09/24/2015

## 2015-09-24 NOTE — Telephone Encounter (Signed)
Gave and printed appt sched and avs for pt for April, May and June

## 2015-09-25 ENCOUNTER — Ambulatory Visit (HOSPITAL_BASED_OUTPATIENT_CLINIC_OR_DEPARTMENT_OTHER): Payer: Medicare Other

## 2015-09-25 VITALS — BP 119/48 | HR 64 | Temp 97.9°F | Resp 18

## 2015-09-25 DIAGNOSIS — C179 Malignant neoplasm of small intestine, unspecified: Secondary | ICD-10-CM | POA: Diagnosis not present

## 2015-09-25 DIAGNOSIS — Z5111 Encounter for antineoplastic chemotherapy: Secondary | ICD-10-CM

## 2015-09-25 LAB — CEA: CEA: 74.6 ng/mL — ABNORMAL HIGH (ref 0.0–4.7)

## 2015-09-25 MED ORDER — ATROPINE SULFATE 1 MG/ML IJ SOLN
INTRAMUSCULAR | Status: AC
  Filled 2015-09-25: qty 1

## 2015-09-25 MED ORDER — SODIUM CHLORIDE 0.9 % IV SOLN
Freq: Once | INTRAVENOUS | Status: AC
  Administered 2015-09-25: 08:00:00 via INTRAVENOUS
  Filled 2015-09-25: qty 8

## 2015-09-25 MED ORDER — SODIUM CHLORIDE 0.9 % IV SOLN
Freq: Once | INTRAVENOUS | Status: AC
  Administered 2015-09-25: 08:00:00 via INTRAVENOUS

## 2015-09-25 MED ORDER — SODIUM CHLORIDE 0.9 % IV SOLN
2000.0000 mg/m2 | INTRAVENOUS | Status: DC
  Administered 2015-09-25: 4250 mg via INTRAVENOUS
  Filled 2015-09-25: qty 85

## 2015-09-25 MED ORDER — ATROPINE SULFATE 1 MG/ML IJ SOLN
0.5000 mg | Freq: Once | INTRAMUSCULAR | Status: AC | PRN
  Administered 2015-09-25: 0.5 mg via INTRAVENOUS

## 2015-09-25 MED ORDER — SODIUM CHLORIDE 0.9 % IV SOLN
165.0000 mg/m2 | Freq: Once | INTRAVENOUS | Status: AC
  Administered 2015-09-25: 350 mg via INTRAVENOUS
  Filled 2015-09-25: qty 5.33

## 2015-09-25 MED ORDER — SODIUM CHLORIDE 0.9 % IV SOLN
400.0000 mg/m2 | Freq: Once | INTRAVENOUS | Status: AC
  Administered 2015-09-25: 848 mg via INTRAVENOUS
  Filled 2015-09-25: qty 42.4

## 2015-09-25 NOTE — Addendum Note (Signed)
Addended by: Truitt Merle on: 09/25/2015 07:47 AM   Modules accepted: Orders

## 2015-09-25 NOTE — Progress Notes (Signed)
Dr. Burr Medico gives ok to treat patient today with Hbg of 7.9.  Ma Hillock, RN

## 2015-09-25 NOTE — Patient Instructions (Signed)
[facility] Discharge Instructions for Patients Receiving Chemotherapy  Today you received the following chemotherapy agents:  5FU, Irinotecan, Leucovorin.  To help prevent nausea and vomiting after your treatment, we encourage you to take your nausea medication as prescribed.   If you develop nausea and vomiting that is not controlled by your nausea medication, call the clinic.   BELOW ARE SYMPTOMS THAT SHOULD BE REPORTED IMMEDIATELY:  *FEVER GREATER THAN 100.5 F  *CHILLS WITH OR WITHOUT FEVER  NAUSEA AND VOMITING THAT IS NOT CONTROLLED WITH YOUR NAUSEA MEDICATION  *UNUSUAL SHORTNESS OF BREATH  *UNUSUAL BRUISING OR BLEEDING  TENDERNESS IN MOUTH AND THROAT WITH OR WITHOUT PRESENCE OF ULCERS  *URINARY PROBLEMS  *BOWEL PROBLEMS  UNUSUAL RASH Items with * indicate a potential emergency and should be followed up as soon as possible.  Feel free to call the clinic you have any questions or concerns. The clinic phone number is [phone].  Please show the [facility] at check-in to the Emergency Department and triage nurse.

## 2015-09-27 ENCOUNTER — Ambulatory Visit: Payer: Medicare Other

## 2015-09-27 ENCOUNTER — Ambulatory Visit (HOSPITAL_BASED_OUTPATIENT_CLINIC_OR_DEPARTMENT_OTHER): Payer: Medicare Other

## 2015-09-27 VITALS — BP 125/63 | HR 58 | Temp 98.0°F | Resp 18

## 2015-09-27 DIAGNOSIS — C772 Secondary and unspecified malignant neoplasm of intra-abdominal lymph nodes: Secondary | ICD-10-CM | POA: Diagnosis not present

## 2015-09-27 DIAGNOSIS — Z5189 Encounter for other specified aftercare: Secondary | ICD-10-CM

## 2015-09-27 DIAGNOSIS — C787 Secondary malignant neoplasm of liver and intrahepatic bile duct: Secondary | ICD-10-CM | POA: Diagnosis not present

## 2015-09-27 DIAGNOSIS — C179 Malignant neoplasm of small intestine, unspecified: Secondary | ICD-10-CM

## 2015-09-27 MED ORDER — HEPARIN SOD (PORK) LOCK FLUSH 100 UNIT/ML IV SOLN
500.0000 [IU] | Freq: Once | INTRAVENOUS | Status: AC | PRN
  Administered 2015-09-27: 500 [IU]
  Filled 2015-09-27: qty 5

## 2015-09-27 MED ORDER — SODIUM CHLORIDE 0.9 % IJ SOLN
10.0000 mL | INTRAMUSCULAR | Status: DC | PRN
  Administered 2015-09-27: 10 mL
  Filled 2015-09-27: qty 10

## 2015-09-27 MED ORDER — PEGFILGRASTIM INJECTION 6 MG/0.6ML ~~LOC~~
6.0000 mg | PREFILLED_SYRINGE | Freq: Once | SUBCUTANEOUS | Status: AC
  Administered 2015-09-27: 6 mg via SUBCUTANEOUS
  Filled 2015-09-27: qty 0.6

## 2015-09-27 NOTE — Patient Instructions (Signed)
Pegfilgrastim injection (Neulasta) What is this medicine?  PEGFILGRASTIM (PEG fil gra stim) is a long-acting granulocyte colony-stimulating factor that stimulates the growth of neutrophils, a type of white blood cell important in the body's fight against infection. It is used to reduce the incidence of fever and infection in patients with certain types of cancer who are receiving chemotherapy that affects the bone marrow, and to increase survival after being exposed to high doses of radiation. This medicine may be used for other purposes; ask your health care provider or pharmacist if you have questions. What should I tell my health care provider before I take this medicine? They need to know if you have any of these conditions: -kidney disease -latex allergy -ongoing radiation therapy -sickle cell disease -skin reactions to acrylic adhesives (On-Body Injector only) -an unusual or allergic reaction to pegfilgrastim, filgrastim, other medicines, foods, dyes, or preservatives -pregnant or trying to get pregnant -breast-feeding How should I use this medicine? This medicine is for injection under the skin. If you get this medicine at home, you will be taught how to prepare and give the pre-filled syringe or how to use the On-body Injector. Refer to the patient Instructions for Use for detailed instructions. Use exactly as directed. Take your medicine at regular intervals. Do not take your medicine more often than directed. It is important that you put your used needles and syringes in a special sharps container. Do not put them in a trash can. If you do not have a sharps container, call your pharmacist or healthcare provider to get one. Talk to your pediatrician regarding the use of this medicine in children. While this drug may be prescribed for selected conditions, precautions do apply. Overdosage: If you think you have taken too much of this medicine contact a poison control center or emergency  room at once. NOTE: This medicine is only for you. Do not share this medicine with others. What if I miss a dose? It is important not to miss your dose. Call your doctor or health care professional if you miss your dose. If you miss a dose due to an On-body Injector failure or leakage, a new dose should be administered as soon as possible using a single prefilled syringe for manual use. What may interact with this medicine? Interactions have not been studied. Give your health care provider a list of all the medicines, herbs, non-prescription drugs, or dietary supplements you use. Also tell them if you smoke, drink alcohol, or use illegal drugs. Some items may interact with your medicine. This list may not describe all possible interactions. Give your health care provider a list of all the medicines, herbs, non-prescription drugs, or dietary supplements you use. Also tell them if you smoke, drink alcohol, or use illegal drugs. Some items may interact with your medicine. What should I watch for while using this medicine? You may need blood work done while you are taking this medicine. If you are going to need a MRI, CT scan, or other procedure, tell your doctor that you are using this medicine (On-Body Injector only). What side effects may I notice from receiving this medicine? Side effects that you should report to your doctor or health care professional as soon as possible: -allergic reactions like skin rash, itching or hives, swelling of the face, lips, or tongue -dizziness -fever -pain, redness, or irritation at site where injected -pinpoint red spots on the skin -red or dark-brown urine -shortness of breath or breathing problems -stomach or side pain,   or pain at the shoulder -swelling -tiredness -trouble passing urine or change in the amount of urine Side effects that usually do not require medical attention (report to your doctor or health care professional if they continue or are  bothersome): -bone pain -muscle pain This list may not describe all possible side effects. Call your doctor for medical advice about side effects. You may report side effects to FDA at 1-800-FDA-1088. Where should I keep my medicine? Keep out of the reach of children. Store pre-filled syringes in a refrigerator between 2 and 8 degrees C (36 and 46 degrees F). Do not freeze. Keep in carton to protect from light. Throw away this medicine if it is left out of the refrigerator for more than 48 hours. Throw away any unused medicine after the expiration date. NOTE: This sheet is a summary. It may not cover all possible information. If you have questions about this medicine, talk to your doctor, pharmacist, or health care provider.    2016, Elsevier/Gold Standard. (2014-06-08 14:30:14)  

## 2015-09-27 NOTE — Progress Notes (Signed)
Neulasta injection given by infusion nurse after home infusion pump disconnected 

## 2015-10-08 ENCOUNTER — Other Ambulatory Visit (HOSPITAL_BASED_OUTPATIENT_CLINIC_OR_DEPARTMENT_OTHER): Payer: Medicare Other

## 2015-10-08 DIAGNOSIS — C787 Secondary malignant neoplasm of liver and intrahepatic bile duct: Secondary | ICD-10-CM

## 2015-10-08 DIAGNOSIS — D630 Anemia in neoplastic disease: Secondary | ICD-10-CM

## 2015-10-08 DIAGNOSIS — C179 Malignant neoplasm of small intestine, unspecified: Secondary | ICD-10-CM | POA: Diagnosis not present

## 2015-10-08 DIAGNOSIS — C772 Secondary and unspecified malignant neoplasm of intra-abdominal lymph nodes: Secondary | ICD-10-CM

## 2015-10-08 LAB — COMPREHENSIVE METABOLIC PANEL
ALT: 17 U/L (ref 0–55)
ANION GAP: 8 meq/L (ref 3–11)
AST: 20 U/L (ref 5–34)
Albumin: 3.1 g/dL — ABNORMAL LOW (ref 3.5–5.0)
Alkaline Phosphatase: 114 U/L (ref 40–150)
BUN: 10.1 mg/dL (ref 7.0–26.0)
CO2: 25 meq/L (ref 22–29)
CREATININE: 1.1 mg/dL (ref 0.7–1.3)
Calcium: 8.6 mg/dL (ref 8.4–10.4)
Chloride: 108 mEq/L (ref 98–109)
EGFR: 68 mL/min/{1.73_m2} — ABNORMAL LOW (ref >90)
GLUCOSE: 126 mg/dL (ref 70–140)
Potassium: 3.7 mEq/L (ref 3.5–5.1)
Sodium: 142 mEq/L (ref 136–145)
TOTAL PROTEIN: 6.1 g/dL — AB (ref 6.4–8.3)

## 2015-10-08 LAB — CBC WITH DIFFERENTIAL/PLATELET
BASO%: 0.6 % (ref 0.0–2.0)
Basophils Absolute: 0 10*3/uL (ref 0.0–0.1)
EOS%: 4.5 % (ref 0.0–7.0)
Eosinophils Absolute: 0.2 10*3/uL (ref 0.0–0.5)
HCT: 27.6 % — ABNORMAL LOW (ref 38.4–49.9)
HGB: 8.3 g/dL — ABNORMAL LOW (ref 13.0–17.1)
LYMPH#: 1.2 10*3/uL (ref 0.9–3.3)
LYMPH%: 27.3 % (ref 14.0–49.0)
MCH: 29.3 pg (ref 27.2–33.4)
MCHC: 30.2 g/dL — ABNORMAL LOW (ref 32.0–36.0)
MCV: 96.7 fL (ref 79.3–98.0)
MONO#: 0.5 10*3/uL (ref 0.1–0.9)
MONO%: 11.5 % (ref 0.0–14.0)
NEUT%: 56.1 % (ref 39.0–75.0)
NEUTROS ABS: 2.5 10*3/uL (ref 1.5–6.5)
PLATELETS: 150 10*3/uL (ref 140–400)
RBC: 2.85 10*6/uL — AB (ref 4.20–5.82)
RDW: 20.2 % — ABNORMAL HIGH (ref 11.0–14.6)
WBC: 4.5 10*3/uL (ref 4.0–10.3)

## 2015-10-09 ENCOUNTER — Encounter: Payer: Self-pay | Admitting: *Deleted

## 2015-10-09 ENCOUNTER — Ambulatory Visit (HOSPITAL_BASED_OUTPATIENT_CLINIC_OR_DEPARTMENT_OTHER): Payer: Medicare Other

## 2015-10-09 VITALS — BP 131/55 | HR 59 | Temp 98.5°F | Resp 16

## 2015-10-09 DIAGNOSIS — Z5111 Encounter for antineoplastic chemotherapy: Secondary | ICD-10-CM | POA: Diagnosis not present

## 2015-10-09 DIAGNOSIS — C719 Malignant neoplasm of brain, unspecified: Secondary | ICD-10-CM

## 2015-10-09 DIAGNOSIS — C179 Malignant neoplasm of small intestine, unspecified: Secondary | ICD-10-CM

## 2015-10-09 MED ORDER — SODIUM CHLORIDE 0.9 % IV SOLN
2000.0000 mg/m2 | INTRAVENOUS | Status: DC
  Administered 2015-10-09: 4250 mg via INTRAVENOUS
  Filled 2015-10-09: qty 85

## 2015-10-09 MED ORDER — SODIUM CHLORIDE 0.9 % IV SOLN
Freq: Once | INTRAVENOUS | Status: AC
  Administered 2015-10-09: 08:00:00 via INTRAVENOUS

## 2015-10-09 MED ORDER — ATROPINE SULFATE 1 MG/ML IJ SOLN
INTRAMUSCULAR | Status: AC
  Filled 2015-10-09: qty 1

## 2015-10-09 MED ORDER — SODIUM CHLORIDE 0.9 % IV SOLN
Freq: Once | INTRAVENOUS | Status: AC
  Administered 2015-10-09: 09:00:00 via INTRAVENOUS
  Filled 2015-10-09: qty 8

## 2015-10-09 MED ORDER — SODIUM CHLORIDE 0.9 % IV SOLN
165.0000 mg/m2 | Freq: Once | INTRAVENOUS | Status: AC
  Administered 2015-10-09: 350 mg via INTRAVENOUS
  Filled 2015-10-09: qty 0.5

## 2015-10-09 MED ORDER — ATROPINE SULFATE 1 MG/ML IJ SOLN
0.5000 mg | Freq: Once | INTRAMUSCULAR | Status: AC | PRN
  Administered 2015-10-09: 0.5 mg via INTRAVENOUS

## 2015-10-09 MED ORDER — LEUCOVORIN CALCIUM INJECTION 350 MG
400.0000 mg/m2 | Freq: Once | INTRAMUSCULAR | Status: AC
  Administered 2015-10-09: 848 mg via INTRAVENOUS
  Filled 2015-10-09: qty 42.4

## 2015-10-09 NOTE — Progress Notes (Signed)
Oncology Nurse Navigator Documentation  Oncology Nurse Navigator Flowsheets 10/09/2015  Navigator Location [facility]  Navigator Encounter Type Treatment  Patient Visit Type MedOnc  Treatment Phase Active Tx--FOLFIRI  Barriers/Navigation Needs Coordination of Care--signature for Guardant 360  Education -  Interventions Coordination of Care--met w/patient in tx area and explained need for his consent for Guardant 360 testing. He agreed and signed document. Forwarded to lab.  Education Method -  Support Groups/Services -  Acuity -  Time Spent with Patient 15  Time Spent with Patient (Retired) -

## 2015-10-09 NOTE — Patient Instructions (Signed)
[facility] Discharge Instructions for Patients Receiving Chemotherapy  Today you received the following chemotherapy agents Irinotecan, Leucovorin and 5 FU  To help prevent nausea and vomiting after your treatment, we encourage you to take your nausea medication as directed   If you develop nausea and vomiting that is not controlled by your nausea medication, call the clinic.   BELOW ARE SYMPTOMS THAT SHOULD BE REPORTED IMMEDIATELY:  *FEVER GREATER THAN 100.5 F  *CHILLS WITH OR WITHOUT FEVER  NAUSEA AND VOMITING THAT IS NOT CONTROLLED WITH YOUR NAUSEA MEDICATION  *UNUSUAL SHORTNESS OF BREATH  *UNUSUAL BRUISING OR BLEEDING  TENDERNESS IN MOUTH AND THROAT WITH OR WITHOUT PRESENCE OF ULCERS  *URINARY PROBLEMS  *BOWEL PROBLEMS  UNUSUAL RASH Items with * indicate a potential emergency and should be followed up as soon as possible.  Feel free to call the clinic you have any questions or concerns. The clinic phone number is [phone].  Please show the CHEMO ALERT CARD at check-in to the Emergency Department and triage nurse.   

## 2015-10-11 ENCOUNTER — Ambulatory Visit: Payer: Medicare Other

## 2015-10-11 ENCOUNTER — Ambulatory Visit (HOSPITAL_BASED_OUTPATIENT_CLINIC_OR_DEPARTMENT_OTHER): Payer: Medicare Other

## 2015-10-11 ENCOUNTER — Other Ambulatory Visit: Payer: Self-pay | Admitting: Hematology

## 2015-10-11 ENCOUNTER — Telehealth: Payer: Self-pay

## 2015-10-11 VITALS — BP 112/51 | HR 53 | Temp 98.3°F

## 2015-10-11 DIAGNOSIS — Z5189 Encounter for other specified aftercare: Secondary | ICD-10-CM

## 2015-10-11 DIAGNOSIS — C179 Malignant neoplasm of small intestine, unspecified: Secondary | ICD-10-CM

## 2015-10-11 MED ORDER — HEPARIN SOD (PORK) LOCK FLUSH 100 UNIT/ML IV SOLN
500.0000 [IU] | Freq: Once | INTRAVENOUS | Status: AC | PRN
  Administered 2015-10-11: 500 [IU]
  Filled 2015-10-11: qty 5

## 2015-10-11 MED ORDER — PEGFILGRASTIM INJECTION 6 MG/0.6ML ~~LOC~~
6.0000 mg | PREFILLED_SYRINGE | Freq: Once | SUBCUTANEOUS | Status: AC
  Administered 2015-10-11: 6 mg via SUBCUTANEOUS
  Filled 2015-10-11: qty 0.6

## 2015-10-11 MED ORDER — SODIUM CHLORIDE 0.9 % IJ SOLN
10.0000 mL | INTRAMUSCULAR | Status: DC | PRN
  Administered 2015-10-11: 10 mL
  Filled 2015-10-11: qty 10

## 2015-10-11 NOTE — Telephone Encounter (Signed)
Tyler Evans from [facility] called to get the pt's primary cancer diagnosis for the guardant 360 test ordered. 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

## 2015-10-11 NOTE — Progress Notes (Signed)
Pump disconnected by injection RN.

## 2015-10-22 ENCOUNTER — Other Ambulatory Visit (HOSPITAL_BASED_OUTPATIENT_CLINIC_OR_DEPARTMENT_OTHER): Payer: Medicare Other

## 2015-10-22 ENCOUNTER — Telehealth: Payer: Self-pay | Admitting: *Deleted

## 2015-10-22 ENCOUNTER — Ambulatory Visit (HOSPITAL_BASED_OUTPATIENT_CLINIC_OR_DEPARTMENT_OTHER): Payer: Medicare Other | Admitting: Hematology

## 2015-10-22 ENCOUNTER — Telehealth: Payer: Self-pay | Admitting: Hematology

## 2015-10-22 ENCOUNTER — Encounter: Payer: Self-pay | Admitting: Hematology

## 2015-10-22 VITALS — BP 120/50 | HR 60 | Temp 98.0°F | Resp 18 | Ht 66.0 in | Wt 183.9 lb

## 2015-10-22 DIAGNOSIS — D500 Iron deficiency anemia secondary to blood loss (chronic): Secondary | ICD-10-CM

## 2015-10-22 DIAGNOSIS — C179 Malignant neoplasm of small intestine, unspecified: Secondary | ICD-10-CM

## 2015-10-22 DIAGNOSIS — R5383 Other fatigue: Secondary | ICD-10-CM

## 2015-10-22 DIAGNOSIS — D630 Anemia in neoplastic disease: Secondary | ICD-10-CM | POA: Diagnosis not present

## 2015-10-22 DIAGNOSIS — C787 Secondary malignant neoplasm of liver and intrahepatic bile duct: Secondary | ICD-10-CM

## 2015-10-22 DIAGNOSIS — D6481 Anemia due to antineoplastic chemotherapy: Secondary | ICD-10-CM

## 2015-10-22 DIAGNOSIS — G622 Polyneuropathy due to other toxic agents: Secondary | ICD-10-CM

## 2015-10-22 DIAGNOSIS — G620 Drug-induced polyneuropathy: Secondary | ICD-10-CM

## 2015-10-22 DIAGNOSIS — C772 Secondary and unspecified malignant neoplasm of intra-abdominal lymph nodes: Secondary | ICD-10-CM | POA: Diagnosis not present

## 2015-10-22 DIAGNOSIS — T451X5A Adverse effect of antineoplastic and immunosuppressive drugs, initial encounter: Secondary | ICD-10-CM

## 2015-10-22 DIAGNOSIS — E119 Type 2 diabetes mellitus without complications: Secondary | ICD-10-CM

## 2015-10-22 DIAGNOSIS — D509 Iron deficiency anemia, unspecified: Secondary | ICD-10-CM

## 2015-10-22 DIAGNOSIS — I10 Essential (primary) hypertension: Secondary | ICD-10-CM

## 2015-10-22 LAB — COMPREHENSIVE METABOLIC PANEL
ALT: 17 U/L (ref 0–55)
ANION GAP: 8 meq/L (ref 3–11)
AST: 20 U/L (ref 5–34)
Albumin: 3.1 g/dL — ABNORMAL LOW (ref 3.5–5.0)
Alkaline Phosphatase: 103 U/L (ref 40–150)
BUN: 8.8 mg/dL (ref 7.0–26.0)
CHLORIDE: 109 meq/L (ref 98–109)
CO2: 26 meq/L (ref 22–29)
CREATININE: 1.1 mg/dL (ref 0.7–1.3)
Calcium: 8.7 mg/dL (ref 8.4–10.4)
EGFR: 64 mL/min/{1.73_m2} — ABNORMAL LOW (ref >90)
Glucose: 139 mg/dl (ref 70–140)
Potassium: 4.1 mEq/L (ref 3.5–5.1)
Sodium: 143 mEq/L (ref 136–145)
Total Bilirubin: <0.3 mg/dL (ref 0.20–1.20)
Total Protein: 6.1 g/dL — ABNORMAL LOW (ref 6.4–8.3)

## 2015-10-22 LAB — CBC WITH DIFFERENTIAL/PLATELET
BASO%: 0.4 % (ref 0.0–2.0)
BASOS ABS: 0 10*3/uL (ref 0.0–0.1)
EOS ABS: 0.1 10*3/uL (ref 0.0–0.5)
EOS%: 1.4 % (ref 0.0–7.0)
HEMATOCRIT: 28 % — AB (ref 38.4–49.9)
HEMOGLOBIN: 8.7 g/dL — AB (ref 13.0–17.1)
LYMPH#: 1.4 10*3/uL (ref 0.9–3.3)
LYMPH%: 21 % (ref 14.0–49.0)
MCH: 29 pg (ref 27.2–33.4)
MCHC: 31.1 g/dL — ABNORMAL LOW (ref 32.0–36.0)
MCV: 93.2 fL (ref 79.3–98.0)
MONO#: 1 10*3/uL — ABNORMAL HIGH (ref 0.1–0.9)
MONO%: 15.1 % — ABNORMAL HIGH (ref 0.0–14.0)
NEUT#: 4.1 10*3/uL (ref 1.5–6.5)
NEUT%: 62.1 % (ref 39.0–75.0)
PLATELETS: 156 10*3/uL (ref 140–400)
RBC: 3.01 10*6/uL — ABNORMAL LOW (ref 4.20–5.82)
RDW: 18.9 % — AB (ref 11.0–14.6)
WBC: 6.6 10*3/uL (ref 4.0–10.3)

## 2015-10-22 NOTE — Telephone Encounter (Signed)
per pof to sch pt appt-sent MW emailt os ch trmt-pt to get updated copy of avs on 5/23-adv [facility] to sch scan-gave contrast

## 2015-10-22 NOTE — Telephone Encounter (Signed)
Per staff message and POF I have scheduled appts. Advised scheduler of appts. JMW  

## 2015-10-22 NOTE — Progress Notes (Signed)
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    08/18/2014 Miscellaneous tumor KRAS mutation (-)   08/18/2014 Pathology Results Liver biopsy showed metastatic adenocarcinoma, consistent with GI primary   08/18/2014 Initial Diagnosis Small bowel cancer   08/18/2014 Procedure small bowel enteroscopy with biopsy by Dr. Benson Norway showed at the proximal jejunum there was evidence of abnormal mucosa and friability, biopsy was obtained.    09/05/2014 Imaging PET hypermetabolic mass involving a small bowel loop with adjacent mesenteric lymphadenopathy, and diffuse liver metastasis and mild hypermetabolic lymphadenopathy in porta hepatis.   09/06/2014 - 02/01/2015 Chemotherapy mFOLFOX, stopped due to his neuropathy, and earlier mild disease progression on PET    09/07/2014 - 09/11/2014 Hospital Admission He was admitted for fever and GI bleeding. Received 3 units of RBC.   10/26/2014 Imaging Interval significant improvement in the primary small bowel mass, adjacent lymphadenopathy and extensive hepatic metastatic disease. No other new lesions.    02/09/2015 Imaging PET/CT scan showed stable primary malignancy in the proximal jejunum, mild metabolic progression of the 3 residual metabolic liver metastasis. CT  Portion showed decreased size of his liver metastasis.   02/27/2015 -  Chemotherapy second line chemo FOLFIRI, every 2 weeks, and panitumumab (held for second cycle due to severe skin rashes)    08/06/2015 Imaging Stable number and size of the liver lesions. The left lower lobe nodule is considerably less prominent. No other new lesions.     HISTORY OF PRESENTING ILLNESS:  Tyler Evans 70 y.o. male is here because of a recent diagnosis of small bowel cancer. He presented to his PCP on 08/17/2014 with complaints of feeling weak and a one month history of a cough. He was found to be anemic and was referred to the emergency department. Hemoglobin was found to be 7.6, MCV 76.6; creatinine was elevated at 1.74. Stool was Hemoccult positive.  CT abdomen/pelvis without contrast on 08/17/2014 showed multiple large masses within the liver, bulky in appearance. The largest measured approximately 5.7 cm. There appeared to be a focal filling defect within the proximal jejunum measuring approximately 2.5 x 1.7 cm. There was mild adjacent jejunal wall thickening. The lung bases were clear.  On 08/18/2014 he underwent a small bowel enteroscopy with biopsy by Dr. Benson Norway. The esophagus and gastric lumen were normal. At the proximal jejunum there was evidence of abnormal mucosa and friability. The pediatric colonoscope was not able to traverse the area. Biopsies were obtained. An ultraslim colonoscope was then utilized. This colonoscope was able to traverse the area of stenosis which measured approximately 3 cm in length. 50% of the lumen was ulcerated. 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 MMR stains are pending.  He was discharged home on 08/19/2014.  CURRENT THERAPY: FOLFIRI with 5FU '2400mg'$ /m2 (decresed to '2000mg'$ /m2 from cycle 4) over 46 hours and irinotecan '180mg'$ /m2 (decreased to '165mg'$ /m2 from cycle 4), every 2 weeks.   INTERIM HISTORY:  Mr. Nicodemus returns for  follow-up. He is clinically doing well overall. He still has mild peripheral neuropathy, numbness on his fingers and toes, moderate tingling, he takes Neurontin 200 mg 3 times a day. His neuropathy  usually gets worse for a few days after chemotherapy, then recovers.He otherwise tolerating chemotherapy very well, no nausea, diarrhea, or other complaints. His moderate fatigue is stable, is able to tolerate daily activity, including gardening, without much difficulty. Weight is stable.  MEDICAL HISTORY:  Past Medical History  Diagnosis Date  . Hypertension   . Small bowel cancer ([place]) 08/18/2014  . Diabetes mellitus without complication ([place])     SURGICAL HISTORY: Past Surgical History  Procedure Laterality Date  . Enteroscopy N/A 08/18/2014    Procedure: ENTEROSCOPY;  Surgeon: Carol Ada, MD;  Location: [facility];  Service: Endoscopy;  Laterality: N/A;    SOCIAL HISTORY: History   Social History  . Marital Status: Married    Spouse Name: N/A  . Number of Children: 2  . Years of Education: N/A   Occupational History  .  retired Ambulance person    Social History Main Topics  . Smoking status: Never Smoker   . Smokeless tobacco: Not on file  . Alcohol Use: No  . Drug Use: Not on file  . Sexual Activity: Not on file   Other Topics Concern  . Not on file   Social History Narrative  He lives in [place]. He is married. He has 2 children both reported to be in good health. No tobacco or alcohol use. He is a retired Ambulance person.  FAMILY HISTORY: Family History  Problem Relation Age of Onset  . Heart failure, Alzheimer's  Mother   . Lung cancer Father   . Kidney cancer Brother     ALLERGIES:  is allergic to penicillins.  MEDICATIONS:  Current Outpatient Prescriptions on File Prior to Visit  Medication Sig Dispense Refill  . atorvastatin (LIPITOR) 20 MG tablet Take 20 mg by mouth daily.    . Blood Glucose Monitoring Suppl (ONE TOUCH ULTRA MINI) W/DEVICE KIT     . clindamycin (CLINDAGEL) 1 % gel Apply topically 2 (two) times daily. 30 g 2  . gabapentin (NEURONTIN) 100 MG capsule TAKE 2 CAPSULES BY MOUTH 3 TIMES A DAY 180 capsule 1  . hydrocortisone 2.5 %  cream Apply topically 2 (two) times daily. 30 g 2  . LANTUS SOLOSTAR 100 UNIT/ML Solostar Pen     . lidocaine-prilocaine (EMLA) cream APPLY TO AFFECTED AREA ONCE  3  . lisinopril (PRINIVIL,ZESTRIL) 10 MG tablet Take 10 mg by mouth daily.  6  . ONE TOUCH ULTRA TEST test strip     . PRESCRIPTION MEDICATION See admin instructions. Receives Vectibix, Leucovorin, and Adrucil chemotherapy every 2 weeks.    . tamsulosin (FLOMAX) 0.4 MG CAPS capsule Take 0.4 mg by mouth daily.  12   No current facility-administered medications on file prior to visit.  ;  REVIEW OF SYSTEMS:   Constitutional: Denies fevers, chills or abnormal night sweats; mild fatigue, weight is stable.  Eyes: Denies blurriness of vision, double vision or watery eyes Ears, nose, mouth, throat, and face: Denies mucositis or sore throat Respiratory: One month history of a nonproductive cough;  no shortness of breath Cardiovascular: Denies palpitation, chest discomfort or lower extremity swelling Gastrointestinal:  Denies nausea, heartburn. No dysphagia. He has noted less frequent bowel movements over the past 2 weeks. Does not characterize this as constipation. Skin: Denies abnormal skin rashes Lymphatics: Denies new lymphadenopathy or easy  bruising Neurological: Denies numbness, tingling. No extremity weakness Behavioral/Psych: Mood is stable, no new changes  All other systems were reviewed with the patient and are negative.  PHYSICAL EXAMINATION: ECOG PERFORMANCE STATUS: 1  Filed Vitals:   10/22/15 0846  BP: 120/50  Pulse: 60  Temp: 98 F (36.7 C)  Resp: 18   Filed Weights   10/22/15 0846  Weight: 183 lb 14.4 oz (83.416 kg)    GENERAL:alert, no distress and comfortable SKIN: skin color, texture, turgor are normal, (+) skin pigmentation from his rash on his face, neck, chest and upper abdomen.  EYES: normal, conjunctiva are pink and non-injected, sclera clear OROPHARYNX:no exudate, no erythema and lips, buccal  mucosa, and tongue normal  NECK: supple, thyroid normal size, non-tender, without nodularity LYMPH:  no palpable lymphadenopathy in the cervical, axillary or inguinal regions LUNGS: clear to auscultation and percussion with normal breathing effort HEART: regular rate & rhythm and no murmurs and no lower extremity edema ABDOMEN:abdomen soft, mild tenderness at the right upper quadrant , no rebound pain, normal bowel sounds Musculoskeletal:no cyanosis of digits and no clubbing  PSYCH: alert & oriented x 3 with fluent speech NEURO: no focal motor deficits, his light touch sensation and vibration sensation are diminished on his hands and feet.   LABORATORY DATA:  I have reviewed the data as listed CBC Latest Ref Rng 10/22/2015 10/08/2015 09/24/2015  WBC 4.0 - 10.3 10e3/uL 6.6 4.5 7.2  Hemoglobin 13.0 - 17.1 g/dL 8.7(L) 8.3(L) 7.9(L)  Hematocrit 38.4 - 49.9 % 28.0(L) 27.6(L) 25.8(L)  Platelets 140 - 400 10e3/uL 156 150 176    CMP Latest Ref Rng 10/08/2015 09/24/2015 09/03/2015  Glucose 70 - 140 mg/dl 126 172(H) 70  BUN 7.0 - 26.0 mg/dL 10.1 13.6 9.3  Creatinine 0.7 - 1.3 mg/dL 1.1 1.1 1.1  Sodium 136 - 145 mEq/L 142 140 143  Potassium 3.5 - 5.1 mEq/L 3.7 4.0 3.9  CO2 22 - 29 mEq/L '25 24 27  '$ Calcium 8.4 - 10.4 mg/dL 8.6 8.7 8.7  Total Protein 6.4 - 8.3 g/dL 6.1(L) 5.8(L) 6.1(L)  Total Bilirubin 0.20 - 1.20 mg/dL <0.30 0.32 <0.30  Alkaline Phos 40 - 150 U/L 114 106 108  AST 5 - 34 U/L 20 44(H) 22  ALT 0 - 55 U/L '17 30 16   '$ Results for VONZELL, LINDBLAD (MRN [id number]) as of 10/22/2015 07:11  Ref. Range 05/29/2015 08:20 06/25/2015 11:24 07/09/2015 13:43 08/06/2015 09:20 09/24/2015 07:51  CEA Latest Units: ng/mL 17.6 (H) 20.2 (H) 25.0    CEA Latest Ref Range: 0.0-4.7 ng/mL  33.6 (H) 41.0 (H) 50.5 (H) 74.6 (H)    Pathology report:  Small Intestine Biopsy, jejunal mass 08/18/2014 - INVASIVE ADENOCARCINOMA IN A BACKGROUND OF TUBULOVILLOUS ADENOMA. ADDITIONAL INFORMATION: Mismatch Repair (MMR) Protein  Immunohistochemistry (IHC) IHC Expression Result (LIMITED TUMOR): MLH1: Preserved nuclear expression (greater 50% tumor expression) MSH2: Preserved nuclear expression (greater 50% tumor expression) MSH6: Preserved nuclear expression (greater 50% tumor expression) PMS2: Preserved nuclear expression (greater 50% tumor expression) * Internal control demonstrates intact nuclear expression Interpretation: NORMAL There is preserved expression  Diagnosis 08/28/2014  Liver, needle/core biopsy, right lobe - METASTATIC ADENOCARCINOMA   RADIOGRAPHIC STUDIES: I have personally reviewed the radiological images as listed and agreed with the findings in the report.   CT chest, abdomen and pelvis with contrast 08/06/2015 IMPRESSION: 1. Stable number and size of the liver lesions. The previous small bowel tumor in the vicinity of the ligament of Treitz is not well seen. 2.  The left lower lobe nodule of concern for enlargement on the prior exam is actually considerably less prominent on today' s exam, with only faint ground-glass density in this vicinity currently. This merits observation but the resolution of overt solid nodularity is reassuring. 3. There is new abnormal wall thickening of several loops of distal small bowel, which could be from infectious enteritis or inflammatory bowel disease/Crohn' s disease. 4. Other imaging findings of potential clinical significance: Cholelithiasis. Sigmoid colon diverticulosis. Prominent prostate gland. Lumbar spondylosis and degenerative disc disease potentially causing mild impingement at L4-5 L5-S1.   ASSESSMENT & PLAN:  70 year old gentleman with past medical history of diabetes, hypertension, who presents with anemia and weakness.  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    -Unfortunately he has stage IV disease with diffuse liver metastasis, this is an incurable disease, with overall very poor prognosis. -Is currently on second line FOLFIRI now, tolerating well, dose decreased from cycle 4 due to neutropenia. -I reviewed his staging CT scan results from 05/14/2015 with patient and his wife, he has had a good partial response in the liver metastasis, no other new lesions. He is clinically doing well -his CEA Has been slowly trending up, but overall level is still low, we'll continue monitoring. -I discussed his restaging CT scan from 08/06/2015, which showed stable disease overall, no significant new lesions. -I discussed his guardant 360 number testing results, which showed mutations in KRAS, RAF 1, APC, TP 53, therapy available at this point. -he is tolerating chemo well, will continue, I will postpone his chemotherapy in 4 weeks to 5 weeks, since I am out of office in 4 week, we'll repeat his CT scan in 5 weeks.  2. Microcytic anemia secondary to GI bleeding, iron deficiency and chemotherapy -His serum iron level and saturation were low, although ferritin is normal, he has some degree of iron deficient anemia, and anemia of malignancy  -Consider blood transfusion if hemoglobin less than 7.5 or symptomatic anemia -he received IV feraheme 510 mg twice in 09/2014, repeated iron study was normal on 07/23/2015 -He has restarted oral iron pill, and his anemia is slightly better today   3.  Hypertension and DM  -He will continue follow-up with his primary care physician. -We discussed that we will monitor his blood pressure and blood sugar closely and may need to adjust his medication during the chemotherapy  -His diabetes has been better controlled since his insulin was adjusted  4. Peripheral neuropathy, secondary to chemotherapy, G1-2 -Overall improved. Stable lately. -Continue Neurontin  200 mg 3 times a day -We'll continue observation.   Plan: -lab reviewed,  adequate for treatment, Cycle 16 FOLFIRI tomorrow and continue every 2 weeks, Neulasta on day 3.  -No blood transfusion this time. Consider blood transfusion if hemoglobin less than 7.5 or symptomatic -He knows to use Imodium for diarrhea of chemotherapy -I'll see him back in 5 weeks with a restaging CT    Truitt Merle  10/22/2015

## 2015-10-23 ENCOUNTER — Ambulatory Visit (HOSPITAL_BASED_OUTPATIENT_CLINIC_OR_DEPARTMENT_OTHER): Payer: Medicare Other

## 2015-10-23 ENCOUNTER — Other Ambulatory Visit: Payer: Self-pay | Admitting: Hematology

## 2015-10-23 VITALS — BP 128/47 | HR 61 | Temp 98.4°F | Resp 18

## 2015-10-23 DIAGNOSIS — Z5111 Encounter for antineoplastic chemotherapy: Secondary | ICD-10-CM | POA: Diagnosis not present

## 2015-10-23 DIAGNOSIS — C179 Malignant neoplasm of small intestine, unspecified: Secondary | ICD-10-CM | POA: Diagnosis not present

## 2015-10-23 LAB — CEA: CEA1: 92.7 ng/mL — AB (ref 0.0–4.7)

## 2015-10-23 MED ORDER — SODIUM CHLORIDE 0.9 % IV SOLN
165.0000 mg/m2 | Freq: Once | INTRAVENOUS | Status: AC
  Administered 2015-10-23: 350 mg via INTRAVENOUS
  Filled 2015-10-23: qty 5.5

## 2015-10-23 MED ORDER — ATROPINE SULFATE 1 MG/ML IJ SOLN
0.5000 mg | Freq: Once | INTRAMUSCULAR | Status: AC | PRN
Start: 2015-10-23 — End: 2015-10-23
  Administered 2015-10-23: 0.5 mg via INTRAVENOUS

## 2015-10-23 MED ORDER — ATROPINE SULFATE 1 MG/ML IJ SOLN
INTRAMUSCULAR | Status: AC
  Filled 2015-10-23: qty 1

## 2015-10-23 MED ORDER — SODIUM CHLORIDE 0.9 % IV SOLN
Freq: Once | INTRAVENOUS | Status: AC
  Administered 2015-10-23: 08:00:00 via INTRAVENOUS

## 2015-10-23 MED ORDER — SODIUM CHLORIDE 0.9 % IV SOLN
400.0000 mg/m2 | Freq: Once | INTRAVENOUS | Status: AC
  Administered 2015-10-23: 848 mg via INTRAVENOUS
  Filled 2015-10-23: qty 42.4

## 2015-10-23 MED ORDER — SODIUM CHLORIDE 0.9 % IV SOLN
Freq: Once | INTRAVENOUS | Status: AC
  Administered 2015-10-23: 09:00:00 via INTRAVENOUS
  Filled 2015-10-23: qty 8

## 2015-10-23 MED ORDER — SODIUM CHLORIDE 0.9 % IV SOLN
2000.0000 mg/m2 | INTRAVENOUS | Status: DC
  Administered 2015-10-23: 4250 mg via INTRAVENOUS
  Filled 2015-10-23: qty 85

## 2015-10-23 NOTE — Patient Instructions (Signed)
[facility] Discharge Instructions for Patients Receiving Chemotherapy  Today you received the following chemotherapy agents: Irinotecan, Leucovorin and Fluorouracil.  To help prevent nausea and vomiting after your treatment, we encourage you to take your nausea medication as directed.   If you develop nausea and vomiting that is not controlled by your nausea medication, call the clinic.   BELOW ARE SYMPTOMS THAT SHOULD BE REPORTED IMMEDIATELY:  *FEVER GREATER THAN 100.5 F  *CHILLS WITH OR WITHOUT FEVER  NAUSEA AND VOMITING THAT IS NOT CONTROLLED WITH YOUR NAUSEA MEDICATION  *UNUSUAL SHORTNESS OF BREATH  *UNUSUAL BRUISING OR BLEEDING  TENDERNESS IN MOUTH AND THROAT WITH OR WITHOUT PRESENCE OF ULCERS  *URINARY PROBLEMS  *BOWEL PROBLEMS  UNUSUAL RASH Items with * indicate a potential emergency and should be followed up as soon as possible.  Feel free to call the clinic you have any questions or concerns. The clinic phone number is [phone].  Please show the [facility] at check-in to the Emergency Department and triage nurse.

## 2015-10-25 ENCOUNTER — Ambulatory Visit: Payer: Medicare Other

## 2015-10-25 ENCOUNTER — Ambulatory Visit (HOSPITAL_BASED_OUTPATIENT_CLINIC_OR_DEPARTMENT_OTHER): Payer: Medicare Other

## 2015-10-25 VITALS — BP 131/49 | HR 59 | Temp 98.4°F | Resp 17

## 2015-10-25 DIAGNOSIS — C179 Malignant neoplasm of small intestine, unspecified: Secondary | ICD-10-CM

## 2015-10-25 DIAGNOSIS — Z5189 Encounter for other specified aftercare: Secondary | ICD-10-CM

## 2015-10-25 MED ORDER — PEGFILGRASTIM INJECTION 6 MG/0.6ML ~~LOC~~
6.0000 mg | PREFILLED_SYRINGE | Freq: Once | SUBCUTANEOUS | Status: AC
  Administered 2015-10-25: 6 mg via SUBCUTANEOUS
  Filled 2015-10-25: qty 0.6

## 2015-10-25 MED ORDER — SODIUM CHLORIDE 0.9 % IJ SOLN
10.0000 mL | INTRAMUSCULAR | Status: DC | PRN
  Administered 2015-10-25: 10 mL
  Filled 2015-10-25: qty 10

## 2015-10-25 MED ORDER — HEPARIN SOD (PORK) LOCK FLUSH 100 UNIT/ML IV SOLN
500.0000 [IU] | Freq: Once | INTRAVENOUS | Status: AC | PRN
  Administered 2015-10-25: 500 [IU]
  Filled 2015-10-25: qty 5

## 2015-10-25 NOTE — Progress Notes (Signed)
Pt given neulasta in infusion room.  

## 2015-10-25 NOTE — Patient Instructions (Signed)
Pegfilgrastim injection What is this medicine? PEGFILGRASTIM (PEG fil gra stim) is a long-acting granulocyte colony-stimulating factor that stimulates the growth of neutrophils, a type of white blood cell important in the body's fight against infection. It is used to reduce the incidence of fever and infection in patients with certain types of cancer who are receiving chemotherapy that affects the bone marrow, and to increase survival after being exposed to high doses of radiation. This medicine may be used for other purposes; ask your health care provider or pharmacist if you have questions. What should I tell my health care provider before I take this medicine? They need to know if you have any of these conditions: -kidney disease -latex allergy -ongoing radiation therapy -sickle cell disease -skin reactions to acrylic adhesives (On-Body Injector only) -an unusual or allergic reaction to pegfilgrastim, filgrastim, other medicines, foods, dyes, or preservatives -pregnant or trying to get pregnant -breast-feeding How should I use this medicine? This medicine is for injection under the skin. If you get this medicine at home, you will be taught how to prepare and give the pre-filled syringe or how to use the On-body Injector. Refer to the patient Instructions for Use for detailed instructions. Use exactly as directed. Take your medicine at regular intervals. Do not take your medicine more often than directed. It is important that you put your used needles and syringes in a special sharps container. Do not put them in a trash can. If you do not have a sharps container, call your pharmacist or healthcare provider to get one. Talk to your pediatrician regarding the use of this medicine in children. While this drug may be prescribed for selected conditions, precautions do apply. Overdosage: If you think you have taken too much of this medicine contact a poison control center or emergency room at  once. NOTE: This medicine is only for you. Do not share this medicine with others. What if I miss a dose? It is important not to miss your dose. Call your doctor or health care professional if you miss your dose. If you miss a dose due to an On-body Injector failure or leakage, a new dose should be administered as soon as possible using a single prefilled syringe for manual use. What may interact with this medicine? Interactions have not been studied. Give your health care provider a list of all the medicines, herbs, non-prescription drugs, or dietary supplements you use. Also tell them if you smoke, drink alcohol, or use illegal drugs. Some items may interact with your medicine. This list may not describe all possible interactions. Give your health care provider a list of all the medicines, herbs, non-prescription drugs, or dietary supplements you use. Also tell them if you smoke, drink alcohol, or use illegal drugs. Some items may interact with your medicine. What should I watch for while using this medicine? You may need blood work done while you are taking this medicine. If you are going to need a MRI, CT scan, or other procedure, tell your doctor that you are using this medicine (On-Body Injector only). What side effects may I notice from receiving this medicine? Side effects that you should report to your doctor or health care professional as soon as possible: -allergic reactions like skin rash, itching or hives, swelling of the face, lips, or tongue -dizziness -fever -pain, redness, or irritation at site where injected -pinpoint red spots on the skin -red or dark-brown urine -shortness of breath or breathing problems -stomach or side pain, or pain   at the shoulder -swelling -tiredness -trouble passing urine or change in the amount of urine Side effects that usually do not require medical attention (report to your doctor or health care professional if they continue or are  bothersome): -bone pain -muscle pain This list may not describe all possible side effects. Call your doctor for medical advice about side effects. You may report side effects to FDA at 1-800-FDA-1088. Where should I keep my medicine? Keep out of the reach of children. Store pre-filled syringes in a refrigerator between 2 and 8 degrees C (36 and 46 degrees F). Do not freeze. Keep in carton to protect from light. Throw away this medicine if it is left out of the refrigerator for more than 48 hours. Throw away any unused medicine after the expiration date. NOTE: This sheet is a summary. It may not cover all possible information. If you have questions about this medicine, talk to your doctor, pharmacist, or health care provider.    2016, Elsevier/Gold Standard. (2014-06-08 14:30:14)  

## 2015-10-26 LAB — GUARDANT 360

## 2015-11-05 ENCOUNTER — Ambulatory Visit: Payer: Medicare Other

## 2015-11-05 ENCOUNTER — Other Ambulatory Visit (HOSPITAL_BASED_OUTPATIENT_CLINIC_OR_DEPARTMENT_OTHER): Payer: Medicare Other

## 2015-11-05 DIAGNOSIS — C179 Malignant neoplasm of small intestine, unspecified: Secondary | ICD-10-CM | POA: Diagnosis not present

## 2015-11-05 LAB — CBC WITH DIFFERENTIAL/PLATELET
BASO%: 0.4 % (ref 0.0–2.0)
Basophils Absolute: 0 10*3/uL (ref 0.0–0.1)
EOS ABS: 0.1 10*3/uL (ref 0.0–0.5)
EOS%: 1.2 % (ref 0.0–7.0)
HCT: 28 % — ABNORMAL LOW (ref 38.4–49.9)
HEMOGLOBIN: 8.7 g/dL — AB (ref 13.0–17.1)
LYMPH%: 25 % (ref 14.0–49.0)
MCH: 28.9 pg (ref 27.2–33.4)
MCHC: 31 g/dL — ABNORMAL LOW (ref 32.0–36.0)
MCV: 93.2 fL (ref 79.3–98.0)
MONO#: 0.9 10*3/uL (ref 0.1–0.9)
MONO%: 13.2 % (ref 0.0–14.0)
NEUT%: 60.2 % (ref 39.0–75.0)
NEUTROS ABS: 4.3 10*3/uL (ref 1.5–6.5)
Platelets: 136 10*3/uL — ABNORMAL LOW (ref 140–400)
RBC: 3.01 10*6/uL — ABNORMAL LOW (ref 4.20–5.82)
RDW: 20.3 % — AB (ref 11.0–14.6)
WBC: 7.1 10*3/uL (ref 4.0–10.3)
lymph#: 1.8 10*3/uL (ref 0.9–3.3)

## 2015-11-05 LAB — COMPREHENSIVE METABOLIC PANEL
ALBUMIN: 3.1 g/dL — AB (ref 3.5–5.0)
ALK PHOS: 104 U/L (ref 40–150)
ALT: 19 U/L (ref 0–55)
AST: 21 U/L (ref 5–34)
Anion Gap: 6 mEq/L (ref 3–11)
BUN: 9.2 mg/dL (ref 7.0–26.0)
CO2: 27 mEq/L (ref 22–29)
Calcium: 8.7 mg/dL (ref 8.4–10.4)
Chloride: 111 mEq/L — ABNORMAL HIGH (ref 98–109)
Creatinine: 1.1 mg/dL (ref 0.7–1.3)
EGFR: 68 mL/min/{1.73_m2} — ABNORMAL LOW (ref >90)
GLUCOSE: 81 mg/dL (ref 70–140)
Potassium: 3.5 mEq/L (ref 3.5–5.1)
SODIUM: 144 meq/L (ref 136–145)
TOTAL PROTEIN: 6.1 g/dL — AB (ref 6.4–8.3)

## 2015-11-06 ENCOUNTER — Ambulatory Visit (HOSPITAL_BASED_OUTPATIENT_CLINIC_OR_DEPARTMENT_OTHER): Payer: Medicare Other

## 2015-11-06 VITALS — BP 137/55 | HR 63 | Temp 98.3°F | Resp 16

## 2015-11-06 DIAGNOSIS — Z5111 Encounter for antineoplastic chemotherapy: Secondary | ICD-10-CM | POA: Diagnosis not present

## 2015-11-06 DIAGNOSIS — C179 Malignant neoplasm of small intestine, unspecified: Secondary | ICD-10-CM

## 2015-11-06 MED ORDER — SODIUM CHLORIDE 0.9 % IV SOLN
165.0000 mg/m2 | Freq: Once | INTRAVENOUS | Status: AC
  Administered 2015-11-06: 350 mg via INTRAVENOUS
  Filled 2015-11-06: qty 5.83

## 2015-11-06 MED ORDER — SODIUM CHLORIDE 0.9 % IV SOLN
Freq: Once | INTRAVENOUS | Status: AC
  Administered 2015-11-06: 09:00:00 via INTRAVENOUS

## 2015-11-06 MED ORDER — SODIUM CHLORIDE 0.9 % IV SOLN
Freq: Once | INTRAVENOUS | Status: AC
  Administered 2015-11-06: 09:00:00 via INTRAVENOUS
  Filled 2015-11-06: qty 8

## 2015-11-06 MED ORDER — SODIUM CHLORIDE 0.9 % IV SOLN
400.0000 mg/m2 | Freq: Once | INTRAVENOUS | Status: AC
  Administered 2015-11-06: 848 mg via INTRAVENOUS
  Filled 2015-11-06: qty 42.4

## 2015-11-06 MED ORDER — SODIUM CHLORIDE 0.9 % IV SOLN
2000.0000 mg/m2 | INTRAVENOUS | Status: DC
  Administered 2015-11-06: 4250 mg via INTRAVENOUS
  Filled 2015-11-06: qty 85

## 2015-11-06 MED ORDER — ATROPINE SULFATE 1 MG/ML IJ SOLN
INTRAMUSCULAR | Status: AC
  Filled 2015-11-06: qty 1

## 2015-11-06 MED ORDER — ATROPINE SULFATE 1 MG/ML IJ SOLN
0.5000 mg | Freq: Once | INTRAMUSCULAR | Status: AC | PRN
  Administered 2015-11-06: 0.5 mg via INTRAVENOUS

## 2015-11-06 NOTE — Patient Instructions (Signed)
[facility] Discharge Instructions for Patients Receiving Chemotherapy  Today you received the following chemotherapy agents:  Irinotecan, 5FU, and Leucovorin.  To help prevent nausea and vomiting after your treatment, we encourage you to take your nausea medication as prescribed.   If you develop nausea and vomiting that is not controlled by your nausea medication, call the clinic.   BELOW ARE SYMPTOMS THAT SHOULD BE REPORTED IMMEDIATELY:  *FEVER GREATER THAN 100.5 F  *CHILLS WITH OR WITHOUT FEVER  NAUSEA AND VOMITING THAT IS NOT CONTROLLED WITH YOUR NAUSEA MEDICATION  *UNUSUAL SHORTNESS OF BREATH  *UNUSUAL BRUISING OR BLEEDING  TENDERNESS IN MOUTH AND THROAT WITH OR WITHOUT PRESENCE OF ULCERS  *URINARY PROBLEMS  *BOWEL PROBLEMS  UNUSUAL RASH Items with * indicate a potential emergency and should be followed up as soon as possible.  Feel free to call the clinic you have any questions or concerns. The clinic phone number is [phone].  Please show the CHEMO ALERT CARD at check-in to the Emergency Department and triage nurse.   

## 2015-11-08 ENCOUNTER — Ambulatory Visit (HOSPITAL_BASED_OUTPATIENT_CLINIC_OR_DEPARTMENT_OTHER): Payer: Medicare Other

## 2015-11-08 VITALS — BP 133/63 | HR 57 | Temp 98.0°F | Resp 16

## 2015-11-08 DIAGNOSIS — C179 Malignant neoplasm of small intestine, unspecified: Secondary | ICD-10-CM | POA: Diagnosis not present

## 2015-11-08 DIAGNOSIS — Z5189 Encounter for other specified aftercare: Secondary | ICD-10-CM | POA: Diagnosis not present

## 2015-11-08 MED ORDER — HEPARIN SOD (PORK) LOCK FLUSH 100 UNIT/ML IV SOLN
500.0000 [IU] | Freq: Once | INTRAVENOUS | Status: AC | PRN
  Administered 2015-11-08: 500 [IU]
  Filled 2015-11-08: qty 5

## 2015-11-08 MED ORDER — SODIUM CHLORIDE 0.9 % IJ SOLN
10.0000 mL | INTRAMUSCULAR | Status: DC | PRN
  Administered 2015-11-08: 10 mL
  Filled 2015-11-08: qty 10

## 2015-11-08 MED ORDER — PEGFILGRASTIM INJECTION 6 MG/0.6ML ~~LOC~~
6.0000 mg | PREFILLED_SYRINGE | Freq: Once | SUBCUTANEOUS | Status: AC
  Administered 2015-11-08: 6 mg via SUBCUTANEOUS
  Filled 2015-11-08: qty 0.6

## 2015-11-14 ENCOUNTER — Other Ambulatory Visit: Payer: Self-pay | Admitting: Hematology

## 2015-11-26 ENCOUNTER — Other Ambulatory Visit (HOSPITAL_BASED_OUTPATIENT_CLINIC_OR_DEPARTMENT_OTHER): Payer: Medicare Other

## 2015-11-26 ENCOUNTER — Encounter (HOSPITAL_COMMUNITY): Payer: Self-pay

## 2015-11-26 ENCOUNTER — Ambulatory Visit (HOSPITAL_COMMUNITY)
Admission: RE | Admit: 2015-11-26 | Discharge: 2015-11-26 | Disposition: A | Discharge status: Home / Self Care | Payer: Medicare Other | Source: Ambulatory Visit | Attending: Hematology | Admitting: Hematology

## 2015-11-26 DIAGNOSIS — C179 Malignant neoplasm of small intestine, unspecified: Secondary | ICD-10-CM | POA: Insufficient documentation

## 2015-11-26 DIAGNOSIS — K769 Liver disease, unspecified: Secondary | ICD-10-CM | POA: Insufficient documentation

## 2015-11-26 DIAGNOSIS — R918 Other nonspecific abnormal finding of lung field: Secondary | ICD-10-CM | POA: Diagnosis not present

## 2015-11-26 DIAGNOSIS — I700 Atherosclerosis of aorta: Secondary | ICD-10-CM | POA: Diagnosis not present

## 2015-11-26 LAB — CBC WITH DIFFERENTIAL/PLATELET
BASO%: 0.5 % (ref 0.0–2.0)
BASOS ABS: 0 10*3/uL (ref 0.0–0.1)
EOS ABS: 0.1 10*3/uL (ref 0.0–0.5)
EOS%: 1.4 % (ref 0.0–7.0)
HCT: 28.7 % — ABNORMAL LOW (ref 38.4–49.9)
HEMOGLOBIN: 9 g/dL — AB (ref 13.0–17.1)
LYMPH%: 16.3 % (ref 14.0–49.0)
MCH: 28.3 pg (ref 27.2–33.4)
MCHC: 31.2 g/dL — ABNORMAL LOW (ref 32.0–36.0)
MCV: 90.6 fL (ref 79.3–98.0)
MONO#: 1.4 10*3/uL — AB (ref 0.1–0.9)
MONO%: 17.3 % — AB (ref 0.0–14.0)
NEUT#: 5.3 10*3/uL (ref 1.5–6.5)
NEUT%: 64.5 % (ref 39.0–75.0)
PLATELETS: 192 10*3/uL (ref 140–400)
RBC: 3.17 10*6/uL — AB (ref 4.20–5.82)
RDW: 19.5 % — ABNORMAL HIGH (ref 11.0–14.6)
WBC: 8.1 10*3/uL (ref 4.0–10.3)
lymph#: 1.3 10*3/uL (ref 0.9–3.3)

## 2015-11-26 LAB — COMPREHENSIVE METABOLIC PANEL
ALBUMIN: 3.2 g/dL — AB (ref 3.5–5.0)
ALK PHOS: 102 U/L (ref 40–150)
ALT: 20 U/L (ref 0–55)
ANION GAP: 8 meq/L (ref 3–11)
AST: 28 U/L (ref 5–34)
BUN: 9.9 mg/dL (ref 7.0–26.0)
CO2: 28 mEq/L (ref 22–29)
Calcium: 9 mg/dL (ref 8.4–10.4)
Chloride: 110 mEq/L — ABNORMAL HIGH (ref 98–109)
Creatinine: 1 mg/dL (ref 0.7–1.3)
EGFR: 77 mL/min/{1.73_m2} — AB (ref >90)
GLUCOSE: 82 mg/dL (ref 70–140)
POTASSIUM: 3.7 meq/L (ref 3.5–5.1)
SODIUM: 145 meq/L (ref 136–145)
Total Protein: 6.4 g/dL (ref 6.4–8.3)

## 2015-11-26 MED ORDER — IOPAMIDOL (ISOVUE-300) INJECTION 61%
100.0000 mL | Freq: Once | INTRAVENOUS | Status: AC | PRN
Start: 2015-11-26 — End: 2015-11-26
  Administered 2015-11-26: 100 mL via INTRAVENOUS

## 2015-11-27 ENCOUNTER — Ambulatory Visit: Payer: Medicare Other

## 2015-11-27 ENCOUNTER — Telehealth: Payer: Self-pay | Admitting: Hematology

## 2015-11-27 ENCOUNTER — Encounter: Payer: Self-pay | Admitting: Hematology

## 2015-11-27 ENCOUNTER — Ambulatory Visit (HOSPITAL_BASED_OUTPATIENT_CLINIC_OR_DEPARTMENT_OTHER): Payer: Medicare Other | Admitting: Hematology

## 2015-11-27 VITALS — BP 117/40 | HR 77 | Temp 97.9°F | Resp 18 | Ht 66.0 in | Wt 187.4 lb

## 2015-11-27 DIAGNOSIS — D6481 Anemia due to antineoplastic chemotherapy: Secondary | ICD-10-CM

## 2015-11-27 DIAGNOSIS — C179 Malignant neoplasm of small intestine, unspecified: Secondary | ICD-10-CM | POA: Diagnosis not present

## 2015-11-27 DIAGNOSIS — E119 Type 2 diabetes mellitus without complications: Secondary | ICD-10-CM

## 2015-11-27 DIAGNOSIS — C772 Secondary and unspecified malignant neoplasm of intra-abdominal lymph nodes: Secondary | ICD-10-CM

## 2015-11-27 DIAGNOSIS — D630 Anemia in neoplastic disease: Secondary | ICD-10-CM

## 2015-11-27 DIAGNOSIS — C787 Secondary malignant neoplasm of liver and intrahepatic bile duct: Secondary | ICD-10-CM | POA: Diagnosis not present

## 2015-11-27 DIAGNOSIS — D500 Iron deficiency anemia secondary to blood loss (chronic): Secondary | ICD-10-CM

## 2015-11-27 DIAGNOSIS — I10 Essential (primary) hypertension: Secondary | ICD-10-CM

## 2015-11-27 DIAGNOSIS — G620 Drug-induced polyneuropathy: Secondary | ICD-10-CM

## 2015-11-27 DIAGNOSIS — K922 Gastrointestinal hemorrhage, unspecified: Secondary | ICD-10-CM

## 2015-11-27 DIAGNOSIS — T451X5A Adverse effect of antineoplastic and immunosuppressive drugs, initial encounter: Secondary | ICD-10-CM

## 2015-11-27 LAB — CEA: CEA1: 153.5 ng/mL — AB (ref 0.0–4.7)

## 2015-11-27 NOTE — Telephone Encounter (Signed)
per pof to sch pt appt-cld & gave pt update sch for 7/5-pt understood

## 2015-11-27 NOTE — Telephone Encounter (Signed)
per pof to sch pt appt-sch trmt-gave pt copy of avs-pt state no need for flush with this trmt-no flush sch per pt req

## 2015-11-27 NOTE — Telephone Encounter (Signed)
Faxed pt medical records to [facility] ([facility]). [phone]

## 2015-11-27 NOTE — Progress Notes (Signed)
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    08/18/2014 Miscellaneous tumor KRAS mutation (-)   08/18/2014 Pathology Results Liver biopsy showed metastatic adenocarcinoma, consistent with GI primary   08/18/2014 Initial Diagnosis Small bowel cancer   08/18/2014 Procedure small bowel enteroscopy with biopsy by Dr. Benson Norway showed at the proximal jejunum there was evidence of abnormal mucosa and friability, biopsy was obtained.    09/05/2014 Imaging PET hypermetabolic mass involving a small bowel loop with adjacent mesenteric lymphadenopathy, and diffuse liver metastasis and mild hypermetabolic lymphadenopathy in porta hepatis.   09/06/2014 - 02/01/2015 Chemotherapy mFOLFOX, stopped due to his neuropathy, and earlier mild disease progression on PET    09/07/2014 - 09/11/2014 Hospital Admission He was admitted for fever and GI bleeding. Received 3 units of RBC.   10/26/2014 Imaging Interval significant improvement in the primary small bowel mass, adjacent lymphadenopathy and extensive hepatic metastatic disease. No other new lesions.    02/09/2015 Imaging PET/CT scan showed stable primary malignancy in the proximal jejunum, mild metabolic progression of the 3 residual metabolic liver metastasis. CT  Portion showed decreased size of his liver metastasis.   02/27/2015 -  Chemotherapy second line chemo FOLFIRI, every 2 weeks, and panitumumab (held for second cycle due to severe skin rashes)    08/06/2015 Imaging Stable number and size of the liver lesions. The left lower lobe nodule is considerably less prominent. No other new lesions.     HISTORY OF PRESENTING ILLNESS:  Tyler Evans 70 y.o. male is here because of a recent diagnosis of small bowel cancer. He presented to his PCP on 08/17/2014 with complaints of feeling weak and a one month history of a cough. He was found to be anemic and was referred to the emergency department. Hemoglobin was found to be 7.6, MCV 76.6; creatinine was elevated at 1.74. Stool was Hemoccult positive.  CT abdomen/pelvis without contrast on 08/17/2014 showed multiple large masses within the liver, bulky in appearance. The largest measured approximately 5.7 cm. There appeared to be a focal filling defect within the proximal jejunum measuring approximately 2.5 x 1.7 cm. There was mild adjacent jejunal wall thickening. The lung bases were clear.  On 08/18/2014 he underwent a small bowel enteroscopy with biopsy by Dr. Benson Norway. The esophagus and gastric lumen were normal. At the proximal jejunum there was evidence of abnormal mucosa and friability. The pediatric colonoscope was not able to traverse the area. Biopsies were obtained. An ultraslim colonoscope was then utilized. This colonoscope was able to traverse the area of stenosis which measured approximately 3 cm in length. 50% of the lumen was ulcerated. 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 MMR stains are pending.  He was discharged home on 08/19/2014.  CURRENT THERAPY: pending third line chemo with weekly gemcitabine   INTERIM HISTORY:  Mr. Caul returns for follow-up And discuss restaging CT scan results.  His doing well overall,tolerating chemotherapy very well. He has mild  fatigue, able to function very well at home, such as a doing yard work.His mild peripheral neuropathy stable,  No other new complaints. He has good appetite, weight is stable.  MEDICAL  HISTORY:  Past Medical History  Diagnosis Date  . Hypertension   . Small bowel cancer ([facility]) 08/18/2014  . Diabetes mellitus without complication ([facility])     SURGICAL HISTORY: Past Surgical History  Procedure Laterality Date  . Enteroscopy N/A 08/18/2014    Procedure: ENTEROSCOPY;  Surgeon: Jeani Hawking, MD;  Location: [facility];  Service: Endoscopy;  Laterality: N/A;    SOCIAL HISTORY: History   Social History  . Marital Status: Married    Spouse Name: N/A  . Number of Children: 2  . Years of Education: N/A   Occupational History  .  retired Financial risk analyst    Social History Main Topics  . Smoking status: Never Smoker   . Smokeless tobacco: Not on file  . Alcohol Use: No  . Drug Use: Not on file  . Sexual Activity: Not on file   Other Topics Concern  . Not on file   Social History Narrative  He lives in [place]. He is married. He has 2 children both reported to be in good health. No tobacco or alcohol use. 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  FAMILY HISTORY: Family History  Problem Relation Age of Onset  . Heart failure, Alzheimer's  Mother   . Lung cancer Father   . Kidney cancer Brother     ALLERGIES:  is allergic to penicillins.  MEDICATIONS:  Current Outpatient Prescriptions on File Prior to Visit  Medication Sig Dispense Refill  . atorvastatin (LIPITOR) 20 MG tablet Take 20 mg by mouth daily.    . Blood Glucose Monitoring Suppl (ONE TOUCH ULTRA MINI) W/DEVICE KIT     . clindamycin (CLINDAGEL) 1 % gel Apply topically 2 (two) times daily. 30 g 2  . gabapentin (NEURONTIN) 100 MG capsule TAKE 2 CAPSULES BY MOUTH 3 TIMES A DAY 180 capsule 1  . hydrocortisone 2.5 % cream Apply topically 2 (two) times daily. 30 g 2  . LANTUS SOLOSTAR 100 UNIT/ML Solostar Pen     . lidocaine-prilocaine (EMLA) cream APPLY TO AFFECTED AREA ONCE  3  . lidocaine-prilocaine (EMLA) cream Apply topically as needed. Apply to portacath 1 1/2 hours - 2 hours prior to procedures as needed.  30 g 2  . lisinopril (PRINIVIL,ZESTRIL) 10 MG tablet Take 10 mg by mouth daily.  6  . ONE TOUCH ULTRA TEST test strip     . PRESCRIPTION MEDICATION See admin instructions. Receives Vectibix, Leucovorin, and Adrucil chemotherapy every 2 weeks.    . tamsulosin (FLOMAX) 0.4 MG CAPS capsule Take 0.4 mg by mouth daily.  12   No current facility-administered medications on file prior to visit.  ;  REVIEW OF SYSTEMS:   Constitutional: Denies fevers, chills or abnormal night sweats; mild fatigue, weight is stable.  Eyes: Denies blurriness of vision, double vision or watery eyes Ears, nose, mouth, throat, and face: Denies mucositis or sore throat Respiratory: One month history of a nonproductive cough;  no shortness of breath Cardiovascular: Denies palpitation, chest discomfort or lower extremity swelling Gastrointestinal:  Denies nausea, heartburn. No dysphagia. He has noted less frequent bowel movements over the past 2 weeks. Does not characterize this as constipation. Skin: Denies abnormal skin rashes Lymphatics: Denies new lymphadenopathy or easy bruising Neurological: Denies numbness, tingling. No extremity weakness Behavioral/Psych: Mood is stable, no new changes  All  other systems were reviewed with the patient and are negative.  PHYSICAL EXAMINATION: ECOG PERFORMANCE STATUS: 1  Filed Vitals:   11/27/15 0832  BP: 117/40  Pulse: 77  Temp: 97.9 F (36.6 C)  Resp: 18   Filed Weights   11/27/15 0832  Weight: 187 lb 6.4 oz (85.004 kg)    GENERAL:alert, no distress and comfortable SKIN: skin color, texture, turgor are normal, (+) skin pigmentation from his rash on his face, neck, chest and upper abdomen.  EYES: normal, conjunctiva are pink and non-injected, sclera clear OROPHARYNX:no exudate, no erythema and lips, buccal mucosa, and tongue normal  NECK: supple, thyroid normal size, non-tender, without nodularity LYMPH:  no palpable lymphadenopathy in the cervical, axillary or  inguinal regions LUNGS: clear to auscultation and percussion with normal breathing effort HEART: regular rate & rhythm and no murmurs and no lower extremity edema ABDOMEN:abdomen soft, mild tenderness at the right upper quadrant , no rebound pain, normal bowel sounds Musculoskeletal:no cyanosis of digits and no clubbing  PSYCH: alert & oriented x 3 with fluent speech NEURO: no focal motor deficits, his light touch sensation and vibration sensation are diminished on his hands and feet.   LABORATORY DATA:  I have reviewed the data as listed CBC Latest Ref Rng 11/26/2015 11/05/2015 10/22/2015  WBC 4.0 - 10.3 10e3/uL 8.1 7.1 6.6  Hemoglobin 13.0 - 17.1 g/dL 9.0(L) 8.7(L) 8.7(L)  Hematocrit 38.4 - 49.9 % 28.7(L) 28.0(L) 28.0(L)  Platelets 140 - 400 10e3/uL 192 136(L) 156    CMP Latest Ref Rng 11/26/2015 11/05/2015 10/22/2015  Glucose 70 - 140 mg/dl 82 81 139  BUN 7.0 - 26.0 mg/dL 9.9 9.2 8.8  Creatinine 0.7 - 1.3 mg/dL 1.0 1.1 1.1  Sodium 136 - 145 mEq/L 145 144 143  Potassium 3.5 - 5.1 mEq/L 3.7 3.5 4.1  CO2 22 - 29 mEq/L '28 27 26  '$ Calcium 8.4 - 10.4 mg/dL 9.0 8.7 8.7  Total Protein 6.4 - 8.3 g/dL 6.4 6.1(L) 6.1(L)  Total Bilirubin 0.20 - 1.20 mg/dL <0.30 <0.30 <0.30  Alkaline Phos 40 - 150 U/L 102 104 103  AST 5 - 34 U/L '28 21 20  '$ ALT 0 - 55 U/L '20 19 17   '$ Results for JAKYLAN, RON (MRN [id number]) as of 10/22/2015 07:11  Ref. Range 05/29/2015 08:20 06/25/2015 11:24 07/09/2015 13:43 08/06/2015 09:20 09/24/2015 07:51  CEA Latest Units: ng/mL 17.6 (H) 20.2 (H) 25.0    CEA Latest Ref Range: 0.0-4.7 ng/mL  33.6 (H) 41.0 (H) 50.5 (H) 74.6 (H)    Pathology report:  Small Intestine Biopsy, jejunal mass 08/18/2014 - INVASIVE ADENOCARCINOMA IN A BACKGROUND OF TUBULOVILLOUS ADENOMA. ADDITIONAL INFORMATION: Mismatch Repair (MMR) Protein Immunohistochemistry (IHC) IHC Expression Result (LIMITED TUMOR): MLH1: Preserved nuclear expression (greater 50% tumor expression) MSH2: Preserved nuclear  expression (greater 50% tumor expression) MSH6: Preserved nuclear expression (greater 50% tumor expression) PMS2: Preserved nuclear expression (greater 50% tumor expression) * Internal control demonstrates intact nuclear expression Interpretation: NORMAL There is preserved expression  Diagnosis 08/28/2014  Liver, needle/core biopsy, right lobe - METASTATIC ADENOCARCINOMA   RADIOGRAPHIC STUDIES: I have personally reviewed the radiological images as listed and agreed with the findings in the report.   CT chest, abdomen and pelvis with contrast 11/26/2015 IMPRESSION: 1. Interval increase in size of liver lesions. 2. Multi focal pulmonary nodules are not significantly changed in the interval. 3. Aortic atherosclerosis.    ASSESSMENT & PLAN:  70 year old gentleman with past medical history of diabetes, hypertension, who presents with anemia and  weakness.  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  -Unfortunately he has stage IV disease with diffuse liver metastasis, this is an incurable disease, with overall very poor prognosis. -I reviewed his staging CT scan results from 11/26/2015 with patient and his wife, Unfortunately scan showed disease progression in the liver, his tumor marker CA has been trending up lately, which is consistent with disease progression. -I previously discussed his guardant 360 number testing results, which showed mutations in KRAS, RAF 1, APC, TP 53,  Targeted therapy is nt available at this point. - I recommend him to stop for file due to disease progression. We discussed third line treatment options, including single agent chemotherapy, such as gemcitabine, Xeloda, or clinical trial.  Due to the rarity of the small bowel cancer,there is no large clinical trial data available. Retrospective data  have shown to efficacy of gemcitabine (Am J Clin Oncol. 2006;29(3):225. ),  Which I recommend him to tryas Treatment. He has been on 5-FU based therapy for the past 14 months,I think the response rate of  Xeloda will be very below.  - I encouraged him to consider clinical trials,  Likely early phase studies. He is interested. I'll look for clinical trial options for him the other institutions.  - after lengthy discussion, patient agrees to try single agent gemcitabine. I recommend gemcitabine1000 mg/m weekly, 2-3 weeks on, one-week off, depends on his tolerance and blood counts. --Chemotherapy consent: Side effects including but does not not limited to, fatigue, nausea, vomiting, diarrhea, hair loss, neuropathy, fluid retention, renal and kidney dysfunction, neutropenic fever, needed for blood transfusion, bleeding, were discussed with patient in great detail. She agrees to proceed. -We'll start gemcitabine next week.  2. Microcytic anemia secondary to GI bleeding, iron deficiency and chemotherapy -His serum iron level and saturation were low, although ferritin is normal, he has some degree of iron deficient anemia, and anemia of malignancy  -Consider blood transfusion if hemoglobin less than 7.5 or symptomatic anemia -he received IV feraheme 510 mg twice in 09/2014, repeated iron study was normal on 07/23/2015 -He has restarted oral iron pill, and his anemia is slightly better today   3.  Hypertension and DM  -He will continue follow-up with his primary care physician. -We discussed that we will monitor his blood pressure and blood sugar closely and may need to adjust his medication during the chemotherapy  -His diabetes has been better controlled since his insulin was adjusted  4. Peripheral neuropathy, secondary to chemotherapy, G2 -Overall improved. Stable lately. -Continue Neurontin  200 mg 3 times a day -We'll continue observation.   Plan: -Restaging CT scan results reviewed with  patient.We'll stop the FOLFIRI due to disease progression -start weekly gemcitabine 1000 mg/m, 2-3 weeks on, one-week off, from next week - I'll see him back in 2 weeks for follow-up.   Truitt Merle  11/27/2015

## 2015-12-05 ENCOUNTER — Ambulatory Visit (HOSPITAL_BASED_OUTPATIENT_CLINIC_OR_DEPARTMENT_OTHER): Payer: Medicare Other

## 2015-12-05 ENCOUNTER — Other Ambulatory Visit: Payer: Medicare Other

## 2015-12-05 ENCOUNTER — Other Ambulatory Visit (HOSPITAL_BASED_OUTPATIENT_CLINIC_OR_DEPARTMENT_OTHER): Payer: Medicare Other

## 2015-12-05 ENCOUNTER — Ambulatory Visit: Payer: Medicare Other

## 2015-12-05 VITALS — BP 117/48 | HR 57 | Temp 98.0°F | Resp 18

## 2015-12-05 DIAGNOSIS — Z95828 Presence of other vascular implants and grafts: Secondary | ICD-10-CM | POA: Insufficient documentation

## 2015-12-05 DIAGNOSIS — C787 Secondary malignant neoplasm of liver and intrahepatic bile duct: Secondary | ICD-10-CM

## 2015-12-05 DIAGNOSIS — C179 Malignant neoplasm of small intestine, unspecified: Secondary | ICD-10-CM

## 2015-12-05 DIAGNOSIS — Z5111 Encounter for antineoplastic chemotherapy: Secondary | ICD-10-CM | POA: Diagnosis not present

## 2015-12-05 DIAGNOSIS — C772 Secondary and unspecified malignant neoplasm of intra-abdominal lymph nodes: Secondary | ICD-10-CM | POA: Diagnosis not present

## 2015-12-05 LAB — COMPREHENSIVE METABOLIC PANEL
ALT: 15 U/L (ref 0–55)
ANION GAP: 7 meq/L (ref 3–11)
AST: 22 U/L (ref 5–34)
Albumin: 3 g/dL — ABNORMAL LOW (ref 3.5–5.0)
Alkaline Phosphatase: 97 U/L (ref 40–150)
BUN: 15.6 mg/dL (ref 7.0–26.0)
CHLORIDE: 108 meq/L (ref 98–109)
CO2: 26 meq/L (ref 22–29)
Calcium: 8.6 mg/dL (ref 8.4–10.4)
Creatinine: 1.1 mg/dL (ref 0.7–1.3)
EGFR: 64 mL/min/{1.73_m2} — AB (ref >90)
GLUCOSE: 263 mg/dL — AB (ref 70–140)
POTASSIUM: 3.8 meq/L (ref 3.5–5.1)
SODIUM: 140 meq/L (ref 136–145)
Total Protein: 6.1 g/dL — ABNORMAL LOW (ref 6.4–8.3)

## 2015-12-05 LAB — CBC WITH DIFFERENTIAL/PLATELET
BASO%: 0.6 % (ref 0.0–2.0)
Basophils Absolute: 0 10*3/uL (ref 0.0–0.1)
EOS ABS: 0.1 10*3/uL (ref 0.0–0.5)
EOS%: 0.8 % (ref 0.0–7.0)
HCT: 26.7 % — ABNORMAL LOW (ref 38.4–49.9)
HGB: 8.3 g/dL — ABNORMAL LOW (ref 13.0–17.1)
LYMPH%: 14.5 % (ref 14.0–49.0)
MCH: 27.8 pg (ref 27.2–33.4)
MCHC: 30.9 g/dL — AB (ref 32.0–36.0)
MCV: 89.8 fL (ref 79.3–98.0)
MONO#: 0.9 10*3/uL (ref 0.1–0.9)
MONO%: 14.1 % — AB (ref 0.0–14.0)
NEUT%: 70 % (ref 39.0–75.0)
NEUTROS ABS: 4.6 10*3/uL (ref 1.5–6.5)
PLATELETS: 142 10*3/uL (ref 140–400)
RBC: 2.97 10*6/uL — AB (ref 4.20–5.82)
RDW: 19.1 % — ABNORMAL HIGH (ref 11.0–14.6)
WBC: 6.6 10*3/uL (ref 4.0–10.3)
lymph#: 1 10*3/uL (ref 0.9–3.3)

## 2015-12-05 MED ORDER — SODIUM CHLORIDE 0.9% FLUSH
10.0000 mL | INTRAVENOUS | Status: DC | PRN
  Administered 2015-12-05: 10 mL
  Filled 2015-12-05: qty 10

## 2015-12-05 MED ORDER — SODIUM CHLORIDE 0.9 % IV SOLN
Freq: Once | INTRAVENOUS | Status: AC
  Administered 2015-12-05: 14:00:00 via INTRAVENOUS

## 2015-12-05 MED ORDER — PROCHLORPERAZINE MALEATE 10 MG PO TABS
10.0000 mg | ORAL_TABLET | Freq: Once | ORAL | Status: AC
  Administered 2015-12-05: 10 mg via ORAL

## 2015-12-05 MED ORDER — SODIUM CHLORIDE 0.9 % IJ SOLN
10.0000 mL | INTRAMUSCULAR | Status: DC | PRN
  Administered 2015-12-05: 10 mL via INTRAVENOUS
  Filled 2015-12-05: qty 10

## 2015-12-05 MED ORDER — SODIUM CHLORIDE 0.9 % IV SOLN
2000.0000 mg | Freq: Once | INTRAVENOUS | Status: AC
  Administered 2015-12-05: 2000 mg via INTRAVENOUS
  Filled 2015-12-05: qty 52.6

## 2015-12-05 MED ORDER — PROCHLORPERAZINE MALEATE 10 MG PO TABS
ORAL_TABLET | ORAL | Status: AC
  Filled 2015-12-05: qty 1

## 2015-12-05 MED ORDER — HEPARIN SOD (PORK) LOCK FLUSH 100 UNIT/ML IV SOLN
500.0000 [IU] | Freq: Once | INTRAVENOUS | Status: AC | PRN
  Administered 2015-12-05: 500 [IU]
  Filled 2015-12-05: qty 5

## 2015-12-05 NOTE — Patient Instructions (Signed)

## 2015-12-05 NOTE — Patient Instructions (Addendum)
[facility] Discharge Instructions for Patients Receiving Chemotherapy  Today you received the following chemotherapy agents:  Gemzar.  To help prevent nausea and vomiting after your treatment, we encourage you to take your nausea medication as directed.   If you develop nausea and vomiting that is not controlled by your nausea medication, call the clinic.   BELOW ARE SYMPTOMS THAT SHOULD BE REPORTED IMMEDIATELY:  *FEVER GREATER THAN 100.5 F  *CHILLS WITH OR WITHOUT FEVER  NAUSEA AND VOMITING THAT IS NOT CONTROLLED WITH YOUR NAUSEA MEDICATION  *UNUSUAL SHORTNESS OF BREATH  *UNUSUAL BRUISING OR BLEEDING  TENDERNESS IN MOUTH AND THROAT WITH OR WITHOUT PRESENCE OF ULCERS  *URINARY PROBLEMS  *BOWEL PROBLEMS  UNUSUAL RASH Items with * indicate a potential emergency and should be followed up as soon as possible.  Feel free to call the clinic you have any questions or concerns. The clinic phone number is [phone].  Please show the CHEMO ALERT CARD at check-in to the Emergency Department and triage nurse.  Gemcitabine injection What is this medicine? GEMCITABINE (jem SIT a been) is a chemotherapy drug. This medicine is used to treat many types of cancer like breast cancer, lung cancer, pancreatic cancer, and ovarian cancer. This medicine may be used for other purposes; ask your health care provider or pharmacist if you have questions. What should I tell my health care provider before I take this medicine? They need to know if you have any of these conditions: -blood disorders -infection -kidney disease -liver disease -recent or ongoing radiation therapy -an unusual or allergic reaction to gemcitabine, other chemotherapy, other medicines, foods, dyes, or preservatives -pregnant or trying to get pregnant -breast-feeding How should I use this medicine? This drug is given as an infusion into a vein. It is administered in a hospital or clinic by a specially  trained health care professional. Talk to your pediatrician regarding the use of this medicine in children. Special care may be needed. Overdosage: If you think you have taken too much of this medicine contact a poison control center or emergency room at once. NOTE: This medicine is only for you. Do not share this medicine with others. What if I miss a dose? It is important not to miss your dose. Call your doctor or health care professional if you are unable to keep an appointment. What may interact with this medicine? -medicines to increase blood counts like filgrastim, pegfilgrastim, sargramostim -some other chemotherapy drugs like cisplatin -vaccines Talk to your doctor or health care professional before taking any of these medicines: -acetaminophen -aspirin -ibuprofen -ketoprofen -naproxen This list may not describe all possible interactions. Give your health care provider a list of all the medicines, herbs, non-prescription drugs, or dietary supplements you use. Also tell them if you smoke, drink alcohol, or use illegal drugs. Some items may interact with your medicine. What should I watch for while using this medicine? Visit your doctor for checks on your progress. This drug may make you feel generally unwell. This is not uncommon, as chemotherapy can affect healthy cells as well as cancer cells. Report any side effects. Continue your course of treatment even though you feel ill unless your doctor tells you to stop. In some cases, you may be given additional medicines to help with side effects. Follow all directions for their use. Call your doctor or health care professional for advice if you get a fever, chills or sore throat, or other symptoms of a cold or flu. Do not treat   yourself. This drug decreases your body's ability to fight infections. Try to avoid being around people who are sick. This medicine may increase your risk to bruise or bleed. Call your doctor or health care  professional if you notice any unusual bleeding. Be careful brushing and flossing your teeth or using a toothpick because you may get an infection or bleed more easily. If you have any dental work done, tell your dentist you are receiving this medicine. Avoid taking products that contain aspirin, acetaminophen, ibuprofen, naproxen, or ketoprofen unless instructed by your doctor. These medicines may hide a fever. Women should inform their doctor if they wish to become pregnant or think they might be pregnant. There is a potential for serious side effects to an unborn child. Talk to your health care professional or pharmacist for more information. Do not breast-feed an infant while taking this medicine. What side effects may I notice from receiving this medicine? Side effects that you should report to your doctor or health care professional as soon as possible: -allergic reactions like skin rash, itching or hives, swelling of the face, lips, or tongue -low blood counts - this medicine may decrease the number of white blood cells, red blood cells and platelets. You may be at increased risk for infections and bleeding. -signs of infection - fever or chills, cough, sore throat, pain or difficulty passing urine -signs of decreased platelets or bleeding - bruising, pinpoint red spots on the skin, black, tarry stools, blood in the urine -signs of decreased red blood cells - unusually weak or tired, fainting spells, lightheadedness -breathing problems -chest pain -mouth sores -nausea and vomiting -pain, swelling, redness at site where injected -pain, tingling, numbness in the hands or feet -stomach pain -swelling of ankles, feet, hands -unusual bleeding Side effects that usually do not require medical attention (report to your doctor or health care professional if they continue or are bothersome): -constipation -diarrhea -hair loss -loss of appetite -stomach upset This list may not describe all  possible side effects. Call your doctor for medical advice about side effects. You may report side effects to FDA at 1-800-FDA-1088. Where should I keep my medicine? This drug is given in a hospital or clinic and will not be stored at home. NOTE: This sheet is a summary. It may not cover all possible information. If you have questions about this medicine, talk to your doctor, pharmacist, or health care provider.    2016, Elsevier/Gold Standard. (2007-09-28 18:45:54)  

## 2015-12-06 ENCOUNTER — Encounter: Payer: Self-pay | Admitting: *Deleted

## 2015-12-12 ENCOUNTER — Ambulatory Visit: Payer: Medicare Other

## 2015-12-12 ENCOUNTER — Telehealth: Payer: Self-pay | Admitting: Hematology

## 2015-12-12 ENCOUNTER — Ambulatory Visit (HOSPITAL_BASED_OUTPATIENT_CLINIC_OR_DEPARTMENT_OTHER): Payer: Medicare Other | Admitting: Hematology

## 2015-12-12 ENCOUNTER — Other Ambulatory Visit (HOSPITAL_BASED_OUTPATIENT_CLINIC_OR_DEPARTMENT_OTHER): Payer: Medicare Other

## 2015-12-12 ENCOUNTER — Encounter: Payer: Self-pay | Admitting: Hematology

## 2015-12-12 VITALS — BP 130/48 | HR 62 | Temp 98.0°F | Resp 18 | Ht 66.0 in | Wt 185.9 lb

## 2015-12-12 DIAGNOSIS — G622 Polyneuropathy due to other toxic agents: Secondary | ICD-10-CM

## 2015-12-12 DIAGNOSIS — C179 Malignant neoplasm of small intestine, unspecified: Secondary | ICD-10-CM

## 2015-12-12 DIAGNOSIS — D6481 Anemia due to antineoplastic chemotherapy: Secondary | ICD-10-CM | POA: Diagnosis not present

## 2015-12-12 DIAGNOSIS — C787 Secondary malignant neoplasm of liver and intrahepatic bile duct: Secondary | ICD-10-CM

## 2015-12-12 DIAGNOSIS — D696 Thrombocytopenia, unspecified: Secondary | ICD-10-CM

## 2015-12-12 DIAGNOSIS — D509 Iron deficiency anemia, unspecified: Secondary | ICD-10-CM

## 2015-12-12 DIAGNOSIS — G620 Drug-induced polyneuropathy: Secondary | ICD-10-CM

## 2015-12-12 DIAGNOSIS — C772 Secondary and unspecified malignant neoplasm of intra-abdominal lymph nodes: Secondary | ICD-10-CM | POA: Diagnosis not present

## 2015-12-12 DIAGNOSIS — E119 Type 2 diabetes mellitus without complications: Secondary | ICD-10-CM

## 2015-12-12 DIAGNOSIS — I10 Essential (primary) hypertension: Secondary | ICD-10-CM

## 2015-12-12 DIAGNOSIS — T451X5A Adverse effect of antineoplastic and immunosuppressive drugs, initial encounter: Secondary | ICD-10-CM

## 2015-12-12 DIAGNOSIS — D500 Iron deficiency anemia secondary to blood loss (chronic): Secondary | ICD-10-CM

## 2015-12-12 DIAGNOSIS — D630 Anemia in neoplastic disease: Secondary | ICD-10-CM

## 2015-12-12 LAB — CBC WITH DIFFERENTIAL/PLATELET
BASO%: 0.4 % (ref 0.0–2.0)
Basophils Absolute: 0 10*3/uL (ref 0.0–0.1)
EOS%: 1.2 % (ref 0.0–7.0)
Eosinophils Absolute: 0.1 10*3/uL (ref 0.0–0.5)
HCT: 24.5 % — ABNORMAL LOW (ref 38.4–49.9)
HGB: 7.5 g/dL — ABNORMAL LOW (ref 13.0–17.1)
LYMPH%: 25.7 % (ref 14.0–49.0)
MCH: 27.3 pg (ref 27.2–33.4)
MCHC: 30.6 g/dL — AB (ref 32.0–36.0)
MCV: 89.1 fL (ref 79.3–98.0)
MONO#: 0.2 10*3/uL (ref 0.1–0.9)
MONO%: 4.4 % (ref 0.0–14.0)
NEUT%: 68.3 % (ref 39.0–75.0)
NEUTROS ABS: 3.5 10*3/uL (ref 1.5–6.5)
PLATELETS: 61 10*3/uL — AB (ref 140–400)
RBC: 2.75 10*6/uL — AB (ref 4.20–5.82)
RDW: 17.9 % — ABNORMAL HIGH (ref 11.0–14.6)
WBC: 5.1 10*3/uL (ref 4.0–10.3)
lymph#: 1.3 10*3/uL (ref 0.9–3.3)
nRBC: 0 % (ref 0–0)

## 2015-12-12 LAB — COMPREHENSIVE METABOLIC PANEL
ALT: 28 U/L (ref 0–55)
ANION GAP: 8 meq/L (ref 3–11)
AST: 31 U/L (ref 5–34)
Albumin: 3 g/dL — ABNORMAL LOW (ref 3.5–5.0)
Alkaline Phosphatase: 116 U/L (ref 40–150)
BUN: 16.1 mg/dL (ref 7.0–26.0)
CO2: 25 mEq/L (ref 22–29)
CREATININE: 1 mg/dL (ref 0.7–1.3)
Calcium: 8.8 mg/dL (ref 8.4–10.4)
Chloride: 110 mEq/L — ABNORMAL HIGH (ref 98–109)
EGFR: 79 mL/min/{1.73_m2} — AB (ref >90)
Glucose: 135 mg/dl (ref 70–140)
Potassium: 3.8 mEq/L (ref 3.5–5.1)
Sodium: 143 mEq/L (ref 136–145)
Total Protein: 6.1 g/dL — ABNORMAL LOW (ref 6.4–8.3)

## 2015-12-12 MED ORDER — PROCHLORPERAZINE MALEATE 10 MG PO TABS
ORAL_TABLET | ORAL | Status: AC
  Filled 2015-12-12: qty 1

## 2015-12-12 MED ORDER — PROCHLORPERAZINE EDISYLATE 5 MG/ML IJ SOLN
INTRAMUSCULAR | Status: AC
  Filled 2015-12-12: qty 2

## 2015-12-12 NOTE — Progress Notes (Signed)
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    08/18/2014 Miscellaneous tumor KRAS mutation (-)   08/18/2014 Pathology Results Liver biopsy showed metastatic adenocarcinoma, consistent with GI primary   08/18/2014 Initial Diagnosis Small bowel cancer   08/18/2014 Procedure small bowel enteroscopy with biopsy by Dr. Benson Norway showed at the proximal jejunum there was evidence of abnormal mucosa and friability, biopsy was obtained.    09/05/2014 Imaging PET hypermetabolic mass involving a small bowel loop with adjacent mesenteric lymphadenopathy, and diffuse liver metastasis and mild hypermetabolic lymphadenopathy in porta hepatis.   09/06/2014 - 02/01/2015 Chemotherapy mFOLFOX, stopped due to his neuropathy, and earlier mild disease progression on PET    09/07/2014 - 09/11/2014 Hospital Admission He was admitted for fever and GI bleeding. Received 3 units of RBC.   10/26/2014 Imaging Interval significant improvement in the primary small bowel mass, adjacent lymphadenopathy and extensive hepatic metastatic disease. No other new lesions.    02/09/2015 Imaging PET/CT scan showed stable primary malignancy in the proximal jejunum, mild metabolic progression of the 3 residual metabolic liver metastasis. CT  Portion showed decreased size of his liver metastasis.   02/27/2015 -  Chemotherapy second line chemo FOLFIRI, every 2 weeks, and panitumumab (held for second cycle due to severe skin rashes)    08/06/2015 Imaging Stable number and size of the liver lesions. The left lower lobe nodule is considerably less prominent. No other new lesions.     HISTORY OF PRESENTING ILLNESS:  Tyler Evans 70 y.o. male is here because of a recent diagnosis of small bowel cancer. He presented to his PCP on 08/17/2014 with complaints of feeling weak and a one month history of a cough. He was found to be anemic and was referred to the emergency department. Hemoglobin was found to be 7.6, MCV 76.6; creatinine was elevated at 1.74. Stool was Hemoccult positive.  CT abdomen/pelvis without contrast on 08/17/2014 showed multiple large masses within the liver, bulky in appearance. The largest measured approximately 5.7 cm. There appeared to be a focal filling defect within the proximal jejunum measuring approximately 2.5 x 1.7 cm. There was mild adjacent jejunal wall thickening. The lung bases were clear.  On 08/18/2014 he underwent a small bowel enteroscopy with biopsy by Dr. Benson Norway. The esophagus and gastric lumen were normal. At the proximal jejunum there was evidence of abnormal mucosa and friability. The pediatric colonoscope was not able to traverse the area. Biopsies were obtained. An ultraslim colonoscope was then utilized. This colonoscope was able to traverse the area of stenosis which measured approximately 3 cm in length. 50% of the lumen was ulcerated. 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 MMR stains are pending.  He was discharged home on 08/19/2014.  CURRENT THERAPY: third line chemo with weekly gemcitabine, '100mg'$ /m2 (reduced to '800mg'$ /m2 from second dose due to cytopenia), 2 weeks on and one week off   INTERIM HISTORY:  Tyler Evans returns for follow-up and  second week chemo. He tolerated the first week of gemcitabine very well, no significant fatigue, nausea, or other complaints. He has moderate fatigue, stable, he overall feels no change in the past 2 weeks.  No fever or chills, no bleeding.  MEDICAL HISTORY:  Past Medical History  Diagnosis Date  . Hypertension   . Small bowel cancer ([place]) 08/18/2014  . Diabetes mellitus without complication ([place])     SURGICAL HISTORY: Past Surgical History  Procedure Laterality Date  . Enteroscopy N/A 08/18/2014    Procedure: ENTEROSCOPY;  Surgeon: Carol Ada, MD;  Location: [facility];  Service: Endoscopy;  Laterality: N/A;    SOCIAL HISTORY: History   Social History  . Marital Status: Married    Spouse Name: N/A  . Number of Children: 2  . Years of Education: N/A   Occupational History  .  retired Ambulance person    Social History Main Topics  . Smoking status: Never Smoker   . Smokeless tobacco: Not on file  . Alcohol Use: No  . Drug Use: Not on file  . Sexual Activity: Not on file   Other Topics Concern  . Not on file   Social History Narrative  He lives in [place]. He is married. He has 2 children both reported to be in good health. No tobacco or alcohol use. He is a retired Ambulance person.  FAMILY HISTORY: Family History  Problem Relation Age of Onset  . Heart failure, Alzheimer's  Mother   . Lung cancer Father   . Kidney cancer Brother     ALLERGIES:  is allergic to penicillins.  MEDICATIONS:  Current Outpatient Prescriptions on File Prior to Visit  Medication Sig Dispense Refill  . atorvastatin (LIPITOR) 20 MG tablet Take 20 mg by mouth daily.    . Blood Glucose Monitoring Suppl (ONE TOUCH ULTRA MINI) W/DEVICE KIT     . clindamycin (CLINDAGEL) 1 % gel Apply topically 2 (two) times daily. 30 g 2  . gabapentin (NEURONTIN) 100 MG capsule TAKE 2 CAPSULES BY MOUTH 3 TIMES A DAY 180 capsule 1  . hydrocortisone 2.5 % cream Apply topically 2 (two) times daily. 30 g 2  . LANTUS SOLOSTAR 100 UNIT/ML Solostar Pen     . lidocaine-prilocaine (EMLA) cream Apply topically as needed. Apply to portacath 1 1/2 hours - 2 hours prior to procedures as needed. 30 g 2  . lisinopril  (PRINIVIL,ZESTRIL) 10 MG tablet Take 10 mg by mouth daily.  6  . ONE TOUCH ULTRA TEST test strip     . PRESCRIPTION MEDICATION See admin instructions. Receives Vectibix, Leucovorin, and Adrucil chemotherapy every 2 weeks.    . tamsulosin (FLOMAX) 0.4 MG CAPS capsule Take 0.4 mg by mouth daily.  12   No current facility-administered medications on file prior to visit.  ;  REVIEW OF SYSTEMS:   Constitutional: Denies fevers, chills or abnormal night sweats; mild fatigue, weight is stable.  Eyes: Denies blurriness of vision, double vision or watery eyes Ears, nose, mouth, throat, and face: Denies mucositis or sore throat Respiratory: One month history of a nonproductive cough;  no shortness of breath Cardiovascular: Denies palpitation, chest discomfort or lower extremity swelling Gastrointestinal:  Denies nausea, heartburn. No dysphagia. He has noted less frequent bowel movements over the past 2 weeks. Does not characterize this as constipation. Skin: Denies abnormal skin rashes Lymphatics: Denies new lymphadenopathy or easy bruising Neurological: Denies numbness, tingling. No extremity weakness Behavioral/Psych: Mood is stable, no new changes  All other systems  were reviewed with the patient and are negative.  PHYSICAL EXAMINATION: ECOG PERFORMANCE STATUS: 1  Filed Vitals:   12/12/15 0936  BP: 130/48  Pulse: 62  Temp: 98 F (36.7 C)  Resp: 18   Filed Weights   12/12/15 0936  Weight: 185 lb 14.4 oz (84.324 kg)    GENERAL:alert, no distress and comfortable SKIN: skin color, texture, turgor are normal, (+) skin pigmentation from his rash on his face, neck, chest and upper abdomen.  EYES: normal, conjunctiva are pink and non-injected, sclera clear OROPHARYNX:no exudate, no erythema and lips, buccal mucosa, and tongue normal  NECK: supple, thyroid normal size, non-tender, without nodularity LYMPH:  no palpable lymphadenopathy in the cervical, axillary or inguinal regions LUNGS:  clear to auscultation and percussion with normal breathing effort HEART: regular rate & rhythm and no murmurs and no lower extremity edema ABDOMEN:abdomen soft, mild tenderness at the right upper quadrant , no rebound pain, normal bowel sounds Musculoskeletal:no cyanosis of digits and no clubbing  PSYCH: alert & oriented x 3 with fluent speech NEURO: no focal motor deficits, his light touch sensation and vibration sensation are diminished on his hands and feet.   LABORATORY DATA:  I have reviewed the data as listed CBC Latest Ref Rng 12/12/2015 12/05/2015 11/26/2015  WBC 4.0 - 10.3 10e3/uL 5.1 6.6 8.1  Hemoglobin 13.0 - 17.1 g/dL 7.5(L) 8.3(L) 9.0(L)  Hematocrit 38.4 - 49.9 % 24.5(L) 26.7(L) 28.7(L)  Platelets 140 - 400 10e3/uL 61(L) 142 192    CMP Latest Ref Rng 12/12/2015 12/05/2015 11/26/2015  Glucose 70 - 140 mg/dl 135 263(H) 82  BUN 7.0 - 26.0 mg/dL 16.1 15.6 9.9  Creatinine 0.7 - 1.3 mg/dL 1.0 1.1 1.0  Sodium 136 - 145 mEq/L 143 140 145  Potassium 3.5 - 5.1 mEq/L 3.8 3.8 3.7  CO2 22 - 29 mEq/L '25 26 28  '$ Calcium 8.4 - 10.4 mg/dL 8.8 8.6 9.0  Total Protein 6.4 - 8.3 g/dL 6.1(L) 6.1(L) 6.4  Total Bilirubin 0.20 - 1.20 mg/dL <0.30 <0.30 <0.30  Alkaline Phos 40 - 150 U/L 116 97 102  AST 5 - 34 U/L '31 22 28  '$ ALT 0 - 55 U/L '28 15 20   '$ Results for ANDEN, BARTOLO (MRN [id number]) as of 10/22/2015 07:11  Ref. Range 05/29/2015 08:20 06/25/2015 11:24 07/09/2015 13:43 08/06/2015 09:20 09/24/2015 07:51  CEA Latest Units: ng/mL 17.6 (H) 20.2 (H) 25.0    CEA Latest Ref Range: 0.0-4.7 ng/mL  33.6 (H) 41.0 (H) 50.5 (H) 74.6 (H)    Pathology report:  Small Intestine Biopsy, jejunal mass 08/18/2014 - INVASIVE ADENOCARCINOMA IN A BACKGROUND OF TUBULOVILLOUS ADENOMA. ADDITIONAL INFORMATION: Mismatch Repair (MMR) Protein Immunohistochemistry (IHC) IHC Expression Result (LIMITED TUMOR): MLH1: Preserved nuclear expression (greater 50% tumor expression) MSH2: Preserved nuclear expression (greater 50% tumor  expression) MSH6: Preserved nuclear expression (greater 50% tumor expression) PMS2: Preserved nuclear expression (greater 50% tumor expression) * Internal control demonstrates intact nuclear expression Interpretation: NORMAL There is preserved expression  Diagnosis 08/28/2014  Liver, needle/core biopsy, right lobe - METASTATIC ADENOCARCINOMA   RADIOGRAPHIC STUDIES: I have personally reviewed the radiological images as listed and agreed with the findings in the report.   CT chest, abdomen and pelvis with contrast 11/26/2015 IMPRESSION: 1. Interval increase in size of liver lesions. 2. Multi focal pulmonary nodules are not significantly changed in the interval. 3. Aortic atherosclerosis.    ASSESSMENT & PLAN:  70 year old gentleman with past medical history of diabetes, hypertension, who presents with anemia and weakness.  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  -Unfortunately he has stage IV disease with diffuse liver metastasis, this is an incurable disease, with overall very poor prognosis. -I reviewed his staging CT scan results from 11/26/2015 with patient and his wife, Unfortunately scan showed disease progression in the liver, his tumor marker CA has been trending up lately, which is consistent with disease progression. -I previously discussed his guardant 360 number testing results, which showed mutations in KRAS, RAF 1, APC, TP 53,  Targeted therapy is nt available at this point. - I recommend him to stop for file due to disease progression. We discussed third line treatment options, including single agent chemotherapy, such as gemcitabine, Xeloda, or clinical trial.  Due to the rarity of the small bowel cancer,there is no large clinical trial data available. Retrospective data have shown to efficacy of  gemcitabine (Am J Clin Oncol. 2006;29(3):225. ),  Which I recommend him to tryas Treatment. He has been on 5-FU based therapy for the past 14 months,I think the response rate of  Xeloda will be very below.  - I encouraged him to consider clinical trials,  Likely early phase studies. He is interested. I'll look for clinical trial options for him the other institutions.  - after lengthy discussion, patient agrees to try single agent gemcitabine. I recommend gemcitabine1000 mg/m weekly, 2-3 weeks on, one-week off, depends on his tolerance and blood counts. He tolerated the first dose Gemzar doing well, however he developed worsening anemia and moderate thrombocytopenia with platelet 61K today. I'll hold on chemotherapy today, and restarted with dose reduction to 800 mg/m, 2 weeks on, one-week off  2. Microcytic anemia secondary to GI bleeding, iron deficiency and chemotherapy -His serum iron level and saturation were low, although ferritin is normal, he has some degree of iron deficient anemia, and anemia of malignancy  -Consider blood transfusion if hemoglobin less than 7.5 or symptomatic anemia -he received IV feraheme 510 mg twice in 09/2014, repeated iron study was normal on 07/23/2015 -His anemia, worse after he started gemcitabine, hemoglobin 7.5 today, asymptomatic. I'll hold on blood transfusion today -I encouraged him to take a multivitamin with minerals  3.  Hypertension and DM  -He will continue follow-up with his primary care physician. -We discussed that we will monitor his blood pressure and blood sugar closely and may need to adjust his medication during the chemotherapy  -His diabetes has been better controlled since his insulin was adjusted  4. Peripheral neuropathy, secondary to chemotherapy, G2 -Overall improved. Stable lately. -Continue Neurontin  200 mg 3 times a day -We'll continue observation.   Plan: -Due to the moderate thrombocytopenia, I will hold gemcitabine today.  Restart at a reduced dose 800 mg/m, 2 weeks on, one-week off -No blood transfusion today, but may need blood transfusion next week if worsening anemia or symptomatic -I'll see him back in 2 weeks  Truitt Merle  12/12/2015

## 2015-12-12 NOTE — Telephone Encounter (Signed)
Gave patient avs report and appointments for July.  °

## 2015-12-19 ENCOUNTER — Other Ambulatory Visit (HOSPITAL_BASED_OUTPATIENT_CLINIC_OR_DEPARTMENT_OTHER): Payer: Medicare Other

## 2015-12-19 ENCOUNTER — Encounter: Payer: Self-pay | Admitting: Pharmacist

## 2015-12-19 ENCOUNTER — Ambulatory Visit (HOSPITAL_BASED_OUTPATIENT_CLINIC_OR_DEPARTMENT_OTHER): Payer: Medicare Other

## 2015-12-19 ENCOUNTER — Ambulatory Visit: Payer: Medicare Other

## 2015-12-19 VITALS — BP 114/58 | HR 61 | Temp 98.1°F | Resp 16

## 2015-12-19 DIAGNOSIS — Z5111 Encounter for antineoplastic chemotherapy: Secondary | ICD-10-CM

## 2015-12-19 DIAGNOSIS — C179 Malignant neoplasm of small intestine, unspecified: Secondary | ICD-10-CM

## 2015-12-19 DIAGNOSIS — C787 Secondary malignant neoplasm of liver and intrahepatic bile duct: Secondary | ICD-10-CM | POA: Diagnosis not present

## 2015-12-19 DIAGNOSIS — C772 Secondary and unspecified malignant neoplasm of intra-abdominal lymph nodes: Secondary | ICD-10-CM | POA: Diagnosis not present

## 2015-12-19 DIAGNOSIS — D630 Anemia in neoplastic disease: Secondary | ICD-10-CM

## 2015-12-19 LAB — COMPREHENSIVE METABOLIC PANEL
ALBUMIN: 3 g/dL — AB (ref 3.5–5.0)
ALK PHOS: 110 U/L (ref 40–150)
ALT: 15 U/L (ref 0–55)
ANION GAP: 8 meq/L (ref 3–11)
AST: 23 U/L (ref 5–34)
BUN: 16.1 mg/dL (ref 7.0–26.0)
CALCIUM: 8.7 mg/dL (ref 8.4–10.4)
CO2: 26 mEq/L (ref 22–29)
Chloride: 107 mEq/L (ref 98–109)
Creatinine: 1.1 mg/dL (ref 0.7–1.3)
EGFR: 68 mL/min/{1.73_m2} — ABNORMAL LOW (ref >90)
Glucose: 137 mg/dl (ref 70–140)
POTASSIUM: 4 meq/L (ref 3.5–5.1)
Sodium: 141 mEq/L (ref 136–145)
Total Bilirubin: 0.43 mg/dL (ref 0.20–1.20)
Total Protein: 6.2 g/dL — ABNORMAL LOW (ref 6.4–8.3)

## 2015-12-19 LAB — CBC WITH DIFFERENTIAL/PLATELET
BASO%: 0.4 % (ref 0.0–2.0)
BASOS ABS: 0 10*3/uL (ref 0.0–0.1)
EOS ABS: 0.2 10*3/uL (ref 0.0–0.5)
EOS%: 2.9 % (ref 0.0–7.0)
HEMATOCRIT: 27.4 % — AB (ref 38.4–49.9)
HEMOGLOBIN: 8.5 g/dL — AB (ref 13.0–17.1)
LYMPH#: 1 10*3/uL (ref 0.9–3.3)
LYMPH%: 18.5 % (ref 14.0–49.0)
MCH: 27.5 pg (ref 27.2–33.4)
MCHC: 31.1 g/dL — ABNORMAL LOW (ref 32.0–36.0)
MCV: 88.6 fL (ref 79.3–98.0)
MONO#: 0.6 10*3/uL (ref 0.1–0.9)
MONO%: 11.1 % (ref 0.0–14.0)
NEUT#: 3.7 10*3/uL (ref 1.5–6.5)
NEUT%: 67.1 % (ref 39.0–75.0)
PLATELETS: 109 10*3/uL — AB (ref 140–400)
RBC: 3.09 10*6/uL — ABNORMAL LOW (ref 4.20–5.82)
RDW: 19.2 % — AB (ref 11.0–14.6)
WBC: 5.6 10*3/uL (ref 4.0–10.3)

## 2015-12-19 MED ORDER — SODIUM CHLORIDE 0.9 % IV SOLN
Freq: Once | INTRAVENOUS | Status: AC
  Administered 2015-12-19: 10:00:00 via INTRAVENOUS

## 2015-12-19 MED ORDER — SODIUM CHLORIDE 0.9 % IV SOLN
800.0000 mg/m2 | Freq: Once | INTRAVENOUS | Status: AC
  Administered 2015-12-19: 1596 mg via INTRAVENOUS
  Filled 2015-12-19: qty 41.98

## 2015-12-19 MED ORDER — HEPARIN SOD (PORK) LOCK FLUSH 100 UNIT/ML IV SOLN
500.0000 [IU] | Freq: Once | INTRAVENOUS | Status: AC | PRN
  Administered 2015-12-19: 500 [IU]
  Filled 2015-12-19: qty 5

## 2015-12-19 MED ORDER — PROCHLORPERAZINE MALEATE 10 MG PO TABS
10.0000 mg | ORAL_TABLET | Freq: Once | ORAL | Status: AC
  Administered 2015-12-19: 10 mg via ORAL

## 2015-12-19 MED ORDER — SODIUM CHLORIDE 0.9% FLUSH
10.0000 mL | INTRAVENOUS | Status: DC | PRN
  Administered 2015-12-19: 10 mL
  Filled 2015-12-19: qty 10

## 2015-12-19 MED ORDER — PROCHLORPERAZINE MALEATE 10 MG PO TABS
ORAL_TABLET | ORAL | Status: AC
  Filled 2015-12-19: qty 1

## 2015-12-19 NOTE — Patient Instructions (Signed)
Cancer Center Discharge Instructions for Patients Receiving Chemotherapy  Today you received the following chemotherapy agents Gemzar.  To help prevent nausea and vomiting after your treatment, we encourage you to take your nausea medication.   If you develop nausea and vomiting that is not controlled by your nausea medication, call the clinic.   BELOW ARE SYMPTOMS THAT SHOULD BE REPORTED IMMEDIATELY:  *FEVER GREATER THAN 100.5 F  *CHILLS WITH OR WITHOUT FEVER  NAUSEA AND VOMITING THAT IS NOT CONTROLLED WITH YOUR NAUSEA MEDICATION  *UNUSUAL SHORTNESS OF BREATH  *UNUSUAL BRUISING OR BLEEDING  TENDERNESS IN MOUTH AND THROAT WITH OR WITHOUT PRESENCE OF ULCERS  *URINARY PROBLEMS  *BOWEL PROBLEMS  UNUSUAL RASH Items with * indicate a potential emergency and should be followed up as soon as possible.  Feel free to call the clinic you have any questions or concerns. The clinic phone number is [phone].  Please show the CHEMO ALERT CARD at check-in to the Emergency Department and triage nurse.   

## 2015-12-26 ENCOUNTER — Encounter: Payer: Self-pay | Admitting: Hematology

## 2015-12-26 ENCOUNTER — Telehealth: Payer: Self-pay | Admitting: *Deleted

## 2015-12-26 ENCOUNTER — Other Ambulatory Visit (HOSPITAL_BASED_OUTPATIENT_CLINIC_OR_DEPARTMENT_OTHER): Payer: Medicare Other

## 2015-12-26 ENCOUNTER — Ambulatory Visit (HOSPITAL_BASED_OUTPATIENT_CLINIC_OR_DEPARTMENT_OTHER): Payer: Medicare Other

## 2015-12-26 ENCOUNTER — Ambulatory Visit (HOSPITAL_COMMUNITY)
Admission: RE | Admit: 2015-12-26 | Discharge: 2015-12-26 | Disposition: A | Discharge status: Home / Self Care | Payer: Medicare Other | Source: Ambulatory Visit | Attending: Hematology | Admitting: Hematology

## 2015-12-26 ENCOUNTER — Ambulatory Visit: Payer: Medicare Other

## 2015-12-26 ENCOUNTER — Ambulatory Visit (HOSPITAL_BASED_OUTPATIENT_CLINIC_OR_DEPARTMENT_OTHER): Payer: Medicare Other | Admitting: Hematology

## 2015-12-26 ENCOUNTER — Telehealth: Payer: Self-pay | Admitting: Hematology

## 2015-12-26 VITALS — BP 127/52 | HR 66 | Temp 98.2°F | Resp 18 | Ht 66.0 in | Wt 187.6 lb

## 2015-12-26 DIAGNOSIS — C772 Secondary and unspecified malignant neoplasm of intra-abdominal lymph nodes: Secondary | ICD-10-CM | POA: Diagnosis not present

## 2015-12-26 DIAGNOSIS — C179 Malignant neoplasm of small intestine, unspecified: Secondary | ICD-10-CM

## 2015-12-26 DIAGNOSIS — G620 Drug-induced polyneuropathy: Secondary | ICD-10-CM

## 2015-12-26 DIAGNOSIS — Z5111 Encounter for antineoplastic chemotherapy: Secondary | ICD-10-CM

## 2015-12-26 DIAGNOSIS — Z95828 Presence of other vascular implants and grafts: Secondary | ICD-10-CM

## 2015-12-26 DIAGNOSIS — D6481 Anemia due to antineoplastic chemotherapy: Secondary | ICD-10-CM | POA: Diagnosis not present

## 2015-12-26 DIAGNOSIS — C787 Secondary malignant neoplasm of liver and intrahepatic bile duct: Secondary | ICD-10-CM

## 2015-12-26 DIAGNOSIS — D509 Iron deficiency anemia, unspecified: Secondary | ICD-10-CM

## 2015-12-26 DIAGNOSIS — D630 Anemia in neoplastic disease: Secondary | ICD-10-CM

## 2015-12-26 DIAGNOSIS — I10 Essential (primary) hypertension: Secondary | ICD-10-CM

## 2015-12-26 DIAGNOSIS — T451X5A Adverse effect of antineoplastic and immunosuppressive drugs, initial encounter: Secondary | ICD-10-CM

## 2015-12-26 DIAGNOSIS — G622 Polyneuropathy due to other toxic agents: Secondary | ICD-10-CM

## 2015-12-26 DIAGNOSIS — E119 Type 2 diabetes mellitus without complications: Secondary | ICD-10-CM

## 2015-12-26 DIAGNOSIS — D500 Iron deficiency anemia secondary to blood loss (chronic): Secondary | ICD-10-CM

## 2015-12-26 LAB — COMPREHENSIVE METABOLIC PANEL
ALBUMIN: 3.1 g/dL — AB (ref 3.5–5.0)
ALK PHOS: 128 U/L (ref 40–150)
ALT: 26 U/L (ref 0–55)
ANION GAP: 8 meq/L (ref 3–11)
AST: 33 U/L (ref 5–34)
BILIRUBIN TOTAL: 0.34 mg/dL (ref 0.20–1.20)
BUN: 20 mg/dL (ref 7.0–26.0)
CALCIUM: 8.6 mg/dL (ref 8.4–10.4)
CO2: 25 mEq/L (ref 22–29)
Chloride: 109 mEq/L (ref 98–109)
Creatinine: 1 mg/dL (ref 0.7–1.3)
EGFR: 76 mL/min/{1.73_m2} — AB (ref >90)
GLUCOSE: 113 mg/dL (ref 70–140)
POTASSIUM: 3.7 meq/L (ref 3.5–5.1)
SODIUM: 141 meq/L (ref 136–145)
TOTAL PROTEIN: 6.4 g/dL (ref 6.4–8.3)

## 2015-12-26 LAB — CBC WITH DIFFERENTIAL/PLATELET
BASO%: 0.5 % (ref 0.0–2.0)
Basophils Absolute: 0 10*3/uL (ref 0.0–0.1)
EOS ABS: 0 10*3/uL (ref 0.0–0.5)
EOS%: 0.8 % (ref 0.0–7.0)
HCT: 25.3 % — ABNORMAL LOW (ref 38.4–49.9)
HGB: 7.7 g/dL — ABNORMAL LOW (ref 13.0–17.1)
LYMPH%: 24.4 % (ref 14.0–49.0)
MCH: 26.9 pg — AB (ref 27.2–33.4)
MCHC: 30.6 g/dL — AB (ref 32.0–36.0)
MCV: 87.8 fL (ref 79.3–98.0)
MONO#: 0.3 10*3/uL (ref 0.1–0.9)
MONO%: 8.3 % (ref 0.0–14.0)
NEUT%: 66 % (ref 39.0–75.0)
NEUTROS ABS: 2.2 10*3/uL (ref 1.5–6.5)
Platelets: 97 10*3/uL — ABNORMAL LOW (ref 140–400)
RBC: 2.88 10*6/uL — AB (ref 4.20–5.82)
RDW: 18.7 % — ABNORMAL HIGH (ref 11.0–14.6)
WBC: 3.3 10*3/uL — AB (ref 4.0–10.3)
lymph#: 0.8 10*3/uL — ABNORMAL LOW (ref 0.9–3.3)

## 2015-12-26 MED ORDER — SODIUM CHLORIDE 0.9 % IJ SOLN
10.0000 mL | INTRAMUSCULAR | Status: DC | PRN
  Administered 2015-12-26: 10 mL via INTRAVENOUS
  Filled 2015-12-26: qty 10

## 2015-12-26 MED ORDER — HEPARIN SOD (PORK) LOCK FLUSH 100 UNIT/ML IV SOLN
500.0000 [IU] | Freq: Once | INTRAVENOUS | Status: AC | PRN
  Administered 2015-12-26: 500 [IU]
  Filled 2015-12-26: qty 5

## 2015-12-26 MED ORDER — SODIUM CHLORIDE 0.9 % IV SOLN
800.0000 mg/m2 | Freq: Once | INTRAVENOUS | Status: AC
  Administered 2015-12-26: 1596 mg via INTRAVENOUS
  Filled 2015-12-26: qty 41.98

## 2015-12-26 MED ORDER — SODIUM CHLORIDE 0.9 % IV SOLN
Freq: Once | INTRAVENOUS | Status: AC
  Administered 2015-12-26: 13:00:00 via INTRAVENOUS

## 2015-12-26 MED ORDER — PROCHLORPERAZINE MALEATE 10 MG PO TABS
10.0000 mg | ORAL_TABLET | Freq: Once | ORAL | Status: AC
  Administered 2015-12-26: 10 mg via ORAL

## 2015-12-26 MED ORDER — PROCHLORPERAZINE MALEATE 10 MG PO TABS
ORAL_TABLET | ORAL | Status: AC
  Filled 2015-12-26: qty 1

## 2015-12-26 MED ORDER — SODIUM CHLORIDE 0.9% FLUSH
10.0000 mL | INTRAVENOUS | Status: DC | PRN
  Administered 2015-12-26: 10 mL
  Filled 2015-12-26: qty 10

## 2015-12-26 NOTE — Progress Notes (Signed)
Dr. Burr Medico aware of Hgb 7.7 and PLT 97, proceed with treatment today despite labs per MD.

## 2015-12-26 NOTE — Progress Notes (Signed)
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      08/18/2014 Miscellaneous    tumor KRAS mutation (-)     08/18/2014 Pathology Results    Liver biopsy showed metastatic adenocarcinoma, consistent with GI primary     08/18/2014 Initial Diagnosis    Small bowel cancer     08/18/2014 Procedure    small bowel enteroscopy with biopsy by Dr. Benson Evans showed at the proximal jejunum there was evidence of abnormal mucosa and friability, biopsy was obtained.      09/05/2014 Imaging    PET hypermetabolic mass involving a small bowel loop with adjacent mesenteric lymphadenopathy, and diffuse liver metastasis and mild hypermetabolic lymphadenopathy in porta hepatis.     09/06/2014 - 02/01/2015 Chemotherapy    mFOLFOX, stopped due to his neuropathy, and earlier mild disease progression on PET      09/07/2014 - 09/11/2014 Hospital Admission    He was admitted for fever and GI bleeding. Received 3 units of RBC.     10/26/2014 Imaging    Interval significant improvement in the primary small bowel mass, adjacent lymphadenopathy and extensive hepatic metastatic disease. No other new lesions.      02/09/2015 Imaging    PET/CT scan showed stable primary malignancy in the proximal jejunum, mild metabolic progression of the 3 residual metabolic liver metastasis. CT  Portion showed decreased size of his liver metastasis.     02/27/2015 - 11/06/2015 Chemotherapy    second line chemo FOLFIRI, every 2  weeks, and panitumumab (held for second cycle due to severe skin rashes), chemotherapy stopped due to disease progression.     08/06/2015 Imaging    Stable number and size of the liver lesions. The left lower lobe nodule is considerably less prominent. No other new lesions.     11/26/2015 Progression    Restaging CT chest, abdomen and pelvis with contrast showed interval increase in size of liver lesions, multiple small pulmonary nodules are not significantly changed in the interval. Her tumor marker CEA also increased significantly     12/07/2015 -  Chemotherapy    Gemcitabine 1000 mg/m2 (decreased to 800 mg/m from second dose), and a one and 8 every 21 days       HISTORY OF PRESENTING ILLNESS:  Tyler Evans 70 y.o. male is here because of a recent diagnosis of small bowel cancer. He presented to his PCP on 08/17/2014 with complaints of feeling weak and a one month history of a cough. He was found to be anemic and was referred to the emergency department. Hemoglobin was found to be 7.6, MCV 76.6; creatinine was elevated at 1.74. Stool was Hemoccult positive.  CT abdomen/pelvis without contrast on 08/17/2014 showed multiple large masses within the liver, bulky in appearance. The largest measured approximately 5.7 cm. There appeared to be a focal filling defect within the proximal jejunum measuring approximately 2.5 x 1.7 cm. There was mild adjacent jejunal wall thickening.  The lung bases were clear.  On 08/18/2014 he underwent a small bowel enteroscopy with biopsy by Dr. Benson Evans. The esophagus and gastric lumen were normal. At the proximal jejunum there was evidence of abnormal mucosa and friability. The pediatric colonoscope was not able to traverse the area. Biopsies were obtained. An ultraslim colonoscope was then utilized. This colonoscope was able to traverse the area of stenosis which measured approximately 3 cm in length. 50% of the lumen was ulcerated. Pathology showed invasive adenocarcinoma  in a background of tubulovillous adenoma. MMR stains are pending.  He was discharged home on 08/19/2014.  CURRENT THERAPY: third line chemo with weekly gemcitabine, '100mg'$ /m2 (reduced to '800mg'$ /m2 from second dose due to cytopenia), 2 weeks on and one week off   INTERIM HISTORY:  Tyler Evans returns for follow-up and third dose of gemcitabine. He has tolerated the first 2 doses very well, no noticeable side effects. He states his energy level has not changed much, he functions well at home, and are able to tolerate routine activities without difficulties. He has good appetite, weight is stable. No other complaints.  MEDICAL HISTORY:  Past Medical History:  Diagnosis Date  . Diabetes mellitus without complication ([place])   . Hypertension   . Small bowel cancer ([place]) 08/18/2014    SURGICAL HISTORY: Past Surgical History:  Procedure Laterality Date  . ENTEROSCOPY N/A 08/18/2014   Procedure: ENTEROSCOPY;  Surgeon: Evans Ada, MD;  Location: [facility];  Service: Endoscopy;  Laterality: N/A;    SOCIAL HISTORY: History   Social History  . Marital Status: Married    Spouse Name: N/A  . Number of Children: 2  . Years of Education: N/A   Occupational History  .  retired Ambulance person    Social History Main Topics  . Smoking status: Never Smoker   . Smokeless tobacco: Not on file  . Alcohol Use: No  . Drug Use: Not on file  . Sexual Activity: Not on file   Other Topics Concern  . Not on file   Social History Narrative  He lives in [place]. He is married. He has 2 children both reported to be in good health. No tobacco or alcohol use. He is a retired Ambulance person.  FAMILY HISTORY: Family History  Problem Relation Age of Onset  . Heart failure, Alzheimer's  Mother   . Lung cancer Father   . Kidney cancer Brother     ALLERGIES:  is allergic to penicillins.  MEDICATIONS:  Current Outpatient Prescriptions on File Prior to Visit  Medication Sig Dispense Refill  .  atorvastatin (LIPITOR) 20 MG tablet Take 20 mg by mouth daily.    . Blood Glucose Monitoring Suppl (ONE TOUCH ULTRA MINI) W/DEVICE KIT     . clindamycin (CLINDAGEL) 1 % gel Apply topically 2 (two) times daily. 30 g 2  . gabapentin (NEURONTIN) 100 MG capsule TAKE 2 CAPSULES BY MOUTH 3 TIMES A DAY 180 capsule 1  . hydrocortisone 2.5 % cream Apply topically 2 (two) times daily. 30 g 2  . LANTUS SOLOSTAR 100 UNIT/ML Solostar Pen     . lidocaine-prilocaine (EMLA) cream Apply topically as needed. Apply to portacath 1 1/2 hours - 2 hours prior to procedures as needed. 30 g 2  . lisinopril (PRINIVIL,ZESTRIL) 10 MG tablet Take 10 mg by mouth daily.  6  . ONE TOUCH ULTRA TEST test strip     . PRESCRIPTION MEDICATION See admin instructions. Receives Vectibix, Leucovorin, and Adrucil chemotherapy every 2 weeks.    Marland Kitchen  tamsulosin (FLOMAX) 0.4 MG CAPS capsule Take 0.4 mg by mouth daily.  12   No current facility-administered medications on file prior to visit.   ;  REVIEW OF SYSTEMS:   Constitutional: Denies fevers, chills or abnormal night sweats; mild fatigue, weight is stable.  Eyes: Denies blurriness of vision, double vision or watery eyes Ears, nose, mouth, throat, and face: Denies mucositis or sore throat Respiratory: One month history of a nonproductive cough;  no shortness of breath Cardiovascular: Denies palpitation, chest discomfort or lower extremity swelling Gastrointestinal:  Denies nausea, heartburn. No dysphagia. He has noted less frequent bowel movements over the past 2 weeks. Does not characterize this as constipation. Skin: Denies abnormal skin rashes Lymphatics: Denies new lymphadenopathy or easy bruising Neurological: Denies numbness, tingling. No extremity weakness Behavioral/Psych: Mood is stable, no new changes  All other systems were reviewed with the patient and are negative.  PHYSICAL EXAMINATION: ECOG PERFORMANCE STATUS: 1  Vitals:   12/26/15 1150  BP: (!) 127/52  Pulse:  66  Resp: 18  Temp: 98.2 F (36.8 C)   Filed Weights   12/26/15 1150  Weight: 187 lb 9.6 oz (85.1 kg)    GENERAL:alert, no distress and comfortable SKIN: skin color, texture, turgor are normal, (+) skin pigmentation from his rash on his face, neck, chest and upper abdomen.  EYES: normal, conjunctiva are pink and non-injected, sclera clear OROPHARYNX:no exudate, no erythema and lips, buccal mucosa, and tongue normal  NECK: supple, thyroid normal size, non-tender, without nodularity LYMPH:  no palpable lymphadenopathy in the cervical, axillary or inguinal regions LUNGS: clear to auscultation and percussion with normal breathing effort HEART: regular rate & rhythm and no murmurs and no lower extremity edema ABDOMEN:abdomen soft, mild tenderness at the right upper quadrant , no rebound pain, normal bowel sounds Musculoskeletal:no cyanosis of digits and no clubbing  PSYCH: alert & oriented x 3 with fluent speech NEURO: no focal motor deficits, his light touch sensation and vibration sensation are diminished on his hands and feet.   LABORATORY DATA:  I have reviewed the data as listed CBC Latest Ref Rng & Units 12/26/2015 12/19/2015 12/12/2015  WBC 4.0 - 10.3 10e3/uL 3.3(L) 5.6 5.1  Hemoglobin 13.0 - 17.1 g/dL 7.7(L) 8.5(L) 7.5(L)  Hematocrit 38.4 - 49.9 % 25.3(L) 27.4(L) 24.5(L)  Platelets 140 - 400 10e3/uL 97(L) 109(L) 61(L)    CMP Latest Ref Rng & Units 12/26/2015 12/19/2015 12/12/2015  Glucose 70 - 140 mg/dl 113 137 135  BUN 7.0 - 26.0 mg/dL 20.0 16.1 16.1  Creatinine 0.7 - 1.3 mg/dL 1.0 1.1 1.0  Sodium 136 - 145 mEq/L 141 141 143  Potassium 3.5 - 5.1 mEq/L 3.7 4.0 3.8  Chloride 96 - 112 mmol/L - - -  CO2 22 - 29 mEq/L '25 26 25  '$ Calcium 8.4 - 10.4 mg/dL 8.6 8.7 8.8  Total Protein 6.4 - 8.3 g/dL 6.4 6.2(L) 6.1(L)  Total Bilirubin 0.20 - 1.20 mg/dL 0.34 0.43 <0.30  Alkaline Phos 40 - 150 U/L 128 110 116  AST 5 - 34 U/L 33 23 31  ALT 0 - 55 U/L '26 15 28   '$ CEA  Order: [id number]    Status:  Final result Visible to patient:  Yes (MyChart) Next appt:  01/09/2016 at 08:15 AM in Oncology ([facility] ONLY) Dx:  Small bowel cancer ([place])    Ref Range & Units 2moago 281mogo 70m139moo 39mo639mo 64mo 7mo  CEA 0.0 - 4.7 ng/mL 153.5  92.7CM  74.6CM  50.5CM  41.0CM   Comments:           Roche ECLIA methodology    Nonsmokers <3.9         Pathology report:  Small Intestine Biopsy, jejunal mass 08/18/2014 - INVASIVE ADENOCARCINOMA IN A BACKGROUND OF TUBULOVILLOUS ADENOMA. ADDITIONAL INFORMATION: Mismatch Repair (MMR) Protein Immunohistochemistry (IHC) IHC Expression Result (LIMITED TUMOR): MLH1: Preserved nuclear expression (greater 50% tumor expression) MSH2: Preserved nuclear expression (greater 50% tumor expression) MSH6: Preserved nuclear expression (greater 50% tumor expression) PMS2: Preserved nuclear expression (greater 50% tumor expression) * Internal control demonstrates intact nuclear expression Interpretation: NORMAL There is preserved expression  Diagnosis 08/28/2014  Liver, needle/core biopsy, right lobe - METASTATIC ADENOCARCINOMA   RADIOGRAPHIC STUDIES: I have personally reviewed the radiological images as listed and agreed with the findings in the report.   CT chest, abdomen and pelvis with contrast 11/26/2015 IMPRESSION: 1. Interval increase in size of liver lesions. 2. Multi focal pulmonary nodules are not significantly changed in the interval. 3. Aortic atherosclerosis.    ASSESSMENT & PLAN:  70 year old gentleman with past medical history of diabetes, hypertension, who presents with anemia and weakness.  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  -Unfortunately he has stage IV disease with diffuse liver metastasis, this is an incurable disease, with overall  very poor prognosis. -I reviewed his staging CT scan results from 11/26/2015 with patient and his wife, Unfortunately scan showed disease progression in the liver, his tumor marker CA has been trending up lately, which is consistent with disease progression. -I previously discussed his guardant 360 number testing results, which showed mutations in KRAS, RAF 1, APC, TP 53,  Targeted therapy is nt available at this point. -He is now on saline chemotherapy with gemcitabine. Dose reduced due to significant Pancytopenia. I'll Continue 800 Mg/M, 2 weeks on, one-week off -Lab reviewed, adequate for treatment, we'll proceed day C2D8 treatment today  2. Microcytic anemia secondary to GI bleeding, iron deficiency and chemotherapy -His serum iron level and saturation were low, although ferritin is normal, he has some degree of iron deficient anemia, and anemia of malignancy  -Consider blood transfusion if hemoglobin less than 7.5 or symptomatic anemia -he received IV feraheme 510 mg twice in 09/2014, repeated iron study was normal on 07/23/2015 -His anemia got worse after he started gemcitabine, hemoglobin 7.7 today, asymptomatic. I'll hold on blood transfusion today -I encouraged him to take a multivitamin with minerals  3.  Hypertension and DM  -He will continue follow-up with his primary care physician. -We discussed that we will monitor his blood pressure and blood sugar closely and may need to adjust his medication during the chemotherapy  -His diabetes has been better controlled since his insulin was adjusted  4. Peripheral neuropathy, secondary to chemotherapy, G2 -Overall improved. Stable lately. -Continue Neurontin  200 mg 3 times a day -We'll continue observation.   Plan: -continue gemcitabine 800 mg/m, 2 weeks on, one-week off, C2D8 today, off next week -No blood transfusion today, but may need blood transfusion if Hb<7.5 or develops symptoms -I'll see him back in 2 weeks before [place], Kylar Leonhardt  12/26/2015

## 2015-12-26 NOTE — Progress Notes (Signed)
Pt saw Dr. Burr Medico today prior to chemo.   Proceed with chemo as planned with low Hgb and Platelet counts as per md.

## 2015-12-26 NOTE — Telephone Encounter (Signed)
Per staff message and POF I have scheduled appts. Advised scheduler of appts. JMW  

## 2015-12-26 NOTE — Patient Instructions (Signed)
[facility] Discharge Instructions for Patients Receiving Chemotherapy  Today you received the following chemotherapy agents Gemzar  To help prevent nausea and vomiting after your treatment, we encourage you to take your nausea medication as directed.    If you develop nausea and vomiting that is not controlled by your nausea medication, call the clinic.   BELOW ARE SYMPTOMS THAT SHOULD BE REPORTED IMMEDIATELY:  *FEVER GREATER THAN 100.5 F  *CHILLS WITH OR WITHOUT FEVER  NAUSEA AND VOMITING THAT IS NOT CONTROLLED WITH YOUR NAUSEA MEDICATION  *UNUSUAL SHORTNESS OF BREATH  *UNUSUAL BRUISING OR BLEEDING  TENDERNESS IN MOUTH AND THROAT WITH OR WITHOUT PRESENCE OF ULCERS  *URINARY PROBLEMS  *BOWEL PROBLEMS  UNUSUAL RASH Items with * indicate a potential emergency and should be followed up as soon as possible.  Feel free to call the clinic you have any questions or concerns. The clinic phone number is [phone].  Please show the CHEMO ALERT CARD at check-in to the Emergency Department and triage nurse.   

## 2015-12-26 NOTE — Telephone Encounter (Signed)
per pof to sch pt appt-sent MW email to sch trmt for 8/9-pt to get uopdated copy b4 leaving trmt

## 2015-12-27 LAB — CEA: CEA: 180.7 ng/mL — ABNORMAL HIGH (ref 0.0–4.7)

## 2016-01-08 ENCOUNTER — Other Ambulatory Visit: Payer: Self-pay | Admitting: Hematology

## 2016-01-09 ENCOUNTER — Encounter: Payer: Self-pay | Admitting: Hematology

## 2016-01-09 ENCOUNTER — Ambulatory Visit (HOSPITAL_BASED_OUTPATIENT_CLINIC_OR_DEPARTMENT_OTHER): Payer: Medicare Other | Admitting: Hematology

## 2016-01-09 ENCOUNTER — Ambulatory Visit: Payer: Medicare Other

## 2016-01-09 ENCOUNTER — Ambulatory Visit (HOSPITAL_BASED_OUTPATIENT_CLINIC_OR_DEPARTMENT_OTHER): Payer: Medicare Other

## 2016-01-09 ENCOUNTER — Encounter: Payer: Self-pay | Admitting: *Deleted

## 2016-01-09 ENCOUNTER — Ambulatory Visit (HOSPITAL_COMMUNITY)
Admission: RE | Admit: 2016-01-09 | Discharge: 2016-01-09 | Disposition: A | Discharge status: Home / Self Care | Payer: Medicare Other | Source: Ambulatory Visit | Attending: Hematology | Admitting: Hematology

## 2016-01-09 ENCOUNTER — Other Ambulatory Visit (HOSPITAL_BASED_OUTPATIENT_CLINIC_OR_DEPARTMENT_OTHER): Payer: Medicare Other

## 2016-01-09 ENCOUNTER — Telehealth: Payer: Self-pay | Admitting: Hematology

## 2016-01-09 VITALS — BP 145/50 | HR 65 | Temp 97.8°F | Resp 18 | Ht 66.0 in | Wt 190.3 lb

## 2016-01-09 VITALS — BP 126/64 | HR 56 | Temp 98.5°F | Resp 18

## 2016-01-09 DIAGNOSIS — C772 Secondary and unspecified malignant neoplasm of intra-abdominal lymph nodes: Secondary | ICD-10-CM

## 2016-01-09 DIAGNOSIS — G622 Polyneuropathy due to other toxic agents: Secondary | ICD-10-CM

## 2016-01-09 DIAGNOSIS — Z5111 Encounter for antineoplastic chemotherapy: Secondary | ICD-10-CM | POA: Diagnosis not present

## 2016-01-09 DIAGNOSIS — C179 Malignant neoplasm of small intestine, unspecified: Secondary | ICD-10-CM

## 2016-01-09 DIAGNOSIS — Z95828 Presence of other vascular implants and grafts: Secondary | ICD-10-CM

## 2016-01-09 DIAGNOSIS — C787 Secondary malignant neoplasm of liver and intrahepatic bile duct: Secondary | ICD-10-CM

## 2016-01-09 DIAGNOSIS — D509 Iron deficiency anemia, unspecified: Secondary | ICD-10-CM

## 2016-01-09 DIAGNOSIS — T451X5A Adverse effect of antineoplastic and immunosuppressive drugs, initial encounter: Secondary | ICD-10-CM

## 2016-01-09 DIAGNOSIS — I10 Essential (primary) hypertension: Secondary | ICD-10-CM

## 2016-01-09 DIAGNOSIS — D630 Anemia in neoplastic disease: Secondary | ICD-10-CM

## 2016-01-09 DIAGNOSIS — E119 Type 2 diabetes mellitus without complications: Secondary | ICD-10-CM

## 2016-01-09 DIAGNOSIS — D6481 Anemia due to antineoplastic chemotherapy: Secondary | ICD-10-CM

## 2016-01-09 DIAGNOSIS — D500 Iron deficiency anemia secondary to blood loss (chronic): Secondary | ICD-10-CM

## 2016-01-09 DIAGNOSIS — G620 Drug-induced polyneuropathy: Secondary | ICD-10-CM

## 2016-01-09 LAB — CBC WITH DIFFERENTIAL/PLATELET
BASO%: 0.2 % (ref 0.0–2.0)
BASOS ABS: 0 10*3/uL (ref 0.0–0.1)
EOS ABS: 0.1 10*3/uL (ref 0.0–0.5)
EOS%: 1.3 % (ref 0.0–7.0)
HEMATOCRIT: 24.5 % — AB (ref 38.4–49.9)
HGB: 7.5 g/dL — ABNORMAL LOW (ref 13.0–17.1)
LYMPH#: 1 10*3/uL (ref 0.9–3.3)
LYMPH%: 21.1 % (ref 14.0–49.0)
MCH: 27.2 pg (ref 27.2–33.4)
MCHC: 30.6 g/dL — AB (ref 32.0–36.0)
MCV: 88.8 fL (ref 79.3–98.0)
MONO#: 0.8 10*3/uL (ref 0.1–0.9)
MONO%: 17.1 % — ABNORMAL HIGH (ref 0.0–14.0)
NEUT#: 2.9 10*3/uL (ref 1.5–6.5)
NEUT%: 60.3 % (ref 39.0–75.0)
PLATELETS: 233 10*3/uL (ref 140–400)
RBC: 2.76 10*6/uL — ABNORMAL LOW (ref 4.20–5.82)
RDW: 18.9 % — ABNORMAL HIGH (ref 11.0–14.6)
WBC: 4.8 10*3/uL (ref 4.0–10.3)

## 2016-01-09 LAB — COMPREHENSIVE METABOLIC PANEL
ALBUMIN: 2.9 g/dL — AB (ref 3.5–5.0)
ALT: 14 U/L (ref 0–55)
ANION GAP: 7 meq/L (ref 3–11)
AST: 25 U/L (ref 5–34)
Alkaline Phosphatase: 117 U/L (ref 40–150)
BUN: 17 mg/dL (ref 7.0–26.0)
CALCIUM: 8.7 mg/dL (ref 8.4–10.4)
CHLORIDE: 109 meq/L (ref 98–109)
CO2: 26 meq/L (ref 22–29)
Creatinine: 1 mg/dL (ref 0.7–1.3)
EGFR: 77 mL/min/{1.73_m2} — AB (ref >90)
GLUCOSE: 105 mg/dL (ref 70–140)
POTASSIUM: 3.7 meq/L (ref 3.5–5.1)
Sodium: 142 mEq/L (ref 136–145)
Total Bilirubin: <0.3 mg/dL (ref 0.20–1.20)
Total Protein: 6.1 g/dL — ABNORMAL LOW (ref 6.4–8.3)

## 2016-01-09 MED ORDER — SODIUM CHLORIDE 0.9 % IV SOLN
Freq: Once | INTRAVENOUS | Status: AC
  Administered 2016-01-09: 10:00:00 via INTRAVENOUS

## 2016-01-09 MED ORDER — HEPARIN SOD (PORK) LOCK FLUSH 100 UNIT/ML IV SOLN
500.0000 [IU] | Freq: Once | INTRAVENOUS | Status: AC | PRN
  Administered 2016-01-09: 500 [IU]
  Filled 2016-01-09: qty 5

## 2016-01-09 MED ORDER — SODIUM CHLORIDE 0.9 % IV SOLN: 250.0000 mL | Freq: Once | INTRAVENOUS | Status: DC

## 2016-01-09 MED ORDER — PROCHLORPERAZINE MALEATE 10 MG PO TABS
10.0000 mg | ORAL_TABLET | Freq: Once | ORAL | Status: AC
  Administered 2016-01-09: 10 mg via ORAL

## 2016-01-09 MED ORDER — SODIUM CHLORIDE 0.9% FLUSH
10.0000 mL | INTRAVENOUS | Status: DC | PRN
  Administered 2016-01-09: 10 mL
  Filled 2016-01-09: qty 10

## 2016-01-09 MED ORDER — SODIUM CHLORIDE 0.9 % IV SOLN
800.0000 mg/m2 | Freq: Once | INTRAVENOUS | Status: AC
  Administered 2016-01-09: 1596 mg via INTRAVENOUS
  Filled 2016-01-09: qty 41.98

## 2016-01-09 MED ORDER — SODIUM CHLORIDE 0.9 % IJ SOLN
10.0000 mL | INTRAMUSCULAR | Status: DC | PRN
  Administered 2016-01-09: 10 mL via INTRAVENOUS
  Filled 2016-01-09: qty 10

## 2016-01-09 MED ORDER — PROCHLORPERAZINE MALEATE 10 MG PO TABS
ORAL_TABLET | ORAL | Status: AC
  Filled 2016-01-09: qty 1

## 2016-01-09 NOTE — Patient Instructions (Addendum)
[facility] Discharge Instructions for Patients Receiving Chemotherapy  Today you received the following chemotherapy agents: Gemzar.  To help prevent nausea and vomiting after your treatment, we encourage you to take your nausea medication as directed.   If you develop nausea and vomiting that is not controlled by your nausea medication, call the clinic.   BELOW ARE SYMPTOMS THAT SHOULD BE REPORTED IMMEDIATELY:  *FEVER GREATER THAN 100.5 F  *CHILLS WITH OR WITHOUT FEVER  NAUSEA AND VOMITING THAT IS NOT CONTROLLED WITH YOUR NAUSEA MEDICATION  *UNUSUAL SHORTNESS OF BREATH  *UNUSUAL BRUISING OR BLEEDING  TENDERNESS IN MOUTH AND THROAT WITH OR WITHOUT PRESENCE OF ULCERS  *URINARY PROBLEMS  *BOWEL PROBLEMS  UNUSUAL RASH Items with * indicate a potential emergency and should be followed up as soon as possible.  Feel free to call the clinic you have any questions or concerns. The clinic phone number is [phone].  Please show the [facility] at check-in to the Emergency Department and triage nurse.   Blood Transfusion  A blood transfusion is a procedure in which you receive donated blood through an IV tube. You may need a blood transfusion because of illness, surgery, or injury. The blood may come from a donor, or it may be your own blood that you donated previously. The blood given in a transfusion is made up of different types of cells. You may receive:  Red blood cells. These carry oxygen and replace lost blood.  Platelets. These control bleeding.  Plasma. Thishelps blood to clot. If you have hemophilia or another clotting disorder, you may also receive other types of blood products. LET [facility] CARE PROVIDER KNOW ABOUT:  Any allergies you have.  All medicines you are taking, including vitamins, herbs, eye drops, creams, and over-the-counter medicines.  Previous problems you or members of your family have had with the use of  anesthetics.  Any blood disorders you have.  Previous surgeries you have had.  Any medical conditions you may have.  Any previous reactions you have had during a blood transfusion.  RISKS AND COMPLICATIONS Generally, this is a safe procedure. However, problems may occur, including:  Having an allergic reaction to something in the donated blood.  Fever. This may be a reaction to the white blood cells in the transfused blood.  Iron overload. This can happen from having many transfusions.  Transfusion-related acute lung injury (TRALI). This is a rare reaction that causes lung damage. The cause is not known.TRALI can occur within hours of a transfusion or several days later.  Sudden (acute) or delayed hemolytic reactions. This happens if your blood does not match the cells in your transfusion. Your body's defense system (immune system) may try to attack the new cells. This complication is rare.  Infection. This is rare. BEFORE THE PROCEDURE  You may have a blood test to determine your blood type. This is necessary to know what kind of blood your body will accept.  If you are going to have a planned surgery, you may donate your own blood. This may be done in case you need to have a transfusion.  If you have had an allergic reaction to a transfusion in the past, you may be given medicine to help prevent a reaction. Take this medicine only as directed by your health care provider.  You will have your temperature, blood pressure, and pulse monitored before the transfusion. PROCEDURE   An IV will be started in your hand or arm.  The bag of donated blood will be attached to your IV tube and given into your vein.  Your temperature, blood pressure, and pulse will be monitored regularly during the transfusion. This monitoring is done to detect early signs of a transfusion reaction.  If you have any signs or symptoms of a reaction, your transfusion will be stopped and you may be given  medicine.  When the transfusion is over, your IV will be removed.  Pressure may be applied to the IV site for a few minutes.  A bandage (dressing) will be applied. The procedure may vary among health care providers and hospitals. AFTER THE PROCEDURE  Your blood pressure, temperature, and pulse will be monitored regularly.   This information is not intended to replace advice given to you by your health care provider. Make sure you discuss any questions you have with your health care provider.   Document Released: 05/16/2000 Document Revised: 06/09/2014 Document Reviewed: 03/29/2014 Elsevier Interactive Patient Education [employer].

## 2016-01-09 NOTE — Progress Notes (Signed)
Type and screen obtained for blood products on Friday.

## 2016-01-09 NOTE — Progress Notes (Signed)
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      08/18/2014 Miscellaneous    tumor KRAS mutation (-)     08/18/2014 Pathology Results    Liver biopsy showed metastatic adenocarcinoma, consistent with GI primary     08/18/2014 Initial Diagnosis    Small bowel cancer     08/18/2014 Procedure    small bowel enteroscopy with biopsy by Dr. Benson Evans showed at the proximal jejunum there was evidence of abnormal mucosa and friability, biopsy was obtained.      09/05/2014 Imaging    PET hypermetabolic mass involving a small bowel loop with adjacent mesenteric lymphadenopathy, and diffuse liver metastasis and mild hypermetabolic lymphadenopathy in porta hepatis.     09/06/2014 - 02/01/2015 Chemotherapy    mFOLFOX, stopped due to his neuropathy, and earlier mild disease progression on PET      09/07/2014 - 09/11/2014 Hospital Admission    He was admitted for fever and GI bleeding. Received 3 units of RBC.     10/26/2014 Imaging    Interval significant improvement in the primary small bowel mass, adjacent lymphadenopathy and extensive hepatic metastatic disease. No other new lesions.      02/09/2015 Imaging    PET/CT scan showed stable primary malignancy in the proximal jejunum, mild metabolic progression of the 3 residual metabolic liver metastasis. CT  Portion showed decreased size of his liver metastasis.     02/27/2015 - 11/06/2015 Chemotherapy    second line chemo FOLFIRI, every 2  weeks, and panitumumab (held for second cycle due to severe skin rashes), chemotherapy stopped due to disease progression.     08/06/2015 Imaging    Stable number and size of the liver lesions. The left lower lobe nodule is considerably less prominent. No other new lesions.     11/26/2015 Progression    Restaging CT chest, abdomen and pelvis with contrast showed interval increase in size of liver lesions, multiple small pulmonary nodules are not significantly changed in the interval. Her tumor marker CEA also increased significantly     12/07/2015 -  Chemotherapy    Gemcitabine 1000 mg/m2 (decreased to 800 mg/m from second dose), and a one and 8 every 21 days       HISTORY OF PRESENTING ILLNESS:  Tyler Evans 70 y.o. male is here because of a recent diagnosis of small bowel cancer. He presented to his PCP on 08/17/2014 with complaints of feeling weak and a one month history of a cough. He was found to be anemic and was referred to the emergency department. Hemoglobin was found to be 7.6, MCV 76.6; creatinine was elevated at 1.74. Stool was Hemoccult positive.  CT abdomen/pelvis without contrast on 08/17/2014 showed multiple large masses within the liver, bulky in appearance. The largest measured approximately 5.7 cm. There appeared to be a focal filling defect within the proximal jejunum measuring approximately 2.5 x 1.7 cm. There was mild adjacent jejunal wall thickening.  The lung bases were clear.  On 08/18/2014 he underwent a small bowel enteroscopy with biopsy by Dr. Benson Evans. The esophagus and gastric lumen were normal. At the proximal jejunum there was evidence of abnormal mucosa and friability. The pediatric colonoscope was not able to traverse the area. Biopsies were obtained. An ultraslim colonoscope was then utilized. This colonoscope was able to traverse the area of stenosis which measured approximately 3 cm in length. 50% of the lumen was ulcerated. Pathology showed invasive adenocarcinoma  in a background of tubulovillous adenoma. MMR stains are pending.  He was discharged home on 08/19/2014.  CURRENT THERAPY: third line chemo with weekly gemcitabine, '100mg'$ /m2 (reduced to '800mg'$ /m2 from second dose due to cytopenia), 2 weeks on and one week off   INTERIM HISTORY:  [name] returns for follow-up and cycle 3 chemo. He is doing well overall. He has been tolerating chemotherapy well without noticeable side effects. He has mild fatigue, able to tolerate routine activities without difficulty. He denies any chest pain or dyspnea on exertion. He has good appetite, sleeps well, weight is stable. No complains of pain or other symptoms.  MEDICAL HISTORY:  Past Medical History:  Diagnosis Date  . Diabetes mellitus without complication ([place])   . Hypertension   . Small bowel cancer ([place]) 08/18/2014    SURGICAL HISTORY: Past Surgical History:  Procedure Laterality Date  . ENTEROSCOPY N/A 08/18/2014   Procedure: ENTEROSCOPY;  Surgeon: Evans Ada, MD;  Location: [facility];  Service: Endoscopy;  Laterality: N/A;    SOCIAL HISTORY: History   Social History  . Marital Status: Married    Spouse Name: N/A  . Number of Children: 2  . Years of Education: N/A   Occupational History  .  retired Ambulance person    Social History Main Topics  . Smoking status: Never Smoker   . Smokeless tobacco: Not on file  . Alcohol Use: No  . Drug Use: Not on file  . Sexual Activity: Not on file   Other Topics Concern  . Not on file   Social History Narrative  He lives in [place]. He is married. He has 2 children both reported to be in good health. No tobacco or alcohol use. He is a retired Ambulance person.  FAMILY HISTORY: Family History  Problem Relation Age of Onset  . Heart failure, Alzheimer's  Mother   . Lung cancer Father   . Kidney cancer Brother     ALLERGIES:  is allergic to penicillins.  MEDICATIONS:  Current Outpatient Prescriptions on File Prior to Visit    Medication Sig Dispense Refill  . atorvastatin (LIPITOR) 20 MG tablet Take 20 mg by mouth daily.    . Blood Glucose Monitoring Suppl (ONE TOUCH ULTRA MINI) W/DEVICE KIT     . gabapentin (NEURONTIN) 100 MG capsule TAKE 2 CAPSULES BY MOUTH 3 TIMES A DAY 180 capsule 1  . LANTUS SOLOSTAR 100 UNIT/ML Solostar Pen     . lidocaine-prilocaine (EMLA) cream Apply topically as needed. Apply to portacath 1 1/2 hours - 2 hours prior to procedures as needed. 30 g 2  . lisinopril (PRINIVIL,ZESTRIL) 10 MG tablet Take 10 mg by mouth daily.  6  . ONE TOUCH ULTRA TEST test strip     . PRESCRIPTION MEDICATION See admin instructions. Receives Vectibix, Leucovorin, and Adrucil chemotherapy every 2 weeks.    . tamsulosin (FLOMAX) 0.4 MG CAPS capsule Take 0.4 mg by mouth daily.  12  . clindamycin (CLINDAGEL) 1 % gel Apply topically 2 (  two) times daily. (Patient not taking: Reported on 01/09/2016) 30 g 2  . hydrocortisone 2.5 % cream Apply topically 2 (two) times daily. (Patient not taking: Reported on 01/09/2016) 30 g 2   No current facility-administered medications on file prior to visit.   ;  REVIEW OF SYSTEMS:   Constitutional: Denies fevers, chills or abnormal night sweats; mild fatigue, weight is stable.  Eyes: Denies blurriness of vision, double vision or watery eyes Ears, nose, mouth, throat, and face: Denies mucositis or sore throat Respiratory: One month history of a nonproductive cough;  no shortness of breath Cardiovascular: Denies palpitation, chest discomfort or lower extremity swelling Gastrointestinal:  Denies nausea, heartburn. No dysphagia. He has noted less frequent bowel movements over the past 2 weeks. Does not characterize this as constipation. Skin: Denies abnormal skin rashes Lymphatics: Denies new lymphadenopathy or easy bruising Neurological: Denies numbness, tingling. No extremity weakness Behavioral/Psych: Mood is stable, no new changes  All other systems were reviewed with the patient  and are negative.  PHYSICAL EXAMINATION: ECOG PERFORMANCE STATUS: 1  Vitals:   01/09/16 0838  BP: (!) 145/50  Pulse: 65  Resp: 18  Temp: 97.8 F (36.6 C)   Filed Weights   01/09/16 0838  Weight: 190 lb 4.8 oz (86.3 kg)    GENERAL:alert, no distress and comfortable SKIN: skin color, texture, turgor are normal, (+) skin pigmentation from his rash on his face, neck, chest and upper abdomen.  EYES: normal, conjunctiva are pink and non-injected, sclera clear OROPHARYNX:no exudate, no erythema and lips, buccal mucosa, and tongue normal  NECK: supple, thyroid normal size, non-tender, without nodularity LYMPH:  no palpable lymphadenopathy in the cervical, axillary or inguinal regions LUNGS: clear to auscultation and percussion with normal breathing effort HEART: regular rate & rhythm and no murmurs and no lower extremity edema ABDOMEN:abdomen soft, mild tenderness at the right upper quadrant , no rebound pain, normal bowel sounds Musculoskeletal:no cyanosis of digits and no clubbing  PSYCH: alert & oriented x 3 with fluent speech NEURO: no focal motor deficits, his light touch sensation and vibration sensation are diminished on his hands and feet.   LABORATORY DATA:  I have reviewed the data as listed CBC Latest Ref Rng & Units 01/09/2016 12/26/2015 12/19/2015  WBC 4.0 - 10.3 10e3/uL 4.8 3.3(L) 5.6  Hemoglobin 13.0 - 17.1 g/dL 7.5(L) 7.7(L) 8.5(L)  Hematocrit 38.4 - 49.9 % 24.5(L) 25.3(L) 27.4(L)  Platelets 140 - 400 10e3/uL 233 97(L) 109(L)    CMP Latest Ref Rng & Units 01/09/2016 12/26/2015 12/19/2015  Glucose 70 - 140 mg/dl 105 113 137  BUN 7.0 - 26.0 mg/dL 17.0 20.0 16.1  Creatinine 0.7 - 1.3 mg/dL 1.0 1.0 1.1  Sodium 136 - 145 mEq/L 142 141 141  Potassium 3.5 - 5.1 mEq/L 3.7 3.7 4.0  Chloride 96 - 112 mmol/L - - -  CO2 22 - 29 mEq/L '26 25 26  '$ Calcium 8.4 - 10.4 mg/dL 8.7 8.6 8.7  Total Protein 6.4 - 8.3 g/dL 6.1(L) 6.4 6.2(L)  Total Bilirubin 0.20 - 1.20 mg/dL <0.30 0.34 0.43   Alkaline Phos 40 - 150 U/L 117 128 110  AST 5 - 34 U/L 25 33 23  ALT 0 - 55 U/L '14 26 15    '$ CEA  Order: [id number]  Status:  Final result Visible to patient:  Yes (MyChart) Next appt:  Today at 08:15 AM in Oncology ([facility] ONLY) Dx:  Small bowel cancer ([place])    Ref Range & Units 2wk ago  5moago   CEA 0.0 - 4.7 ng/mL 180.7  153.5CM          Pathology report:  Small Intestine Biopsy, jejunal mass 08/18/2014 - INVASIVE ADENOCARCINOMA IN A BACKGROUND OF TUBULOVILLOUS ADENOMA. ADDITIONAL INFORMATION: Mismatch Repair (MMR) Protein Immunohistochemistry (IHC) IHC Expression Result (LIMITED TUMOR): MLH1: Preserved nuclear expression (greater 50% tumor expression) MSH2: Preserved nuclear expression (greater 50% tumor expression) MSH6: Preserved nuclear expression (greater 50% tumor expression) PMS2: Preserved nuclear expression (greater 50% tumor expression) * Internal control demonstrates intact nuclear expression Interpretation: NORMAL There is preserved expression  Diagnosis 08/28/2014  Liver, needle/core biopsy, right lobe - METASTATIC ADENOCARCINOMA   RADIOGRAPHIC STUDIES: I have personally reviewed the radiological images as listed and agreed with the findings in the report.   CT chest, abdomen and pelvis with contrast 11/26/2015 IMPRESSION: 1. Interval increase in size of liver lesions. 2. Multi focal pulmonary nodules are not significantly changed in the interval. 3. Aortic atherosclerosis.    ASSESSMENT & PLAN:  70year old gentleman with past medical history of diabetes, hypertension, who presents with anemia and weakness.  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  -Unfortunately he has stage IV disease with diffuse liver metastasis, this is an incurable disease, with overall very poor  prognosis. -I reviewed his staging CT scan results from 11/26/2015 with patient and his wife, Unfortunately scan showed disease progression in the liver, his tumor marker CA has been trending up lately, which is consistent with disease progression. -I previously discussed his guardant 360 number testing results, which showed mutations in KRAS, RAF 1, APC, TP 53,  Targeted therapy is nt available at this point. -He is now on saline chemotherapy with gemcitabine. Dose reduced due to significant Pancytopenia. I have decreased dose to 800 Mg/M, 2 weeks on, one-week off -Lab reviewed, adequate for treatment, we'll proceed day C3D1 treatment today -His tumor marker CEA has been trending up, we'll get a restaging CT scan before next cycle in 3 weeks.  2. Microcytic anemia secondary to GI bleeding, iron deficiency and chemotherapy -His serum iron level and saturation were low, although ferritin is normal, he has some degree of iron deficient anemia, and anemia of malignancy  -Consider blood transfusion if hemoglobin less than 7.5 or symptomatic anemia -he received IV feraheme 510 mg twice in 09/2014, repeated iron study was normal on 07/23/2015 -His anemia got worse after he started gemcitabine, hemoglobin 7.5 today, I'll arrange blood transfusion in the next few days. -I encouraged him to take a multivitamin with minerals  3.  Hypertension and DM  -He will continue follow-up with his primary care physician. -We discussed that we will monitor his blood pressure and blood sugar closely and may need to adjust his medication during the chemotherapy  -His diabetes has been better controlled since his insulin was adjusted  4. Peripheral neuropathy, secondary to chemotherapy, G1 -Overall improved. Stable lately. -Continue Neurontin  200 mg 3 times a day -We'll continue observation.   Plan: -continue gemcitabine 800 mg/m, 2 weeks on, one-week off, C1D1 today -2 units RBC in 2 days, no premedication -I'll  see him back in 3 weeks with a restaging CT CAP with contrast   FTruitt Merle 01/09/2016

## 2016-01-09 NOTE — Patient Instructions (Signed)

## 2016-01-09 NOTE — Progress Notes (Signed)
Okay to treat per Dr. Burr Medico with low h/h. Pt to receive prbc on Friday. T& C today.

## 2016-01-09 NOTE — Telephone Encounter (Signed)
Gave pt cal & avs °

## 2016-01-09 NOTE — Progress Notes (Signed)
  Oncology Nurse Navigator Documentation  Navigator Location: [facility] (01/09/16 1215) Navigator Encounter Type: Treatment (01/09/16 1215)           Patient Visit Type: MedOnc (01/09/16 1215) Treatment Phase: Active Tx (01/09/16 1215) Barriers/Navigation Needs: No barriers at this time;No Questions;No Needs (01/09/16 1215)   Interventions: None required (01/09/16 1215)                      Time Spent with Patient: 15 (01/09/16 1215)

## 2016-01-11 LAB — PREPARE RBC (CROSSMATCH)

## 2016-01-13 LAB — TYPE AND SCREEN
ABO/RH(D): A POS
ANTIBODY SCREEN: NEGATIVE
UNIT DIVISION: 0
Unit division: 0
Unit division: 0
Unit division: 0

## 2016-01-16 ENCOUNTER — Ambulatory Visit (HOSPITAL_BASED_OUTPATIENT_CLINIC_OR_DEPARTMENT_OTHER): Payer: Medicare Other

## 2016-01-16 ENCOUNTER — Ambulatory Visit: Payer: Medicare Other

## 2016-01-16 ENCOUNTER — Other Ambulatory Visit (HOSPITAL_BASED_OUTPATIENT_CLINIC_OR_DEPARTMENT_OTHER): Payer: Medicare Other

## 2016-01-16 VITALS — BP 144/59 | HR 57 | Temp 98.3°F | Resp 18

## 2016-01-16 DIAGNOSIS — C179 Malignant neoplasm of small intestine, unspecified: Secondary | ICD-10-CM

## 2016-01-16 DIAGNOSIS — Z5111 Encounter for antineoplastic chemotherapy: Secondary | ICD-10-CM

## 2016-01-16 DIAGNOSIS — Z95828 Presence of other vascular implants and grafts: Secondary | ICD-10-CM

## 2016-01-16 DIAGNOSIS — C787 Secondary malignant neoplasm of liver and intrahepatic bile duct: Secondary | ICD-10-CM | POA: Diagnosis not present

## 2016-01-16 DIAGNOSIS — C772 Secondary and unspecified malignant neoplasm of intra-abdominal lymph nodes: Secondary | ICD-10-CM | POA: Diagnosis not present

## 2016-01-16 LAB — COMPREHENSIVE METABOLIC PANEL
ALBUMIN: 2.9 g/dL — AB (ref 3.5–5.0)
ALK PHOS: 142 U/L (ref 40–150)
ALT: 35 U/L (ref 0–55)
AST: 38 U/L — AB (ref 5–34)
Anion Gap: 9 mEq/L (ref 3–11)
BILIRUBIN TOTAL: 0.39 mg/dL (ref 0.20–1.20)
BUN: 17.4 mg/dL (ref 7.0–26.0)
CALCIUM: 9 mg/dL (ref 8.4–10.4)
CO2: 27 mEq/L (ref 22–29)
Chloride: 108 mEq/L (ref 98–109)
Creatinine: 1 mg/dL (ref 0.7–1.3)
EGFR: 77 mL/min/{1.73_m2} — ABNORMAL LOW (ref >90)
Glucose: 141 mg/dl — ABNORMAL HIGH (ref 70–140)
POTASSIUM: 3.4 meq/L — AB (ref 3.5–5.1)
Sodium: 143 mEq/L (ref 136–145)
Total Protein: 6.4 g/dL (ref 6.4–8.3)

## 2016-01-16 LAB — CBC WITH DIFFERENTIAL/PLATELET
BASO%: 0.4 % (ref 0.0–2.0)
BASOS ABS: 0 10*3/uL (ref 0.0–0.1)
EOS ABS: 0 10*3/uL (ref 0.0–0.5)
EOS%: 0.4 % (ref 0.0–7.0)
HEMATOCRIT: 29.7 % — AB (ref 38.4–49.9)
HEMOGLOBIN: 9.3 g/dL — AB (ref 13.0–17.1)
LYMPH#: 1.3 10*3/uL (ref 0.9–3.3)
LYMPH%: 49.4 % — ABNORMAL HIGH (ref 14.0–49.0)
MCH: 27.8 pg (ref 27.2–33.4)
MCHC: 31.3 g/dL — ABNORMAL LOW (ref 32.0–36.0)
MCV: 88.9 fL (ref 79.3–98.0)
MONO#: 0.4 10*3/uL (ref 0.1–0.9)
MONO%: 16.3 % — ABNORMAL HIGH (ref 0.0–14.0)
NEUT#: 0.9 10*3/uL — ABNORMAL LOW (ref 1.5–6.5)
NEUT%: 33.5 % — ABNORMAL LOW (ref 39.0–75.0)
Platelets: 136 10*3/uL — ABNORMAL LOW (ref 140–400)
RBC: 3.34 10*6/uL — ABNORMAL LOW (ref 4.20–5.82)
RDW: 17.5 % — AB (ref 11.0–14.6)
WBC: 2.6 10*3/uL — ABNORMAL LOW (ref 4.0–10.3)

## 2016-01-16 MED ORDER — HEPARIN SOD (PORK) LOCK FLUSH 100 UNIT/ML IV SOLN
500.0000 [IU] | Freq: Once | INTRAVENOUS | Status: AC | PRN
  Administered 2016-01-16: 500 [IU]
  Filled 2016-01-16: qty 5

## 2016-01-16 MED ORDER — PROCHLORPERAZINE MALEATE 10 MG PO TABS
ORAL_TABLET | ORAL | Status: AC
Start: 2016-01-16 — End: 2016-01-16
  Filled 2016-01-16: qty 1

## 2016-01-16 MED ORDER — SODIUM CHLORIDE 0.9% FLUSH
10.0000 mL | INTRAVENOUS | Status: DC | PRN
  Administered 2016-01-16: 10 mL
  Filled 2016-01-16: qty 10

## 2016-01-16 MED ORDER — SODIUM CHLORIDE 0.9 % IJ SOLN
10.0000 mL | INTRAMUSCULAR | Status: DC | PRN
  Administered 2016-01-16: 10 mL via INTRAVENOUS
  Filled 2016-01-16: qty 10

## 2016-01-16 MED ORDER — PROCHLORPERAZINE MALEATE 10 MG PO TABS
10.0000 mg | ORAL_TABLET | Freq: Once | ORAL | Status: AC
  Administered 2016-01-16: 10 mg via ORAL

## 2016-01-16 MED ORDER — SODIUM CHLORIDE 0.9 % IV SOLN
Freq: Once | INTRAVENOUS | Status: AC
  Administered 2016-01-16: 09:00:00 via INTRAVENOUS

## 2016-01-16 MED ORDER — SODIUM CHLORIDE 0.9 % IV SOLN
800.0000 mg/m2 | Freq: Once | INTRAVENOUS | Status: AC
  Administered 2016-01-16: 1596 mg via INTRAVENOUS
  Filled 2016-01-16: qty 41.98

## 2016-01-16 NOTE — Patient Instructions (Signed)
[facility] Discharge Instructions for Patients Receiving Chemotherapy  Today you received the following chemotherapy agents:  Gemzar  To help prevent nausea and vomiting after your treatment, we encourage you to take your nausea medication. If you develop nausea and vomiting that is not controlled by your nausea medication, call the clinic.   BELOW ARE SYMPTOMS THAT SHOULD BE REPORTED IMMEDIATELY:  *FEVER GREATER THAN 100.5 F  *CHILLS WITH OR WITHOUT FEVER  NAUSEA AND VOMITING THAT IS NOT CONTROLLED WITH YOUR NAUSEA MEDICATION  *UNUSUAL SHORTNESS OF BREATH  *UNUSUAL BRUISING OR BLEEDING  TENDERNESS IN MOUTH AND THROAT WITH OR WITHOUT PRESENCE OF ULCERS  *URINARY PROBLEMS  *BOWEL PROBLEMS  UNUSUAL RASH Items with * indicate a potential emergency and should be followed up as soon as possible.  Feel free to call the clinic you have any questions or concerns. The clinic phone number is [phone].  Please show the [facility] at check-in to the Emergency Department and triage nurse.  Your WBC/ANC are low today.  Please call if you have any symptoms of infection such as fever 100.5 or >, UTI, sore throat, tenderness at port site, etc.  Wash all foods & avoid uncooked meats.  Avoid buffets, crowds in small enclosed areas, & anyone who is sick.

## 2016-01-16 NOTE — Progress Notes (Signed)
OK to treat despite low ANC/WBC per Dr Burr Medico.  Order repeated & verified.

## 2016-01-27 ENCOUNTER — Emergency Department (HOSPITAL_COMMUNITY)
Admission: EM | Admit: 2016-01-27 | Discharge: 2016-01-27 | Disposition: A | Discharge status: Home / Self Care | Payer: Medicare Other | Attending: Emergency Medicine | Admitting: Emergency Medicine

## 2016-01-27 ENCOUNTER — Encounter (HOSPITAL_COMMUNITY): Payer: Self-pay | Admitting: *Deleted

## 2016-01-27 DIAGNOSIS — I10 Essential (primary) hypertension: Secondary | ICD-10-CM | POA: Diagnosis not present

## 2016-01-27 DIAGNOSIS — Z79899 Other long term (current) drug therapy: Secondary | ICD-10-CM | POA: Diagnosis not present

## 2016-01-27 DIAGNOSIS — Z794 Long term (current) use of insulin: Secondary | ICD-10-CM | POA: Insufficient documentation

## 2016-01-27 DIAGNOSIS — R319 Hematuria, unspecified: Secondary | ICD-10-CM | POA: Diagnosis present

## 2016-01-27 DIAGNOSIS — E119 Type 2 diabetes mellitus without complications: Secondary | ICD-10-CM | POA: Insufficient documentation

## 2016-01-27 DIAGNOSIS — N390 Urinary tract infection, site not specified: Secondary | ICD-10-CM | POA: Insufficient documentation

## 2016-01-27 DIAGNOSIS — Z85068 Personal history of other malignant neoplasm of small intestine: Secondary | ICD-10-CM | POA: Insufficient documentation

## 2016-01-27 LAB — CBC WITH DIFFERENTIAL/PLATELET
BASOS PCT: 0 %
Basophils Absolute: 0 10*3/uL (ref 0.0–0.1)
EOS ABS: 0.1 10*3/uL (ref 0.0–0.7)
EOS PCT: 1 %
HEMATOCRIT: 29.9 % — AB (ref 39.0–52.0)
HEMOGLOBIN: 9.5 g/dL — AB (ref 13.0–17.0)
LYMPHS PCT: 21 %
Lymphs Abs: 1.3 10*3/uL (ref 0.7–4.0)
MCH: 27.7 pg (ref 26.0–34.0)
MCHC: 31.8 g/dL (ref 30.0–36.0)
MCV: 87.2 fL (ref 78.0–100.0)
Monocytes Absolute: 0.8 10*3/uL (ref 0.1–1.0)
Monocytes Relative: 12 %
NEUTROS ABS: 4.2 10*3/uL (ref 1.7–7.7)
Neutrophils Relative %: 66 %
Platelets: 135 10*3/uL — ABNORMAL LOW (ref 150–400)
RBC: 3.43 MIL/uL — ABNORMAL LOW (ref 4.22–5.81)
RDW: 17.7 % — ABNORMAL HIGH (ref 11.5–15.5)
WBC: 6.4 10*3/uL (ref 4.0–10.5)

## 2016-01-27 LAB — COMPREHENSIVE METABOLIC PANEL
ALK PHOS: 131 U/L — AB (ref 38–126)
ALT: 27 U/L (ref 17–63)
ANION GAP: 5 (ref 5–15)
AST: 33 U/L (ref 15–41)
Albumin: 3.3 g/dL — ABNORMAL LOW (ref 3.5–5.0)
BILIRUBIN TOTAL: 0.6 mg/dL (ref 0.3–1.2)
BUN: 19 mg/dL (ref 6–20)
CALCIUM: 8.5 mg/dL — AB (ref 8.9–10.3)
CO2: 26 mmol/L (ref 22–32)
CREATININE: 1.05 mg/dL (ref 0.61–1.24)
Chloride: 108 mmol/L (ref 101–111)
GFR calc non Af Amer: >60 mL/min (ref >60)
Glucose, Bld: 121 mg/dL — ABNORMAL HIGH (ref 65–99)
Potassium: 3.6 mmol/L (ref 3.5–5.1)
Sodium: 139 mmol/L (ref 135–145)
TOTAL PROTEIN: 6.6 g/dL (ref 6.5–8.1)

## 2016-01-27 LAB — URINALYSIS, ROUTINE W REFLEX MICROSCOPIC
BILIRUBIN URINE: NEGATIVE
GLUCOSE, UA: NEGATIVE mg/dL
Ketones, ur: NEGATIVE mg/dL
Nitrite: NEGATIVE
PH: 5.5 (ref 5.0–8.0)
Protein, ur: NEGATIVE mg/dL
SPECIFIC GRAVITY, URINE: 1.018 (ref 1.005–1.030)

## 2016-01-27 LAB — URINE MICROSCOPIC-ADD ON

## 2016-01-27 MED ORDER — PHENAZOPYRIDINE HCL 100 MG PO TABS
100.0000 mg | ORAL_TABLET | Freq: Once | ORAL | Status: AC
  Administered 2016-01-27: 100 mg via ORAL
  Filled 2016-01-27: qty 1

## 2016-01-27 MED ORDER — SULFAMETHOXAZOLE-TRIMETHOPRIM 800-160 MG PO TABS: 1.0000 | ORAL_TABLET | Freq: Two times a day (BID) | ORAL | 0 refills | Status: AC

## 2016-01-27 MED ORDER — SULFAMETHOXAZOLE-TRIMETHOPRIM 800-160 MG PO TABS
1.0000 | ORAL_TABLET | Freq: Once | ORAL | Status: AC
  Administered 2016-01-27: 1 via ORAL
  Filled 2016-01-27: qty 1

## 2016-01-27 MED ORDER — PHENAZOPYRIDINE HCL 200 MG PO TABS: 200.0000 mg | ORAL_TABLET | Freq: Three times a day (TID) | ORAL | 0 refills | Status: DC

## 2016-01-27 NOTE — ED Triage Notes (Addendum)
Patient states this morning he woke up and noticed blood on his underwear and when he urinated, he noted blood in his urine and then "it stopped and felt like urine wouldn't pass anymore."  Patient has been unable to urinate since then (7:30 am today).  Patient denies N/V/D and fever.  Patient is actively undergoing chemo treatments for small bowel cancer (01/16/16 last treatment).  Patient states he has had trace amounts of urine in his blood since he began chemo, but he has never in the past been unable to urinate.  Patient denies pain, but endorses urinary urge.

## 2016-01-27 NOTE — ED Provider Notes (Signed)
[facility] DEPT Provider Note   CSN: [id number] Arrival date & time: 01/27/16  0850     History   Chief Complaint Chief Complaint  Patient presents with  . Hematuria    HPI Tyler Evans is a 70 y.o. male. He presents with a chief complaint of hematuria, and urinary retention. He was urinating normally yesterday and noting no blood. This morning he got up as usual. Went to urinate. Started urinating was seen blood in his urine when his stream abruptly stopped. He is not having significant pain. However, he still feels the urge to void.  He reports no history of prostatic issues or other difficulty with urine stream.  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  3/16 was diagnosed via CT scan of small bowel cancer with liver metastases.   4/16 he underwent hospitalization for GI bleed secondary to anemia and thrombocythemia and received 3 units packed blood cells.   9/16 his FOLFOX was discontinued secondary to chest disease progression and neuropathy.   6/17 hisFolFERI was discontinued secondary to rash and disease progression.   7/17 he was started on Gimticabine, on 8/9, 17 the concentration was decreased secondary to pancytopenia and he required 2 units of packed red blood cells for hemoglobin 7.5.    HPI  Past Medical History:  Diagnosis Date  . Diabetes mellitus without complication ([place])   . Hypertension   . Small bowel cancer ([place]) 08/18/2014    Patient Active Problem List   Diagnosis Date Noted  . Port catheter in place 12/05/2015  . 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 Peripheral neuropathy due to chemotherapy ([place]) 06/12/2015  . 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 Diabetes mellitus type 2, uncontrolled ([place]) 04/23/2015  . Hypercholesterolemia without hypertriglyceridemia 04/23/2015  . Drug-induced skin rash 04/09/2015  . Hyperglycemia 09/09/2014  . SIRS (systemic  inflammatory response syndrome) ([place]) 09/08/2014  . Cough   . Protein-calorie malnutrition, severe ([place]) 08/19/2014  . Diabetes mellitus without complication ([place])   . Small bowel cancer ([place]) 08/18/2014  . GIB (gastrointestinal bleeding) 08/17/2014  . Anemia 08/17/2014  . AKI (acute kidney injury) ([place]) 08/17/2014  . Other fatigue 08/17/2014    Past Surgical History:  Procedure Laterality Date  . ENTEROSCOPY N/A 08/18/2014   Procedure: ENTEROSCOPY;  Surgeon: Carol Ada, MD;  Location: [facility];  Service: Endoscopy;  Laterality: N/A;       Home Medications    Prior to Admission medications   Medication Sig Start Date End Date Taking? Authorizing Provider  atorvastatin (LIPITOR) 20 MG tablet Take 20 mg by mouth daily.    Historical Provider, MD  Blood Glucose Monitoring Suppl (ONE TOUCH ULTRA MINI) W/DEVICE KIT  09/27/14   Historical Provider, MD  clindamycin (CLINDAGEL) 1 % gel Apply topically 2 (two) times daily. Patient not taking: Reported on 01/09/2016 03/09/15   Truitt Merle, MD  gabapentin (NEURONTIN) 100 MG capsule TAKE 2 CAPSULES BY MOUTH 3 TIMES A DAY 10/12/15   Truitt Merle, MD  hydrocortisone 2.5 % cream Apply topically 2 (two) times daily. Patient not taking: Reported on 01/09/2016 03/09/15   Truitt Merle, MD  LANTUS SOLOSTAR 100 UNIT/ML Solostar Pen  01/29/15   Historical Provider, MD  lidocaine-prilocaine (EMLA) cream Apply topically as needed. Apply to portacath 1 1/2 hours - 2 hours prior to procedures as needed. 10/23/15   Truitt Merle, MD  lisinopril (PRINIVIL,ZESTRIL) 10 MG tablet Take 10 mg by mouth daily.  09/18/15   Historical Provider, MD  ONE TOUCH ULTRA TEST test strip  09/27/14   Historical Provider, MD  phenazopyridine (PYRIDIUM) 200 MG tablet Take 1 tablet (200 mg total) by mouth 3 (three) times daily. 01/27/16   Tanna Furry, MD  PRESCRIPTION MEDICATION See admin instructions. Receives Vectibix, Leucovorin, and Adrucil chemotherapy every 2 weeks.    Historical Provider, MD    sulfamethoxazole-trimethoprim (BACTRIM DS,SEPTRA DS) 800-160 MG tablet Take 1 tablet by mouth 2 (two) times daily. 01/27/16 02/03/16  Tanna Furry, MD  tamsulosin (FLOMAX) 0.4 MG CAPS capsule Take 0.4 mg by mouth daily. 06/19/15   Historical Provider, MD    Family History Family History  Problem Relation Age of Onset  . Heart failure Mother   . Cancer Father   . Cancer Brother     Social History Social History  Substance Use Topics  . Smoking status: Never Smoker  . Smokeless tobacco: Never Used  . Alcohol use No     Allergies   Penicillins   Review of Systems Review of Systems  Constitutional: Negative for appetite change, chills, diaphoresis, fatigue and fever.  HENT: Negative for mouth sores, sore throat and trouble swallowing.   Eyes: Negative for visual disturbance.  Respiratory: Negative for cough, chest tightness, shortness of breath and wheezing.   Cardiovascular: Negative for chest pain.  Gastrointestinal: Negative for abdominal distention, abdominal pain, diarrhea, nausea and vomiting.  Endocrine: Negative for polydipsia, polyphagia and polyuria.  Genitourinary: Positive for difficulty urinating and hematuria. Negative for dysuria and frequency.  Musculoskeletal: Negative for gait problem.  Skin: Negative for color change, pallor and rash.  Neurological: Negative for dizziness, syncope, light-headedness and headaches.  Hematological: Does not bruise/bleed easily.  Psychiatric/Behavioral: Negative for behavioral problems and confusion.     Physical Exam Updated Vital Signs BP 148/73   Pulse 66   Temp 97.9 F (36.6 C)   Resp 16   Ht _0  (1.676 m)   Wt 183 lb (83 kg)   SpO2 98%   BMI 29.54 kg/m   Physical Exam  Constitutional: He is oriented to person, place, and time. He appears well-developed and well-nourished. No distress.  HENT:  Head: Normocephalic.  Conjunctiva not pale. Nailbeds are pale. Mucosa not pale. No petechiae.  Eyes: Conjunctivae are  normal. Pupils are equal, round, and reactive to light. No scleral icterus.  Neck: Normal range of motion. Neck supple. No thyromegaly present.  Cardiovascular: Normal rate and regular rhythm.  Exam reveals no gallop and no friction rub.   No murmur heard. Pulmonary/Chest: Effort normal and breath sounds normal. No respiratory distress. He has no wheezes. He has no rales.  Abdominal: Soft. Bowel sounds are normal. He exhibits no distension. There is no tenderness. There is no rebound.    Musculoskeletal: Normal range of motion.  Neurological: He is alert and oriented to person, place, and time.  Skin: Skin is warm and dry. No rash noted.  Psychiatric: He has a normal mood and affect. His behavior is normal.     ED Treatments / Results  Labs (all labs ordered are listed, but only abnormal results are displayed) Labs Reviewed  CBC WITH DIFFERENTIAL/PLATELET - Abnormal; Notable for the following:       Result Value   RBC 3.43 (*)    Hemoglobin 9.5 (*)    HCT 29.9 (*)    RDW 17.7 (*)    Platelets 135 (*)    All other components within normal limits  URINALYSIS, ROUTINE W  REFLEX MICROSCOPIC (NOT AT [facility]) - Abnormal; Notable for the following:    APPearance CLOUDY (*)    Hgb urine dipstick LARGE (*)    Leukocytes, UA TRACE (*)    All other components within normal limits  COMPREHENSIVE METABOLIC PANEL - Abnormal; Notable for the following:    Glucose, Bld 121 (*)    Calcium 8.5 (*)    Albumin 3.3 (*)    Alkaline Phosphatase 131 (*)    All other components within normal limits  URINE MICROSCOPIC-ADD ON - Abnormal; Notable for the following:    Squamous Epithelial / LPF 0-5 (*)    Bacteria, UA FEW (*)    All other components within normal limits  URINE CULTURE    EKG  EKG Interpretation None       Radiology No results found.  Procedures Procedures (including critical care time)  Medications Ordered in ED Medications  sulfamethoxazole-trimethoprim (BACTRIM DS,SEPTRA  DS) 800-160 MG per tablet 1 tablet (not administered)  phenazopyridine (PYRIDIUM) tablet 100 mg (not administered)     Initial Impression / Assessment and Plan / ED Course  I have reviewed the triage vital signs and the nursing notes.  Pertinent labs & imaging results that were available during my care of the patient were reviewed by me and considered in my medical decision making (see chart for details).  Clinical Course    Plan will be CBC to evaluate his hemoglobin and platelet counts. Voiding trial. Urinalysis. Reevaluation.  Final Clinical Impressions(s) / ED Diagnoses   Final diagnoses:  UTI (lower urinary tract infection)    Patient was able to void 300 mL which is consistent with approximate amount from bedside limited ultrasound. He has red blood cells wet blood cells and bacteria. Culture pending. Platelet count 135. Hemoglobin 9.5 after his transfusion 2 weeks ago at 7.5. Plan is treatment for UTI with Bactrim Pyridium. He has planned restaging CT in the morning. I've given him copies of his labs to take so that they can avoid repeating labs in a.m.  New Prescriptions New Prescriptions   PHENAZOPYRIDINE (PYRIDIUM) 200 MG TABLET    Take 1 tablet (200 mg total) by mouth 3 (three) times daily.   SULFAMETHOXAZOLE-TRIMETHOPRIM (BACTRIM DS,SEPTRA DS) 800-160 MG TABLET    Take 1 tablet by mouth 2 (two) times daily.     Tanna Furry, MD 01/27/16 1126

## 2016-01-28 ENCOUNTER — Ambulatory Visit (HOSPITAL_COMMUNITY)
Admission: RE | Admit: 2016-01-28 | Discharge: 2016-01-28 | Disposition: A | Discharge status: Home / Self Care | Payer: Medicare Other | Source: Ambulatory Visit | Attending: Hematology | Admitting: Hematology

## 2016-01-28 ENCOUNTER — Other Ambulatory Visit: Payer: Self-pay | Admitting: Hematology

## 2016-01-28 ENCOUNTER — Encounter (HOSPITAL_COMMUNITY): Payer: Self-pay

## 2016-01-28 DIAGNOSIS — C179 Malignant neoplasm of small intestine, unspecified: Secondary | ICD-10-CM | POA: Insufficient documentation

## 2016-01-28 DIAGNOSIS — N400 Enlarged prostate without lower urinary tract symptoms: Secondary | ICD-10-CM | POA: Diagnosis not present

## 2016-01-28 DIAGNOSIS — K5790 Diverticulosis of intestine, part unspecified, without perforation or abscess without bleeding: Secondary | ICD-10-CM | POA: Diagnosis not present

## 2016-01-28 DIAGNOSIS — R918 Other nonspecific abnormal finding of lung field: Secondary | ICD-10-CM | POA: Insufficient documentation

## 2016-01-28 DIAGNOSIS — K8020 Calculus of gallbladder without cholecystitis without obstruction: Secondary | ICD-10-CM | POA: Diagnosis not present

## 2016-01-28 DIAGNOSIS — I700 Atherosclerosis of aorta: Secondary | ICD-10-CM | POA: Insufficient documentation

## 2016-01-28 DIAGNOSIS — C787 Secondary malignant neoplasm of liver and intrahepatic bile duct: Secondary | ICD-10-CM | POA: Diagnosis not present

## 2016-01-28 LAB — URINE CULTURE: CULTURE: NO GROWTH

## 2016-01-28 MED ORDER — IOPAMIDOL (ISOVUE-300) INJECTION 61%
100.0000 mL | Freq: Once | INTRAVENOUS | Status: AC | PRN
  Administered 2016-01-28: 100 mL via INTRAVENOUS

## 2016-01-30 ENCOUNTER — Ambulatory Visit: Payer: Medicare Other

## 2016-01-30 ENCOUNTER — Other Ambulatory Visit (HOSPITAL_BASED_OUTPATIENT_CLINIC_OR_DEPARTMENT_OTHER): Payer: Medicare Other

## 2016-01-30 ENCOUNTER — Ambulatory Visit (HOSPITAL_BASED_OUTPATIENT_CLINIC_OR_DEPARTMENT_OTHER): Payer: Medicare Other | Admitting: Hematology

## 2016-01-30 VITALS — BP 128/52 | HR 63 | Temp 98.6°F | Resp 18 | Ht 66.0 in | Wt 186.3 lb

## 2016-01-30 DIAGNOSIS — C787 Secondary malignant neoplasm of liver and intrahepatic bile duct: Secondary | ICD-10-CM

## 2016-01-30 DIAGNOSIS — C772 Secondary and unspecified malignant neoplasm of intra-abdominal lymph nodes: Secondary | ICD-10-CM

## 2016-01-30 DIAGNOSIS — C179 Malignant neoplasm of small intestine, unspecified: Secondary | ICD-10-CM

## 2016-01-30 DIAGNOSIS — G620 Drug-induced polyneuropathy: Secondary | ICD-10-CM

## 2016-01-30 DIAGNOSIS — D500 Iron deficiency anemia secondary to blood loss (chronic): Secondary | ICD-10-CM

## 2016-01-30 DIAGNOSIS — I10 Essential (primary) hypertension: Secondary | ICD-10-CM

## 2016-01-30 DIAGNOSIS — Z95828 Presence of other vascular implants and grafts: Secondary | ICD-10-CM

## 2016-01-30 DIAGNOSIS — D509 Iron deficiency anemia, unspecified: Secondary | ICD-10-CM

## 2016-01-30 DIAGNOSIS — D6481 Anemia due to antineoplastic chemotherapy: Secondary | ICD-10-CM

## 2016-01-30 DIAGNOSIS — T451X5A Adverse effect of antineoplastic and immunosuppressive drugs, initial encounter: Secondary | ICD-10-CM

## 2016-01-30 DIAGNOSIS — E119 Type 2 diabetes mellitus without complications: Secondary | ICD-10-CM

## 2016-01-30 DIAGNOSIS — D630 Anemia in neoplastic disease: Secondary | ICD-10-CM

## 2016-01-30 LAB — COMPREHENSIVE METABOLIC PANEL
ALBUMIN: 3 g/dL — AB (ref 3.5–5.0)
ALK PHOS: 158 U/L — AB (ref 40–150)
ALT: 23 U/L (ref 0–55)
ANION GAP: 10 meq/L (ref 3–11)
AST: 33 U/L (ref 5–34)
BILIRUBIN TOTAL: 0.37 mg/dL (ref 0.20–1.20)
BUN: 14.2 mg/dL (ref 7.0–26.0)
CO2: 24 meq/L (ref 22–29)
CREATININE: 1.3 mg/dL (ref 0.7–1.3)
Calcium: 9 mg/dL (ref 8.4–10.4)
Chloride: 105 mEq/L (ref 98–109)
EGFR: 52 mL/min/{1.73_m2} — ABNORMAL LOW (ref >90)
GLUCOSE: 170 mg/dL — AB (ref 70–140)
Potassium: 4 mEq/L (ref 3.5–5.1)
SODIUM: 139 meq/L (ref 136–145)
TOTAL PROTEIN: 6.7 g/dL (ref 6.4–8.3)

## 2016-01-30 LAB — CBC WITH DIFFERENTIAL/PLATELET
BASO%: 0.3 % (ref 0.0–2.0)
BASOS ABS: 0 10*3/uL (ref 0.0–0.1)
EOS ABS: 0.1 10*3/uL (ref 0.0–0.5)
EOS%: 1.3 % (ref 0.0–7.0)
HCT: 29.7 % — ABNORMAL LOW (ref 38.4–49.9)
HEMOGLOBIN: 9.4 g/dL — AB (ref 13.0–17.1)
LYMPH#: 0.8 10*3/uL — AB (ref 0.9–3.3)
LYMPH%: 12.7 % — AB (ref 14.0–49.0)
MCH: 27.4 pg (ref 27.2–33.4)
MCHC: 31.6 g/dL — ABNORMAL LOW (ref 32.0–36.0)
MCV: 86.7 fL (ref 79.3–98.0)
MONO#: 0.8 10*3/uL (ref 0.1–0.9)
MONO%: 12.3 % (ref 0.0–14.0)
NEUT%: 73.4 % (ref 39.0–75.0)
NEUTROS ABS: 4.6 10*3/uL (ref 1.5–6.5)
PLATELETS: 204 10*3/uL (ref 140–400)
RBC: 3.43 10*6/uL — AB (ref 4.20–5.82)
RDW: 18.5 % — AB (ref 11.0–14.6)
WBC: 6.2 10*3/uL (ref 4.0–10.3)

## 2016-01-30 LAB — CEA (IN HOUSE-CHCC): CEA (CHCC-In House): 523.87 ng/mL — ABNORMAL HIGH (ref 0.00–5.00)

## 2016-01-30 MED ORDER — GABAPENTIN 100 MG PO CAPS: 200.0000 mg | ORAL_CAPSULE | Freq: Three times a day (TID) | ORAL | 1 refills | Status: DC

## 2016-01-30 MED ORDER — SODIUM CHLORIDE 0.9 % IJ SOLN
10.0000 mL | INTRAMUSCULAR | Status: DC | PRN
  Administered 2016-01-30: 10 mL via INTRAVENOUS
  Filled 2016-01-30: qty 10

## 2016-01-30 MED ORDER — HEPARIN SOD (PORK) LOCK FLUSH 100 UNIT/ML IV SOLN
500.0000 [IU] | Freq: Once | INTRAVENOUS | Status: AC | PRN
  Administered 2016-01-30: 500 [IU] via INTRAVENOUS
  Filled 2016-01-30: qty 5

## 2016-01-30 NOTE — Progress Notes (Signed)
[facility]  [phone] [phone]  Clinic follow up Note   Patient Care Team: Seward Carol, MD as PCP - General (Internal Medicine) 01/30/2016  CHIEF COMPLAINTS:  Follow up small bowel cancer metastatic to liver  Oncology History   Small bowel cancer   Staging form: Small Intestine, AJCC 7th Edition     Clinical: Stage IV (TX, N1, M1) - Unsigned       Small bowel cancer ([place])   08/17/2014 Imaging    CT abdomen/pelvis without contrast showed multiple large masses within the liver and 2.5cm mass within the proximal jejunum.       08/18/2014 Miscellaneous    tumor KRAS mutation (-)      08/18/2014 Pathology Results    Liver biopsy showed metastatic adenocarcinoma, consistent with GI primary      08/18/2014 Initial Diagnosis    Small bowel cancer      08/18/2014 Procedure    small bowel enteroscopy with biopsy by Dr. Benson Norway showed at the proximal jejunum there was evidence of abnormal mucosa and friability, biopsy was obtained.       09/05/2014 Imaging    PET hypermetabolic mass involving a small bowel loop with adjacent mesenteric lymphadenopathy, and diffuse liver metastasis and mild hypermetabolic lymphadenopathy in porta hepatis.      09/06/2014 - 02/01/2015 Chemotherapy    mFOLFOX, stopped due to his neuropathy, and earlier mild disease progression on PET       09/07/2014 - 09/11/2014 Hospital Admission    He was admitted for fever and GI bleeding. Received 3 units of RBC.      10/26/2014 Imaging    Interval significant improvement in the primary small bowel mass, adjacent lymphadenopathy and extensive hepatic metastatic disease. No other new lesions.       02/09/2015 Imaging    PET/CT scan showed stable primary malignancy in the proximal jejunum, mild metabolic progression of the 3 residual metabolic liver metastasis. CT  Portion showed decreased size of his liver metastasis.      02/27/2015 - 11/06/2015 Chemotherapy    second line  chemo FOLFIRI, every 2 weeks, and panitumumab (held for second cycle due to severe skin rashes), chemotherapy stopped due to disease progression.      08/06/2015 Imaging    Stable number and size of the liver lesions. The left lower lobe nodule is considerably less prominent. No other new lesions.      11/26/2015 Progression    Restaging CT chest, abdomen and pelvis with contrast showed interval increase in size of liver lesions, multiple small pulmonary nodules are not significantly changed in the interval. Her tumor marker CEA also increased significantly      12/07/2015 -  Chemotherapy    Gemcitabine 1000 mg/m2 (decreased to 800 mg/m from second dose), and a one and 8 every 21 days        HISTORY OF PRESENTING ILLNESS:  Tyler Evans 70 y.o. male is here because of a recent diagnosis of small bowel cancer. He presented to his PCP on 08/17/2014 with complaints of feeling weak and a one month history of a cough. He was found to be anemic and was referred to the emergency department. Hemoglobin was found to be 7.6, MCV 76.6; creatinine was elevated at 1.74. Stool was Hemoccult positive.  CT abdomen/pelvis without contrast on 08/17/2014 showed multiple large masses within the liver, bulky in appearance. The largest measured approximately 5.7 cm. There appeared to be a focal filling defect within the proximal  jejunum measuring approximately 2.5 x 1.7 cm. There was mild adjacent jejunal wall thickening. The lung bases were clear.  On 08/18/2014 he underwent a small bowel enteroscopy with biopsy by Dr. Benson Norway. The esophagus and gastric lumen were normal. At the proximal jejunum there was evidence of abnormal mucosa and friability. The pediatric colonoscope was not able to traverse the area. Biopsies were obtained. An ultraslim colonoscope was then utilized. This colonoscope was able to traverse the area of stenosis which measured approximately 3 cm in length. 50% of the lumen was ulcerated. Pathology  showed invasive adenocarcinoma in a background of tubulovillous adenoma. MMR stains are pending.  He was discharged home on 08/19/2014.  CURRENT THERAPY: third line chemo with weekly gemcitabine, '100mg'$ /m2 (reduced to '800mg'$ /m2 from second dose due to cytopenia), 2 weeks on and one week off   INTERIM HISTORY:  Mr. Rackley returns for follow-up and review of his recent restaging scan. He developed painless hematuria on 8/27, with mild urgency, no dysuria, urinary frequency, fever or chills. He was seen at ED and was given bactrim and pyridium and discharged home. His hematuria has resolved.  He is otherwise doing well overall, denies significant pain, or other symptoms. His mild to moderate fatigue is stable, no other new complains.    MEDICAL HISTORY:  Past Medical History:  Diagnosis Date  . Diabetes mellitus without complication ([place])   . Hypertension   . Small bowel cancer ([place]) 08/18/2014    SURGICAL HISTORY: Past Surgical History:  Procedure Laterality Date  . ENTEROSCOPY N/A 08/18/2014   Procedure: ENTEROSCOPY;  Surgeon: Carol Ada, MD;  Location: [facility];  Service: Endoscopy;  Laterality: N/A;    SOCIAL HISTORY: History   Social History  . Marital Status: Married    Spouse Name: N/A  . Number of Children: 2  . Years of Education: N/A   Occupational History  .  retired Ambulance person    Social History Main Topics  . Smoking status: Never Smoker   . Smokeless tobacco: Not on file  . Alcohol Use: No  . Drug Use: Not on file  . Sexual Activity: Not on file   Other Topics Concern  . Not on file   Social History Narrative  He lives in [place]. He is married. He has 2 children both reported to be in good health. No tobacco or alcohol use. He is a retired Ambulance person.  FAMILY HISTORY: Family History  Problem Relation Age of Onset  . Heart failure, Alzheimer's  Mother   . Lung cancer Father   . Kidney cancer Brother     ALLERGIES:  is allergic to  penicillins.  MEDICATIONS:  Current Outpatient Prescriptions on File Prior to Visit  Medication Sig Dispense Refill  . atorvastatin (LIPITOR) 20 MG tablet Take 20 mg by mouth daily.    . Blood Glucose Monitoring Suppl (ONE TOUCH ULTRA MINI) W/DEVICE KIT     . clindamycin (CLINDAGEL) 1 % gel Apply topically 2 (two) times daily. (Patient not taking: Reported on 01/09/2016) 30 g 2  . gabapentin (NEURONTIN) 100 MG capsule TAKE 2 CAPSULES BY MOUTH 3 TIMES A DAY 180 capsule 1  . hydrocortisone 2.5 % cream Apply topically 2 (two) times daily. (Patient not taking: Reported on 01/09/2016) 30 g 2  . LANTUS SOLOSTAR 100 UNIT/ML Solostar Pen     . lidocaine-prilocaine (EMLA) cream Apply topically as needed. Apply to portacath 1 1/2 hours - 2 hours prior to procedures as needed. 30 g 2  .  lisinopril (PRINIVIL,ZESTRIL) 10 MG tablet Take 10 mg by mouth daily.  6  . ONE TOUCH ULTRA TEST test strip     . phenazopyridine (PYRIDIUM) 200 MG tablet Take 1 tablet (200 mg total) by mouth 3 (three) times daily. 6 tablet 0  . PRESCRIPTION MEDICATION See admin instructions. Receives Vectibix, Leucovorin, and Adrucil chemotherapy every 2 weeks.    Marland Kitchen sulfamethoxazole-trimethoprim (BACTRIM DS,SEPTRA DS) 800-160 MG tablet Take 1 tablet by mouth 2 (two) times daily. 14 tablet 0  . tamsulosin (FLOMAX) 0.4 MG CAPS capsule Take 0.4 mg by mouth daily.  12   No current facility-administered medications on file prior to visit.   ;  REVIEW OF SYSTEMS:   Constitutional: Denies fevers, chills or abnormal night sweats; mild fatigue, weight is stable.  Eyes: Denies blurriness of vision, double vision or watery eyes Ears, nose, mouth, throat, and face: Denies mucositis or sore throat Respiratory: One month history of a nonproductive cough;  no shortness of breath Cardiovascular: Denies palpitation, chest discomfort or lower extremity swelling Gastrointestinal:  Denies nausea, heartburn. No dysphagia. He has noted less frequent bowel  movements over the past 2 weeks. Does not characterize this as constipation. Skin: Denies abnormal skin rashes Lymphatics: Denies new lymphadenopathy or easy bruising Neurological: Denies numbness, tingling. No extremity weakness Behavioral/Psych: Mood is stable, no new changes  All other systems were reviewed with the patient and are negative.  PHYSICAL EXAMINATION: ECOG PERFORMANCE STATUS: 1  Vitals:   01/30/16 0952  BP: (!) 128/52  Pulse: 63  Resp: 18  Temp: 98.6 F (37 C)   Filed Weights   01/30/16 0952  Weight: 186 lb 4.8 oz (84.5 kg)    GENERAL:alert, no distress and comfortable SKIN: skin color, texture, turgor are normal, (+) skin pigmentation from his rash on his face, neck, chest and upper abdomen.  EYES: normal, conjunctiva are pink and non-injected, sclera clear OROPHARYNX:no exudate, no erythema and lips, buccal mucosa, and tongue normal  NECK: supple, thyroid normal size, non-tender, without nodularity LYMPH:  no palpable lymphadenopathy in the cervical, axillary or inguinal regions LUNGS: clear to auscultation and percussion with normal breathing effort HEART: regular rate & rhythm and no murmurs and no lower extremity edema ABDOMEN:abdomen soft, mild tenderness at the right upper quadrant , no rebound pain, normal bowel sounds Musculoskeletal:no cyanosis of digits and no clubbing  PSYCH: alert & oriented x 3 with fluent speech NEURO: no focal motor deficits, his light touch sensation and vibration sensation are diminished on his hands and feet.   LABORATORY DATA:  I have reviewed the data as listed CBC Latest Ref Rng & Units 01/30/2016 01/27/2016 01/16/2016  WBC 4.0 - 10.3 10e3/uL 6.2 6.4 2.6(L)  Hemoglobin 13.0 - 17.1 g/dL 9.4(L) 9.5(L) 9.3(L)  Hematocrit 38.4 - 49.9 % 29.7(L) 29.9(L) 29.7(L)  Platelets 140 - 400 10e3/uL 204 135(L) 136(L)    CMP Latest Ref Rng & Units 01/30/2016 01/27/2016 01/16/2016  Glucose 70 - 140 mg/dl 170(H) 121(H) 141(H)  BUN 7.0 -  26.0 mg/dL 14.2 19 17.4  Creatinine 0.7 - 1.3 mg/dL 1.3 1.05 1.0  Sodium 136 - 145 mEq/L 139 139 143  Potassium 3.5 - 5.1 mEq/L 4.0 3.6 3.4(L)  Chloride 101 - 111 mmol/L - 108 -  CO2 22 - 29 mEq/L '24 26 27  '$ Calcium 8.4 - 10.4 mg/dL 9.0 8.5(L) 9.0  Total Protein 6.4 - 8.3 g/dL 6.7 6.6 6.4  Total Bilirubin 0.20 - 1.20 mg/dL 0.37 0.6 0.39  Alkaline Phos 40 -  150 U/L 158(H) 131(H) 142  AST 5 - 34 U/L 33 33 38(H)  ALT 0 - 55 U/L 23 27 35   Results for VIGNESH, WILLERT (MRN [id number]) as of 01/30/2016 07:13  Ref. Range 08/06/2015 09:20 09/24/2015 07:51 10/22/2015 08:22 11/26/2015 07:46 12/26/2015 11:09  CEA Latest Ref Range: 0.0 - 4.7 ng/mL 50.5 (H) 74.6 (H) 92.7 (H) 153.5 (H) 180.7 (H)    Pathology report:  Small Intestine Biopsy, jejunal mass 08/18/2014 - INVASIVE ADENOCARCINOMA IN A BACKGROUND OF TUBULOVILLOUS ADENOMA. ADDITIONAL INFORMATION: Mismatch Repair (MMR) Protein Immunohistochemistry (IHC) IHC Expression Result (LIMITED TUMOR): MLH1: Preserved nuclear expression (greater 50% tumor expression) MSH2: Preserved nuclear expression (greater 50% tumor expression) MSH6: Preserved nuclear expression (greater 50% tumor expression) PMS2: Preserved nuclear expression (greater 50% tumor expression) * Internal control demonstrates intact nuclear expression Interpretation: NORMAL There is preserved expression  Diagnosis 08/28/2014  Liver, needle/core biopsy, right lobe - METASTATIC ADENOCARCINOMA       RADIOGRAPHIC STUDIES: I have personally reviewed the radiological images as listed and agreed with the findings in the report.   CT chest, abdomen and pelvis with contrast 01/28/2016 IMPRESSION: Mild increase in hepatic metastatic disease since previous study.  Increased size and number of sub-cm bilateral pulmonary nodules, highly suspicious for pulmonary metastases.  Stable incidental findings including aortic atherosclerosis, cholelithiasis, diverticulosis, and mildly enlarged  prostate.  ASSESSMENT & PLAN:  70 year old gentleman with past medical history of diabetes, hypertension, who presents with anemia and weakness.  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  -Unfortunately he has stage IV disease with diffuse liver metastasis, this is an incurable disease, with overall very poor prognosis. -I reviewed his staging CT scan results from 11/26/2015 with patient and his wife, Unfortunately scan showed disease progression in the liver, his tumor marker CA has been trending up lately, which is consistent with disease progression. -I previously discussed his guardant 360 number testing results, which showed mutations in KRAS, RAF 1, APC, TP 53,  Targeted therapy is nt available at this point. -I discussed and reviewed his restaging CT images from 01/28/2016 which showed disease progression in liver and lungs. I recommend him to stop gemcitabine -I will look for clinical trial options in the region for him. If no trials available, I recommend him to try Xeloda single agent as next line therapy.  -His tumor marker CEA has been trending up, corresponding to his disease progression -He understands his treatment options are limited, and overall prognosis is poor. He does have good PS, and he is motivated for more treatment.   2. Microcytic anemia secondary to GI bleeding, iron deficiency and chemotherapy -His serum iron level and saturation were low, although ferritin is normal, he has some degree of iron deficient anemia, and anemia of malignancy  -Consider blood transfusion if hemoglobin less than 7.5 or symptomatic anemia -he received IV feraheme 510 mg twice in 09/2014, repeated iron study was normal on 07/23/2015 -His anemia got worse after he started gemcitabine, required blood transfusion -I  encouraged him to take a multivitamin with minerals  3.  Hypertension and DM  -He will continue follow-up with his primary care physician. -continue close monitoring   4. Peripheral neuropathy, secondary to chemotherapy, G1 -Overall improved. Stable lately. -Continue Neurontin  200 mg 3 times a day -We'll continue observation.   Plan: -stop gemcitabine due to disease progression -  I will look for clinical trial for him, if not available, will start xeloda '2000mg'$  bid for 2 weeks on and 1 week off  -I will call him next week about the clinical trial search result, and arrange followup   Truitt Merle  01/30/2016

## 2016-01-30 NOTE — Patient Instructions (Signed)

## 2016-01-31 LAB — CEA: CEA: 329.6 ng/mL — ABNORMAL HIGH (ref 0.0–4.7)

## 2016-02-02 ENCOUNTER — Encounter: Payer: Self-pay | Admitting: Hematology

## 2016-02-07 ENCOUNTER — Telehealth: Payer: Self-pay | Admitting: *Deleted

## 2016-02-07 NOTE — Telephone Encounter (Signed)
Received call from pt stating that he was asked to call Dr Burr Medico if he hasn't heard from her about arrangements for a new treatment.  Message to Dr Burr Medico to have her call pt back @ [phone].

## 2016-02-08 ENCOUNTER — Other Ambulatory Visit: Payer: Self-pay | Admitting: Hematology

## 2016-02-08 ENCOUNTER — Telehealth: Payer: Self-pay | Admitting: Pharmacist

## 2016-02-08 MED ORDER — CAPECITABINE 500 MG PO TABS: 1000.0000 mg/m2 | ORAL_TABLET | Freq: Two times a day (BID) | ORAL | 1 refills | Status: DC

## 2016-02-08 MED FILL — CAPECITABINE 500 MG TABLET: 500 | 14 days supply | Qty: 112 | Fill #0

## 2016-02-08 NOTE — Telephone Encounter (Signed)
I called pt back. I have contacted [place] and there is no suitable clinical trials for now. I recommend him to try Xeloda, I will call in today. The potential benefit and side effects were discussed with patient, he agrees to proceed. He will start as long as he receives the medication. I'll see him in 3 weeks before cycle 2.   Truitt Merle MD

## 2016-02-08 NOTE — Telephone Encounter (Signed)
Received Xeloda prescription. Faxed to [facility] outpatient rx. Medication list reviewed for drug interactions and labs reviewed.

## 2016-02-11 ENCOUNTER — Telehealth: Payer: Self-pay | Admitting: *Deleted

## 2016-02-11 ENCOUNTER — Telehealth: Payer: Self-pay | Admitting: Pharmacist

## 2016-02-11 NOTE — Telephone Encounter (Signed)
"  Please tell Dr. Burr Medico I received my Xeloda cancer pills today.  I will start taking them tomorrow morning."

## 2016-02-11 NOTE — Telephone Encounter (Signed)
Received communication from [place]. Xeloda copay = $0. They will call patient when rx is ready. We will f/u for rx pick-up and offer additional counseling - if needed.  Raul Del, PharmD, [place], [facility] [phone]

## 2016-02-12 NOTE — Telephone Encounter (Signed)
I will schedule him to return for lab and follow up on 9/29 or 10/2.   Truitt Merle  02/12/2016

## 2016-02-13 ENCOUNTER — Telehealth: Payer: Self-pay | Admitting: Pharmacist

## 2016-02-13 NOTE — Telephone Encounter (Signed)
Oral Chemotherapy Pharmacist Encounter   I spoke with patient for overview of new oral chemotherapy medication: Xeloda. Start date: 02/12/16  Pt counseling about appropriate administration of 2000mg  (4 tablets) by mouth, BID, taken after a meal for 14 days on followed by a 7 day rest. Side effects discussed including, but not limited to stomach upset, diarrhea, fatigue, and hand-foot syndrome. Pt counseled to take Imodium for diarrhea.  Pt expressed understanding and appreciation. Pt knows to call the office with any questions or concerns.  No follow-up appointments yet, but noted request for appointments on 9/29 or 10/2. Oral Chemo Clinic will follow-up with patient in 10-14 days for compliance/toxicity management and to confirm that MD f/u appointments have been made.  Thank you,  Johny Drilling, PharmD, BCPS 02/13/2016  11:26 AM Oral Chemotherapy Clinic [phone]

## 2016-02-18 ENCOUNTER — Telehealth: Payer: Self-pay | Admitting: *Deleted

## 2016-02-18 NOTE — Telephone Encounter (Signed)
"  Tyler Evans with [facility] in reference to  Capicitabine order for this patient.  We need Diagnosis Code, allergies, Medication list and insurance information faxed to [phone]." Information provided with this call and will fax med list.

## 2016-02-29 ENCOUNTER — Telehealth: Payer: Self-pay | Admitting: Hematology

## 2016-02-29 ENCOUNTER — Other Ambulatory Visit (HOSPITAL_BASED_OUTPATIENT_CLINIC_OR_DEPARTMENT_OTHER): Payer: Medicare Other

## 2016-02-29 ENCOUNTER — Ambulatory Visit (HOSPITAL_BASED_OUTPATIENT_CLINIC_OR_DEPARTMENT_OTHER): Payer: Medicare Other | Admitting: Hematology

## 2016-02-29 ENCOUNTER — Ambulatory Visit: Payer: Medicare Other

## 2016-02-29 VITALS — BP 121/52 | HR 75 | Temp 98.0°F | Resp 18 | Ht 66.0 in | Wt 178.6 lb

## 2016-02-29 DIAGNOSIS — E119 Type 2 diabetes mellitus without complications: Secondary | ICD-10-CM

## 2016-02-29 DIAGNOSIS — C772 Secondary and unspecified malignant neoplasm of intra-abdominal lymph nodes: Secondary | ICD-10-CM

## 2016-02-29 DIAGNOSIS — D630 Anemia in neoplastic disease: Secondary | ICD-10-CM

## 2016-02-29 DIAGNOSIS — C179 Malignant neoplasm of small intestine, unspecified: Secondary | ICD-10-CM | POA: Diagnosis not present

## 2016-02-29 DIAGNOSIS — D6481 Anemia due to antineoplastic chemotherapy: Secondary | ICD-10-CM

## 2016-02-29 DIAGNOSIS — C787 Secondary malignant neoplasm of liver and intrahepatic bile duct: Secondary | ICD-10-CM

## 2016-02-29 DIAGNOSIS — I10 Essential (primary) hypertension: Secondary | ICD-10-CM

## 2016-02-29 DIAGNOSIS — D500 Iron deficiency anemia secondary to blood loss (chronic): Secondary | ICD-10-CM

## 2016-02-29 DIAGNOSIS — T451X5A Adverse effect of antineoplastic and immunosuppressive drugs, initial encounter: Secondary | ICD-10-CM

## 2016-02-29 DIAGNOSIS — Z95828 Presence of other vascular implants and grafts: Secondary | ICD-10-CM

## 2016-02-29 DIAGNOSIS — D509 Iron deficiency anemia, unspecified: Secondary | ICD-10-CM

## 2016-02-29 DIAGNOSIS — G620 Drug-induced polyneuropathy: Secondary | ICD-10-CM

## 2016-02-29 LAB — CBC WITH DIFFERENTIAL/PLATELET
BASO%: 0.1 % (ref 0.0–2.0)
Basophils Absolute: 0 10*3/uL (ref 0.0–0.1)
EOS ABS: 0.4 10*3/uL (ref 0.0–0.5)
EOS%: 3.9 % (ref 0.0–7.0)
HCT: 31.1 % — ABNORMAL LOW (ref 38.4–49.9)
HGB: 10 g/dL — ABNORMAL LOW (ref 13.0–17.1)
LYMPH%: 19.1 % (ref 14.0–49.0)
MCH: 27.3 pg (ref 27.2–33.4)
MCHC: 32.2 g/dL (ref 32.0–36.0)
MCV: 85 fL (ref 79.3–98.0)
MONO#: 0.6 10*3/uL (ref 0.1–0.9)
MONO%: 5.6 % (ref 0.0–14.0)
NEUT#: 7 10*3/uL — ABNORMAL HIGH (ref 1.5–6.5)
NEUT%: 71.3 % (ref 39.0–75.0)
PLATELETS: 156 10*3/uL (ref 140–400)
RBC: 3.66 10*6/uL — AB (ref 4.20–5.82)
RDW: 17.3 % — ABNORMAL HIGH (ref 11.0–14.6)
WBC: 9.8 10*3/uL (ref 4.0–10.3)
lymph#: 1.9 10*3/uL (ref 0.9–3.3)

## 2016-02-29 LAB — COMPREHENSIVE METABOLIC PANEL
ALBUMIN: 3.1 g/dL — AB (ref 3.5–5.0)
ALK PHOS: 126 U/L (ref 40–150)
ALT: 12 U/L (ref 0–55)
ANION GAP: 10 meq/L (ref 3–11)
AST: 21 U/L (ref 5–34)
BUN: 21.9 mg/dL (ref 7.0–26.0)
CALCIUM: 9.1 mg/dL (ref 8.4–10.4)
CO2: 26 mEq/L (ref 22–29)
Chloride: 104 mEq/L (ref 98–109)
Creatinine: 1.2 mg/dL (ref 0.7–1.3)
EGFR: 58 mL/min/{1.73_m2} — AB (ref >90)
Glucose: 245 mg/dl — ABNORMAL HIGH (ref 70–140)
POTASSIUM: 3.9 meq/L (ref 3.5–5.1)
Sodium: 139 mEq/L (ref 136–145)
Total Bilirubin: 0.46 mg/dL (ref 0.20–1.20)
Total Protein: 7 g/dL (ref 6.4–8.3)

## 2016-02-29 LAB — CEA (IN HOUSE-CHCC): CEA (CHCC-IN HOUSE): 985.37 ng/mL — AB (ref 0.00–5.00)

## 2016-02-29 MED ORDER — SODIUM CHLORIDE 0.9 % IJ SOLN
10.0000 mL | INTRAMUSCULAR | Status: DC | PRN
  Administered 2016-02-29: 10 mL via INTRAVENOUS
  Filled 2016-02-29: qty 10

## 2016-02-29 MED ORDER — HEPARIN SOD (PORK) LOCK FLUSH 100 UNIT/ML IV SOLN
500.0000 [IU] | Freq: Once | INTRAVENOUS | Status: AC | PRN
  Administered 2016-02-29: 500 [IU] via INTRAVENOUS
  Filled 2016-02-29: qty 5

## 2016-02-29 MED FILL — CAPECITABINE 500 MG TABLET: 500 | 14 days supply | Qty: 112 | Fill #1

## 2016-02-29 NOTE — Patient Instructions (Signed)

## 2016-02-29 NOTE — Telephone Encounter (Signed)
Gave patient avs report and appointments for October. Per patient no infusions.

## 2016-02-29 NOTE — Progress Notes (Signed)
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       08/18/2014 Miscellaneous    tumor KRAS mutation (-)      08/18/2014 Pathology Results    Liver biopsy showed metastatic adenocarcinoma, consistent with GI primary      08/18/2014 Initial Diagnosis    Small bowel cancer      08/18/2014 Procedure    small bowel enteroscopy with biopsy by Dr. Benson Norway showed at the proximal jejunum there was evidence of abnormal mucosa and friability, biopsy was obtained.       09/05/2014 Imaging    PET hypermetabolic mass involving a small bowel loop with adjacent mesenteric lymphadenopathy, and diffuse liver metastasis and mild hypermetabolic lymphadenopathy in porta hepatis.      09/06/2014 - 02/01/2015 Chemotherapy    mFOLFOX, stopped due to his neuropathy, and earlier mild disease progression on PET       09/07/2014 - 09/11/2014 Hospital Admission    He was admitted for fever and GI bleeding. Received 3 units of RBC.      10/26/2014 Imaging    Interval significant improvement in the primary small bowel mass, adjacent lymphadenopathy and extensive hepatic metastatic disease. No other new lesions.       02/09/2015 Imaging    PET/CT scan showed stable primary malignancy in the proximal jejunum, mild metabolic progression of the 3 residual metabolic liver metastasis. CT  Portion showed decreased size of his liver metastasis.      02/27/2015 - 11/06/2015 Chemotherapy    second line  chemo FOLFIRI, every 2 weeks, and panitumumab (held for second cycle due to severe skin rashes), chemotherapy stopped due to disease progression.      08/06/2015 Imaging    Stable number and size of the liver lesions. The left lower lobe nodule is considerably less prominent. No other new lesions.      11/26/2015 Progression    Restaging CT chest, abdomen and pelvis with contrast showed interval increase in size of liver lesions, multiple small pulmonary nodules are not significantly changed in the interval. Her tumor marker CEA also increased significantly      12/07/2015 - 01/16/2016 Chemotherapy    Gemcitabine 1000 mg/m2 (decreased to 800 mg/m from second dose), and a one and 8 every 21 days, stopped due to disease progression       02/12/2016 -  Chemotherapy    Xeloda '1000mg'$ /m2 twice daily, 2 weeks on, one-week off        HISTORY OF PRESENTING ILLNESS:  Tyler Evans 70 y.o. male is here because of a recent diagnosis of small bowel cancer. He presented to his PCP on 08/17/2014 with complaints of feeling weak and a one month history of a cough. He was found to be anemic and was referred to the emergency department. Hemoglobin was found to be 7.6, MCV 76.6; creatinine was elevated at 1.74. Stool was Hemoccult positive.  CT abdomen/pelvis without contrast on 08/17/2014  showed multiple large masses within the liver, bulky in appearance. The largest measured approximately 5.7 cm. There appeared to be a focal filling defect within the proximal jejunum measuring approximately 2.5 x 1.7 cm. There was mild adjacent jejunal wall thickening. The lung bases were clear.  On 08/18/2014 he underwent a small bowel enteroscopy with biopsy by Dr. Benson Norway. The esophagus and gastric lumen were normal. At the proximal jejunum there was evidence of abnormal mucosa and friability. The pediatric colonoscope was not able to traverse the area. Biopsies were obtained. An ultraslim colonoscope was then utilized. This  colonoscope was able to traverse the area of stenosis which measured approximately 3 cm in length. 50% of the lumen was ulcerated. 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 MMR stains are pending.  He was discharged home on 08/19/2014.  CURRENT THERAPY: Xeloda 2000 mg twice daily, 2 weeks on, 1 week off, started on 02/12/2016.  INTERIM HISTORY:  [name] returns for follow-up. He is accompanied by his wife to the clinic today. He started oral chemotherapy Xeloda on 02/12/2016, tolerated very well, and has completed the first cycle. He noticed dark skin and nail on her hands and feet, he has mild to moderate numbness on his fingers and toes, slightly worse since he started chemotherapy his hand function operating a machine normal, no issues with his balance and gait. No other new complaints.    MEDICAL HISTORY:  Past Medical History:  Diagnosis Date  . Diabetes mellitus without complication ([place])   . Hypertension   . Small bowel cancer ([place]) 08/18/2014    SURGICAL HISTORY: Past Surgical History:  Procedure Laterality Date  . ENTEROSCOPY N/A 08/18/2014   Procedure: ENTEROSCOPY;  Surgeon: Carol Ada, MD;  Location: [facility] END[facility];  Service: Endoscopy;  Laterality: N/A;    SOCIAL HISTORY: History   Social History  . Marital Status: Married    Spouse Name: N/A  . Number of Children: 2  . Years of Education: N/A   Occupational History  .  retired Ambulance person    Social History Main Topics  . Smoking status: Never Smoker   . Smokeless tobacco: Not on file  . Alcohol Use: No  . Drug Use: Not on file  . Sexual Activity: Not on file   Other Topics Concern  . Not on file   Social History Narrative  He lives in [place]. He is married. He has 2 children both reported to be in good health. No tobacco or alcohol use. He is a retired Ambulance person.  FAMILY HISTORY: Family History  Problem Relation Age of Onset  . Heart failure,  Alzheimer's  Mother   . Lung cancer Father   . Kidney cancer Brother     ALLERGIES:  is allergic to penicillins.  MEDICATIONS:  Current Outpatient Prescriptions on File Prior to Visit  Medication Sig Dispense Refill  . atorvastatin (LIPITOR) 20 MG tablet Take 20 mg by mouth daily.    . Blood Glucose Monitoring Suppl (ONE TOUCH ULTRA MINI) W/DEVICE KIT     . capecitabine (XELODA) 500 MG tablet Take 4 tablets (2,000 mg total) by mouth 2 (two) times daily after a meal. 112 tablet 1  . clindamycin (CLINDAGEL) 1 % gel Apply topically 2 (two) times daily. 30 g 2  . gabapentin (NEURONTIN) 100 MG capsule Take 2 capsules (200 mg total) by mouth 3 (three) times daily. 180 capsule 1  . Glucose Blood (FREESTYLE LITE TEST VI)     . hydrocortisone  2.5 % cream Apply topically 2 (two) times daily. 30 g 2  . LANTUS SOLOSTAR 100 UNIT/ML Solostar Pen     . lidocaine-prilocaine (EMLA) cream Apply topically as needed. Apply to portacath 1 1/2 hours - 2 hours prior to procedures as needed. 30 g 2  . lisinopril (PRINIVIL,ZESTRIL) 10 MG tablet Take 10 mg by mouth daily.  6  . ONE TOUCH ULTRA TEST test strip     . phenazopyridine (PYRIDIUM) 200 MG tablet Take 1 tablet (200 mg total) by mouth 3 (three) times daily. 6 tablet 0  . PRESCRIPTION MEDICATION See admin instructions. Receives Vectibix, Leucovorin, and Adrucil chemotherapy every 2 weeks.    . tamsulosin (FLOMAX) 0.4 MG CAPS capsule Take 0.4 mg by mouth daily.  12   No current facility-administered medications on file prior to visit.   ;  REVIEW OF SYSTEMS:   Constitutional: Denies fevers, chills or abnormal night sweats; mild fatigue, weight is stable.  Eyes: Denies blurriness of vision, double vision or watery eyes Ears, nose, mouth, throat, and face: Denies mucositis or sore throat Respiratory: One month history of a nonproductive cough;  no shortness of breath Cardiovascular: Denies palpitation, chest discomfort or lower extremity  swelling Gastrointestinal:  Denies nausea, heartburn. No dysphagia. He has noted less frequent bowel movements over the past 2 weeks. Does not characterize this as constipation. Skin: Denies abnormal skin rashes Lymphatics: Denies new lymphadenopathy or easy bruising Neurological: Denies numbness, tingling. No extremity weakness Behavioral/Psych: Mood is stable, no new changes  All other systems were reviewed with the patient and are negative.  PHYSICAL EXAMINATION: ECOG PERFORMANCE STATUS: 1  Vitals:   02/29/16 1313  BP: (!) 121/52  Pulse: 75  Resp: 18  Temp: 98 F (36.7 C)   Filed Weights   02/29/16 1313  Weight: 178 lb 9.6 oz (81 kg)    GENERAL:alert, no distress and comfortable SKIN: skin color, texture, turgor are normal, (+) skin pigmentation from his rash on his face, neck, chest and upper abdomen.  EYES: normal, conjunctiva are pink and non-injected, sclera clear OROPHARYNX:no exudate, no erythema and lips, buccal mucosa, and tongue normal  NECK: supple, thyroid normal size, non-tender, without nodularity LYMPH:  no palpable lymphadenopathy in the cervical, axillary or inguinal regions LUNGS: clear to auscultation and percussion with normal breathing effort HEART: regular rate & rhythm and no murmurs and no lower extremity edema ABDOMEN:abdomen soft, mild tenderness at the right upper quadrant , no rebound pain, normal bowel sounds Musculoskeletal:no cyanosis of digits and no clubbing  PSYCH: alert & oriented x 3 with fluent speech NEURO: no focal motor deficits, his light touch sensation and vibration sensation are diminished on his hands and feet.   LABORATORY DATA:  I have reviewed the data as listed CBC Latest Ref Rng & Units 02/29/2016 01/30/2016 01/27/2016  WBC 4.0 - 10.3 10e3/uL 9.8 6.2 6.4  Hemoglobin 13.0 - 17.1 g/dL 10.0(L) 9.4(L) 9.5(L)  Hematocrit 38.4 - 49.9 % 31.1(L) 29.7(L) 29.9(L)  Platelets 140 - 400 10e3/uL 156 204 135(L)    CMP Latest Ref Rng &  Units 02/29/2016 01/30/2016 01/27/2016  Glucose 70 - 140 mg/dl 245(H) 170(H) 121(H)  BUN 7.0 - 26.0 mg/dL 21.9 14.2 19  Creatinine 0.7 - 1.3 mg/dL 1.2 1.3 1.05  Sodium 136 - 145 mEq/L 139 139 139  Potassium 3.5 - 5.1 mEq/L 3.9 4.0 3.6  Chloride 101 - 111 mmol/L - - 108  CO2 22 - 29 mEq/L '26 24 26  '$ Calcium 8.4 -  10.4 mg/dL 9.1 9.0 8.5(L)  Total Protein 6.4 - 8.3 g/dL 7.0 6.7 6.6  Total Bilirubin 0.20 - 1.20 mg/dL 0.46 0.37 0.6  Alkaline Phos 40 - 150 U/L 126 158(H) 131(H)  AST 5 - 34 U/L 21 33 33  ALT 0 - 55 U/L '12 23 27   '$ Results for IYAD, DEROO (MRN [id number]) as of 03/02/2016 11:14  Ref. Range 04/23/2015 08:35 05/29/2015 08:20 06/25/2015 11:24 07/09/2015 13:43  CEA Latest Units: ng/mL 9.6 (H) 17.6 (H) 20.2 (H) 25.0   Pathology report:  Small Intestine Biopsy, jejunal mass 08/18/2014 - INVASIVE ADENOCARCINOMA IN A BACKGROUND OF TUBULOVILLOUS ADENOMA. ADDITIONAL INFORMATION: Mismatch Repair (MMR) Protein Immunohistochemistry (IHC) IHC Expression Result (LIMITED TUMOR): MLH1: Preserved nuclear expression (greater 50% tumor expression) MSH2: Preserved nuclear expression (greater 50% tumor expression) MSH6: Preserved nuclear expression (greater 50% tumor expression) PMS2: Preserved nuclear expression (greater 50% tumor expression) * Internal control demonstrates intact nuclear expression Interpretation: NORMAL There is preserved expression  Diagnosis 08/28/2014  Liver, needle/core biopsy, right lobe - METASTATIC ADENOCARCINOMA       RADIOGRAPHIC STUDIES: I have personally reviewed the radiological images as listed and agreed with the findings in the report.   CT chest, abdomen and pelvis with contrast 01/28/2016 IMPRESSION: Mild increase in hepatic metastatic disease since previous study.  Increased size and number of sub-cm bilateral pulmonary nodules, highly suspicious for pulmonary metastases.  Stable incidental findings including aortic  atherosclerosis, cholelithiasis, diverticulosis, and mildly enlarged prostate.  ASSESSMENT & PLAN:  70 year old gentleman with past medical history of diabetes, hypertension, who presents with anemia and weakness.  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  -Unfortunately he has stage IV disease with diffuse liver metastasis, this is an incurable disease, with overall very poor prognosis. -I reviewed his staging CT scan results from 11/26/2015 with patient and his wife, Unfortunately scan showed disease progression in the liver, his tumor marker CA has been trending up lately, which is consistent with disease progression. -I previously discussed his guardant 360 number testing results, which showed mutations in KRAS, RAF 1, APC, TP 53,  Targeted therapy is nt available at this point. -I discussed and reviewed his restaging CT images from 01/28/2016 which showed disease progression in liver and lungs. I recommend him to stop gemcitabine -His tumor marker CEA has been trending up, corresponding to his disease progression -I have checked the clinical trial options at [facility] and [facility], unfortunately there is no suitable trials for him now.  - he is now on oral Xeloda, tolerating well, we'll continue   2. Microcytic anemia secondary to GI bleeding, iron deficiency and chemotherapy -His serum iron level and saturation were low, although ferritin is normal, he has some degree of iron deficient anemia, and anemia of malignancy  -Consider blood transfusion if hemoglobin less than 7.5 or symptomatic anemia -he received IV feraheme 510 mg twice in 09/2014, repeated iron study was normal on 07/23/2015 -His anemia got worse after he started gemcitabine, required blood transfusion -I encouraged him to take a multivitamin with  minerals  3.  Hypertension and DM  -He will continue follow-up with his primary care physician. -continue close monitoring   4. Peripheral neuropathy, secondary to chemotherapy, G1 -Overall improved. Stable lately. -Continue Neurontin  200 mg 3 times a day -We'll continue observation.   Plan: -Lab reviewed, adequate for treatment. He will  start cycle 2 Xeloda on 03/04/2016 -I will see him back in 3 weeks with lab   Truitt Merle  02/29/2016

## 2016-03-01 LAB — CEA: CEA1: 605.9 ng/mL — AB (ref 0.0–4.7)

## 2016-03-02 ENCOUNTER — Encounter: Payer: Self-pay | Admitting: Hematology

## 2016-03-19 ENCOUNTER — Observation Stay (HOSPITAL_COMMUNITY)
Admission: EM | Admit: 2016-03-19 | Discharge: 2016-03-21 | Disposition: A | Discharge status: Home / Self Care | Payer: Medicare Other | Attending: Family Medicine | Admitting: Family Medicine

## 2016-03-19 ENCOUNTER — Emergency Department (HOSPITAL_COMMUNITY): Payer: Medicare Other

## 2016-03-19 ENCOUNTER — Encounter (HOSPITAL_COMMUNITY): Payer: Self-pay | Admitting: *Deleted

## 2016-03-19 DIAGNOSIS — Z79899 Other long term (current) drug therapy: Secondary | ICD-10-CM | POA: Insufficient documentation

## 2016-03-19 DIAGNOSIS — C179 Malignant neoplasm of small intestine, unspecified: Secondary | ICD-10-CM | POA: Diagnosis not present

## 2016-03-19 DIAGNOSIS — K922 Gastrointestinal hemorrhage, unspecified: Secondary | ICD-10-CM

## 2016-03-19 DIAGNOSIS — K529 Noninfective gastroenteritis and colitis, unspecified: Secondary | ICD-10-CM | POA: Diagnosis not present

## 2016-03-19 DIAGNOSIS — Z95828 Presence of other vascular implants and grafts: Secondary | ICD-10-CM

## 2016-03-19 DIAGNOSIS — E871 Hypo-osmolality and hyponatremia: Secondary | ICD-10-CM

## 2016-03-19 DIAGNOSIS — R1030 Lower abdominal pain, unspecified: Secondary | ICD-10-CM | POA: Diagnosis present

## 2016-03-19 DIAGNOSIS — E43 Unspecified severe protein-calorie malnutrition: Secondary | ICD-10-CM

## 2016-03-19 DIAGNOSIS — R112 Nausea with vomiting, unspecified: Secondary | ICD-10-CM

## 2016-03-19 DIAGNOSIS — R739 Hyperglycemia, unspecified: Secondary | ICD-10-CM

## 2016-03-19 DIAGNOSIS — D638 Anemia in other chronic diseases classified elsewhere: Secondary | ICD-10-CM

## 2016-03-19 DIAGNOSIS — R197 Diarrhea, unspecified: Secondary | ICD-10-CM

## 2016-03-19 DIAGNOSIS — E860 Dehydration: Secondary | ICD-10-CM

## 2016-03-19 DIAGNOSIS — E7800 Pure hypercholesterolemia, unspecified: Secondary | ICD-10-CM

## 2016-03-19 DIAGNOSIS — E119 Type 2 diabetes mellitus without complications: Secondary | ICD-10-CM

## 2016-03-19 DIAGNOSIS — D649 Anemia, unspecified: Secondary | ICD-10-CM | POA: Diagnosis not present

## 2016-03-19 DIAGNOSIS — G620 Drug-induced polyneuropathy: Secondary | ICD-10-CM | POA: Diagnosis present

## 2016-03-19 DIAGNOSIS — E1165 Type 2 diabetes mellitus with hyperglycemia: Secondary | ICD-10-CM | POA: Insufficient documentation

## 2016-03-19 DIAGNOSIS — I10 Essential (primary) hypertension: Secondary | ICD-10-CM | POA: Diagnosis not present

## 2016-03-19 DIAGNOSIS — L270 Generalized skin eruption due to drugs and medicaments taken internally: Secondary | ICD-10-CM

## 2016-03-19 DIAGNOSIS — T451X5A Adverse effect of antineoplastic and immunosuppressive drugs, initial encounter: Secondary | ICD-10-CM

## 2016-03-19 LAB — C DIFFICILE QUICK SCREEN W PCR REFLEX
C Diff antigen: NEGATIVE
C Diff interpretation: NOT DETECTED
C Diff toxin: NEGATIVE

## 2016-03-19 LAB — URINALYSIS, ROUTINE W REFLEX MICROSCOPIC
Glucose, UA: 100 mg/dL — AB
HGB URINE DIPSTICK: NEGATIVE
Ketones, ur: NEGATIVE mg/dL
Leukocytes, UA: NEGATIVE
NITRITE: NEGATIVE
PH: 6 (ref 5.0–8.0)
Protein, ur: 30 mg/dL — AB

## 2016-03-19 LAB — CBC WITH DIFFERENTIAL/PLATELET
Basophils Absolute: 0 10*3/uL (ref 0.0–0.1)
Basophils Relative: 0 %
EOS ABS: 0.1 10*3/uL (ref 0.0–0.7)
Eosinophils Relative: 2 %
HEMATOCRIT: 27.5 % — AB (ref 39.0–52.0)
HEMOGLOBIN: 9 g/dL — AB (ref 13.0–17.0)
LYMPHS ABS: 1.1 10*3/uL (ref 0.7–4.0)
Lymphocytes Relative: 20 %
MCH: 27.5 pg (ref 26.0–34.0)
MCHC: 32.7 g/dL (ref 30.0–36.0)
MCV: 84.1 fL (ref 78.0–100.0)
MONOS PCT: 6 %
Monocytes Absolute: 0.3 10*3/uL (ref 0.1–1.0)
NEUTROS ABS: 3.8 10*3/uL (ref 1.7–7.7)
NEUTROS PCT: 72 %
Platelets: 196 10*3/uL (ref 150–400)
RBC: 3.27 MIL/uL — AB (ref 4.22–5.81)
RDW: 18.4 % — ABNORMAL HIGH (ref 11.5–15.5)
WBC: 5.3 10*3/uL (ref 4.0–10.5)

## 2016-03-19 LAB — COMPREHENSIVE METABOLIC PANEL
ALBUMIN: 3.2 g/dL — AB (ref 3.5–5.0)
ALK PHOS: 59 U/L (ref 38–126)
ALT: 12 U/L — ABNORMAL LOW (ref 17–63)
ANION GAP: 10 (ref 5–15)
AST: 20 U/L (ref 15–41)
BUN: 24 mg/dL — ABNORMAL HIGH (ref 6–20)
CHLORIDE: 99 mmol/L — AB (ref 101–111)
CO2: 24 mmol/L (ref 22–32)
Calcium: 8.7 mg/dL — ABNORMAL LOW (ref 8.9–10.3)
Creatinine, Ser: 1.13 mg/dL (ref 0.61–1.24)
GFR calc non Af Amer: >60 mL/min (ref >60)
GLUCOSE: 272 mg/dL — AB (ref 65–99)
POTASSIUM: 3.7 mmol/L (ref 3.5–5.1)
SODIUM: 133 mmol/L — AB (ref 135–145)
Total Bilirubin: 1.4 mg/dL — ABNORMAL HIGH (ref 0.3–1.2)
Total Protein: 6.2 g/dL — ABNORMAL LOW (ref 6.5–8.1)

## 2016-03-19 LAB — URINE MICROSCOPIC-ADD ON

## 2016-03-19 LAB — POC OCCULT BLOOD, ED: FECAL OCCULT BLD: POSITIVE — AB

## 2016-03-19 LAB — LIPASE, BLOOD: Lipase: 22 U/L (ref 11–51)

## 2016-03-19 LAB — GLUCOSE, CAPILLARY
GLUCOSE-CAPILLARY: 207 mg/dL — AB (ref 65–99)
Glucose-Capillary: 181 mg/dL — ABNORMAL HIGH (ref 65–99)

## 2016-03-19 LAB — I-STAT CG4 LACTIC ACID, ED: Lactic Acid, Venous: 1.51 mmol/L (ref 0.5–1.9)

## 2016-03-19 MED ORDER — FAMOTIDINE IN NACL 20-0.9 MG/50ML-% IV SOLN
20.0000 mg | Freq: Two times a day (BID) | INTRAVENOUS | Status: DC
  Administered 2016-03-19 – 2016-03-20 (×3): 20 mg via INTRAVENOUS
  Filled 2016-03-19 (×3): qty 50

## 2016-03-19 MED ORDER — IOPAMIDOL (ISOVUE-300) INJECTION 61%
100.0000 mL | Freq: Once | INTRAVENOUS | Status: AC | PRN
  Administered 2016-03-19: 100 mL via INTRAVENOUS

## 2016-03-19 MED ORDER — INSULIN ASPART 100 UNIT/ML ~~LOC~~ SOLN
0.0000 [IU] | Freq: Three times a day (TID) | SUBCUTANEOUS | Status: DC
  Administered 2016-03-19 – 2016-03-21 (×5): 2 [IU] via SUBCUTANEOUS
  Administered 2016-03-21: 1 [IU] via SUBCUTANEOUS

## 2016-03-19 MED ORDER — SODIUM CHLORIDE 0.9 % IV BOLUS (SEPSIS)
1000.0000 mL | Freq: Once | INTRAVENOUS | Status: AC
  Administered 2016-03-19: 1000 mL via INTRAVENOUS

## 2016-03-19 MED ORDER — PIPERACILLIN-TAZOBACTAM 3.375 G IVPB 30 MIN
3.3750 g | Freq: Once | INTRAVENOUS | Status: DC
  Filled 2016-03-19: qty 50

## 2016-03-19 MED ORDER — ONDANSETRON HCL 4 MG PO TABS: 4.0000 mg | ORAL_TABLET | Freq: Four times a day (QID) | ORAL | Status: DC | PRN

## 2016-03-19 MED ORDER — INSULIN GLARGINE 100 UNIT/ML ~~LOC~~ SOLN
10.0000 [IU] | Freq: Every day | SUBCUTANEOUS | Status: DC
  Administered 2016-03-19: 10 [IU] via SUBCUTANEOUS
  Filled 2016-03-19 (×2): qty 0.1

## 2016-03-19 MED ORDER — ATORVASTATIN CALCIUM 10 MG PO TABS
20.0000 mg | ORAL_TABLET | Freq: Every evening | ORAL | Status: DC
  Administered 2016-03-19 – 2016-03-20 (×2): 20 mg via ORAL
  Filled 2016-03-19: qty 2
  Filled 2016-03-19: qty 1
  Filled 2016-03-19: qty 2
  Filled 2016-03-19: qty 1

## 2016-03-19 MED ORDER — ACETAMINOPHEN 650 MG RE SUPP: 650.0000 mg | Freq: Four times a day (QID) | RECTAL | Status: DC | PRN

## 2016-03-19 MED ORDER — METRONIDAZOLE IN NACL 5-0.79 MG/ML-% IV SOLN
500.0000 mg | Freq: Once | INTRAVENOUS | Status: DC
  Filled 2016-03-19: qty 100

## 2016-03-19 MED ORDER — PIPERACILLIN-TAZOBACTAM 3.375 G IVPB: 3.3750 g | Freq: Three times a day (TID) | INTRAVENOUS | Status: DC

## 2016-03-19 MED ORDER — INSULIN ASPART 100 UNIT/ML ~~LOC~~ SOLN: 0.0000 [IU] | Freq: Every day | SUBCUTANEOUS | Status: DC

## 2016-03-19 MED ORDER — CIPROFLOXACIN IN D5W 400 MG/200ML IV SOLN
400.0000 mg | Freq: Two times a day (BID) | INTRAVENOUS | Status: DC
  Administered 2016-03-19 – 2016-03-20 (×2): 400 mg via INTRAVENOUS
  Filled 2016-03-19: qty 200

## 2016-03-19 MED ORDER — SODIUM CHLORIDE 0.9 % IV SOLN
INTRAVENOUS | Status: DC
  Administered 2016-03-19 – 2016-03-20 (×3): via INTRAVENOUS
  Administered 2016-03-20: 1000 mL via INTRAVENOUS
  Administered 2016-03-21: 06:00:00 via INTRAVENOUS

## 2016-03-19 MED ORDER — TAMSULOSIN HCL 0.4 MG PO CAPS
0.4000 mg | ORAL_CAPSULE | Freq: Every evening | ORAL | Status: DC
  Administered 2016-03-19 – 2016-03-20 (×2): 0.4 mg via ORAL
  Filled 2016-03-19 (×2): qty 1

## 2016-03-19 MED ORDER — CIPROFLOXACIN IN D5W 400 MG/200ML IV SOLN
400.0000 mg | Freq: Once | INTRAVENOUS | Status: DC
  Filled 2016-03-19: qty 200

## 2016-03-19 MED ORDER — MORPHINE SULFATE (PF) 4 MG/ML IV SOLN
4.0000 mg | Freq: Once | INTRAVENOUS | Status: AC
Start: 2016-03-19 — End: 2016-03-19
  Administered 2016-03-19: 4 mg via INTRAVENOUS
  Filled 2016-03-19: qty 1

## 2016-03-19 MED ORDER — ONDANSETRON HCL 4 MG/2ML IJ SOLN: 4.0000 mg | Freq: Four times a day (QID) | INTRAMUSCULAR | Status: DC | PRN

## 2016-03-19 MED ORDER — GABAPENTIN 100 MG PO CAPS
200.0000 mg | ORAL_CAPSULE | Freq: Three times a day (TID) | ORAL | Status: DC
  Administered 2016-03-19 – 2016-03-21 (×6): 200 mg via ORAL
  Filled 2016-03-19 (×6): qty 2

## 2016-03-19 MED ORDER — ONDANSETRON HCL 4 MG/2ML IJ SOLN
4.0000 mg | Freq: Once | INTRAMUSCULAR | Status: AC
  Administered 2016-03-19: 4 mg via INTRAVENOUS
  Filled 2016-03-19: qty 2

## 2016-03-19 MED ORDER — HYDROCODONE-ACETAMINOPHEN 5-325 MG PO TABS: 1.0000 | ORAL_TABLET | ORAL | Status: DC | PRN

## 2016-03-19 MED ORDER — MORPHINE SULFATE (PF) 2 MG/ML IV SOLN
2.0000 mg | INTRAVENOUS | Status: DC | PRN
  Administered 2016-03-19: 2 mg via INTRAVENOUS
  Filled 2016-03-19: qty 1

## 2016-03-19 MED ORDER — ENOXAPARIN SODIUM 40 MG/0.4ML ~~LOC~~ SOLN
40.0000 mg | SUBCUTANEOUS | Status: DC
  Administered 2016-03-19 – 2016-03-20 (×2): 40 mg via SUBCUTANEOUS
  Filled 2016-03-19 (×2): qty 0.4

## 2016-03-19 MED ORDER — METRONIDAZOLE IN NACL 5-0.79 MG/ML-% IV SOLN
500.0000 mg | Freq: Three times a day (TID) | INTRAVENOUS | Status: DC
  Administered 2016-03-19 – 2016-03-20 (×4): 500 mg via INTRAVENOUS
  Filled 2016-03-19 (×4): qty 100

## 2016-03-19 MED ORDER — PANTOPRAZOLE SODIUM 40 MG PO TBEC
40.0000 mg | DELAYED_RELEASE_TABLET | Freq: Two times a day (BID) | ORAL | Status: DC
  Administered 2016-03-19 – 2016-03-21 (×4): 40 mg via ORAL
  Filled 2016-03-19 (×5): qty 1

## 2016-03-19 MED ORDER — ACETAMINOPHEN 325 MG PO TABS: 650.0000 mg | ORAL_TABLET | Freq: Four times a day (QID) | ORAL | Status: DC | PRN

## 2016-03-19 MED ORDER — BOOST / RESOURCE BREEZE PO LIQD
1.0000 | Freq: Three times a day (TID) | ORAL | Status: DC
  Administered 2016-03-19 – 2016-03-21 (×3): 1 via ORAL

## 2016-03-19 NOTE — ED Notes (Signed)
Pt aware urine needed. Unable to provide at this time. Will check back.

## 2016-03-19 NOTE — ED Provider Notes (Signed)
[facility] DEPT Provider Note   CSN: [id number] Arrival date & time: 03/19/16  4742     History   Chief Complaint Chief Complaint  Patient presents with  . Emesis    HPI Tyler DEEM is a 70 y.o. male.  Pt presents to the ED today with hematemesis.  Pt has metastatic small bowel cancer and is followed by Dr. Burr Medico.    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  -Unfortunately he has stage IV disease with diffuse liver metastasis, this is an incurable disease, with overall very poor prognosis. -I reviewed his staging CT scan results from 11/26/2015 with patient and his wife, Unfortunately scan showed disease progression in the liver, his tumor marker CA has been trending up lately, which is consistent with disease progression. -I previously discussed his guardant 360 number testing results, which showed mutations in KRAS, RAF 1, APC, TP 53,  Targeted therapy is nt available at this point. -I discussed and reviewed his restaging CT images from 01/28/2016 which showed disease progression in liver and lungs. I recommend him to stop gemcitabine -His tumor marker CEA has been trending up, corresponding to his disease progression -I have checked the clinical trial options at [facility] and [facility], unfortunately there is no suitable trials for him now.  - he is now on oral Xeloda, tolerating well, we'll continue   2. 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  Hypertension and DM  -He will continue follow-up with his primary care physician. -continue close monitoring   4. Peripheral neuropathy, secondary to chemotherapy, G1 -Overall improved. Stable lately. -Continue Neurontin  200 mg 3 times a day -We'll continue observation.   Plan: -Lab reviewed, adequate for treatment. He will start cycle 2 Xeloda on 03/04/2016 -I will see him back in 3 weeks with lab   Pt finished his Xeloda on 10/17.  He has had severe diarrhea since being on the Xeloda.  Pt said that he woke up this morning, took a sip of water, and vomited pinkish fluid.  The pt feels weak which he has been feeling since chemo.  He has pain in his fingers that has been ongoing since chemo.  The c/o pain to his abdomen.      Past Medical History:  Diagnosis Date  . Diabetes mellitus without complication ([place])   . Hypertension   . Small bowel cancer ([place]) 08/18/2014    Patient Active Problem List   Diagnosis Date Noted  . Benign essential HTN 03/19/2016  . Hyponatremia 03/19/2016  . Dehydration 03/19/2016  . Anemia, chronic disease 03/19/2016  . Colitis, acute 03/19/2016  . Colitis 03/19/2016  . Port catheter in place 12/05/2015  . 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 Peripheral neuropathy due to chemotherapy ([employer]) 06/12/2015  . 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 Diabetes mellitus type 2, uncontrolled ([place]) 04/23/2015  .  Hypercholesterolemia without hypertriglyceridemia 04/23/2015  . Drug-induced skin rash 04/09/2015  . Hyperglycemia 09/09/2014  . SIRS (systemic inflammatory response syndrome) ([facility]) 09/08/2014  . Cough   . Protein-calorie malnutrition, severe ([facility]) 08/19/2014  . Diabetes mellitus without complication ([facility])   . Small bowel cancer ([facility]) 08/18/2014  . GIB (gastrointestinal  bleeding) 08/17/2014  . Anemia 08/17/2014  . AKI (acute kidney injury) ([facility]) 08/17/2014  . Other fatigue 08/17/2014    Past Surgical History:  Procedure Laterality Date  . ENTEROSCOPY N/A 08/18/2014   Procedure: ENTEROSCOPY;  Surgeon: Jeani Hawking, MD;  Location: [facility] ENDOSCOPY;  Service: Endoscopy;  Laterality: N/A;       Home Medications    Prior to Admission medications   Medication Sig Start Date End Date Taking? Authorizing Provider  atorvastatin (LIPITOR) 20 MG tablet Take 20 mg by mouth every evening.    Yes Historical Provider, MD  capecitabine (XELODA) 500 MG tablet Take 4 tablets (2,000 mg total) by mouth 2 (two) times daily after a meal. 02/08/16  Yes Malachy Mood, MD  gabapentin (NEURONTIN) 100 MG capsule Take 2 capsules (200 mg total) by mouth 3 (three) times daily. 01/30/16  Yes Malachy Mood, MD  LANTUS SOLOSTAR 100 UNIT/ML Solostar Pen Inject 30 Units into the skin every evening.  01/29/15  Yes Historical Provider, MD  lidocaine-prilocaine (EMLA) cream Apply topically as needed. Apply to portacath 1 1/2 hours - 2 hours prior to procedures as needed. Patient taking differently: Apply 1 application topically daily as needed (port access). Apply to portacath 1 1/2 hours - 2 hours prior to procedures 10/23/15  Yes Malachy Mood, MD  lisinopril (PRINIVIL,ZESTRIL) 10 MG tablet Take 10 mg by mouth every evening.  09/18/15  Yes Historical Provider, MD  loperamide (IMODIUM) 2 MG capsule Take 4 mg by mouth 4 (four) times daily as needed for diarrhea or loose stools.   Yes Historical Provider, MD  naproxen sodium (ANAPROX) 220 MG tablet Take 440 mg by mouth 2 (two) times daily as needed (pain).   Yes Historical Provider, MD  tamsulosin (FLOMAX) 0.4 MG CAPS capsule Take 0.4 mg by mouth every evening.  06/19/15  Yes Historical Provider, MD  clindamycin (CLINDAGEL) 1 % gel Apply topically 2 (two) times daily. Patient not taking: Reported on 03/19/2016 03/09/15   Malachy Mood, MD  hydrocortisone 2.5 % cream Apply  topically 2 (two) times daily. Patient not taking: Reported on 03/19/2016 03/09/15   Malachy Mood, MD  phenazopyridine (PYRIDIUM) 200 MG tablet Take 1 tablet (200 mg total) by mouth 3 (three) times daily. Patient not taking: Reported on 03/19/2016 01/27/16   Rolland Porter, MD    Family History Family History  Problem Relation Age of Onset  . Heart failure Mother   . Cancer Father   . Cancer Brother     Social History Social History  Substance Use Topics  . Smoking status: Never Smoker  . Smokeless tobacco: Never Used  . Alcohol use No     Allergies   Penicillins   Review of Systems Review of Systems  Constitutional: Positive for fatigue.  Gastrointestinal: Positive for abdominal pain, diarrhea and vomiting.       Hematemesis  All other systems reviewed and are negative.    Physical Exam Updated Vital Signs BP (!) 125/50 (BP Location: Left Arm)   Pulse 70   Temp 97.8 F (36.6 C) (Oral)   Resp 14   SpO2 100%   Physical Exam  Constitutional: He is oriented to person, place, and  time. He appears well-developed and well-nourished.  HENT:  Head: Normocephalic and atraumatic.  Right Ear: External ear normal.  Left Ear: External ear normal.  Nose: Nose normal.  Mouth/Throat: Mucous membranes are dry.  Black spots on tongue  Eyes: Conjunctivae and EOM are normal. Pupils are equal, round, and reactive to light.  Neck: Normal range of motion. Neck supple.  Cardiovascular: Normal rate, regular rhythm, normal heart sounds and intact distal pulses.   Pulmonary/Chest: Effort normal and breath sounds normal.  Abdominal: Soft. There is generalized tenderness.  Genitourinary: Rectal exam shows guaiac positive stool.  Musculoskeletal: Normal range of motion.  Neurological: He is alert and oriented to person, place, and time.  Skin: Skin is warm and dry.  Psychiatric: He has a normal mood and affect. His behavior is normal. Judgment and thought content normal.  Nursing note and  vitals reviewed.    ED Treatments / Results  Labs (all labs ordered are listed, but only abnormal results are displayed) Labs Reviewed  COMPREHENSIVE METABOLIC PANEL - Abnormal; Notable for the following:       Result Value   Sodium 133 (*)    Chloride 99 (*)    Glucose, Bld 272 (*)    BUN 24 (*)    Calcium 8.7 (*)    Total Protein 6.2 (*)    Albumin 3.2 (*)    ALT 12 (*)    Total Bilirubin 1.4 (*)    All other components within normal limits  CBC WITH DIFFERENTIAL/PLATELET - Abnormal; Notable for the following:    RBC 3.27 (*)    Hemoglobin 9.0 (*)    HCT 27.5 (*)    RDW 18.4 (*)    All other components within normal limits  URINALYSIS, ROUTINE W REFLEX MICROSCOPIC (NOT AT [facility]) - Abnormal; Notable for the following:    Color, Urine AMBER (*)    Specific Gravity, Urine >1.046 (*)    Glucose, UA 100 (*)    Bilirubin Urine SMALL (*)    Protein, ur 30 (*)    All other components within normal limits  URINE MICROSCOPIC-ADD ON - Abnormal; Notable for the following:    Squamous Epithelial / LPF 0-5 (*)    Bacteria, UA FEW (*)    Casts HYALINE CASTS (*)    All other components within normal limits  POC OCCULT BLOOD, ED - Abnormal; Notable for the following:    Fecal Occult Bld POSITIVE (*)    All other components within normal limits  GASTROINTESTINAL PANEL BY PCR, STOOL (REPLACES STOOL CULTURE)  C DIFFICILE QUICK SCREEN W PCR REFLEX  LIPASE, BLOOD  I-STAT CG4 LACTIC ACID, ED    EKG  EKG Interpretation None       Radiology Ct Abdomen Pelvis W Contrast  Result Date: 03/19/2016 CLINICAL DATA:  Abdominal pain, vomiting and weakness. Undergoing chemotherapy for small bowel cancer. EXAM: CT ABDOMEN AND PELVIS WITH CONTRAST TECHNIQUE: Multidetector CT imaging of the abdomen and pelvis was performed using the standard protocol following bolus administration of intravenous contrast. CONTRAST:  ISOVUE-300 IOPAMIDOL (ISOVUE-300) INJECTION 61% COMPARISON:  CT  11/26/2015 and 01/28/2016 FINDINGS: Lower chest: Several small nodules are again noted at both lung bases, measuring up to 6 mm in the left lower lobe on image 1. These appear similar to the most recent study. There is a calcified right middle lobe granuloma on image 7. No significant pleural or pericardial effusion. Hepatobiliary: The previously demonstrated multifocal hepatic metastatic disease appears lower in density and less  well-defined, most consistent with response to treatment. The individual lesions are difficult to accurately measure. The lesion in the dome of the right lobe measures approximately 2.9 x 2.8 cm on image number 10 compared with 4.2 x 3.2 cm previously. A lesion inferiorly in the right lobe measures 4.1 x 3.5 cm on image 24 (previously 5.5 x 4.4 cm). There are multiple other lesions. No definite new or enlarging lesions are seen. Calcified gallstones are again noted. There is no gallbladder wall thickening, surrounding inflammation or biliary dilatation. Pancreas: Unremarkable. No pancreatic ductal dilatation or surrounding inflammatory changes. Spleen: Normal in size without focal abnormality. Adrenals/Urinary Tract: Both adrenal glands appear normal. The kidneys appear stable with probable small cysts bilaterally. No evidence of urinary tract calculus or hydronephrosis. No significant bladder findings. Stomach/Bowel: The stomach, small bowel and appendix appear unremarkable. The colon is relatively decompressed, although demonstrates diffuse mild wall thickening and surrounding inflammation, especially in the descending and proximal sigmoid colon. No evidence of bowel obstruction or focal extraluminal fluid collection. Vascular/Lymphatic: Small retroperitoneal lymph nodes are stable. There is aortic and branch vessel atherosclerosis, but no evidence of large vessel occlusion. Reproductive: Stable mild enlargement of the prostate gland the seminal vesicles appear normal. Other: Mild  mesenteric edema. There is no ascites. The anterior abdominal wall appears unremarkable. Musculoskeletal: No acute or significant osseous findings. Lower lumbar spondylosis noted. IMPRESSION: 1. New colonic wall thickening and surrounding inflammation most consistent with diffuse colitis. Consider pseudomembranous (Clostridium difficile) colitis. Other considerations include viral infection and drug reaction. 2. No evidence of bowel obstruction, perforation or abscess. 3. Interval improvement in multifocal hepatic metastatic disease. 4. Stable nodularity at both lung bases. 5. Cholelithiasis and atherosclerosis again noted. Electronically Signed   By: Richardean Sale M.D.   On: 03/19/2016 12:10   Dg Abd Acute W/chest  Result Date: 03/19/2016 CLINICAL DATA:  Abdominal pain with nausea, vomiting, and diarrhea. Metastatic small bowel cancer. EXAM: DG ABDOMEN ACUTE W/ 1V CHEST COMPARISON:  Chest x-ray dated 09/07/2014 and CT scan of the chest, abdomen and pelvis dated 01/28/2016 FINDINGS: Power port in place. No infiltrates or effusions. Tiny pulmonary nodules present on the CT scan are not appreciable on this chest x-ray. No free air or free fluid. No dilated loops of large or small bowel. No significant bone abnormality. Small gallstones are noted. IMPRESSION: No acute abnormalities. Benign-appearing abdomen except for cholelithiasis. Electronically Signed   By: Lorriane Shire M.D.   On: 03/19/2016 11:16    Procedures Procedures (including critical care time)  Medications Ordered in ED Medications  sodium chloride 0.9 % bolus 1,000 mL (0 mLs Intravenous Stopped 03/19/16 1221)    And  0.9 %  sodium chloride infusion ( Intravenous New Bag/Given 03/19/16 1221)  famotidine (PEPCID) IVPB 20 mg premix (0 mg Intravenous Stopped 03/19/16 1125)  sodium chloride 0.9 % bolus 1,000 mL (not administered)  ciprofloxacin (CIPRO) IVPB 400 mg (not administered)  metroNIDAZOLE (FLAGYL) IVPB 500 mg (not administered)    ondansetron (ZOFRAN) injection 4 mg (4 mg Intravenous Given 03/19/16 1016)  morphine 4 MG/ML injection 4 mg (4 mg Intravenous Given 03/19/16 1016)  iopamidol (ISOVUE-300) 61 % injection 100 mL (100 mLs Intravenous Contrast Given 03/19/16 1128)     Initial Impression / Assessment and Plan / ED Course  I have reviewed the triage vital signs and the nursing notes.  Pertinent labs & imaging results that were available during my care of the patient were reviewed by me and considered in my medical  decision making (see chart for details).  Clinical Course   Due to the diffuse colitis and the pt on Xeloda as well as the worsening anemia with guaiac + stools, I think pt needs to come into the hospital.  Pt d/w Dr. Wynelle Cleveland (triad) who will admit him.  Final Clinical Impressions(s) / ED Diagnoses   Final diagnoses:  Small bowel cancer ([place])  Dehydration  Gastrointestinal hemorrhage, unspecified gastrointestinal hemorrhage type  Anemia, unspecified type  Hyperglycemia  Nausea vomiting and diarrhea    New Prescriptions New Prescriptions   No medications on file     Isla Pence, MD 03/19/16 1305

## 2016-03-19 NOTE — H&P (Signed)
History and Physical    Tyler Evans [id number] DOB: 21-Dec-1945 DOA: 03/19/2016    PCP: Kandice Hams, MD  Patient coming from: home  Chief Complaint: vomiting with blood  HPI: Tyler Evans is a 70 y.o. 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 He has been having profuse diarrhea for the past 3 days with 4-5 episodes during the day and a few episodes through the night. Stool is nonbloody. He has mid and lower abdominal pain which was about a 7 out of 10 and has improved after receiving morphine in the ER. Pain does not radiate.. No fevers or chills. No sick contacts. Today he vomited once- vomitus was blood-tinged and therefore presented to the ER. Currently not feeling nauseated and feels like he can take clear liquids. Noted to be dehydrated on lab work and on exam. No complaint of feeling lightheaded or decrease in urine output.  ED Course: sodium 133, Cl 99, BUN 24, Cr 1.13 (ratio> 20) , T bili 1.4, Hb 9.0, U sp gravity > 1.046,  CT abd/pelvis- diffuse colitis, interval improvement in multifocal hepatic metastasis, stable nodularity in lung bases, cholelithiasis FOB + Orthostatic vitals +  Review of Systems:  Sore throat x 2 days Numbness and tingling in fingers and feet- has trouble standing for long periods All other systems reviewed and apart from HPI, are negative.  Past Medical History:  Diagnosis Date  . Diabetes mellitus without complication ([place])   . Hypertension   . Small bowel cancer ([place]) 08/18/2014    Past Surgical History:  Procedure Laterality Date  . ENTEROSCOPY N/A 08/18/2014   Procedure: ENTEROSCOPY;  Surgeon: Carol Ada, MD;  Location: [facility];  Service: Endoscopy;  Laterality: N/A;    Social History:   reports that he has never smoked. He has never used smokeless tobacco. He reports that he  does not drink alcohol. His drug history is not on file.  Lives with his wife- able to perform ALDS, does not use cane or walker  Allergies  Allergen Reactions  . Penicillins Nausea And Vomiting    Has patient had a PCN reaction causing immediate rash, facial/tongue/throat swelling, SOB or lightheadedness with hypotension: no Has patient had a PCN reaction causing severe rash involving mucus membranes or skin necrosis: unknown Has patient had a PCN reaction that required hospitalization : in Dr's office Has patient had a PCN reaction occurring within the last 10 years: no If all of the above answers are "NO", then may proceed with Cephalosporin use.     Family History  Problem Relation Age of Onset  . Heart failure Mother   . Cancer Father   . Cancer Brother      Prior to Admission medications   Medication Sig Start Date End Date Taking? Authorizing Provider  atorvastatin (LIPITOR) 20 MG tablet Take 20 mg by mouth every evening.    Yes Historical Provider, MD  capecitabine (XELODA) 500 MG tablet Take 4 tablets (2,000 mg total) by mouth 2 (two) times daily after a meal. 02/08/16  Yes Truitt Merle, MD  gabapentin (NEURONTIN) 100 MG capsule Take 2 capsules (200 mg total) by mouth 3 (three) times daily. 01/30/16  Yes Truitt Merle, MD  LANTUS SOLOSTAR 100 UNIT/ML Solostar Pen Inject 30 Units into the skin every evening.  01/29/15  Yes Historical Provider, MD  lidocaine-prilocaine (EMLA)  cream Apply topically as needed. Apply to portacath 1 1/2 hours - 2 hours prior to procedures as needed. Patient taking differently: Apply 1 application topically daily as needed (port access). Apply to portacath 1 1/2 hours - 2 hours prior to procedures 10/23/15  Yes Truitt Merle, MD  lisinopril (PRINIVIL,ZESTRIL) 10 MG tablet Take 10 mg by mouth every evening.  09/18/15  Yes Historical Provider, MD  loperamide (IMODIUM) 2 MG capsule Take 4 mg by mouth 4 (four) times daily as needed for diarrhea or loose stools.   Yes  Historical Provider, MD  naproxen sodium (ANAPROX) 220 MG tablet Take 440 mg by mouth 2 (two) times daily as needed (pain).   Yes Historical Provider, MD  tamsulosin (FLOMAX) 0.4 MG CAPS capsule Take 0.4 mg by mouth every evening.  06/19/15  Yes Historical Provider, MD  clindamycin (CLINDAGEL) 1 % gel Apply topically 2 (two) times daily. Patient not taking: Reported on 03/19/2016 03/09/15   Truitt Merle, MD  hydrocortisone 2.5 % cream Apply topically 2 (two) times daily. Patient not taking: Reported on 03/19/2016 03/09/15   Truitt Merle, MD  phenazopyridine (PYRIDIUM) 200 MG tablet Take 1 tablet (200 mg total) by mouth 3 (three) times daily. Patient not taking: Reported on 03/19/2016 01/27/16   Tanna Furry, MD    Physical Exam: Vitals:   03/19/16 0902 03/19/16 0920 03/19/16 1049 03/19/16 1156  BP: 134/79 133/69 (!) 106/44 (!) 125/50  Pulse: 110 97 71 70  Resp: 18 18 15 14   Temp: 97.8 F (36.6 C)     TempSrc: Oral     SpO2: 100% 100% 100% 100%      Constitutional: NAD, calm, comfortable Eyes: PERTLA, lids and conjunctivae normal ENMT: Mucous membranes are Dry Posterior pharynx clear of any exudate or lesions. Normal dentition. Tongue has blackish discoloration Neck: normal, supple, no masses, no thyromegaly Respiratory: clear to auscultation bilaterally, no wheezing, no crackles. Normal respiratory effort. No accessory muscle use.  Cardiovascular: S1 & S2 heard, regular rate and rhythm, no murmurs / rubs / gallops. No extremity edema. 2+ pedal pulses. No carotid bruits.  Abdomen: No distension, no tenderness, no masses palpated. No hepatosplenomegaly. Bowel sounds normal.  Musculoskeletal: no clubbing / cyanosis. No joint deformity upper and lower extremities. Good ROM, no contractures. Normal muscle tone.  Skin: no rashes, lesions, ulcers. No induration Neurologic: CN 2-12 grossly intact. Sensation intact, DTR normal. Strength 5/5 in all 4 limbs.  Psychiatric: Normal judgment and insight. Alert  and oriented x 3. Normal mood.     Labs on Admission: I have personally reviewed following labs and imaging studies  CBC:  Recent Labs Lab 03/19/16 1004  WBC 5.3  NEUTROABS 3.8  HGB 9.0*  HCT 27.5*  MCV 84.1  PLT 123456   Basic Metabolic Panel:  Recent Labs Lab 03/19/16 1004  NA 133*  K 3.7  CL 99*  CO2 24  GLUCOSE 272*  BUN 24*  CREATININE 1.13  CALCIUM 8.7*   GFR: CrCl cannot be calculated (Unknown ideal weight.). Liver Function Tests:  Recent Labs Lab 03/19/16 1004  AST 20  ALT 12*  ALKPHOS 59  BILITOT 1.4*  PROT 6.2*  ALBUMIN 3.2*    Recent Labs Lab 03/19/16 1004  LIPASE 22   No results for input(s): AMMONIA in the last 168 hours. Coagulation Profile: No results for input(s): INR, PROTIME in the last 168 hours. Cardiac Enzymes: No results for input(s): CKTOTAL, CKMB, CKMBINDEX, TROPONINI in the last 168 hours. BNP (last 3 results)  No results for input(s): PROBNP in the last 8760 hours. HbA1C: No results for input(s): HGBA1C in the last 72 hours. CBG: No results for input(s): GLUCAP in the last 168 hours. Lipid Profile: No results for input(s): CHOL, HDL, LDLCALC, TRIG, CHOLHDL, LDLDIRECT in the last 72 hours. Thyroid Function Tests: No results for input(s): TSH, T4TOTAL, FREET4, T3FREE, THYROIDAB in the last 72 hours. Anemia Panel: No results for input(s): VITAMINB12, FOLATE, FERRITIN, TIBC, IRON, RETICCTPCT in the last 72 hours. Urine analysis:    Component Value Date/Time   COLORURINE AMBER (A) 03/19/2016 1158   APPEARANCEUR CLEAR 03/19/2016 1158   LABSPEC >1.046 (H) 03/19/2016 1158   PHURINE 6.0 03/19/2016 1158   GLUCOSEU 100 (A) 03/19/2016 1158   HGBUR NEGATIVE 03/19/2016 1158   BILIRUBINUR SMALL (A) 03/19/2016 1158   KETONESUR NEGATIVE 03/19/2016 1158   PROTEINUR 30 (A) 03/19/2016 1158   UROBILINOGEN 0.2 09/08/2014 0021   NITRITE NEGATIVE 03/19/2016 1158   LEUKOCYTESUR NEGATIVE 03/19/2016 1158   Sepsis  Labs: @LABRCNTIP (procalcitonin:4,lacticidven:4) )No results found for this or any previous visit (from the past 240 hour(s)).   Radiological Exams on Admission: Ct Abdomen Pelvis W Contrast  Result Date: 03/19/2016 CLINICAL DATA:  Abdominal pain, vomiting and weakness. Undergoing chemotherapy for small bowel cancer. EXAM: CT ABDOMEN AND PELVIS WITH CONTRAST TECHNIQUE: Multidetector CT imaging of the abdomen and pelvis was performed using the standard protocol following bolus administration of intravenous contrast. CONTRAST:  13mL ISOVUE-300 IOPAMIDOL (ISOVUE-300) INJECTION 61% COMPARISON:  CT 11/26/2015 and 01/28/2016 FINDINGS: Lower chest: Several small nodules are again noted at both lung bases, measuring up to 6 mm in the left lower lobe on image 1. These appear similar to the most recent study. There is a calcified right middle lobe granuloma on image 7. No significant pleural or pericardial effusion. Hepatobiliary: The previously demonstrated multifocal hepatic metastatic disease appears lower in density and less well-defined, most consistent with response to treatment. The individual lesions are difficult to accurately measure. The lesion in the dome of the right lobe measures approximately 2.9 x 2.8 cm on image number 10 compared with 4.2 x 3.2 cm previously. A lesion inferiorly in the right lobe measures 4.1 x 3.5 cm on image 24 (previously 5.5 x 4.4 cm). There are multiple other lesions. No definite new or enlarging lesions are seen. Calcified gallstones are again noted. There is no gallbladder wall thickening, surrounding inflammation or biliary dilatation. Pancreas: Unremarkable. No pancreatic ductal dilatation or surrounding inflammatory changes. Spleen: Normal in size without focal abnormality. Adrenals/Urinary Tract: Both adrenal glands appear normal. The kidneys appear stable with probable small cysts bilaterally. No evidence of urinary tract calculus or hydronephrosis. No significant bladder  findings. Stomach/Bowel: The stomach, small bowel and appendix appear unremarkable. The colon is relatively decompressed, although demonstrates diffuse mild wall thickening and surrounding inflammation, especially in the descending and proximal sigmoid colon. No evidence of bowel obstruction or focal extraluminal fluid collection. Vascular/Lymphatic: Small retroperitoneal lymph nodes are stable. There is aortic and branch vessel atherosclerosis, but no evidence of large vessel occlusion. Reproductive: Stable mild enlargement of the prostate gland the seminal vesicles appear normal. Other: Mild mesenteric edema. There is no ascites. The anterior abdominal wall appears unremarkable. Musculoskeletal: No acute or significant osseous findings. Lower lumbar spondylosis noted. IMPRESSION: 1. New colonic wall thickening and surrounding inflammation most consistent with diffuse colitis. Consider pseudomembranous (Clostridium difficile) colitis. Other considerations include viral infection and drug reaction. 2. No evidence of bowel obstruction, perforation or abscess. 3. Interval improvement in  multifocal hepatic metastatic disease. 4. Stable nodularity at both lung bases. 5. Cholelithiasis and atherosclerosis again noted. Electronically Signed   By: Richardean Sale M.D.   On: 03/19/2016 12:10   Dg Abd Acute W/chest  Result Date: 03/19/2016 CLINICAL DATA:  Abdominal pain with nausea, vomiting, and diarrhea. Metastatic small bowel cancer. EXAM: DG ABDOMEN ACUTE W/ 1V CHEST COMPARISON:  Chest x-ray dated 09/07/2014 and CT scan of the chest, abdomen and pelvis dated 01/28/2016 FINDINGS: Power port in place. No infiltrates or effusions. Tiny pulmonary nodules present on the CT scan are not appreciable on this chest x-ray. No free air or free fluid. No dilated loops of large or small bowel. No significant bone abnormality. Small gallstones are noted. IMPRESSION: No acute abnormalities. Benign-appearing abdomen except for  cholelithiasis. Electronically Signed   By: Lorriane Shire M.D.   On: 03/19/2016 11:16     Assessment/Plan Principal Problem:   Colitis, acute with vomiting and diarrhea -Diffuse inflammation noted on CT-has not had diarrhea with Xeloda before - Cipro + Flagyl - stool for C diff and GI pathogen panel - Clear liquids -When necessary hydrocodone and morphine for pain -Zofran for vomiting  Active Problems:  Mild hematemesis - Protonix twice a day for now- hod off on GI consult  unless bleeding becomes worse    Hyponatremia   Dehydration   Orthostatic hypotension - IV Hydration- will receive 1 L bolus of normal saline in the ER -Lactic acid normal -Follow I and O  Mild elevation in bilirubin -Recheck tomorrow  Metastatic adenocarcinoma of small bowel -Primary in jejunum, metastasis in liver with decreasing size of liver metastases - diagnosed in 3/16 via endoscopy - Gemcitabine stopped due to disease progression -9/17 - started on on Xeloda 2 weeks on 2 weeks off- second course completed 2 days ago - being treated by Dr Burr Medico    Diabetes mellitus without complication - last 123456 in 3/16 was 7.7 - cont Lantus at 10 units daily rather than 30 - sensitive ISS    Hypercholesterolemia without hypertriglyceridemia - Lipitor    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

## 2016-03-19 NOTE — ED Notes (Signed)
Patient transported to CT 

## 2016-03-19 NOTE — ED Notes (Signed)
Pt is aware of urinalysis. Will continue to check for urine sample. Was given Urinal when pt arrived.

## 2016-03-19 NOTE — ED Triage Notes (Signed)
Patient presents to ED from home with c/o hematemesis.  Patient states he finished his last chemo pill on Tuesday and had diarrhea Sat/Sun.  Patient states that is typical of his side effects from chemo.  Patient states this morning he took a sip of cold water and immediately "vomited pinkish fluid."  Patient endorses weakness, which he has at baseline with chemo, but denies dizziness and SOB.  Patient denies pain except for ongoing neuropathy in his fingers r/t chemo tx.  Patient denies fever.

## 2016-03-20 DIAGNOSIS — C179 Malignant neoplasm of small intestine, unspecified: Secondary | ICD-10-CM | POA: Diagnosis not present

## 2016-03-20 DIAGNOSIS — I10 Essential (primary) hypertension: Secondary | ICD-10-CM

## 2016-03-20 DIAGNOSIS — D630 Anemia in neoplastic disease: Secondary | ICD-10-CM | POA: Diagnosis not present

## 2016-03-20 DIAGNOSIS — K529 Noninfective gastroenteritis and colitis, unspecified: Secondary | ICD-10-CM | POA: Diagnosis not present

## 2016-03-20 DIAGNOSIS — E119 Type 2 diabetes mellitus without complications: Secondary | ICD-10-CM

## 2016-03-20 DIAGNOSIS — C787 Secondary malignant neoplasm of liver and intrahepatic bile duct: Secondary | ICD-10-CM | POA: Diagnosis not present

## 2016-03-20 DIAGNOSIS — K922 Gastrointestinal hemorrhage, unspecified: Secondary | ICD-10-CM

## 2016-03-20 DIAGNOSIS — L271 Localized skin eruption due to drugs and medicaments taken internally: Secondary | ICD-10-CM

## 2016-03-20 LAB — COMPREHENSIVE METABOLIC PANEL
ALK PHOS: 48 U/L (ref 38–126)
ALT: 10 U/L — AB (ref 17–63)
ANION GAP: 4 — AB (ref 5–15)
AST: 15 U/L (ref 15–41)
Albumin: 2.6 g/dL — ABNORMAL LOW (ref 3.5–5.0)
BILIRUBIN TOTAL: 0.8 mg/dL (ref 0.3–1.2)
BUN: 17 mg/dL (ref 6–20)
CALCIUM: 8 mg/dL — AB (ref 8.9–10.3)
CO2: 24 mmol/L (ref 22–32)
CREATININE: 1.05 mg/dL (ref 0.61–1.24)
Chloride: 107 mmol/L (ref 101–111)
GFR calc non Af Amer: >60 mL/min (ref >60)
GLUCOSE: 223 mg/dL — AB (ref 65–99)
Potassium: 3.7 mmol/L (ref 3.5–5.1)
Sodium: 135 mmol/L (ref 135–145)
TOTAL PROTEIN: 5 g/dL — AB (ref 6.5–8.1)

## 2016-03-20 LAB — GASTROINTESTINAL PANEL BY PCR, STOOL (REPLACES STOOL CULTURE)
ADENOVIRUS F40/41: NOT DETECTED
ASTROVIRUS: NOT DETECTED
CAMPYLOBACTER SPECIES: NOT DETECTED
CYCLOSPORA CAYETANENSIS: NOT DETECTED
Cryptosporidium: NOT DETECTED
ENTEROPATHOGENIC E COLI (EPEC): NOT DETECTED
ENTEROTOXIGENIC E COLI (ETEC): NOT DETECTED
Entamoeba histolytica: NOT DETECTED
Enteroaggregative E coli (EAEC): NOT DETECTED
Giardia lamblia: NOT DETECTED
NOROVIRUS GI/GII: NOT DETECTED
PLESIMONAS SHIGELLOIDES: NOT DETECTED
ROTAVIRUS A: NOT DETECTED
SAPOVIRUS (I, II, IV, AND V): NOT DETECTED
SHIGA LIKE TOXIN PRODUCING E COLI (STEC): NOT DETECTED
Salmonella species: NOT DETECTED
Shigella/Enteroinvasive E coli (EIEC): NOT DETECTED
VIBRIO SPECIES: NOT DETECTED
Vibrio cholerae: NOT DETECTED
Yersinia enterocolitica: NOT DETECTED

## 2016-03-20 LAB — CBC
HCT: 22.3 % — ABNORMAL LOW (ref 39.0–52.0)
HEMOGLOBIN: 7.3 g/dL — AB (ref 13.0–17.0)
MCH: 28 pg (ref 26.0–34.0)
MCHC: 32.7 g/dL (ref 30.0–36.0)
MCV: 85.4 fL (ref 78.0–100.0)
PLATELETS: 134 10*3/uL — AB (ref 150–400)
RBC: 2.61 MIL/uL — ABNORMAL LOW (ref 4.22–5.81)
RDW: 18.5 % — ABNORMAL HIGH (ref 11.5–15.5)
WBC: 3.9 10*3/uL — ABNORMAL LOW (ref 4.0–10.5)

## 2016-03-20 LAB — GLUCOSE, CAPILLARY
GLUCOSE-CAPILLARY: 185 mg/dL — AB (ref 65–99)
GLUCOSE-CAPILLARY: 170 mg/dL — AB (ref 65–99)
Glucose-Capillary: 187 mg/dL — ABNORMAL HIGH (ref 65–99)
Glucose-Capillary: 176 mg/dL — ABNORMAL HIGH (ref 65–99)

## 2016-03-20 LAB — IRON AND TIBC
IRON: 38 ug/dL — AB (ref 45–182)
Saturation Ratios: 19 % (ref 17.9–39.5)
TIBC: 202 ug/dL — ABNORMAL LOW (ref 250–450)
UIBC: 164 ug/dL

## 2016-03-20 LAB — RETICULOCYTES
RBC.: 2.52 MIL/uL — ABNORMAL LOW (ref 4.22–5.81)
RETIC COUNT ABSOLUTE: 10.1 10*3/uL — AB (ref 19.0–186.0)
Retic Ct Pct: 0.4 % (ref 0.4–3.1)

## 2016-03-20 LAB — PREPARE RBC (CROSSMATCH)

## 2016-03-20 LAB — FERRITIN: FERRITIN: 375 ng/mL — AB (ref 24–336)

## 2016-03-20 MED ORDER — SODIUM CHLORIDE 0.9 % IV SOLN
Freq: Once | INTRAVENOUS | Status: AC
  Administered 2016-03-20: 16:00:00 via INTRAVENOUS

## 2016-03-20 MED ORDER — UREA 10 % EX LOTN
TOPICAL_LOTION | Freq: Two times a day (BID) | CUTANEOUS | Status: DC
  Filled 2016-03-20: qty 177

## 2016-03-20 MED ORDER — THERA-DERM EX LOTN
TOPICAL_LOTION | Freq: Two times a day (BID) | CUTANEOUS | Status: DC
  Administered 2016-03-20 – 2016-03-21 (×2): via TOPICAL
  Filled 2016-03-20: qty 236

## 2016-03-20 MED ORDER — UNJURY CHICKEN SOUP POWDER
8.0000 [oz_av] | Freq: Two times a day (BID) | ORAL | Status: DC
  Administered 2016-03-20: 8 [oz_av] via ORAL
  Filled 2016-03-20 (×4): qty 27

## 2016-03-20 MED ORDER — INSULIN GLARGINE 100 UNIT/ML ~~LOC~~ SOLN
15.0000 [IU] | Freq: Every day | SUBCUTANEOUS | Status: DC
  Administered 2016-03-20: 15 [IU] via SUBCUTANEOUS
  Filled 2016-03-20 (×2): qty 0.15

## 2016-03-20 NOTE — Progress Notes (Signed)
PROGRESS NOTE  Tyler Evans  [id number] DOB: 1946/04/22 DOA: 03/19/2016 PCP: Kandice Hams, MD  Outpatient Specialists: Dr. Truitt Merle, oncology  Brief Narrative: Tyler Evans is a 70 y.o. 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 He has been having profuse non-bloody diarrhea for the past 3 days, associated with mid and lower abdominal pain and a single episode of blood-tinged emesis. He appeared dehydrated but in no distress upon arrival. Orthostatic vital signs were positive and + FOBT. Lab work showed sodium 133, Cl 99, BUN 24, Cr 1.13 (ratio> 20) , T bili 1.4, Hb 9.0, U sp gravity > 1.046. Diffuse colitis was noted on CT abdomen and pelvis, in addition to interval improvement in multifocal hepatic metastasis, stable nodularity in lung bases, and cholelithiasis. He was started on IV antibiotics, IV fluids, and brought in to the hospital for observation.   Assessment & Plan: Principal Problem:   Colitis, acute Active Problems:   Protein-calorie malnutrition, severe (HCC)   Diabetes mellitus without complication ([place])   Hypercholesterolemia without hypertriglyceridemia   Peripheral neuropathy due to chemotherapy [facility])   Port catheter in place   Benign essential HTN   Hyponatremia   Dehydration   Anemia, chronic disease   Colitis  Acute colitis: Drug reaction to xeloda vs. infectious etiology, though GI pathogen panel and C Diff assay were negative.  - Cipro + Flagyl started empirically, will D/C with negative pathogen testing. Monitor. - Advance diet as tolerated - Hydrocodone prn pain - Zofran prn nausea/emesis  Mild hematemesis: Acute, resolved. ?MW tear - PPI BID. If bleeding recurs, will consult GI.  Anemia: Acute on chronic problem. In setting of chemotherapy, hematemesis, hypotension.  - Transfuse 2u PRBCs and monitor H&H. - Retic, iron studies  ordered prior to transfusion   Hyponatremia Dehydration Orthostatic hypotension: No signs of sepsis. Lactate normal. - s/p 1L NS bolus in ED followed by 125cc/hr - Continue IVF and po intake.  - Monitor I/O - Repeat orthostatics in AM  Mild elevation in bilirubin: Suspect due to dehydration -Recheck tomorrow  Metastatic adenocarcinoma of small bowel: Dx March 2016. Followed by Dr. Burr Medico, currently on 4th line treatment due to refractory course. - Primary in jejunum, metastasis in liver with decreasing size of liver metastases evident on CT this admission. - Started on xeloda 9/17: 2 weeks on / 1 week off. Completed 2 courses PTA. - Dr. Burr Medico has visited pt in hospital.  Diabetes mellitus without complication: last [id number] in 3/16 was 7.7. CBGs just above inpatient goal. - Lantus restartedat 10 units daily rather than 30 given poor po: Will increase to 15u tonight.   - sensitive ISS  Hypercholesterolemia: - Continue lipitor  Peripheral neuropathy: Complicated of chemotherapy - Continue neurontin. Consider increasing dose (currently 200mg  TID)  Benign essential HTN: Chronic, stable. Borderline hypotensive currently. - hold lisinopril for now given dehydration/relative hypotension.  DVT prophylaxis: Lovenox Code Status: Full code Family Communication: None at bedside. Disposition Plan: Continued observation, D/C in AM if able to take po.   Consultants:   Oncology, Dr. Burr Medico  Procedures:   None  Antimicrobials:  Cipro/flagyl (10/18 >> 10/19)   Subjective: Pt feels better. Had 3 episodes of diarrhea overnight. No emesis. Minimal nausea. Ate 1/2 of breakfast clear liquids. Light-headed at times.   Objective: Vitals:   03/19/16 2106 03/20/16 0530 03/20/16 0945 03/20/16 1400  BP: 127/60 118/61 (!) 141/57 Marland Kitchen)  115/54  Pulse: 84 76 96 80  Resp: 16 16 17 18   Temp: 98.2 F (36.8 C) 97.7 F (36.5 C) 98.3 F (36.8 C) 98 F (36.7 C)  TempSrc: Oral Oral Oral Oral  SpO2:  100% 100% 93% 100%  Weight:      Height:        Intake/Output Summary (Last 24 hours) at 03/20/16 1428 Last data filed at 03/20/16 1410  Gross per 24 hour  Intake          3472.42 ml  Output                0 ml  Net          3472.42 ml   Filed Weights   03/19/16 1456  Weight: 79.8 kg (176 lb)    Examination: General exam: 70 y.o. male in no distress HEENT: Anicteric sclerae, conjunctival pallor Respiratory system: Non-labored breathing. Clear to auscultation bilaterally.  Cardiovascular system: Regular rate and rhythm. No murmur, rub, or gallop. No JVD, and no pedal edema. Gastrointestinal system: Abdomen soft, non-tender, non-distended, with normoactive bowel sounds. No organomegaly or masses felt. Central nervous system: Alert and oriented. No focal neurological deficits. Extremities: Warm, no deformities Skin: No rashes, lesions, diffuse pallor Psychiatry: Judgement and insight appear normal. Mood & affect appropriate.   Data Reviewed: I have personally reviewed following labs and imaging studies  CBC:  Recent Labs Lab 03/19/16 1004 03/20/16 0530  WBC 5.3 3.9*  NEUTROABS 3.8  --   HGB 9.0* 7.3*  HCT 27.5* 22.3*  MCV 84.1 85.4  PLT 196 [id number]*   Basic Metabolic Panel:  Recent Labs Lab 03/19/16 1004 03/20/16 0530  NA 133* 135  K 3.7 3.7  CL 99* 107  CO2 24 24  GLUCOSE 272* 223*  BUN 24* 17  CREATININE 1.13 1.05  CALCIUM 8.7* 8.0*   GFR: Estimated Creatinine Clearance: 63.1 mL/min (by C-G formula based on SCr of 1.05 mg/dL). Liver Function Tests:  Recent Labs Lab 03/19/16 1004 03/20/16 0530  AST 20 15  ALT 12* 10*  ALKPHOS 59 48  BILITOT 1.4* 0.8  PROT 6.2* 5.0*  ALBUMIN 3.2* 2.6*    Recent Labs Lab 03/19/16 1004  LIPASE 22   No results for input(s): AMMONIA in the last 168 hours. Coagulation Profile: No results for input(s): INR, PROTIME in the last 168 hours. Cardiac Enzymes: No results for input(s): CKTOTAL, CKMB, CKMBINDEX, TROPONINI  in the last 168 hours. BNP (last 3 results) No results for input(s): PROBNP in the last 8760 hours. HbA1C: No results for input(s): HGBA1C in the last 72 hours. CBG:  Recent Labs Lab 03/19/16 1709 03/19/16 2111 03/20/16 0747 03/20/16 1148  GLUCAP 207* 181* 185* 187*   Lipid Profile: No results for input(s): CHOL, HDL, LDLCALC, TRIG, CHOLHDL, LDLDIRECT in the last 72 hours. Thyroid Function Tests: No results for input(s): TSH, T4TOTAL, FREET4, T3FREE, THYROIDAB in the last 72 hours. Anemia Panel:  Recent Labs  03/20/16 1300  RETICCTPCT 0.4   Urine analysis:    Component Value Date/Time   COLORURINE AMBER (A) 03/19/2016 1158   APPEARANCEUR CLEAR 03/19/2016 1158   LABSPEC >1.046 (H) 03/19/2016 1158   PHURINE 6.0 03/19/2016 1158   GLUCOSEU 100 (A) 03/19/2016 1158   HGBUR NEGATIVE 03/19/2016 1158   BILIRUBINUR SMALL (A) 03/19/2016 1158   KETONESUR NEGATIVE 03/19/2016 1158   PROTEINUR 30 (A) 03/19/2016 1158   UROBILINOGEN 0.2 09/08/2014 0021   NITRITE NEGATIVE 03/19/2016 1158   LEUKOCYTESUR NEGATIVE 03/19/2016  1158   Sepsis Labs: @LABRCNTIP (procalcitonin:4,lacticidven:4)  ) Recent Results (from the past 240 hour(s))  Gastrointestinal Panel by PCR , Stool     Status: None   Collection Time: 03/19/16  6:20 PM  Result Value Ref Range Status   Campylobacter species NOT DETECTED NOT DETECTED Final   Plesimonas shigelloides NOT DETECTED NOT DETECTED Final   Salmonella species NOT DETECTED NOT DETECTED Final   Yersinia enterocolitica NOT DETECTED NOT DETECTED Final   Vibrio species NOT DETECTED NOT DETECTED Final   Vibrio cholerae NOT DETECTED NOT DETECTED Final   Enteroaggregative E coli (EAEC) NOT DETECTED NOT DETECTED Final   Enteropathogenic E coli (EPEC) NOT DETECTED NOT DETECTED Final   Enterotoxigenic E coli (ETEC) NOT DETECTED NOT DETECTED Final   Shiga like toxin producing E coli (STEC) NOT DETECTED NOT DETECTED Final   Shigella/Enteroinvasive E coli (EIEC) NOT  DETECTED NOT DETECTED Final   Cryptosporidium NOT DETECTED NOT DETECTED Final   Cyclospora cayetanensis NOT DETECTED NOT DETECTED Final   Entamoeba histolytica NOT DETECTED NOT DETECTED Final   Giardia lamblia NOT DETECTED NOT DETECTED Final   Adenovirus F40/41 NOT DETECTED NOT DETECTED Final   Astrovirus NOT DETECTED NOT DETECTED Final   Norovirus GI/GII NOT DETECTED NOT DETECTED Final   Rotavirus A NOT DETECTED NOT DETECTED Final   Sapovirus (I, II, IV, and V) NOT DETECTED NOT DETECTED Final  C difficile quick scan w PCR reflex     Status: None   Collection Time: 03/19/16  6:20 PM  Result Value Ref Range Status   C Diff antigen NEGATIVE NEGATIVE Final   C Diff toxin NEGATIVE NEGATIVE Final   C Diff interpretation No C. difficile detected.  Final     Radiology Studies: Ct Abdomen Pelvis W Contrast  Result Date: 03/19/2016 CLINICAL DATA:  Abdominal pain, vomiting and weakness. Undergoing chemotherapy for small bowel cancer. EXAM: CT ABDOMEN AND PELVIS WITH CONTRAST TECHNIQUE: Multidetector CT imaging of the abdomen and pelvis was performed using the standard protocol following bolus administration of intravenous contrast. CONTRAST:  16mL ISOVUE-300 IOPAMIDOL (ISOVUE-300) INJECTION 61% COMPARISON:  CT 11/26/2015 and 01/28/2016 FINDINGS: Lower chest: Several small nodules are again noted at both lung bases, measuring up to 6 mm in the left lower lobe on image 1. These appear similar to the most recent study. There is a calcified right middle lobe granuloma on image 7. No significant pleural or pericardial effusion. Hepatobiliary: The previously demonstrated multifocal hepatic metastatic disease appears lower in density and less well-defined, most consistent with response to treatment. The individual lesions are difficult to accurately measure. The lesion in the dome of the right lobe measures approximately 2.9 x 2.8 cm on image number 10 compared with 4.2 x 3.2 cm previously. A lesion inferiorly  in the right lobe measures 4.1 x 3.5 cm on image 24 (previously 5.5 x 4.4 cm). There are multiple other lesions. No definite new or enlarging lesions are seen. Calcified gallstones are again noted. There is no gallbladder wall thickening, surrounding inflammation or biliary dilatation. Pancreas: Unremarkable. No pancreatic ductal dilatation or surrounding inflammatory changes. Spleen: Normal in size without focal abnormality. Adrenals/Urinary Tract: Both adrenal glands appear normal. The kidneys appear stable with probable small cysts bilaterally. No evidence of urinary tract calculus or hydronephrosis. No significant bladder findings. Stomach/Bowel: The stomach, small bowel and appendix appear unremarkable. The colon is relatively decompressed, although demonstrates diffuse mild wall thickening and surrounding inflammation, especially in the descending and proximal sigmoid colon. No evidence of bowel  obstruction or focal extraluminal fluid collection. Vascular/Lymphatic: Small retroperitoneal lymph nodes are stable. There is aortic and branch vessel atherosclerosis, but no evidence of large vessel occlusion. Reproductive: Stable mild enlargement of the prostate gland the seminal vesicles appear normal. Other: Mild mesenteric edema. There is no ascites. The anterior abdominal wall appears unremarkable. Musculoskeletal: No acute or significant osseous findings. Lower lumbar spondylosis noted. IMPRESSION: 1. New colonic wall thickening and surrounding inflammation most consistent with diffuse colitis. Consider pseudomembranous (Clostridium difficile) colitis. Other considerations include viral infection and drug reaction. 2. No evidence of bowel obstruction, perforation or abscess. 3. Interval improvement in multifocal hepatic metastatic disease. 4. Stable nodularity at both lung bases. 5. Cholelithiasis and atherosclerosis again noted. Electronically Signed   By: Richardean Sale M.D.   On: 03/19/2016 12:10   Dg Abd  Acute W/chest  Result Date: 03/19/2016 CLINICAL DATA:  Abdominal pain with nausea, vomiting, and diarrhea. Metastatic small bowel cancer. EXAM: DG ABDOMEN ACUTE W/ 1V CHEST COMPARISON:  Chest x-ray dated 09/07/2014 and CT scan of the chest, abdomen and pelvis dated 01/28/2016 FINDINGS: Power port in place. No infiltrates or effusions. Tiny pulmonary nodules present on the CT scan are not appreciable on this chest x-ray. No free air or free fluid. No dilated loops of large or small bowel. No significant bone abnormality. Small gallstones are noted. IMPRESSION: No acute abnormalities. Benign-appearing abdomen except for cholelithiasis. Electronically Signed   By: Lorriane Shire M.D.   On: 03/19/2016 11:16    Scheduled Meds: . sodium chloride   Intravenous Once  . atorvastatin  20 mg Oral QPM  . ciprofloxacin  400 mg Intravenous Q12H  . enoxaparin (LOVENOX) injection  40 mg Subcutaneous Q24H  . famotidine (PEPCID) IV  20 mg Intravenous Q12H  . feeding supplement  1 Container Oral TID BM  . gabapentin  200 mg Oral TID  . insulin aspart  0-5 Units Subcutaneous QHS  . insulin aspart  0-9 Units Subcutaneous TID WC  . insulin glargine  10 Units Subcutaneous QHS  . metronidazole  500 mg Intravenous Q8H  . pantoprazole  40 mg Oral BID  . protein supplement  8 oz Oral q12n4p  . tamsulosin  0.4 mg Oral QPM   Continuous Infusions: . sodium chloride 1,000 mL (03/20/16 0757)     LOS: 0 days   Time spent: 25 minutes.  Vance Gather, MD [facility] Pager [phone]  If 7PM-7AM, please contact night-coverage [email or web address] Password [email or web address] 03/20/2016, 2:28 PM

## 2016-03-20 NOTE — Progress Notes (Signed)
IT assist notified re:my previous note, said he will check and call this Probation officer.

## 2016-03-20 NOTE — Progress Notes (Signed)
Unable to scan 2nd  Unit of blood r/t being locked out in the Epic. Second nurse- Regina B.checked blood with a unit number of [id number] started at 1745 with   A rate of 120 ml/hr. Vital signs checked after 15 mins and it was documented, no reactions noted. Blood transfusion rate increased to 150cc/hr, infusing without difficulty. Will endorsed to night nurse.

## 2016-03-20 NOTE — Progress Notes (Signed)
Infection control personnel [place] notified this nurse that C-diff stool result was negative. Enteric precaution d/c'd.

## 2016-03-20 NOTE — Plan of Care (Signed)
Problem: Safety: Goal: Ability to remain free from injury will improve Outcome: Progressing Patient encouraged to call for assist when getting out of bed r/t orthostatic hypotension. Patient understood reminder re: safety measure. Call bell within reach.

## 2016-03-20 NOTE — Progress Notes (Signed)
Initial Nutrition Assessment  DOCUMENTATION CODES:   Severe malnutrition in context of acute illness/injury  INTERVENTION:   -Continue Boost Breeze po TID, each supplement provides 250 kcal and 9 grams of protein -Provide Unjury Chicken Soup BID, each provides 100 kcal and 21g protein -Encourage PO intake -RD to continue to monitor  NUTRITION DIAGNOSIS:   Increased nutrient needs related to cancer and cancer related treatments as evidenced by estimated needs.  GOAL:   Patient will meet greater than or equal to 90% of their needs  MONITOR:   PO intake, Supplement acceptance, Labs, Weight trends, I & O's  REASON FOR ASSESSMENT:   Malnutrition Screening Tool    ASSESSMENT:   70 y.o. 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 He has been having profuse diarrhea for the past 3 days with 4-5 episodes during the day and a few episodes through the night. Stool is nonbloody. He has mid and lower abdominal pain which was about a 7 out of 10 and has improved after receiving morphine in the ER.  Pt in room with family at bedside. Pt reports prior to Saturday 10/14, he was eating 3 meals a day with good appetite. Starting 10/14 he was having diarrhea multiple times a day. He denies any chewing issues but states that he has difficulty swallowing cold liquids, prefers warm or room temperature fluids. Pt also with sore throat. Pt reports only one instance of vomiting. Pt is willing to try Unjury chicken soup supplements as he has been declining the Boost Breeze supplements.  Per chart review, pt has lost 14 lb since 8/9 (7% wt loss x 2 months, significant for time frame). Pt with mild fat and severe muscle depletion.   Medications reviewed. Labs reviewed: CBGs: 181-185  Diet Order:  Diet clear liquid Room service appropriate? Yes; Fluid  consistency: Thin  Skin:  Reviewed, no issues  Last BM:  10/19  Height:   Ht Readings from Last 1 Encounters:  03/19/16 5\' 6"  (1.676 m)    Weight:   Wt Readings from Last 1 Encounters:  03/19/16 176 lb (79.8 kg)    Ideal Body Weight:  64.5 kg  BMI:  Body mass index is 28.41 kg/m.  Estimated Nutritional Needs:   Kcal:  2250-2450  Protein:  115-125g  Fluid:  2.2-2.4L/day  EDUCATION NEEDS:   No education needs identified at this time  [name] Bibles, MS, RD, LDN Pager: [phone] After Hours Pager: [phone]

## 2016-03-20 NOTE — Care Management Obs Status (Signed)
[facility] NOTIFICATION   Patient Details  Name: Tyler Evans MRN: [id number] Date of Birth: 04-08-1946   Medicare Observation Status Notification Given:  Yes    Lynnell Catalan, RN 03/20/2016, 1:49 PM

## 2016-03-20 NOTE — Progress Notes (Signed)
CHAYIM VANWYHE   DOB:[birth year]   [id number]   [id number]  Oncology follow up note  Subjective: Mr. 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 He was on his second cycle of Xeloda, around day 10, he developed diarrhea, mild intermittent hematochezia and one episode of vomiting of blood.He was admitted yesterday, no more GI bleeding since admission, diarrhea has improved some. ID workup for diarrhea has been negative.   Objective:  Vitals:   03/20/16 1800 03/20/16 2015  BP: 115/67 120/65  Pulse:  79  Resp:  16  Temp: 97.9 F (36.6 C) 98 F (36.7 C)    Body mass index is 28.41 kg/m.  Intake/Output Summary (Last 24 hours) at 03/20/16 2110 Last data filed at 03/20/16 2015  Gross per 24 hour  Intake          4489.92 ml  Output                0 ml  Net          4489.92 ml     Sclerae unicteric  Oropharynx clear  No peripheral adenopathy  Lungs clear -- no rales or rhonchi  Heart regular rate and rhythm  Abdomen benign  MSK no focal spinal tenderness, no peripheral edema  Neuro nonfocal    CBG (last 3)   Recent Labs  03/20/16 0747 03/20/16 1148 03/20/16 1726  GLUCAP 185* 187* 170*     Labs:  CBC Latest Ref Rng & Units 03/20/2016 03/19/2016 02/29/2016  WBC 4.0 - 10.5 K/uL 3.9(L) 5.3 9.8  Hemoglobin 13.0 - 17.0 g/dL 7.3(L) 9.0(L) 10.0(L)  Hematocrit 39.0 - 52.0 % 22.3(L) 27.5(L) 31.1(L)  Platelets 150 - 400 K/uL 134(L) 196 156    CMP Latest Ref Rng & Units 03/20/2016 03/19/2016 02/29/2016  Glucose 65 - 99 mg/dL 223(H) 272(H) 245(H)  BUN 6 - 20 mg/dL 17 24(H) 21.9  Creatinine 0.61 - 1.24 mg/dL 1.05 1.13 1.2  Sodium 135 - 145 mmol/L 135 133(L) 139  Potassium 3.5 - 5.1 mmol/L 3.7 3.7 3.9  Chloride 101 - 111 mmol/L 107 99(L) -  CO2 22 - 32 mmol/L 24 24 26   Calcium 8.9 - 10.3 mg/dL 8.0(L) 8.7(L) 9.1  Total Protein 6.5 - 8.1 g/dL 5.0(L) 6.2(L) 7.0  Total Bilirubin 0.3 - 1.2 mg/dL 0.8 1.4(H) 0.46   Alkaline Phos 38 - 126 U/L 48 59 126  AST 15 - 41 U/L 15 20 21   ALT 17 - 63 U/L 10(L) 12(L) 12     Urine Studies No results for input(s): UHGB, CRYS in the last 72 hours.  Invalid input(s): UACOL, UAPR, USPG, UPH, UTP, UGL, UKET, UBIL, UNIT, UROB, ULEU, UEPI, UWBC, URBC, UBAC, CAST, UCOM, BILUA  Basic Metabolic Panel:  Recent Labs Lab 03/19/16 1004 03/20/16 0530  NA 133* 135  K 3.7 3.7  CL 99* 107  CO2 24 24  GLUCOSE 272* 223*  BUN 24* 17  CREATININE 1.13 1.05  CALCIUM 8.7* 8.0*   GFR Estimated Creatinine Clearance: 63.1 mL/min (by C-G formula based on SCr of 1.05 mg/dL). Liver Function Tests:  Recent Labs Lab 03/19/16 1004 03/20/16 0530  AST 20 15  ALT 12* 10*  ALKPHOS 59 48  BILITOT 1.4* 0.8  PROT 6.2* 5.0*  ALBUMIN 3.2* 2.6*    Recent Labs Lab 03/19/16 1004  LIPASE 22   No results for input(s): AMMONIA in the last 168 hours. Coagulation profile No results for input(s): INR,  PROTIME in the last 168 hours.  CBC:  Recent Labs Lab 03/19/16 1004 03/20/16 0530  WBC 5.3 3.9*  NEUTROABS 3.8  --   HGB 9.0* 7.3*  HCT 27.5* 22.3*  MCV 84.1 85.4  PLT 196 134*   Cardiac Enzymes: No results for input(s): CKTOTAL, CKMB, CKMBINDEX, TROPONINI in the last 168 hours. BNP: Invalid input(s): POCBNP CBG:  Recent Labs Lab 03/19/16 1709 03/19/16 2111 03/20/16 0747 03/20/16 1148 03/20/16 1726  GLUCAP 207* 181* 185* 187* 170*   D-Dimer No results for input(s): DDIMER in the last 72 hours. Hgb A1c No results for input(s): HGBA1C in the last 72 hours. Lipid Profile No results for input(s): CHOL, HDL, LDLCALC, TRIG, CHOLHDL, LDLDIRECT in the last 72 hours. Thyroid function studies No results for input(s): TSH, T4TOTAL, T3FREE, THYROIDAB in the last 72 hours.  Invalid input(s): FREET3 Anemia work up  Recent Labs  03/20/16 1300  FERRITIN 375*  TIBC 202*  IRON 38*  RETICCTPCT 0.4   Microbiology Recent Results (from the past 240 hour(s))   Gastrointestinal Panel by PCR , Stool     Status: None   Collection Time: 03/19/16  6:20 PM  Result Value Ref Range Status   Campylobacter species NOT DETECTED NOT DETECTED Final   Plesimonas shigelloides NOT DETECTED NOT DETECTED Final   Salmonella species NOT DETECTED NOT DETECTED Final   Yersinia enterocolitica NOT DETECTED NOT DETECTED Final   Vibrio species NOT DETECTED NOT DETECTED Final   Vibrio cholerae NOT DETECTED NOT DETECTED Final   Enteroaggregative E coli (EAEC) NOT DETECTED NOT DETECTED Final   Enteropathogenic E coli (EPEC) NOT DETECTED NOT DETECTED Final   Enterotoxigenic E coli (ETEC) NOT DETECTED NOT DETECTED Final   Shiga like toxin producing E coli (STEC) NOT DETECTED NOT DETECTED Final   Shigella/Enteroinvasive E coli (EIEC) NOT DETECTED NOT DETECTED Final   Cryptosporidium NOT DETECTED NOT DETECTED Final   Cyclospora cayetanensis NOT DETECTED NOT DETECTED Final   Entamoeba histolytica NOT DETECTED NOT DETECTED Final   Giardia lamblia NOT DETECTED NOT DETECTED Final   Adenovirus F40/41 NOT DETECTED NOT DETECTED Final   Astrovirus NOT DETECTED NOT DETECTED Final   Norovirus GI/GII NOT DETECTED NOT DETECTED Final   Rotavirus A NOT DETECTED NOT DETECTED Final   Sapovirus (I, II, IV, and V) NOT DETECTED NOT DETECTED Final  C difficile quick scan w PCR reflex     Status: None   Collection Time: 03/19/16  6:20 PM  Result Value Ref Range Status   C Diff antigen NEGATIVE NEGATIVE Final   C Diff toxin NEGATIVE NEGATIVE Final   C Diff interpretation No C. difficile detected.  Final      Studies:  Ct Abdomen Pelvis W Contrast  Result Date: 03/19/2016 CLINICAL DATA:  Abdominal pain, vomiting and weakness. Undergoing chemotherapy for small bowel cancer. EXAM: CT ABDOMEN AND PELVIS WITH CONTRAST TECHNIQUE: Multidetector CT imaging of the abdomen and pelvis was performed using the standard protocol following bolus administration of intravenous contrast. CONTRAST:  126mL  ISOVUE-300 IOPAMIDOL (ISOVUE-300) INJECTION 61% COMPARISON:  CT 11/26/2015 and 01/28/2016 FINDINGS: Lower chest: Several small nodules are again noted at both lung bases, measuring up to 6 mm in the left lower lobe on image 1. These appear similar to the most recent study. There is a calcified right middle lobe granuloma on image 7. No significant pleural or pericardial effusion. Hepatobiliary: The previously demonstrated multifocal hepatic metastatic disease appears lower in density and less well-defined, most consistent with response to  treatment. The individual lesions are difficult to accurately measure. The lesion in the dome of the right lobe measures approximately 2.9 x 2.8 cm on image number 10 compared with 4.2 x 3.2 cm previously. A lesion inferiorly in the right lobe measures 4.1 x 3.5 cm on image 24 (previously 5.5 x 4.4 cm). There are multiple other lesions. No definite new or enlarging lesions are seen. Calcified gallstones are again noted. There is no gallbladder wall thickening, surrounding inflammation or biliary dilatation. Pancreas: Unremarkable. No pancreatic ductal dilatation or surrounding inflammatory changes. Spleen: Normal in size without focal abnormality. Adrenals/Urinary Tract: Both adrenal glands appear normal. The kidneys appear stable with probable small cysts bilaterally. No evidence of urinary tract calculus or hydronephrosis. No significant bladder findings. Stomach/Bowel: The stomach, small bowel and appendix appear unremarkable. The colon is relatively decompressed, although demonstrates diffuse mild wall thickening and surrounding inflammation, especially in the descending and proximal sigmoid colon. No evidence of bowel obstruction or focal extraluminal fluid collection. Vascular/Lymphatic: Small retroperitoneal lymph nodes are stable. There is aortic and branch vessel atherosclerosis, but no evidence of large vessel occlusion. Reproductive: Stable mild enlargement of the  prostate gland the seminal vesicles appear normal. Other: Mild mesenteric edema. There is no ascites. The anterior abdominal wall appears unremarkable. Musculoskeletal: No acute or significant osseous findings. Lower lumbar spondylosis noted. IMPRESSION: 1. New colonic wall thickening and surrounding inflammation most consistent with diffuse colitis. Consider pseudomembranous (Clostridium difficile) colitis. Other considerations include viral infection and drug reaction. 2. No evidence of bowel obstruction, perforation or abscess. 3. Interval improvement in multifocal hepatic metastatic disease. 4. Stable nodularity at both lung bases. 5. Cholelithiasis and atherosclerosis again noted. Electronically Signed   By: Richardean Sale M.D.   On: 03/19/2016 12:10   Dg Abd Acute W/chest  Result Date: 03/19/2016 CLINICAL DATA:  Abdominal pain with nausea, vomiting, and diarrhea. Metastatic small bowel cancer. EXAM: DG ABDOMEN ACUTE W/ 1V CHEST COMPARISON:  Chest x-ray dated 09/07/2014 and CT scan of the chest, abdomen and pelvis dated 01/28/2016 FINDINGS: Power port in place. No infiltrates or effusions. Tiny pulmonary nodules present on the CT scan are not appreciable on this chest x-ray. No free air or free fluid. No dilated loops of large or small bowel. No significant bone abnormality. Small gallstones are noted. IMPRESSION: No acute abnormalities. Benign-appearing abdomen except for cholelithiasis. Electronically Signed   By: Lorriane Shire M.D.   On: 03/19/2016 11:16    Assessment: 70 y.o.   1. Acute colitis, ? Secondary to xeloda  2. Mild hematemesis and hematochezia, resolved now  3. Dehydration, resolved 4. Metastatic small bowel adenocarcinoma 5. Hypertension 6. 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 Bonner Puna today  -ID work up negative, he is on antibiotics  -hold chemo xeloda -I reviewed his  CT scan from yesterday, which showed improved liver metastasis, no other new lesions. -Urea cream for his palm and bottom of feet (ordered)  -we discussed changing his chemo to 5FU or FOLFOX when he recovers -If he has recurrent GI bleeding, please consult GI  -I will follow up    Truitt Merle, MD 03/20/2016

## 2016-03-21 ENCOUNTER — Other Ambulatory Visit: Payer: Medicare Other

## 2016-03-21 ENCOUNTER — Ambulatory Visit: Payer: Medicare Other | Admitting: Hematology

## 2016-03-21 DIAGNOSIS — K529 Noninfective gastroenteritis and colitis, unspecified: Secondary | ICD-10-CM | POA: Diagnosis not present

## 2016-03-21 LAB — CBC
HEMATOCRIT: 28.9 % — AB (ref 39.0–52.0)
HEMATOCRIT: 25.4 % — AB (ref 39.0–52.0)
HEMOGLOBIN: 9.7 g/dL — AB (ref 13.0–17.0)
Hemoglobin: 8.7 g/dL — ABNORMAL LOW (ref 13.0–17.0)
MCH: 28.2 pg (ref 26.0–34.0)
MCH: 28.9 pg (ref 26.0–34.0)
MCHC: 33.6 g/dL (ref 30.0–36.0)
MCHC: 34.3 g/dL (ref 30.0–36.0)
MCV: 84 fL (ref 78.0–100.0)
MCV: 84.4 fL (ref 78.0–100.0)
PLATELETS: 128 10*3/uL — AB (ref 150–400)
Platelets: 166 10*3/uL (ref 150–400)
RBC: 3.44 MIL/uL — AB (ref 4.22–5.81)
RBC: 3.01 MIL/uL — ABNORMAL LOW (ref 4.22–5.81)
RDW: 17.1 % — ABNORMAL HIGH (ref 11.5–15.5)
RDW: 17.2 % — AB (ref 11.5–15.5)
WBC: 5.5 10*3/uL (ref 4.0–10.5)
WBC: 4.1 10*3/uL (ref 4.0–10.5)

## 2016-03-21 LAB — TYPE AND SCREEN
ABO/RH(D): A POS
Antibody Screen: NEGATIVE
UNIT DIVISION: 0
Unit division: 0

## 2016-03-21 LAB — COMPREHENSIVE METABOLIC PANEL
ALT: 9 U/L — AB (ref 17–63)
AST: 16 U/L (ref 15–41)
Albumin: 2.5 g/dL — ABNORMAL LOW (ref 3.5–5.0)
Alkaline Phosphatase: 43 U/L (ref 38–126)
Anion gap: 3 — ABNORMAL LOW (ref 5–15)
BILIRUBIN TOTAL: 1.1 mg/dL (ref 0.3–1.2)
BUN: 11 mg/dL (ref 6–20)
CHLORIDE: 111 mmol/L (ref 101–111)
CO2: 24 mmol/L (ref 22–32)
CREATININE: 1.07 mg/dL (ref 0.61–1.24)
Calcium: 7.8 mg/dL — ABNORMAL LOW (ref 8.9–10.3)
Glucose, Bld: 139 mg/dL — ABNORMAL HIGH (ref 65–99)
Potassium: 3.3 mmol/L — ABNORMAL LOW (ref 3.5–5.1)
Sodium: 138 mmol/L (ref 135–145)
TOTAL PROTEIN: 4.8 g/dL — AB (ref 6.5–8.1)

## 2016-03-21 LAB — GLUCOSE, CAPILLARY
GLUCOSE-CAPILLARY: 174 mg/dL — AB (ref 65–99)
Glucose-Capillary: 138 mg/dL — ABNORMAL HIGH (ref 65–99)

## 2016-03-21 MED ORDER — POTASSIUM CHLORIDE CRYS ER 20 MEQ PO TBCR
20.0000 meq | EXTENDED_RELEASE_TABLET | Freq: Once | ORAL | Status: AC
  Administered 2016-03-21: 20 meq via ORAL
  Filled 2016-03-21: qty 1

## 2016-03-21 MED ORDER — HEPARIN SOD (PORK) LOCK FLUSH 100 UNIT/ML IV SOLN
500.0000 [IU] | INTRAVENOUS | Status: DC | PRN
  Filled 2016-03-21: qty 5

## 2016-03-21 MED ORDER — HEPARIN SOD (PORK) LOCK FLUSH 100 UNIT/ML IV SOLN: 500.0000 [IU] | INTRAVENOUS | Status: DC

## 2016-03-21 NOTE — Discharge Summary (Addendum)
Physician Discharge Summary  Tyler Evans [id number] DOB: [birth year] DOA: 03/19/2016  PCP: Kandice Hams, MD  Admit date: 03/19/2016 Discharge date: 03/21/2016  Admitted From: [facility] Disposition: Home   Recommendations for Outpatient Follow-up:  1. Follow up with PCP in 1-2 weeks 2. Please obtain CBC in one week. Discharge hgb 8.7 after 7.3 at admission s/p 2u PRBCs. 3. Follow up chemotherapy-induced peripheral neuropathy: Consider titrating dose of neurontin. 4. Consider iron therapy.  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 He has been having profuse non-bloody diarrhea for the past 3 days, associated with mid and lower abdominal pain and a single episode of blood-tinged emesis. He appeared dehydrated but in no distress upon arrival. Orthostatic vital signs were positive and + FOBT. Lab work showed sodium 133, Cl 99, BUN 24, Cr 1.13 (ratio>20) , T bili 1.4, Hb 9.0, U sp gravity >1.046. Diffuse colitis was noted on CT abdomen and pelvis, in addition to interval improvement in multifocal hepatic metastasis, stable nodularity in lung bases, and cholelithiasis. He was started on IV antibiotics, IV fluids, and brought in to the hospital for observation. CDiff assay and GI pathogen panel (see below) were negative, so antibiotics were discontinued. Symptoms improved off antibiotics and there were no further signs of bleeding. He is tolerating a regular diet and will be discharged with strict return precautions.   Discharge Diagnoses:  Principal Problem:   Colitis, acute Active Problems:   Protein-calorie malnutrition, severe (HCC)   Diabetes mellitus without complication ([place])    Hypercholesterolemia without hypertriglyceridemia   Peripheral neuropathy due to chemotherapy [facility])   Port catheter in place   Benign essential HTN   Hyponatremia   Dehydration   Anemia, chronic disease   Colitis   Gastrointestinal hemorrhage  Acute colitis: Drug reaction to xeloda vs. infectious etiology, though GI pathogen panel and C Diff assay were negative.  - Cipro + Flagyl started empirically, will D/C with negative pathogen testing. Monitor. - Advance diet as tolerated - Hydrocodone prn pain - Zofran prn nausea/emesis  Mild hematemesis: Acute, resolved. ?MW tear - PPI BID. If bleeding recurs, will consult GI.  Anemia: Acute on chronic problem. In setting of chemotherapy, hematemesis, hypotension.  - Transfuse 2u PRBCs and monitor H&H. - Retic, iron studies ordered prior to transfusion   Hyponatremia Dehydration Orthostatic hypotension: No signs of sepsis. Lactate normal. - s/p 1L NS bolus in ED followed by 125cc/hr - Continue IVF and po intake.  - Monitor I/O - Repeat orthostatics in AM  Mild elevation in bilirubin: Suspect due to dehydration -Recheck tomorrow  Metastatic adenocarcinoma of small bowel: Dx March 2016. Followed by Dr. Burr Medico, currently on 4th line treatment due to refractory course. - Primary in jejunum, metastasis inliver with decreasing size of liver metastases evident on CT this admission. - Started on xeloda 9/17: 2 weeks on / 1 week off. Completed 2 courses PTA. - Dr. Burr Medico has visited pt in hospital, hold off on restarting next round before conferring as outpatient.   Diabetes mellitus without complication: last [id number] in 3/16 was 7.7. CBGs just above inpatient goal. - Lantus restarted at10 units daily rather than 30 given poor po: Will increase to 15u tonight.   - sensitive ISS  Hypercholesterolemia: - Continue lipitor  Peripheral  neuropathy: Complicated of chemotherapy - Continue neurontin. Consider increasing dose (currently 200mg   TID)  Benign essential HTN: Chronic, stable. Borderline hypotensive currently. - hold lisinopril for now given dehydration/relative hypotension.  Discharge Instructions Discharge Instructions    Discharge instructions    Complete by:  As directed    - Stop taking naproxen. Take tylenol for pain.  - Follow up with Dr. Burr Medico to discuss next steps in chemotherapy. Do not restart xeloda.  - If you have worsening symptoms or inability to maintain your hydration status (take plenty of water and try to eat more) you must return for medical care.  - If you notice any black or tarry stools or blood in your stools, seek urgent medical care.       Medication List    STOP taking these medications   capecitabine 500 MG tablet Commonly known as:  XELODA   clindamycin 1 % gel Commonly known as:  CLINDAGEL   hydrocortisone 2.5 % cream   naproxen sodium 220 MG tablet Commonly known as:  ANAPROX   phenazopyridine 200 MG tablet Commonly known as:  PYRIDIUM     TAKE these medications   atorvastatin 20 MG tablet Commonly known as:  LIPITOR Take 20 mg by mouth every evening.   gabapentin 100 MG capsule Commonly known as:  NEURONTIN Take 2 capsules (200 mg total) by mouth 3 (three) times daily.   LANTUS SOLOSTAR 100 UNIT/ML Solostar Pen Generic drug:  Insulin Glargine Inject 30 Units into the skin every evening.   lidocaine-prilocaine cream Commonly known as:  EMLA Apply topically as needed. Apply to portacath 1 1/2 hours - 2 hours prior to procedures as needed. What changed:  how much to take  when to take this  reasons to take this  additional instructions   lisinopril 10 MG tablet Commonly known as:  PRINIVIL,ZESTRIL Take 10 mg by mouth every evening.   loperamide 2 MG capsule Commonly known as:  IMODIUM Take 4 mg by mouth 4 (four) times daily as needed for diarrhea or loose stools.   tamsulosin 0.4 MG Caps capsule Commonly known as:  FLOMAX Take 0.4 mg by mouth  every evening.      Follow-up Information    Kandice Hams, MD .   Specialty:  Internal Medicine Contact information: [address] [place] [address] [phone]        Truitt Merle, MD. Schedule an appointment as soon as possible for a visit today.   Specialties:  Hematology, Oncology Contact information: [address] [place] [address] [phone]          Allergies  Allergen Reactions  . Penicillins Nausea And Vomiting    Has patient had a PCN reaction causing immediate rash, facial/tongue/throat swelling, SOB or lightheadedness with hypotension: no Has patient had a PCN reaction causing severe rash involving mucus membranes or skin necrosis: unknown Has patient had a PCN reaction that required hospitalization : in Dr's office Has patient had a PCN reaction occurring within the last 10 years: no If all of the above answers are "NO", then may proceed with Cephalosporin use.     Consultations:  Oncology, Dr. Burr Medico  Procedures/Studies: Ct Abdomen Pelvis W Contrast  Result Date: 03/19/2016 CLINICAL DATA:  Abdominal pain, vomiting and weakness. Undergoing chemotherapy for small bowel cancer. EXAM: CT ABDOMEN AND PELVIS WITH CONTRAST TECHNIQUE: Multidetector CT imaging of the abdomen and pelvis was performed using the standard protocol following bolus administration of intravenous contrast. CONTRAST:  14mL ISOVUE-300 IOPAMIDOL (ISOVUE-300) INJECTION 61% COMPARISON:  CT 11/26/2015 and 01/28/2016 FINDINGS: Lower chest: Several small nodules are again noted at both lung bases, measuring up to 6 mm in the left lower lobe on image 1. These appear similar to the most recent study. There is a calcified right middle lobe granuloma on image 7. No significant pleural or pericardial effusion. Hepatobiliary: The previously demonstrated multifocal hepatic metastatic disease appears lower in density and less well-defined, most consistent with response to treatment.  The individual lesions are difficult to accurately measure. The lesion in the dome of the right lobe measures approximately 2.9 x 2.8 cm on image number 10 compared with 4.2 x 3.2 cm previously. A lesion inferiorly in the right lobe measures 4.1 x 3.5 cm on image 24 (previously 5.5 x 4.4 cm). There are multiple other lesions. No definite new or enlarging lesions are seen. Calcified gallstones are again noted. There is no gallbladder wall thickening, surrounding inflammation or biliary dilatation. Pancreas: Unremarkable. No pancreatic ductal dilatation or surrounding inflammatory changes. Spleen: Normal in size without focal abnormality. Adrenals/Urinary Tract: Both adrenal glands appear normal. The kidneys appear stable with probable small cysts bilaterally. No evidence of urinary tract calculus or hydronephrosis. No significant bladder findings. Stomach/Bowel: The stomach, small bowel and appendix appear unremarkable. The colon is relatively decompressed, although demonstrates diffuse mild wall thickening and surrounding inflammation, especially in the descending and proximal sigmoid colon. No evidence of bowel obstruction or focal extraluminal fluid collection. Vascular/Lymphatic: Small retroperitoneal lymph nodes are stable. There is aortic and branch vessel atherosclerosis, but no evidence of large vessel occlusion. Reproductive: Stable mild enlargement of the prostate gland the seminal vesicles appear normal. Other: Mild mesenteric edema. There is no ascites. The anterior abdominal wall appears unremarkable. Musculoskeletal: No acute or significant osseous findings. Lower lumbar spondylosis noted. IMPRESSION: 1. New colonic wall thickening and surrounding inflammation most consistent with diffuse colitis. Consider pseudomembranous (Clostridium difficile) colitis. Other considerations include viral infection and drug reaction. 2. No evidence of bowel obstruction, perforation or abscess. 3. Interval improvement  in multifocal hepatic metastatic disease. 4. Stable nodularity at both lung bases. 5. Cholelithiasis and atherosclerosis again noted. Electronically Signed   By: Richardean Sale M.D.   On: 03/19/2016 12:10   Dg Abd Acute W/chest  Result Date: 03/19/2016 CLINICAL DATA:  Abdominal pain with nausea, vomiting, and diarrhea. Metastatic small bowel cancer. EXAM: DG ABDOMEN ACUTE W/ 1V CHEST COMPARISON:  Chest x-ray dated 09/07/2014 and CT scan of the chest, abdomen and pelvis dated 01/28/2016 FINDINGS: Power port in place. No infiltrates or effusions. Tiny pulmonary nodules present on the CT scan are not appreciable on this chest x-ray. No free air or free fluid. No dilated loops of large or small bowel. No significant bone abnormality. Small gallstones are noted. IMPRESSION: No acute abnormalities. Benign-appearing abdomen except for cholelithiasis. Electronically Signed   By: Lorriane Shire M.D.   On: 03/19/2016 11:16     Subjective: Pt feels well. Improved symptoms, with bowel movements becoming less frequent. Still no blood. No further emesis. Wants to go home.   Discharge Exam: Vitals:   03/20/16 2202 03/21/16 0530  BP: 122/73 122/63  Pulse: 75 74  Resp: 16 20  Temp: 97.8 F (36.6 C) 98.2 F (36.8 C)   Vitals:   03/20/16 1800 03/20/16 2015 03/20/16 2202 03/21/16 0530  BP: 115/67 120/65 122/73 122/63  Pulse:  79 75 74  Resp:  16 16 20   Temp: 97.9 F (36.6 C) 98 F (  36.7 C) 97.8 F (36.6 C) 98.2 F (36.8 C)  TempSrc: Oral Oral Oral Oral  SpO2: 100% 100% 100% 99%  Weight:      Height:       General: Pt is alert, awake, not in acute distress Cardiovascular: RRR, S1/S2 +, no rubs, no gallops Respiratory: CTA bilaterally, no wheezing, no rhonchi Abdominal: Soft, NT, ND, bowel sounds + Extremities: no edema, no cyanosis Skin: Diffusely hyperpigmented mostly on palms, neck, and cheeks.  The results of significant diagnostics from this hospitalization (including imaging,  microbiology, ancillary and laboratory) are listed below for reference.    Microbiology: Recent Results (from the past 240 hour(s))  Gastrointestinal Panel by PCR , Stool     Status: None   Collection Time: 03/19/16  6:20 PM  Result Value Ref Range Status   Campylobacter species NOT DETECTED NOT DETECTED Final   Plesimonas shigelloides NOT DETECTED NOT DETECTED Final   Salmonella species NOT DETECTED NOT DETECTED Final   Yersinia enterocolitica NOT DETECTED NOT DETECTED Final   Vibrio species NOT DETECTED NOT DETECTED Final   Vibrio cholerae NOT DETECTED NOT DETECTED Final   Enteroaggregative E coli (EAEC) NOT DETECTED NOT DETECTED Final   Enteropathogenic E coli (EPEC) NOT DETECTED NOT DETECTED Final   Enterotoxigenic E coli (ETEC) NOT DETECTED NOT DETECTED Final   Shiga like toxin producing E coli (STEC) NOT DETECTED NOT DETECTED Final   Shigella/Enteroinvasive E coli (EIEC) NOT DETECTED NOT DETECTED Final   Cryptosporidium NOT DETECTED NOT DETECTED Final   Cyclospora cayetanensis NOT DETECTED NOT DETECTED Final   Entamoeba histolytica NOT DETECTED NOT DETECTED Final   Giardia lamblia NOT DETECTED NOT DETECTED Final   Adenovirus F40/41 NOT DETECTED NOT DETECTED Final   Astrovirus NOT DETECTED NOT DETECTED Final   Norovirus GI/GII NOT DETECTED NOT DETECTED Final   Rotavirus A NOT DETECTED NOT DETECTED Final   Sapovirus (I, II, IV, and V) NOT DETECTED NOT DETECTED Final  C difficile quick scan w PCR reflex     Status: None   Collection Time: 03/19/16  6:20 PM  Result Value Ref Range Status   C Diff antigen NEGATIVE NEGATIVE Final   C Diff toxin NEGATIVE NEGATIVE Final   C Diff interpretation No C. difficile detected.  Final     Labs: BNP (last 3 results) No results for input(s): BNP in the last 8760 hours. Basic Metabolic Panel:  Recent Labs Lab 03/19/16 1004 03/20/16 0530 03/21/16 0511  NA 133* 135 138  K 3.7 3.7 3.3*  CL 99* 107 111  CO2 24 24 24   GLUCOSE 272* 223*  139*  BUN 24* 17 11  CREATININE 1.13 1.05 1.07  CALCIUM 8.7* 8.0* 7.8*   Liver Function Tests:  Recent Labs Lab 03/19/16 1004 03/20/16 0530 03/21/16 0511  AST 20 15 16   ALT 12* 10* 9*  ALKPHOS 59 48 43  BILITOT 1.4* 0.8 1.1  PROT 6.2* 5.0* 4.8*  ALBUMIN 3.2* 2.6* 2.5*    Recent Labs Lab 03/19/16 1004  LIPASE 22   No results for input(s): AMMONIA in the last 168 hours. CBC:  Recent Labs Lab 03/19/16 1004 03/20/16 0530 03/20/16 2241 03/21/16 0511  WBC 5.3 3.9* 5.5 4.1  NEUTROABS 3.8  --   --   --   HGB 9.0* 7.3* 9.7* 8.7*  HCT 27.5* 22.3* 28.9* 25.4*  MCV 84.1 85.4 84.0 84.4  PLT 196 134* 166 128*   Cardiac Enzymes: No results for input(s): CKTOTAL, CKMB, CKMBINDEX, TROPONINI in the  last 168 hours. BNP: Invalid input(s): POCBNP CBG:  Recent Labs Lab 03/20/16 1148 03/20/16 1726 03/20/16 2158 03/21/16 0829 03/21/16 1240  GLUCAP 187* 170* 176* 138* 174*   D-Dimer No results for input(s): DDIMER in the last 72 hours. Hgb A1c No results for input(s): HGBA1C in the last 72 hours. Lipid Profile No results for input(s): CHOL, HDL, LDLCALC, TRIG, CHOLHDL, LDLDIRECT in the last 72 hours. Thyroid function studies No results for input(s): TSH, T4TOTAL, T3FREE, THYROIDAB in the last 72 hours.  Invalid input(s): FREET3 Anemia work up  Recent Labs  03/20/16 1300  FERRITIN 375*  TIBC 202*  IRON 38*  RETICCTPCT 0.4   Urinalysis    Component Value Date/Time   COLORURINE AMBER (A) 03/19/2016 1158   APPEARANCEUR CLEAR 03/19/2016 1158   LABSPEC >1.046 (H) 03/19/2016 1158   PHURINE 6.0 03/19/2016 1158   GLUCOSEU 100 (A) 03/19/2016 1158   HGBUR NEGATIVE 03/19/2016 1158   BILIRUBINUR SMALL (A) 03/19/2016 1158   KETONESUR NEGATIVE 03/19/2016 1158   PROTEINUR 30 (A) 03/19/2016 1158   UROBILINOGEN 0.2 09/08/2014 0021   NITRITE NEGATIVE 03/19/2016 1158   LEUKOCYTESUR NEGATIVE 03/19/2016 1158   Sepsis Labs Invalid input(s): PROCALCITONIN,  WBC,   LACTICIDVEN Microbiology Recent Results (from the past 240 hour(s))  Gastrointestinal Panel by PCR , Stool     Status: None   Collection Time: 03/19/16  6:20 PM  Result Value Ref Range Status   Campylobacter species NOT DETECTED NOT DETECTED Final   Plesimonas shigelloides NOT DETECTED NOT DETECTED Final   Salmonella species NOT DETECTED NOT DETECTED Final   Yersinia enterocolitica NOT DETECTED NOT DETECTED Final   Vibrio species NOT DETECTED NOT DETECTED Final   Vibrio cholerae NOT DETECTED NOT DETECTED Final   Enteroaggregative E coli (EAEC) NOT DETECTED NOT DETECTED Final   Enteropathogenic E coli (EPEC) NOT DETECTED NOT DETECTED Final   Enterotoxigenic E coli (ETEC) NOT DETECTED NOT DETECTED Final   Shiga like toxin producing E coli (STEC) NOT DETECTED NOT DETECTED Final   Shigella/Enteroinvasive E coli (EIEC) NOT DETECTED NOT DETECTED Final   Cryptosporidium NOT DETECTED NOT DETECTED Final   Cyclospora cayetanensis NOT DETECTED NOT DETECTED Final   Entamoeba histolytica NOT DETECTED NOT DETECTED Final   Giardia lamblia NOT DETECTED NOT DETECTED Final   Adenovirus F40/41 NOT DETECTED NOT DETECTED Final   Astrovirus NOT DETECTED NOT DETECTED Final   Norovirus GI/GII NOT DETECTED NOT DETECTED Final   Rotavirus A NOT DETECTED NOT DETECTED Final   Sapovirus (I, II, IV, and V) NOT DETECTED NOT DETECTED Final  C difficile quick scan w PCR reflex     Status: None   Collection Time: 03/19/16  6:20 PM  Result Value Ref Range Status   C Diff antigen NEGATIVE NEGATIVE Final   C Diff toxin NEGATIVE NEGATIVE Final   C Diff interpretation No C. difficile detected.  Final    Time coordinating discharge: Over 30 minutes  Vance Gather, MD  [facility] 03/21/2016, 3:42 PM Pager [phone]  If 7PM-7AM, please contact night-coverage [email or web address] Password [email or web address]

## 2016-03-21 NOTE — Progress Notes (Signed)
Pt discharged home with spouse and daughter in stable condition. Discharge instructions given. Pt and spouse verbalized understanding. No immediate questions or concerns at this time.

## 2016-03-31 ENCOUNTER — Other Ambulatory Visit (HOSPITAL_BASED_OUTPATIENT_CLINIC_OR_DEPARTMENT_OTHER): Payer: Medicare Other

## 2016-03-31 ENCOUNTER — Ambulatory Visit (HOSPITAL_BASED_OUTPATIENT_CLINIC_OR_DEPARTMENT_OTHER): Payer: Medicare Other | Admitting: Nurse Practitioner

## 2016-03-31 VITALS — BP 118/59 | HR 79 | Temp 97.8°F | Resp 18 | Ht 66.0 in | Wt 171.0 lb

## 2016-03-31 DIAGNOSIS — C179 Malignant neoplasm of small intestine, unspecified: Secondary | ICD-10-CM | POA: Diagnosis not present

## 2016-03-31 DIAGNOSIS — E119 Type 2 diabetes mellitus without complications: Secondary | ICD-10-CM

## 2016-03-31 DIAGNOSIS — C787 Secondary malignant neoplasm of liver and intrahepatic bile duct: Secondary | ICD-10-CM

## 2016-03-31 DIAGNOSIS — G620 Drug-induced polyneuropathy: Secondary | ICD-10-CM

## 2016-03-31 DIAGNOSIS — L271 Localized skin eruption due to drugs and medicaments taken internally: Secondary | ICD-10-CM

## 2016-03-31 DIAGNOSIS — D509 Iron deficiency anemia, unspecified: Secondary | ICD-10-CM

## 2016-03-31 DIAGNOSIS — C772 Secondary and unspecified malignant neoplasm of intra-abdominal lymph nodes: Secondary | ICD-10-CM

## 2016-03-31 DIAGNOSIS — I10 Essential (primary) hypertension: Secondary | ICD-10-CM

## 2016-03-31 DIAGNOSIS — D6481 Anemia due to antineoplastic chemotherapy: Secondary | ICD-10-CM

## 2016-03-31 DIAGNOSIS — R197 Diarrhea, unspecified: Secondary | ICD-10-CM

## 2016-03-31 DIAGNOSIS — K529 Noninfective gastroenteritis and colitis, unspecified: Secondary | ICD-10-CM

## 2016-03-31 DIAGNOSIS — T451X5A Adverse effect of antineoplastic and immunosuppressive drugs, initial encounter: Secondary | ICD-10-CM

## 2016-03-31 DIAGNOSIS — L818 Other specified disorders of pigmentation: Secondary | ICD-10-CM

## 2016-03-31 DIAGNOSIS — D500 Iron deficiency anemia secondary to blood loss (chronic): Secondary | ICD-10-CM

## 2016-03-31 LAB — CBC WITH DIFFERENTIAL/PLATELET
BASO%: 0.3 % (ref 0.0–2.0)
BASOS ABS: 0 10*3/uL (ref 0.0–0.1)
EOS ABS: 0.1 10*3/uL (ref 0.0–0.5)
EOS%: 1.2 % (ref 0.0–7.0)
HEMATOCRIT: 34.8 % — AB (ref 38.4–49.9)
HGB: 11.6 g/dL — ABNORMAL LOW (ref 13.0–17.1)
LYMPH#: 1.9 10*3/uL (ref 0.9–3.3)
LYMPH%: 23.7 % (ref 14.0–49.0)
MCH: 29.5 pg (ref 27.2–33.4)
MCHC: 33.3 g/dL (ref 32.0–36.0)
MCV: 88.5 fL (ref 79.3–98.0)
MONO#: 0.7 10*3/uL (ref 0.1–0.9)
MONO%: 9.3 % (ref 0.0–14.0)
NEUT#: 5.1 10*3/uL (ref 1.5–6.5)
NEUT%: 65.5 % (ref 39.0–75.0)
PLATELETS: 163 10*3/uL (ref 140–400)
RBC: 3.93 10*6/uL — AB (ref 4.20–5.82)
RDW: 21.3 % — ABNORMAL HIGH (ref 11.0–14.6)
WBC: 7.8 10*3/uL (ref 4.0–10.3)

## 2016-03-31 LAB — COMPREHENSIVE METABOLIC PANEL
ALT: 19 U/L (ref 0–55)
ANION GAP: 10 meq/L (ref 3–11)
AST: 27 U/L (ref 5–34)
Albumin: 2.5 g/dL — ABNORMAL LOW (ref 3.5–5.0)
Alkaline Phosphatase: 78 U/L (ref 40–150)
BILIRUBIN TOTAL: 1.07 mg/dL (ref 0.20–1.20)
BUN: 17.1 mg/dL (ref 7.0–26.0)
CALCIUM: 8.2 mg/dL — AB (ref 8.4–10.4)
CHLORIDE: 108 meq/L (ref 98–109)
CO2: 24 meq/L (ref 22–29)
CREATININE: 1.2 mg/dL (ref 0.7–1.3)
EGFR: 57 mL/min/{1.73_m2} — AB (ref >90)
Glucose: 203 mg/dl — ABNORMAL HIGH (ref 70–140)
Potassium: 3 mEq/L — CL (ref 3.5–5.1)
Sodium: 141 mEq/L (ref 136–145)
Total Protein: 5.7 g/dL — ABNORMAL LOW (ref 6.4–8.3)

## 2016-03-31 LAB — CEA (IN HOUSE-CHCC): CEA (CHCC-IN HOUSE): 295.21 ng/mL — AB (ref 0.00–5.00)

## 2016-03-31 MED ORDER — POTASSIUM CHLORIDE CRYS ER 20 MEQ PO TBCR: EXTENDED_RELEASE_TABLET | ORAL | 1 refills | Status: DC

## 2016-03-31 MED ORDER — DIPHENOXYLATE-ATROPINE 2.5-0.025 MG PO TABS: 2.0000 | ORAL_TABLET | Freq: Four times a day (QID) | ORAL | 0 refills | Status: AC | PRN

## 2016-03-31 MED ORDER — GABAPENTIN 100 MG PO CAPS: 200.0000 mg | ORAL_CAPSULE | Freq: Three times a day (TID) | ORAL | 1 refills | Status: DC

## 2016-03-31 NOTE — Progress Notes (Signed)
[facility] OFFICE PROGRESS NOTE   Diagnosis:  Small bowel cancer metastatic to liver Oncology History   Small bowel cancer   Staging form: Small Intestine, AJCC 7th Edition     Clinical: Stage IV ([place], N1, M1) - Unsigned      Small bowel cancer ([place])   08/17/2014 Imaging    CT abdomen/pelvis without contrast showed multiple large masses within the liver and 2.5cm mass within the proximal jejunum.      08/18/2014 Miscellaneous    tumor KRAS mutation (-)     08/18/2014 Pathology Results    Liver biopsy showed metastatic adenocarcinoma, consistent with GI primary     08/18/2014 Initial Diagnosis    Small bowel cancer     08/18/2014 Procedure    small bowel enteroscopy with biopsy by Dr. Benson Norway showed at the proximal jejunum there was evidence of abnormal mucosa and friability, biopsy was obtained.      09/05/2014 Imaging    PET hypermetabolic mass involving a small bowel loop with adjacent mesenteric lymphadenopathy, and diffuse liver metastasis and mild hypermetabolic lymphadenopathy in porta hepatis.     09/06/2014 - 02/01/2015 Chemotherapy    mFOLFOX, stopped due to his neuropathy, and earlier mild disease progression on PET      09/07/2014 - 09/11/2014 Hospital Admission    He was admitted for fever and GI bleeding. Received 3 units of RBC.     10/26/2014 Imaging    Interval significant improvement in the primary small bowel mass, adjacent lymphadenopathy and extensive hepatic metastatic disease. No other new lesions.      02/09/2015 Imaging    PET/CT scan showed stable primary malignancy in the proximal jejunum, mild metabolic progression of the 3 residual metabolic liver metastasis. CT  Portion showed decreased size of his liver metastasis.     02/27/2015 - 11/06/2015 Chemotherapy    second line chemo FOLFIRI, every 2 weeks, and panitumumab (held for second cycle due to severe skin rashes), chemotherapy stopped due to disease  progression.     08/06/2015 Imaging    Stable number and size of the liver lesions. The left lower lobe nodule is considerably less prominent. No other new lesions.     11/26/2015 Progression    Restaging CT chest, abdomen and pelvis with contrast showed interval increase in size of liver lesions, multiple small pulmonary nodules are not significantly changed in the interval. Her tumor marker CEA also increased significantly     12/07/2015 - 01/16/2016 Chemotherapy    Gemcitabine 1000 mg/m2 (decreased to 800 mg/m from second dose), and a one and 8 every 21 days, stopped due to disease progression      02/12/2016 -  Chemotherapy    Xeloda 1082m/m2 twice daily, 2 weeks on, one-week off      INTERVAL HISTORY:   Tyler Evans for follow-up. He began cycle 2 Xeloda 03/04/2016. He was hospitalized 03/19/2016 with severe diarrhea and a single episode of blood tinged emesis. CT abdomen/pelvis showed new colonic wall thickening and surrounding inflammation most consistent with diffuse colitis. There was interval improvement in multifocal hepatic metastatic disease. Symptoms improved and there were no further signs of bleeding. He was discharged home 03/21/2016.  He reports persistent diarrhea. Stools are watery. He estimates 3-4 loose stools a day. Imodium is not effective. He notes mild abdominal pain after eating. Appetite is poor. No vomiting. No bleeding. No mouth sores. He notes his eyes are "watery". Hands are "peeling". Skin is hyperpigmented.  Objective:  Vital  signs in last 24 hours:  Blood pressure (!) 118/59, pulse 79, temperature 97.8 F (36.6 C), temperature source Oral, resp. rate 18, height _0  (1.676 m), weight 171 lb (77.6 kg), SpO2 100 %.    HEENT: No thrush or ulcers. Resp: Lungs clear bilaterally. Cardio: Regular rate and rhythm. GI: Abdomen soft and nontender. No hepatomegaly. Vascular: No leg edema. Skin: Skin over face appears hyperpigmented. Palms  with scattered areas of dry desquamation. Feet dry appearing with small linear ulcerations at the base of each toe plantar surface. Port-A-Cath without erythema.  Lab Results:  Lab Results  Component Value Date   WBC 7.8 03/31/2016   HGB 11.6 (L) 03/31/2016   HCT 34.8 (L) 03/31/2016   MCV 88.5 03/31/2016   PLT 163 03/31/2016   NEUTROABS 5.1 03/31/2016    Imaging:  No results found.  Medications: I have reviewed the patient's current medications.  Assessment/Plan: 1. 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 CT scan on 03/19/2016 while he was hospitalized with severe diarrhea showed improvement in metastatic disease involving the liver. 2. Microcytic anemia secondary to GI bleeding, iron deficiency and chemotherapy 3. Hypertension and diabetes mellitus 4. Peripheral neuropathy secondary to chemotherapy. He continues Neurontin. 5. Hospitalization 03/19/2016 through 03/21/2016 with severe diarrhea, single episode of blood-tinged emesis; CT scan showed new colonic wall thickening and surrounding inflammation most consistent with diffuse colitis. Metastatic disease involving the liver appeared improved. 6. Hand-foot syndrome secondary to Xeloda. 7. Skin hyperpigmentation secondary to Xeloda.   Disposition: Tyler Evans has completed 2 cycles of Xeloda. He was recently hospitalized with severe diarrhea likely related to Xeloda. In addition to the diarrhea he developed hand-foot syndrome and skin hyperpigmentation. Dr. Burr Medico recommends discontinuation of Xeloda. Treatment will be changed to 5-FU as per the FOLFOX regimen.  He continues to have diarrhea. I sent a prescription to his pharmacy for Lomotil 2 tabs 4 times daily as needed. He will contact the office if this is not effective.  He is hypokalemic likely related to  the diarrhea. I sent a prescription to his pharmacy for Kdur 20 mEq twice a day for 2 days then 20 mEq daily.  He will return for a follow-up visit and possible initiation of 5-FU in 2 weeks. He will contact the office in the interim as outlined above or with any other problems.  Plan reviewed with Dr. Burr Medico.    Tyler Evans ANP/GNP-BC   03/31/2016  1:46 PM

## 2016-04-11 ENCOUNTER — Telehealth: Payer: Self-pay | Admitting: Hematology

## 2016-04-11 NOTE — Telephone Encounter (Signed)
SPOKE WITH PATIENT RE APPOINTMENTS FOR 11/13 - PATIENT ALSO AWARE [place] WILL BE 11/14. PATIENT WILL NEW SCHEDULE 11/13.

## 2016-04-13 NOTE — Progress Notes (Signed)
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       08/18/2014 Miscellaneous    tumor KRAS mutation (-)      08/18/2014 Pathology Results    Liver biopsy showed metastatic adenocarcinoma, consistent with GI primary      08/18/2014 Initial Diagnosis    Small bowel cancer      08/18/2014 Procedure    small bowel enteroscopy with biopsy by Dr. Benson Norway showed at the proximal jejunum there was evidence of abnormal mucosa and friability, biopsy was obtained.       09/05/2014 Imaging    PET hypermetabolic mass involving a small bowel loop with adjacent mesenteric lymphadenopathy, and diffuse liver metastasis and mild hypermetabolic lymphadenopathy in porta hepatis.      09/06/2014 - 02/01/2015 Chemotherapy    mFOLFOX, stopped due to his neuropathy, and earlier mild disease progression on PET       09/07/2014 - 09/11/2014 Hospital Admission    He was admitted for fever and GI bleeding. Received 3 units of RBC.      10/26/2014 Imaging    Interval significant improvement in the primary small bowel mass, adjacent lymphadenopathy and extensive hepatic metastatic disease. No other new lesions.       02/09/2015 Imaging    PET/CT scan showed stable primary malignancy in the proximal jejunum, mild metabolic progression of the 3 residual metabolic liver metastasis. CT  Portion showed decreased size of his liver metastasis.      02/27/2015 - 11/06/2015 Chemotherapy    second line  chemo FOLFIRI, every 2 weeks, and panitumumab (held for second cycle due to severe skin rashes), chemotherapy stopped due to disease progression.      08/06/2015 Imaging    Stable number and size of the liver lesions. The left lower lobe nodule is considerably less prominent. No other new lesions.      11/26/2015 Progression    Restaging CT chest, abdomen and pelvis with contrast showed interval increase in size of liver lesions, multiple small pulmonary nodules are not significantly changed in the interval. Her tumor marker CEA also increased significantly      12/07/2015 - 01/16/2016 Chemotherapy    Gemcitabine 1000 mg/m2 (decreased to 800 mg/m from second dose), and a one and 8 every 21 days, stopped due to disease progression       02/12/2016 - 03/19/2016 Chemotherapy    Xeloda '1000mg'$ /m2 twice daily, 2 weeks on, one-week off, stopped after 2 cycles due to severe side effects (diarrhea, skin toxicities)      03/19/2016 - 03/21/2016 Hospital Admission    Patient was admitted for severe diarrhea, dehydration after second cycle of Xeloda, stoll c-diff was negative. He received IV fluids and supportive care.        HISTORY OF PRESENTING ILLNESS:  Tyler Evans 70 y.o. male is here because of a recent diagnosis of small bowel cancer. He presented to his PCP on 08/17/2014 with complaints  of feeling weak and a one month history of a cough. He was found to be anemic and was referred to the emergency department. Hemoglobin was found to be 7.6, MCV 76.6; creatinine was elevated at 1.74. Stool was Hemoccult positive.  CT abdomen/pelvis without contrast on 08/17/2014 showed multiple large masses within the liver, bulky in appearance. The largest measured approximately 5.7 cm. There appeared to be a focal filling defect within the proximal jejunum measuring approximately 2.5 x 1.7 cm. There was mild adjacent jejunal wall thickening. The lung bases were clear.  On 08/18/2014 he underwent a small  bowel enteroscopy with biopsy by Dr. Benson Norway. The esophagus and gastric lumen were normal. At the proximal jejunum there was evidence of abnormal mucosa and friability. The pediatric colonoscope was not able to traverse the area. Biopsies were obtained. An ultraslim colonoscope was then utilized. This colonoscope was able to traverse the area of stenosis which measured approximately 3 cm in length. 50% of the lumen was ulcerated. 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 MMR stains are pending.  He was discharged home on 08/19/2014.  CURRENT THERAPY: pending 5-fu, starting tomorrow   INTERIM HISTORY:  Tyler Evans returns for follow-up. He is accompanied by his wife to the clinic today. He was admitted to hospital after his second cycle of Xeloda, for severe diarrhea, dehydration and skin reaction. He recovered slowly, but feels much better overall lately. His appetite and energy level has been back to his normal level, he denies any pain, nausea, or other symptoms. His weight is stable.   This is a white MEDICAL HISTORY:  Past Medical History:  Diagnosis Date  . Diabetes mellitus without complication ([place])   . Hypertension   . Small bowel cancer ([place]) 08/18/2014    SURGICAL HISTORY: Past Surgical History:  Procedure Laterality Date  . ENTEROSCOPY N/A 08/18/2014   Procedure: ENTEROSCOPY;  Surgeon: Carol Ada, MD;  Location: [facility];  Service: Endoscopy;  Laterality: N/A;    SOCIAL HISTORY: History   Social History  . Marital Status: Married    Spouse Name: N/A  . Number of Children: 2  . Years of Education: N/A   Occupational History  .  retired Ambulance person    Social History Main Topics  . Smoking status: Never Smoker   . Smokeless tobacco: Not on file  . Alcohol Use: No  . Drug Use: Not on file  . Sexual Activity: Not on file   Other Topics Concern  . Not on file   Social History Narrative  He lives in [place]. He is  married. He has 2 children both reported to be in good health. No tobacco or alcohol use. He is a retired Ambulance person.  FAMILY HISTORY: Family History  Problem Relation Age of Onset  . Heart failure, Alzheimer's  Mother   . Lung cancer Father   . Kidney cancer Brother     ALLERGIES:  is allergic to penicillins.  MEDICATIONS:  Current Outpatient Prescriptions on File Prior to Visit  Medication Sig Dispense Refill  . atorvastatin (LIPITOR) 20 MG tablet Take 20 mg by mouth every evening.     . gabapentin (NEURONTIN) 100 MG capsule Take 2 capsules (200 mg total) by mouth 3 (three) times daily. 180 capsule 1  . LANTUS SOLOSTAR 100 UNIT/ML Solostar Pen Inject 20 Units into the skin every evening.     . lidocaine-prilocaine (EMLA) cream Apply topically as needed. Apply to portacath 1 1/2 hours - 2 hours  prior to procedures as needed. (Patient taking differently: Apply 1 application topically daily as needed (port access). Apply to portacath 1 1/2 hours - 2 hours prior to procedures) 30 g 2  . lisinopril (PRINIVIL,ZESTRIL) 10 MG tablet Take 10 mg by mouth every evening.   6  . potassium chloride SA (K-DUR,KLOR-CON) 20 MEQ tablet Take 1 tab twice a day for 2 days then take 1 tab daily (Patient taking differently: Take 20 mEq by mouth daily. ) 30 tablet 1  . tamsulosin (FLOMAX) 0.4 MG CAPS capsule Take 0.4 mg by mouth every evening.   12  . diphenoxylate-atropine (LOMOTIL) 2.5-0.025 MG tablet Take 2 tablets by mouth 4 (four) times daily as needed for diarrhea or loose stools. (Patient not taking: Reported on 04/14/2016) 60 tablet 0  . loperamide (IMODIUM) 2 MG capsule Take 4 mg by mouth 4 (four) times daily as needed for diarrhea or loose stools.     No current facility-administered medications on file prior to visit.   ;  REVIEW OF SYSTEMS:   Constitutional: Denies fevers, chills or abnormal night sweats; mild fatigue, weight is stable.  Eyes: Denies blurriness of vision, double vision or  watery eyes Ears, nose, mouth, throat, and face: Denies mucositis or sore throat Respiratory: One month history of a nonproductive cough;  no shortness of breath Cardiovascular: Denies palpitation, chest discomfort or lower extremity swelling Gastrointestinal:  Denies nausea, heartburn. No dysphagia. He has noted less frequent bowel movements over the past 2 weeks. Does not characterize this as constipation. Skin: Denies abnormal skin rashes Lymphatics: Denies new lymphadenopathy or easy bruising Neurological: Denies numbness, tingling. No extremity weakness Behavioral/Psych: Mood is stable, no new changes  All other systems were reviewed with the patient and are negative.  PHYSICAL EXAMINATION: ECOG PERFORMANCE STATUS: 1  Vitals:   04/14/16 1041  BP: 109/62  Pulse: 60  Resp: 17  Temp: 97.6 F (36.4 C)   Filed Weights   04/14/16 1041  Weight: 171 lb 6.4 oz (77.7 kg)    GENERAL:alert, no distress and comfortable SKIN: skin color, texture, turgor are normal, (+) skin pigmentation On his face, and hands, from her recent Xeloda. Skin peeling on palms has much improved.   EYES: normal, conjunctiva are pink and non-injected, sclera clear OROPHARYNX:no exudate, no erythema and lips, buccal mucosa, and tongue normal  NECK: supple, thyroid normal size, non-tender, without nodularity LYMPH:  no palpable lymphadenopathy in the cervical, axillary or inguinal regions LUNGS: clear to auscultation and percussion with normal breathing effort HEART: regular rate & rhythm and no murmurs and no lower extremity edema ABDOMEN:abdomen soft, mild tenderness at the right upper quadrant , no rebound pain, normal bowel sounds Musculoskeletal:no cyanosis of digits and no clubbing  PSYCH: alert & oriented x 3 with fluent speech NEURO: no focal motor deficits, his light touch sensation and vibration sensation are diminished on his hands and feet.   LABORATORY DATA:  I have reviewed the data as  listed CBC Latest Ref Rng & Units 04/14/2016 03/31/2016 03/21/2016  WBC 4.0 - 10.3 10e3/uL 6.3 7.8 4.1  Hemoglobin 13.0 - 17.1 g/dL 9.3(L) 11.6(L) 8.7(L)  Hematocrit 38.4 - 49.9 % 28.7(L) 34.8(L) 25.4(L)  Platelets 140 - 400 10e3/uL 124(L) 163 128(L)    CMP Latest Ref Rng & Units 04/14/2016 03/31/2016 03/21/2016  Glucose 70 - 140 mg/dl 90 203(H) 139(H)  BUN 7.0 - 26.0 mg/dL 11.8 17.1 11  Creatinine 0.7 - 1.3 mg/dL 1.0 1.2 1.07  Sodium 136 - 145 mEq/L  141 141 138  Potassium 3.5 - 5.1 mEq/L 3.8 3.0(LL) 3.3(L)  Chloride 101 - 111 mmol/L - - 111  CO2 22 - 29 mEq/L '24 24 24  '$ Calcium 8.4 - 10.4 mg/dL 8.6 8.2(L) 7.8(L)  Total Protein 6.4 - 8.3 g/dL 5.7(L) 5.7(L) 4.8(L)  Total Bilirubin 0.20 - 1.20 mg/dL 0.40 1.07 1.1  Alkaline Phos 40 - 150 U/L 88 78 43  AST 5 - 34 U/L '28 27 16  '$ ALT 0 - 55 U/L 20 19 9(L)   CEA: 08/31/2014: 189 12/12/2014: 9.0 03/27/2015: 5.0 05/29/2015: 17.6 07/09/2015: 41 11/26/2015: 153 01/30/2016: 523 02/29/2016: 985 03/31/2016: 295  Pathology report:  Small Intestine Biopsy, jejunal mass 08/18/2014 - INVASIVE ADENOCARCINOMA IN A BACKGROUND OF TUBULOVILLOUS ADENOMA. ADDITIONAL INFORMATION: Mismatch Repair (MMR) Protein Immunohistochemistry (IHC) IHC Expression Result (LIMITED TUMOR): MLH1: Preserved nuclear expression (greater 50% tumor expression) MSH2: Preserved nuclear expression (greater 50% tumor expression) MSH6: Preserved nuclear expression (greater 50% tumor expression) PMS2: Preserved nuclear expression (greater 50% tumor expression) * Internal control demonstrates intact nuclear expression Interpretation: NORMAL There is preserved expression  Diagnosis 08/28/2014  Liver, needle/core biopsy, right lobe - METASTATIC ADENOCARCINOMA       RADIOGRAPHIC STUDIES: I have personally reviewed the radiological images as listed and agreed with the findings in the report.   CT abdomen and pelvis with contrast 03/19/2016 IMPRESSION: 1. New colonic wall  thickening and surrounding inflammation most consistent with diffuse colitis. Consider pseudomembranous (Clostridium difficile) colitis. Other considerations include viral infection and drug reaction. 2. No evidence of bowel obstruction, perforation or abscess. 3. Interval improvement in multifocal hepatic metastatic disease. 4. Stable nodularity at both lung bases. 5. Cholelithiasis and atherosclerosis again noted.    ASSESSMENT & PLAN:  70 year old gentleman with past medical history of diabetes, hypertension, who presents with anemia and weakness.  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  -Unfortunately he has stage IV disease with diffuse liver metastasis, this is an incurable disease, with overall very poor prognosis. -I reviewed his staging CT scan results from 11/26/2015 with patient and his wife, Unfortunately scan showed disease progression in the liver, his tumor marker CA has been trending up lately, which is consistent with disease progression. -I previously discussed his guardant 360 number testing results, which showed mutations in KRAS, RAF 1, APC, TP 53,  Targeted therapy is nt available at this point. -I discussed and reviewed his restaging CT images from 01/28/2016 which showed disease progression in liver and lungs. I recommend him to stop gemcitabine -His tumor marker CEA has been trending up, corresponding to his disease progression -I have checked the clinical trial options at [facility] and [facility], unfortunately there is no suitable trials for him now.  -He received 2 cycles of Xeloda, but unfortunately developed moderate to severe side effects, and I stop it. -His recent CT scan showed partial response to Xeloda, I recommend him to switch to 5-fu, starting tomorrow. He is agreeable. Potential  side effects were discussed with patient and his wife again, he voiced good understanding.  2. Microcytic anemia secondary to GI bleeding, iron deficiency and chemotherapy -His serum iron level and saturation were low, although ferritin is normal, he has some degree of iron deficient anemia, and anemia of malignancy  -Consider blood transfusion if hemoglobin less than 7.5 or symptomatic anemia -he received IV feraheme 510 mg twice in 09/2014, repeated  iron study was normal on 07/23/2015 -His anemia got worse after he started gemcitabine, required blood transfusion -I encouraged him to take a multivitamin with minerals  3.  Hypertension and DM  -He will continue follow-up with his primary care physician. -continue close monitoring   4. Peripheral neuropathy, secondary to chemotherapy, G1-2 -His peripheral neuropathy, worse after Xeloda, especially with skin reactions, improving now. -Continue Neurontin  200 mg 3 times a day -We'll continue observation.   Plan: -Lab reviewed, adequate for treatment. He will start 5-FU infusion tomorrow, and continue every 2 weeks  -I will see him back in 2 weeks with lab and treatment   Truitt Merle  04/14/2016

## 2016-04-14 ENCOUNTER — Ambulatory Visit (HOSPITAL_BASED_OUTPATIENT_CLINIC_OR_DEPARTMENT_OTHER): Payer: Medicare Other | Admitting: Hematology

## 2016-04-14 ENCOUNTER — Telehealth: Payer: Self-pay | Admitting: *Deleted

## 2016-04-14 ENCOUNTER — Encounter: Payer: Self-pay | Admitting: Hematology

## 2016-04-14 ENCOUNTER — Other Ambulatory Visit (HOSPITAL_BASED_OUTPATIENT_CLINIC_OR_DEPARTMENT_OTHER): Payer: Medicare Other

## 2016-04-14 ENCOUNTER — Ambulatory Visit: Payer: Medicare Other

## 2016-04-14 ENCOUNTER — Telehealth: Payer: Self-pay | Admitting: Hematology

## 2016-04-14 VITALS — BP 109/62 | HR 60 | Temp 97.6°F | Resp 17 | Ht 66.0 in | Wt 171.4 lb

## 2016-04-14 DIAGNOSIS — D509 Iron deficiency anemia, unspecified: Secondary | ICD-10-CM

## 2016-04-14 DIAGNOSIS — I10 Essential (primary) hypertension: Secondary | ICD-10-CM

## 2016-04-14 DIAGNOSIS — C179 Malignant neoplasm of small intestine, unspecified: Secondary | ICD-10-CM

## 2016-04-14 DIAGNOSIS — C787 Secondary malignant neoplasm of liver and intrahepatic bile duct: Secondary | ICD-10-CM

## 2016-04-14 DIAGNOSIS — C782 Secondary malignant neoplasm of pleura: Secondary | ICD-10-CM | POA: Diagnosis not present

## 2016-04-14 DIAGNOSIS — D500 Iron deficiency anemia secondary to blood loss (chronic): Secondary | ICD-10-CM

## 2016-04-14 DIAGNOSIS — C772 Secondary and unspecified malignant neoplasm of intra-abdominal lymph nodes: Secondary | ICD-10-CM

## 2016-04-14 DIAGNOSIS — T451X5A Adverse effect of antineoplastic and immunosuppressive drugs, initial encounter: Secondary | ICD-10-CM

## 2016-04-14 DIAGNOSIS — D630 Anemia in neoplastic disease: Secondary | ICD-10-CM | POA: Diagnosis not present

## 2016-04-14 DIAGNOSIS — G620 Drug-induced polyneuropathy: Secondary | ICD-10-CM

## 2016-04-14 DIAGNOSIS — E119 Type 2 diabetes mellitus without complications: Secondary | ICD-10-CM

## 2016-04-14 DIAGNOSIS — Z95828 Presence of other vascular implants and grafts: Secondary | ICD-10-CM

## 2016-04-14 DIAGNOSIS — D6481 Anemia due to antineoplastic chemotherapy: Secondary | ICD-10-CM

## 2016-04-14 LAB — COMPREHENSIVE METABOLIC PANEL
ALBUMIN: 2.4 g/dL — AB (ref 3.5–5.0)
ALK PHOS: 88 U/L (ref 40–150)
ALT: 20 U/L (ref 0–55)
AST: 28 U/L (ref 5–34)
Anion Gap: 9 mEq/L (ref 3–11)
BUN: 11.8 mg/dL (ref 7.0–26.0)
CO2: 24 meq/L (ref 22–29)
Calcium: 8.6 mg/dL (ref 8.4–10.4)
Chloride: 108 mEq/L (ref 98–109)
Creatinine: 1 mg/dL (ref 0.7–1.3)
EGFR: 79 mL/min/{1.73_m2} — AB (ref >90)
GLUCOSE: 90 mg/dL (ref 70–140)
POTASSIUM: 3.8 meq/L (ref 3.5–5.1)
SODIUM: 141 meq/L (ref 136–145)
Total Bilirubin: 0.4 mg/dL (ref 0.20–1.20)
Total Protein: 5.7 g/dL — ABNORMAL LOW (ref 6.4–8.3)

## 2016-04-14 LAB — CBC WITH DIFFERENTIAL/PLATELET
BASO%: 0.3 % (ref 0.0–2.0)
BASOS ABS: 0 10*3/uL (ref 0.0–0.1)
EOS ABS: 0.2 10*3/uL (ref 0.0–0.5)
EOS%: 2.4 % (ref 0.0–7.0)
HCT: 28.7 % — ABNORMAL LOW (ref 38.4–49.9)
HGB: 9.3 g/dL — ABNORMAL LOW (ref 13.0–17.1)
LYMPH%: 25 % (ref 14.0–49.0)
MCH: 29.8 pg (ref 27.2–33.4)
MCHC: 32.4 g/dL (ref 32.0–36.0)
MCV: 92 fL (ref 79.3–98.0)
MONO#: 0.5 10*3/uL (ref 0.1–0.9)
MONO%: 8.4 % (ref 0.0–14.0)
NEUT#: 4 10*3/uL (ref 1.5–6.5)
NEUT%: 63.9 % (ref 39.0–75.0)
Platelets: 124 10*3/uL — ABNORMAL LOW (ref 140–400)
RBC: 3.12 10*6/uL — AB (ref 4.20–5.82)
RDW: 20.3 % — ABNORMAL HIGH (ref 11.0–14.6)
WBC: 6.3 10*3/uL (ref 4.0–10.3)
lymph#: 1.6 10*3/uL (ref 0.9–3.3)

## 2016-04-14 MED ORDER — HEPARIN SOD (PORK) LOCK FLUSH 100 UNIT/ML IV SOLN
500.0000 [IU] | Freq: Once | INTRAVENOUS | Status: AC | PRN
  Administered 2016-04-14: 500 [IU] via INTRAVENOUS
  Filled 2016-04-14: qty 5

## 2016-04-14 MED ORDER — SODIUM CHLORIDE 0.9 % IJ SOLN
10.0000 mL | INTRAMUSCULAR | Status: DC | PRN
  Administered 2016-04-14: 10 mL via INTRAVENOUS
  Filled 2016-04-14: qty 10

## 2016-04-14 NOTE — Telephone Encounter (Signed)
Per LOS I have scheduled appts and notified the schdeuler

## 2016-04-14 NOTE — Telephone Encounter (Deleted)
Per patient request I have moved appts to follow radaition

## 2016-04-14 NOTE — Telephone Encounter (Signed)
Message sent to chemo scheduler to be added. Appointments scheduled per 04/14/16 los. AVS report and appointment schedule given to patient per 04/14/16 los.

## 2016-04-15 ENCOUNTER — Ambulatory Visit (HOSPITAL_BASED_OUTPATIENT_CLINIC_OR_DEPARTMENT_OTHER): Payer: Medicare Other

## 2016-04-15 VITALS — BP 104/57 | HR 62 | Temp 98.5°F | Resp 16

## 2016-04-15 DIAGNOSIS — C179 Malignant neoplasm of small intestine, unspecified: Secondary | ICD-10-CM

## 2016-04-15 DIAGNOSIS — Z5111 Encounter for antineoplastic chemotherapy: Secondary | ICD-10-CM

## 2016-04-15 DIAGNOSIS — C787 Secondary malignant neoplasm of liver and intrahepatic bile duct: Secondary | ICD-10-CM | POA: Diagnosis not present

## 2016-04-15 DIAGNOSIS — C772 Secondary and unspecified malignant neoplasm of intra-abdominal lymph nodes: Secondary | ICD-10-CM | POA: Diagnosis not present

## 2016-04-15 MED ORDER — SODIUM CHLORIDE 0.9% FLUSH
10.0000 mL | INTRAVENOUS | Status: DC | PRN
  Filled 2016-04-15: qty 10

## 2016-04-15 MED ORDER — SODIUM CHLORIDE 0.9 % IV SOLN
Freq: Once | INTRAVENOUS | Status: AC
  Administered 2016-04-15: 08:00:00 via INTRAVENOUS

## 2016-04-15 MED ORDER — FLUOROURACIL CHEMO INJECTION 2.5 GM/50ML
400.0000 mg/m2 | Freq: Once | INTRAVENOUS | Status: AC
  Administered 2016-04-15: 750 mg via INTRAVENOUS
  Filled 2016-04-15: qty 15

## 2016-04-15 MED ORDER — PROCHLORPERAZINE MALEATE 10 MG PO TABS
ORAL_TABLET | ORAL | Status: AC
Start: 2016-04-15 — End: 2016-04-15
  Filled 2016-04-15: qty 1

## 2016-04-15 MED ORDER — PROCHLORPERAZINE MALEATE 10 MG PO TABS
10.0000 mg | ORAL_TABLET | Freq: Once | ORAL | Status: AC
  Administered 2016-04-15: 10 mg via ORAL

## 2016-04-15 MED ORDER — LEUCOVORIN CALCIUM INJECTION 350 MG
400.0000 mg/m2 | Freq: Once | INTRAMUSCULAR | Status: AC
  Administered 2016-04-15: 760 mg via INTRAVENOUS
  Filled 2016-04-15: qty 38

## 2016-04-15 MED ORDER — SODIUM CHLORIDE 0.9 % IV SOLN
2400.0000 mg/m2 | INTRAVENOUS | Status: DC
  Administered 2016-04-15: 4550 mg via INTRAVENOUS
  Filled 2016-04-15: qty 91

## 2016-04-15 MED ORDER — HEPARIN SOD (PORK) LOCK FLUSH 100 UNIT/ML IV SOLN
500.0000 [IU] | Freq: Once | INTRAVENOUS | Status: DC | PRN
  Filled 2016-04-15: qty 5

## 2016-04-15 NOTE — Patient Instructions (Signed)
[facility] Discharge Instructions for Patients Receiving Chemotherapy  Today you received the following chemotherapy agents: Leucovorin and Fluoraucil.  To help prevent nausea and vomiting after your treatment, we encourage you to take your nausea medication as prescribed. If you develop nausea and vomiting that is not controlled by your nausea medication, call the clinic.   BELOW ARE SYMPTOMS THAT SHOULD BE REPORTED IMMEDIATELY:  *FEVER GREATER THAN 100.5 F  *CHILLS WITH OR WITHOUT FEVER  NAUSEA AND VOMITING THAT IS NOT CONTROLLED WITH YOUR NAUSEA MEDICATION  *UNUSUAL SHORTNESS OF BREATH  *UNUSUAL BRUISING OR BLEEDING  TENDERNESS IN MOUTH AND THROAT WITH OR WITHOUT PRESENCE OF ULCERS  *URINARY PROBLEMS  *BOWEL PROBLEMS  UNUSUAL RASH Items with * indicate a potential emergency and should be followed up as soon as possible.  Feel free to call the clinic you have any questions or concerns. The clinic phone number is [phone].  Please show the [facility] at check-in to the Emergency Department and triage nurse.

## 2016-04-17 ENCOUNTER — Ambulatory Visit (HOSPITAL_BASED_OUTPATIENT_CLINIC_OR_DEPARTMENT_OTHER): Payer: Medicare Other

## 2016-04-17 VITALS — BP 137/65 | HR 63 | Temp 98.1°F | Resp 16

## 2016-04-17 DIAGNOSIS — C179 Malignant neoplasm of small intestine, unspecified: Secondary | ICD-10-CM

## 2016-04-17 DIAGNOSIS — C787 Secondary malignant neoplasm of liver and intrahepatic bile duct: Secondary | ICD-10-CM | POA: Diagnosis not present

## 2016-04-17 DIAGNOSIS — C772 Secondary and unspecified malignant neoplasm of intra-abdominal lymph nodes: Secondary | ICD-10-CM | POA: Diagnosis not present

## 2016-04-17 MED ORDER — SODIUM CHLORIDE 0.9% FLUSH
10.0000 mL | INTRAVENOUS | Status: DC | PRN
  Administered 2016-04-17: 10 mL
  Filled 2016-04-17: qty 10

## 2016-04-17 MED ORDER — HEPARIN SOD (PORK) LOCK FLUSH 100 UNIT/ML IV SOLN
500.0000 [IU] | Freq: Once | INTRAVENOUS | Status: AC | PRN
  Administered 2016-04-17: 500 [IU]
  Filled 2016-04-17: qty 5

## 2016-04-28 NOTE — Progress Notes (Signed)
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       08/18/2014 Miscellaneous    tumor KRAS mutation (-)      08/18/2014 Pathology Results    Liver biopsy showed metastatic adenocarcinoma, consistent with GI primary      08/18/2014 Initial Diagnosis    Small bowel cancer      08/18/2014 Procedure    small bowel enteroscopy with biopsy by Dr. Benson Norway showed at the proximal jejunum there was evidence of abnormal mucosa and friability, biopsy was obtained.       09/05/2014 Imaging    PET hypermetabolic mass involving a small bowel loop with adjacent mesenteric lymphadenopathy, and diffuse liver metastasis and mild hypermetabolic lymphadenopathy in porta hepatis.      09/06/2014 - 02/01/2015 Chemotherapy    mFOLFOX, stopped due to his neuropathy, and earlier mild disease progression on PET       09/07/2014 - 09/11/2014 Hospital Admission    He was admitted for fever and GI bleeding. Received 3 units of RBC.      10/26/2014 Imaging    Interval significant improvement in the primary small bowel mass, adjacent lymphadenopathy and extensive hepatic metastatic disease. No other new lesions.       02/09/2015 Imaging    PET/CT scan showed stable primary malignancy in the proximal jejunum, mild metabolic progression of the 3 residual metabolic liver metastasis. CT  Portion showed decreased size of his liver metastasis.      02/27/2015 - 11/06/2015 Chemotherapy    second line  chemo FOLFIRI, every 2 weeks, and panitumumab (held for second cycle due to severe skin rashes), chemotherapy stopped due to disease progression.      08/06/2015 Imaging    Stable number and size of the liver lesions. The left lower lobe nodule is considerably less prominent. No other new lesions.      11/26/2015 Progression    Restaging CT chest, abdomen and pelvis with contrast showed interval increase in size of liver lesions, multiple small pulmonary nodules are not significantly changed in the interval. Her tumor marker CEA also increased significantly      12/07/2015 - 01/16/2016 Chemotherapy    Gemcitabine 1000 mg/m2 (decreased to 800 mg/m from second dose), and a one and 8 every 21 days, stopped due to disease progression       02/12/2016 - 03/19/2016 Chemotherapy    Xeloda '1000mg'$ /m2 twice daily, 2 weeks on, one-week off, stopped after 2 cycles due to severe side effects (diarrhea, skin toxicities)      03/19/2016 - 03/21/2016 Hospital Admission    Patient was admitted for severe diarrhea, dehydration after second cycle of Xeloda, stoll c-diff was negative. He received IV fluids and supportive care.        HISTORY OF PRESENTING ILLNESS:  [name] 70 y.o. male is here because of a recent diagnosis of small bowel cancer. He presented to his PCP on 08/17/2014 with complaints  of feeling weak and a one month history of a cough. He was found to be anemic and was referred to the emergency department. Hemoglobin was found to be 7.6, MCV 76.6; creatinine was elevated at 1.74. Stool was Hemoccult positive.  CT abdomen/pelvis without contrast on 08/17/2014 showed multiple large masses within the liver, bulky in appearance. The largest measured approximately 5.7 cm. There appeared to be a focal filling defect within the proximal jejunum measuring approximately 2.5 x 1.7 cm. There was mild adjacent jejunal wall thickening. The lung bases were clear.  On 08/18/2014 he underwent a small  bowel enteroscopy with biopsy by Dr. Elnoria Howard. The esophagus and gastric lumen were normal. At the proximal jejunum there was evidence of abnormal mucosa and friability. The pediatric colonoscope was not able to traverse the area. Biopsies were obtained. An ultraslim colonoscope was then utilized. This colonoscope was able to traverse the area of stenosis which measured approximately 3 cm in length. 50% of the lumen was ulcerated. 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 MMR stains are pending.  He was discharged home on 08/19/2014.  CURRENT THERAPY:  5-fu, every 2 weeks   INTERIM HISTORY:  Mr. Breaker returns for follow-up. He is accompanied by his wife to the clinic today. He tolerated the 5-FU infusion very well 2 weeks ago, no significant nausea, diarrhea, or other noticeable side effects. His appetite and energy level has remained to be very well, he functions well at home. He denies any other new symptoms. His hand and foot syndrome has completely resolved, he still has mild numbness and tingling on his fingers and toes, but overall much better than a month ago. He has gained 5 pounds in the past 2 weeks.   This is a white MEDICAL HISTORY:  Past Medical History:  Diagnosis Date  . Diabetes mellitus without complication (HCC)   . Hypertension   . Small bowel cancer (HCC) 08/18/2014    SURGICAL HISTORY: Past Surgical History:  Procedure Laterality Date  . ENTEROSCOPY N/A 08/18/2014   Procedure: ENTEROSCOPY;  Surgeon: Jeani Hawking, MD;  Location: [facility];  Service: Endoscopy;  Laterality: N/A;    SOCIAL HISTORY: History   Social History  . Marital Status: Married    Spouse Name: N/A  . Number of Children: 2  . Years of Education: N/A   Occupational History  .  retired Financial risk analyst    Social History Main Topics  . Smoking status: Never Smoker   . Smokeless tobacco: Not on file  . Alcohol Use: No  . Drug Use: Not on file  . Sexual  Activity: Not on file   Other Topics Concern  . Not on file   Social History Narrative  He lives in [place]. He is married. He has 2 children both reported to be in good health. No tobacco or alcohol use. 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  FAMILY HISTORY: Family History  Problem Relation Age of Onset  . Heart failure, Alzheimer's  Mother   . Lung cancer Father   . Kidney cancer Brother     ALLERGIES:  is allergic to penicillins.  MEDICATIONS:  Current Outpatient Prescriptions on File Prior to Visit  Medication Sig Dispense Refill  . atorvastatin (LIPITOR) 20 MG tablet Take 20 mg by mouth every evening.     . diphenoxylate-atropine (LOMOTIL) 2.5-0.025 MG tablet Take 2 tablets by mouth 4 (four) times daily as needed for diarrhea or loose stools. 60 tablet 0  . gabapentin (NEURONTIN)  100 MG capsule Take 2 capsules (200 mg total) by mouth 3 (three) times daily. 180 capsule 1  . LANTUS SOLOSTAR 100 UNIT/ML Solostar Pen Inject 20 Units into the skin every evening.     . lidocaine-prilocaine (EMLA) cream Apply topically as needed. Apply to portacath 1 1/2 hours - 2 hours prior to procedures as needed. (Patient taking differently: Apply 1 application topically daily as needed (port access). Apply to portacath 1 1/2 hours - 2 hours prior to procedures) 30 g 2  . lisinopril (PRINIVIL,ZESTRIL) 10 MG tablet Take 10 mg by mouth every evening.   6  . loperamide (IMODIUM) 2 MG capsule Take 4 mg by mouth 4 (four) times daily as needed for diarrhea or loose stools.    . potassium chloride SA (K-DUR,KLOR-CON) 20 MEQ tablet Take 1 tab twice a day for 2 days then take 1 tab daily (Patient taking differently: Take 20 mEq by mouth daily. ) 30 tablet 1  . tamsulosin (FLOMAX) 0.4 MG CAPS capsule Take 0.4 mg by mouth every evening.   12   No current facility-administered medications on file prior to visit.   ;  REVIEW OF SYSTEMS:   Constitutional: Denies fevers, chills or abnormal night sweats; mild  fatigue, weight is stable.  Eyes: Denies blurriness of vision, double vision or watery eyes Ears, nose, mouth, throat, and face: Denies mucositis or sore throat Respiratory: One month history of a nonproductive cough;  no shortness of breath Cardiovascular: Denies palpitation, chest discomfort or lower extremity swelling Gastrointestinal:  Denies nausea, heartburn. No dysphagia. He has noted less frequent bowel movements over the past 2 weeks. Does not characterize this as constipation. Skin: Denies abnormal skin rashes Lymphatics: Denies new lymphadenopathy or easy bruising Neurological: Denies numbness, tingling. No extremity weakness Behavioral/Psych: Mood is stable, no new changes  All other systems were reviewed with the patient and are negative.  PHYSICAL EXAMINATION: ECOG PERFORMANCE STATUS: 1  Vitals:   04/29/16 0830  BP: (!) 146/60  Pulse: 60  Resp: 18  Temp: 97.6 F (36.4 C)   Filed Weights   04/29/16 0830  Weight: 176 lb 3.2 oz (79.9 kg)    GENERAL:alert, no distress and comfortable SKIN: skin color, texture, turgor are normal, (+) skin pigmentation On his face, and hands, from her recent Xeloda. Skin peeling on palms has much improved.   EYES: normal, conjunctiva are pink and non-injected, sclera clear OROPHARYNX:no exudate, no erythema and lips, buccal mucosa, and tongue normal  NECK: supple, thyroid normal size, non-tender, without nodularity LYMPH:  no palpable lymphadenopathy in the cervical, axillary or inguinal regions LUNGS: clear to auscultation and percussion with normal breathing effort HEART: regular rate & rhythm and no murmurs and no lower extremity edema ABDOMEN:abdomen soft, mild tenderness at the right upper quadrant , no rebound pain, normal bowel sounds Musculoskeletal:no cyanosis of digits and no clubbing  PSYCH: alert & oriented x 3 with fluent speech NEURO: no focal motor deficits, his light touch sensation and vibration sensation are diminished  on his hands and feet.   LABORATORY DATA:  I have reviewed the data as listed CBC Latest Ref Rng & Units 04/29/2016 04/14/2016 03/31/2016  WBC 4.0 - 10.3 10e3/uL 3.7(L) 6.3 7.8  Hemoglobin 13.0 - 17.1 g/dL 9.2(L) 9.3(L) 11.6(L)  Hematocrit 38.4 - 49.9 % 28.8(L) 28.7(L) 34.8(L)  Platelets 140 - 400 10e3/uL 110(L) 124(L) 163    CMP Latest Ref Rng & Units 04/14/2016 03/31/2016 03/21/2016  Glucose 70 - 140 mg/dl 90 203(H) 139(H)  BUN 7.0 - 26.0 mg/dL 11.8 17.1 11  Creatinine 0.7 - 1.3 mg/dL 1.0 1.2 1.07  Sodium 136 - 145 mEq/L 141 141 138  Potassium 3.5 - 5.1 mEq/L 3.8 3.0(LL) 3.3(L)  Chloride 101 - 111 mmol/L - - 111  CO2 22 - 29 mEq/L '24 24 24  '$ Calcium 8.4 - 10.4 mg/dL 8.6 8.2(L) 7.8(L)  Total Protein 6.4 - 8.3 g/dL 5.7(L) 5.7(L) 4.8(L)  Total Bilirubin 0.20 - 1.20 mg/dL 0.40 1.07 1.1  Alkaline Phos 40 - 150 U/L 88 78 43  AST 5 - 34 U/L '28 27 16  '$ ALT 0 - 55 U/L 20 19 9(L)   CEA: 08/31/2014: 189 12/12/2014: 9.0 03/27/2015: 5.0 05/29/2015: 17.6 07/09/2015: 41 11/26/2015: 153 01/30/2016: 523 02/29/2016: 985 03/31/2016: 295  Pathology report:  Small Intestine Biopsy, jejunal mass 08/18/2014 - INVASIVE ADENOCARCINOMA IN A BACKGROUND OF TUBULOVILLOUS ADENOMA. ADDITIONAL INFORMATION: Mismatch Repair (MMR) Protein Immunohistochemistry (IHC) IHC Expression Result (LIMITED TUMOR): MLH1: Preserved nuclear expression (greater 50% tumor expression) MSH2: Preserved nuclear expression (greater 50% tumor expression) MSH6: Preserved nuclear expression (greater 50% tumor expression) PMS2: Preserved nuclear expression (greater 50% tumor expression) * Internal control demonstrates intact nuclear expression Interpretation: NORMAL There is preserved expression  Diagnosis 08/28/2014  Liver, needle/core biopsy, right lobe - METASTATIC ADENOCARCINOMA       RADIOGRAPHIC STUDIES: I have personally reviewed the radiological images as listed and agreed with the findings in the report.   CT  abdomen and pelvis with contrast 03/19/2016 IMPRESSION: 1. New colonic wall thickening and surrounding inflammation most consistent with diffuse colitis. Consider pseudomembranous (Clostridium difficile) colitis. Other considerations include viral infection and drug reaction. 2. No evidence of bowel obstruction, perforation or abscess. 3. Interval improvement in multifocal hepatic metastatic disease. 4. Stable nodularity at both lung bases. 5. Cholelithiasis and atherosclerosis again noted.    ASSESSMENT & PLAN:  70 year old gentleman with past medical history of diabetes, hypertension, who presents with anemia and weakness.  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  -Unfortunately he has stage IV disease with diffuse liver metastasis, this is an incurable disease, with overall very poor prognosis. -I reviewed his staging CT scan results from 11/26/2015 with patient and his wife, Unfortunately scan showed disease progression in the liver, his tumor marker CA has been trending up lately, which is consistent with disease progression. -I previously discussed his guardant 360 number testing results, which showed mutations in KRAS, RAF 1, APC, TP 53,  Targeted therapy is nt available at this point. -I discussed and reviewed his restaging CT images from 01/28/2016 which showed disease progression in liver and lungs. I recommend him to stop gemcitabine -His tumor marker CEA has been trending up, corresponding to his disease progression -I have checked the clinical trial options at [facility] and  [facility], unfortunately there is no suitable trials for him now.  -He received 2 cycles of Xeloda, but unfortunately developed moderate to severe side effects, and I stop it. -His recent CT scan showed partial response to Xeloda, I  recommend him to switch to 5-fu, he is tolerating well, we'll continue. -Lab results reviewed, he has mild pancytopenia from chemotherapy, adequate for treatment, we'll proceed with 5-FU today  2. Microcytic anemia secondary to GI bleeding, iron deficiency and chemotherapy -His serum iron level and saturation were low, although ferritin is normal, he has some degree of iron deficient anemia,  and anemia of malignancy  -Consider blood transfusion if hemoglobin less than 7.5 or symptomatic anemia -he received IV feraheme 510 mg twice in 09/2014, repeated iron study was normal on 07/23/2015 -His anemia got worse after he started gemcitabine, required blood transfusion -I encouraged him to take a multivitamin with minerals  3.  Hypertension and DM  -He will continue follow-up with his primary care physician. -He has been off lisinopril lately due to borderline low blood pressure, his blood pressure is elevated today, he will continue monitoring at home, and restart lisinopril if needed -continue close monitoring   4. Peripheral neuropathy, secondary to chemotherapy, G1 -His peripheral neuropathy, worse after Xeloda, especially with skin reactions, much improving now. -Continue Neurontin  200 mg 3 times a day -We'll continue observation.   Plan: -Lab reviewed, adequate for treatment. He will start 5-FU infusion today, and continue every 2 weeks  -I will see him back in 4 weeks with lab and treatment   Truitt Merle  04/29/2016

## 2016-04-29 ENCOUNTER — Ambulatory Visit: Payer: Medicare Other

## 2016-04-29 ENCOUNTER — Telehealth: Payer: Self-pay | Admitting: Hematology

## 2016-04-29 ENCOUNTER — Ambulatory Visit (HOSPITAL_BASED_OUTPATIENT_CLINIC_OR_DEPARTMENT_OTHER): Payer: Medicare Other | Admitting: Hematology

## 2016-04-29 ENCOUNTER — Telehealth: Payer: Self-pay | Admitting: *Deleted

## 2016-04-29 ENCOUNTER — Encounter: Payer: Self-pay | Admitting: Hematology

## 2016-04-29 ENCOUNTER — Other Ambulatory Visit (HOSPITAL_BASED_OUTPATIENT_CLINIC_OR_DEPARTMENT_OTHER): Payer: Medicare Other

## 2016-04-29 ENCOUNTER — Ambulatory Visit (HOSPITAL_BASED_OUTPATIENT_CLINIC_OR_DEPARTMENT_OTHER): Payer: Medicare Other

## 2016-04-29 VITALS — BP 146/60 | HR 60 | Temp 97.6°F | Resp 18 | Ht 66.0 in | Wt 176.2 lb

## 2016-04-29 DIAGNOSIS — C779 Secondary and unspecified malignant neoplasm of lymph node, unspecified: Secondary | ICD-10-CM

## 2016-04-29 DIAGNOSIS — C787 Secondary malignant neoplasm of liver and intrahepatic bile duct: Secondary | ICD-10-CM | POA: Diagnosis not present

## 2016-04-29 DIAGNOSIS — I10 Essential (primary) hypertension: Secondary | ICD-10-CM

## 2016-04-29 DIAGNOSIS — Z5111 Encounter for antineoplastic chemotherapy: Secondary | ICD-10-CM | POA: Diagnosis not present

## 2016-04-29 DIAGNOSIS — D630 Anemia in neoplastic disease: Secondary | ICD-10-CM

## 2016-04-29 DIAGNOSIS — Z95828 Presence of other vascular implants and grafts: Secondary | ICD-10-CM

## 2016-04-29 DIAGNOSIS — C179 Malignant neoplasm of small intestine, unspecified: Secondary | ICD-10-CM

## 2016-04-29 DIAGNOSIS — E119 Type 2 diabetes mellitus without complications: Secondary | ICD-10-CM

## 2016-04-29 DIAGNOSIS — D509 Iron deficiency anemia, unspecified: Secondary | ICD-10-CM

## 2016-04-29 DIAGNOSIS — G620 Drug-induced polyneuropathy: Secondary | ICD-10-CM

## 2016-04-29 DIAGNOSIS — D6481 Anemia due to antineoplastic chemotherapy: Secondary | ICD-10-CM

## 2016-04-29 DIAGNOSIS — D500 Iron deficiency anemia secondary to blood loss (chronic): Secondary | ICD-10-CM

## 2016-04-29 DIAGNOSIS — T451X5A Adverse effect of antineoplastic and immunosuppressive drugs, initial encounter: Secondary | ICD-10-CM

## 2016-04-29 LAB — CBC WITH DIFFERENTIAL/PLATELET
BASO%: 0.5 % (ref 0.0–2.0)
Basophils Absolute: 0 10*3/uL (ref 0.0–0.1)
EOS%: 4.2 % (ref 0.0–7.0)
Eosinophils Absolute: 0.2 10*3/uL (ref 0.0–0.5)
HEMATOCRIT: 28.8 % — AB (ref 38.4–49.9)
HGB: 9.2 g/dL — ABNORMAL LOW (ref 13.0–17.1)
LYMPH#: 1 10*3/uL (ref 0.9–3.3)
LYMPH%: 28.3 % (ref 14.0–49.0)
MCH: 30 pg (ref 27.2–33.4)
MCHC: 31.9 g/dL — AB (ref 32.0–36.0)
MCV: 94 fL (ref 79.3–98.0)
MONO#: 0.5 10*3/uL (ref 0.1–0.9)
MONO%: 14.4 % — ABNORMAL HIGH (ref 0.0–14.0)
NEUT#: 1.9 10*3/uL (ref 1.5–6.5)
NEUT%: 52.6 % (ref 39.0–75.0)
Platelets: 110 10*3/uL — ABNORMAL LOW (ref 140–400)
RBC: 3.07 10*6/uL — AB (ref 4.20–5.82)
RDW: 21.6 % — ABNORMAL HIGH (ref 11.0–14.6)
WBC: 3.7 10*3/uL — ABNORMAL LOW (ref 4.0–10.3)

## 2016-04-29 LAB — COMPREHENSIVE METABOLIC PANEL
ALT: 43 U/L (ref 0–55)
ANION GAP: 6 meq/L (ref 3–11)
AST: 63 U/L — ABNORMAL HIGH (ref 5–34)
Albumin: 2.8 g/dL — ABNORMAL LOW (ref 3.5–5.0)
Alkaline Phosphatase: 112 U/L (ref 40–150)
BILIRUBIN TOTAL: 0.48 mg/dL (ref 0.20–1.20)
BUN: 10.8 mg/dL (ref 7.0–26.0)
CALCIUM: 8.8 mg/dL (ref 8.4–10.4)
CO2: 26 mEq/L (ref 22–29)
CREATININE: 0.9 mg/dL (ref 0.7–1.3)
Chloride: 108 mEq/L (ref 98–109)
EGFR: 80 mL/min/{1.73_m2} — AB (ref >90)
Glucose: 155 mg/dl — ABNORMAL HIGH (ref 70–140)
Potassium: 3.7 mEq/L (ref 3.5–5.1)
Sodium: 140 mEq/L (ref 136–145)
TOTAL PROTEIN: 6.1 g/dL — AB (ref 6.4–8.3)

## 2016-04-29 LAB — CEA (IN HOUSE-CHCC): CEA (CHCC-IN HOUSE): 244.69 ng/mL — AB (ref 0.00–5.00)

## 2016-04-29 MED ORDER — FLUOROURACIL CHEMO INJECTION 5 GM/100ML
2400.0000 mg/m2 | INTRAVENOUS | Status: DC
  Administered 2016-04-29: 4550 mg via INTRAVENOUS
  Filled 2016-04-29: qty 91

## 2016-04-29 MED ORDER — SODIUM CHLORIDE 0.9% FLUSH
10.0000 mL | INTRAVENOUS | Status: DC | PRN
  Administered 2016-04-29: 10 mL
  Filled 2016-04-29: qty 10

## 2016-04-29 MED ORDER — SODIUM CHLORIDE 0.9 % IV SOLN
400.0000 mg/m2 | Freq: Once | INTRAVENOUS | Status: AC
  Administered 2016-04-29: 760 mg via INTRAVENOUS
  Filled 2016-04-29: qty 38

## 2016-04-29 MED ORDER — SODIUM CHLORIDE 0.9 % IJ SOLN
10.0000 mL | INTRAMUSCULAR | Status: DC | PRN
  Administered 2016-04-29: 10 mL via INTRAVENOUS
  Filled 2016-04-29: qty 10

## 2016-04-29 MED ORDER — PROCHLORPERAZINE MALEATE 10 MG PO TABS
10.0000 mg | ORAL_TABLET | Freq: Once | ORAL | Status: AC
  Administered 2016-04-29: 10 mg via ORAL

## 2016-04-29 MED ORDER — PROCHLORPERAZINE MALEATE 10 MG PO TABS
ORAL_TABLET | ORAL | Status: AC
Start: 2016-04-29 — End: 2016-04-29
  Filled 2016-04-29: qty 1

## 2016-04-29 MED ORDER — HEPARIN SOD (PORK) LOCK FLUSH 100 UNIT/ML IV SOLN
500.0000 [IU] | Freq: Once | INTRAVENOUS | Status: AC | PRN
  Administered 2016-04-29: 500 [IU]
  Filled 2016-04-29: qty 5

## 2016-04-29 MED ORDER — FLUOROURACIL CHEMO INJECTION 2.5 GM/50ML
400.0000 mg/m2 | Freq: Once | INTRAVENOUS | Status: AC
  Administered 2016-04-29: 750 mg via INTRAVENOUS
  Filled 2016-04-29: qty 15

## 2016-04-29 MED ORDER — SODIUM CHLORIDE 0.9 % IV SOLN
Freq: Once | INTRAVENOUS | Status: AC
  Administered 2016-04-29: 10:00:00 via INTRAVENOUS

## 2016-04-29 NOTE — Telephone Encounter (Signed)
Message sent to chemo scheduler to be added. Appointments scheduled per 04/29/16 los. A copy of the AVS report and appointment schedule was given to the patient, per 04/29/16 los.

## 2016-04-29 NOTE — Patient Instructions (Signed)
[facility] Discharge Instructions for Patients Receiving Chemotherapy  Today you received the following chemotherapy agents: Leucovorin and Fluoraucil.  To help prevent nausea and vomiting after your treatment, we encourage you to take your nausea medication as prescribed. If you develop nausea and vomiting that is not controlled by your nausea medication, call the clinic.   BELOW ARE SYMPTOMS THAT SHOULD BE REPORTED IMMEDIATELY:  *FEVER GREATER THAN 100.5 F  *CHILLS WITH OR WITHOUT FEVER  NAUSEA AND VOMITING THAT IS NOT CONTROLLED WITH YOUR NAUSEA MEDICATION  *UNUSUAL SHORTNESS OF BREATH  *UNUSUAL BRUISING OR BLEEDING  TENDERNESS IN MOUTH AND THROAT WITH OR WITHOUT PRESENCE OF ULCERS  *URINARY PROBLEMS  *BOWEL PROBLEMS  UNUSUAL RASH Items with * indicate a potential emergency and should be followed up as soon as possible.  Feel free to call the clinic you have any questions or concerns. The clinic phone number is [phone].  Please show the [place] at check-in to the Emergency Department and triage nurse.

## 2016-04-29 NOTE — Telephone Encounter (Signed)
Per LOS I have scheduled appts and notified the scheduler 

## 2016-05-01 ENCOUNTER — Ambulatory Visit (HOSPITAL_BASED_OUTPATIENT_CLINIC_OR_DEPARTMENT_OTHER): Payer: Medicare Other

## 2016-05-01 VITALS — BP 147/57 | HR 71 | Temp 98.3°F | Resp 18

## 2016-05-01 DIAGNOSIS — C787 Secondary malignant neoplasm of liver and intrahepatic bile duct: Secondary | ICD-10-CM | POA: Diagnosis not present

## 2016-05-01 DIAGNOSIS — C779 Secondary and unspecified malignant neoplasm of lymph node, unspecified: Secondary | ICD-10-CM

## 2016-05-01 DIAGNOSIS — C179 Malignant neoplasm of small intestine, unspecified: Secondary | ICD-10-CM

## 2016-05-01 MED ORDER — HEPARIN SOD (PORK) LOCK FLUSH 100 UNIT/ML IV SOLN
500.0000 [IU] | Freq: Once | INTRAVENOUS | Status: AC | PRN
  Administered 2016-05-01: 500 [IU]
  Filled 2016-05-01: qty 5

## 2016-05-01 MED ORDER — SODIUM CHLORIDE 0.9% FLUSH
10.0000 mL | INTRAVENOUS | Status: DC | PRN
  Administered 2016-05-01: 10 mL
  Filled 2016-05-01: qty 10

## 2016-05-13 ENCOUNTER — Ambulatory Visit: Payer: Medicare Other | Admitting: Hematology

## 2016-05-13 ENCOUNTER — Other Ambulatory Visit (HOSPITAL_BASED_OUTPATIENT_CLINIC_OR_DEPARTMENT_OTHER): Payer: Medicare Other

## 2016-05-13 ENCOUNTER — Ambulatory Visit (HOSPITAL_BASED_OUTPATIENT_CLINIC_OR_DEPARTMENT_OTHER): Payer: Medicare Other

## 2016-05-13 ENCOUNTER — Ambulatory Visit: Payer: Medicare Other

## 2016-05-13 VITALS — BP 109/61 | HR 57 | Temp 97.9°F | Resp 18

## 2016-05-13 DIAGNOSIS — C179 Malignant neoplasm of small intestine, unspecified: Secondary | ICD-10-CM

## 2016-05-13 DIAGNOSIS — C779 Secondary and unspecified malignant neoplasm of lymph node, unspecified: Secondary | ICD-10-CM

## 2016-05-13 DIAGNOSIS — Z5111 Encounter for antineoplastic chemotherapy: Secondary | ICD-10-CM

## 2016-05-13 DIAGNOSIS — C787 Secondary malignant neoplasm of liver and intrahepatic bile duct: Secondary | ICD-10-CM

## 2016-05-13 LAB — COMPREHENSIVE METABOLIC PANEL
ALT: 20 U/L (ref 0–55)
ANION GAP: 10 meq/L (ref 3–11)
AST: 24 U/L (ref 5–34)
Albumin: 3 g/dL — ABNORMAL LOW (ref 3.5–5.0)
Alkaline Phosphatase: 95 U/L (ref 40–150)
BUN: 15.4 mg/dL (ref 7.0–26.0)
CO2: 25 meq/L (ref 22–29)
CREATININE: 0.9 mg/dL (ref 0.7–1.3)
Calcium: 8.9 mg/dL (ref 8.4–10.4)
Chloride: 108 mEq/L (ref 98–109)
EGFR: 81 mL/min/{1.73_m2} — ABNORMAL LOW (ref >90)
GLUCOSE: 92 mg/dL (ref 70–140)
Potassium: 3.6 mEq/L (ref 3.5–5.1)
Sodium: 143 mEq/L (ref 136–145)
TOTAL PROTEIN: 6.5 g/dL (ref 6.4–8.3)
Total Bilirubin: 0.42 mg/dL (ref 0.20–1.20)

## 2016-05-13 LAB — CBC WITH DIFFERENTIAL/PLATELET
BASO%: 0.4 % (ref 0.0–2.0)
Basophils Absolute: 0 10*3/uL (ref 0.0–0.1)
EOS ABS: 0 10*3/uL (ref 0.0–0.5)
EOS%: 0.9 % (ref 0.0–7.0)
HCT: 30.8 % — ABNORMAL LOW (ref 38.4–49.9)
HGB: 9.7 g/dL — ABNORMAL LOW (ref 13.0–17.1)
LYMPH%: 26.3 % (ref 14.0–49.0)
MCH: 30 pg (ref 27.2–33.4)
MCHC: 31.5 g/dL — AB (ref 32.0–36.0)
MCV: 95.4 fL (ref 79.3–98.0)
MONO#: 0.7 10*3/uL (ref 0.1–0.9)
MONO%: 15.7 % — ABNORMAL HIGH (ref 0.0–14.0)
NEUT#: 2.6 10*3/uL (ref 1.5–6.5)
NEUT%: 56.7 % (ref 39.0–75.0)
Platelets: 109 10*3/uL — ABNORMAL LOW (ref 140–400)
RBC: 3.22 10*6/uL — AB (ref 4.20–5.82)
RDW: 20.2 % — AB (ref 11.0–14.6)
WBC: 4.6 10*3/uL (ref 4.0–10.3)
lymph#: 1.2 10*3/uL (ref 0.9–3.3)

## 2016-05-13 MED ORDER — LEUCOVORIN CALCIUM INJECTION 350 MG
400.0000 mg/m2 | Freq: Once | INTRAVENOUS | Status: AC
  Administered 2016-05-13: 760 mg via INTRAVENOUS
  Filled 2016-05-13: qty 38

## 2016-05-13 MED ORDER — FLUOROURACIL CHEMO INJECTION 5 GM/100ML
2400.0000 mg/m2 | INTRAVENOUS | Status: DC
  Administered 2016-05-13: 4550 mg via INTRAVENOUS
  Filled 2016-05-13: qty 91

## 2016-05-13 MED ORDER — SODIUM CHLORIDE 0.9 % IV SOLN
Freq: Once | INTRAVENOUS | Status: AC
Start: 2016-05-13 — End: 2016-05-13
  Administered 2016-05-13: 09:00:00 via INTRAVENOUS

## 2016-05-13 MED ORDER — FLUOROURACIL CHEMO INJECTION 2.5 GM/50ML
400.0000 mg/m2 | Freq: Once | INTRAVENOUS | Status: AC
  Administered 2016-05-13: 750 mg via INTRAVENOUS
  Filled 2016-05-13: qty 15

## 2016-05-13 MED ORDER — PROCHLORPERAZINE MALEATE 10 MG PO TABS
ORAL_TABLET | ORAL | Status: AC
  Filled 2016-05-13: qty 1

## 2016-05-13 MED ORDER — PROCHLORPERAZINE MALEATE 10 MG PO TABS
10.0000 mg | ORAL_TABLET | Freq: Once | ORAL | Status: AC
  Administered 2016-05-13: 10 mg via ORAL

## 2016-05-13 NOTE — Patient Instructions (Signed)
[facility] Discharge Instructions for Patients Receiving Chemotherapy  Today you received the following chemotherapy agents: Leucovorin and Fluoraucil.  To help prevent nausea and vomiting after your treatment, we encourage you to take your nausea medication as prescribed. If you develop nausea and vomiting that is not controlled by your nausea medication, call the clinic.   BELOW ARE SYMPTOMS THAT SHOULD BE REPORTED IMMEDIATELY:  *FEVER GREATER THAN 100.5 F  *CHILLS WITH OR WITHOUT FEVER  NAUSEA AND VOMITING THAT IS NOT CONTROLLED WITH YOUR NAUSEA MEDICATION  *UNUSUAL SHORTNESS OF BREATH  *UNUSUAL BRUISING OR BLEEDING  TENDERNESS IN MOUTH AND THROAT WITH OR WITHOUT PRESENCE OF ULCERS  *URINARY PROBLEMS  *BOWEL PROBLEMS  UNUSUAL RASH Items with * indicate a potential emergency and should be followed up as soon as possible.  Feel free to call the clinic you have any questions or concerns. The clinic phone number is [phone].  Please show the [place] at check-in to the Emergency Department and triage nurse.

## 2016-05-13 NOTE — Patient Instructions (Signed)

## 2016-05-15 ENCOUNTER — Ambulatory Visit (HOSPITAL_BASED_OUTPATIENT_CLINIC_OR_DEPARTMENT_OTHER): Payer: Medicare Other

## 2016-05-15 DIAGNOSIS — C787 Secondary malignant neoplasm of liver and intrahepatic bile duct: Secondary | ICD-10-CM

## 2016-05-15 DIAGNOSIS — C779 Secondary and unspecified malignant neoplasm of lymph node, unspecified: Secondary | ICD-10-CM

## 2016-05-15 DIAGNOSIS — Z452 Encounter for adjustment and management of vascular access device: Secondary | ICD-10-CM | POA: Diagnosis not present

## 2016-05-15 DIAGNOSIS — C179 Malignant neoplasm of small intestine, unspecified: Secondary | ICD-10-CM

## 2016-05-15 MED ORDER — SODIUM CHLORIDE 0.9% FLUSH
10.0000 mL | INTRAVENOUS | Status: DC | PRN
  Administered 2016-05-15: 10 mL
  Filled 2016-05-15: qty 10

## 2016-05-15 MED ORDER — HEPARIN SOD (PORK) LOCK FLUSH 100 UNIT/ML IV SOLN
500.0000 [IU] | Freq: Once | INTRAVENOUS | Status: AC | PRN
  Administered 2016-05-15: 500 [IU]
  Filled 2016-05-15: qty 5

## 2016-05-27 ENCOUNTER — Other Ambulatory Visit: Payer: Self-pay | Admitting: Nurse Practitioner

## 2016-05-27 ENCOUNTER — Other Ambulatory Visit (HOSPITAL_BASED_OUTPATIENT_CLINIC_OR_DEPARTMENT_OTHER): Payer: Medicare Other

## 2016-05-27 ENCOUNTER — Encounter: Payer: Self-pay | Admitting: Pharmacist

## 2016-05-27 ENCOUNTER — Ambulatory Visit (HOSPITAL_BASED_OUTPATIENT_CLINIC_OR_DEPARTMENT_OTHER): Payer: Medicare Other

## 2016-05-27 ENCOUNTER — Ambulatory Visit (HOSPITAL_BASED_OUTPATIENT_CLINIC_OR_DEPARTMENT_OTHER): Payer: Medicare Other | Admitting: Hematology

## 2016-05-27 ENCOUNTER — Telehealth: Payer: Self-pay | Admitting: Hematology

## 2016-05-27 ENCOUNTER — Encounter: Payer: Self-pay | Admitting: *Deleted

## 2016-05-27 ENCOUNTER — Encounter: Payer: Self-pay | Admitting: Hematology

## 2016-05-27 VITALS — BP 128/47 | HR 63 | Temp 97.7°F | Resp 18 | Ht 66.0 in | Wt 177.2 lb

## 2016-05-27 DIAGNOSIS — Z5111 Encounter for antineoplastic chemotherapy: Secondary | ICD-10-CM | POA: Diagnosis not present

## 2016-05-27 DIAGNOSIS — C779 Secondary and unspecified malignant neoplasm of lymph node, unspecified: Secondary | ICD-10-CM | POA: Diagnosis not present

## 2016-05-27 DIAGNOSIS — Z95828 Presence of other vascular implants and grafts: Secondary | ICD-10-CM

## 2016-05-27 DIAGNOSIS — D500 Iron deficiency anemia secondary to blood loss (chronic): Secondary | ICD-10-CM | POA: Diagnosis not present

## 2016-05-27 DIAGNOSIS — T451X5A Adverse effect of antineoplastic and immunosuppressive drugs, initial encounter: Secondary | ICD-10-CM

## 2016-05-27 DIAGNOSIS — C179 Malignant neoplasm of small intestine, unspecified: Secondary | ICD-10-CM

## 2016-05-27 DIAGNOSIS — C787 Secondary malignant neoplasm of liver and intrahepatic bile duct: Secondary | ICD-10-CM | POA: Diagnosis not present

## 2016-05-27 DIAGNOSIS — G620 Drug-induced polyneuropathy: Secondary | ICD-10-CM

## 2016-05-27 DIAGNOSIS — K529 Noninfective gastroenteritis and colitis, unspecified: Secondary | ICD-10-CM

## 2016-05-27 DIAGNOSIS — D630 Anemia in neoplastic disease: Secondary | ICD-10-CM

## 2016-05-27 DIAGNOSIS — E119 Type 2 diabetes mellitus without complications: Secondary | ICD-10-CM

## 2016-05-27 LAB — CBC WITH DIFFERENTIAL/PLATELET
BASO%: 0.3 % (ref 0.0–2.0)
BASOS ABS: 0 10*3/uL (ref 0.0–0.1)
EOS%: 1.1 % (ref 0.0–7.0)
Eosinophils Absolute: 0 10*3/uL (ref 0.0–0.5)
HEMATOCRIT: 29.5 % — AB (ref 38.4–49.9)
HGB: 9.6 g/dL — ABNORMAL LOW (ref 13.0–17.1)
LYMPH#: 1.3 10*3/uL (ref 0.9–3.3)
LYMPH%: 30.8 % (ref 14.0–49.0)
MCH: 31 pg (ref 27.2–33.4)
MCHC: 32.5 g/dL (ref 32.0–36.0)
MCV: 95.5 fL (ref 79.3–98.0)
MONO#: 0.6 10*3/uL (ref 0.1–0.9)
MONO%: 14.1 % — ABNORMAL HIGH (ref 0.0–14.0)
NEUT#: 2.3 10*3/uL (ref 1.5–6.5)
NEUT%: 53.7 % (ref 39.0–75.0)
Platelets: 96 10*3/uL — ABNORMAL LOW (ref 140–400)
RBC: 3.09 10*6/uL — ABNORMAL LOW (ref 4.20–5.82)
RDW: 18.6 % — AB (ref 11.0–14.6)
WBC: 4.4 10*3/uL (ref 4.0–10.3)

## 2016-05-27 LAB — CEA (IN HOUSE-CHCC): CEA (CHCC-IN HOUSE): 347.91 ng/mL — AB (ref 0.00–5.00)

## 2016-05-27 LAB — COMPREHENSIVE METABOLIC PANEL
ALBUMIN: 3 g/dL — AB (ref 3.5–5.0)
ALK PHOS: 80 U/L (ref 40–150)
ALT: 16 U/L (ref 0–55)
AST: 24 U/L (ref 5–34)
Anion Gap: 7 mEq/L (ref 3–11)
BUN: 11 mg/dL (ref 7.0–26.0)
CALCIUM: 8.6 mg/dL (ref 8.4–10.4)
CO2: 26 mEq/L (ref 22–29)
CREATININE: 0.9 mg/dL (ref 0.7–1.3)
Chloride: 108 mEq/L (ref 98–109)
EGFR: 83 mL/min/{1.73_m2} — ABNORMAL LOW (ref >90)
Glucose: 107 mg/dl (ref 70–140)
Potassium: 3.8 mEq/L (ref 3.5–5.1)
Sodium: 141 mEq/L (ref 136–145)
Total Bilirubin: 0.45 mg/dL (ref 0.20–1.20)
Total Protein: 6.2 g/dL — ABNORMAL LOW (ref 6.4–8.3)

## 2016-05-27 MED ORDER — SODIUM CHLORIDE 0.9 % IV SOLN
2400.0000 mg/m2 | INTRAVENOUS | Status: DC
  Administered 2016-05-27: 4550 mg via INTRAVENOUS
  Filled 2016-05-27: qty 91

## 2016-05-27 MED ORDER — DEXTROSE 5 % IV SOLN
400.0000 mg/m2 | Freq: Once | INTRAVENOUS | Status: AC
  Administered 2016-05-27: 760 mg via INTRAVENOUS
  Filled 2016-05-27: qty 38

## 2016-05-27 MED ORDER — HEPARIN SOD (PORK) LOCK FLUSH 100 UNIT/ML IV SOLN
500.0000 [IU] | Freq: Once | INTRAVENOUS | Status: DC | PRN
  Filled 2016-05-27: qty 5

## 2016-05-27 MED ORDER — FLUOROURACIL CHEMO INJECTION 2.5 GM/50ML
400.0000 mg/m2 | Freq: Once | INTRAVENOUS | Status: AC
  Administered 2016-05-27: 750 mg via INTRAVENOUS
  Filled 2016-05-27: qty 15

## 2016-05-27 MED ORDER — SODIUM CHLORIDE 0.9% FLUSH
10.0000 mL | INTRAVENOUS | Status: DC | PRN
  Filled 2016-05-27: qty 10

## 2016-05-27 MED ORDER — PROCHLORPERAZINE MALEATE 10 MG PO TABS
ORAL_TABLET | ORAL | Status: AC
  Filled 2016-05-27: qty 1

## 2016-05-27 MED ORDER — SODIUM CHLORIDE 0.9 % IV SOLN
Freq: Once | INTRAVENOUS | Status: AC
  Administered 2016-05-27: 10:00:00 via INTRAVENOUS

## 2016-05-27 MED ORDER — SODIUM CHLORIDE 0.9 % IJ SOLN
10.0000 mL | INTRAMUSCULAR | Status: DC | PRN
  Administered 2016-05-27: 10 mL via INTRAVENOUS
  Filled 2016-05-27: qty 10

## 2016-05-27 MED ORDER — PROCHLORPERAZINE MALEATE 10 MG PO TABS
10.0000 mg | ORAL_TABLET | Freq: Once | ORAL | Status: AC
  Administered 2016-05-27: 10 mg via ORAL

## 2016-05-27 NOTE — Patient Instructions (Signed)
[facility] Discharge Instructions for Patients Receiving Chemotherapy  Today you received the following chemotherapy agents: Leucovorin and Fluoraucil.  To help prevent nausea and vomiting after your treatment, we encourage you to take your nausea medication as prescribed. If you develop nausea and vomiting that is not controlled by your nausea medication, call the clinic.   BELOW ARE SYMPTOMS THAT SHOULD BE REPORTED IMMEDIATELY:  *FEVER GREATER THAN 100.5 F  *CHILLS WITH OR WITHOUT FEVER  NAUSEA AND VOMITING THAT IS NOT CONTROLLED WITH YOUR NAUSEA MEDICATION  *UNUSUAL SHORTNESS OF BREATH  *UNUSUAL BRUISING OR BLEEDING  TENDERNESS IN MOUTH AND THROAT WITH OR WITHOUT PRESENCE OF ULCERS  *URINARY PROBLEMS  *BOWEL PROBLEMS  UNUSUAL RASH Items with * indicate a potential emergency and should be followed up as soon as possible.  Feel free to call the clinic you have any questions or concerns. The clinic phone number is [phone].  Please show the [place] at check-in to the Emergency Department and triage nurse.

## 2016-05-27 NOTE — Progress Notes (Signed)
Oncology Nurse Navigator Documentation  Oncology Nurse Navigator Flowsheets 05/27/2016  Navigator Location [facility]  Navigator Encounter Type Follow-up Appt  Patient Visit Type MedOnc  Treatment Phase Active Tx--5FU pump  Barriers/Navigation Needs No barriers at this time;No Questions;No Needs  Education -  Interventions None required  Education Method -  Support Groups/Services -  Acuity Level 1  Time Spent with Patient -  Time Spent with Patient (Retired) -

## 2016-05-27 NOTE — Telephone Encounter (Signed)
Lab, flush, follow up and Chemo, scheduled per 05/27/16 los.  Patient was given a copy of the appointment schedule and AVS report, per 05/27/16 los.

## 2016-05-27 NOTE — Progress Notes (Signed)
Pt saw Dr. Burr Medico today prior to chemo.  OK to treat with platelet 96 as per md.

## 2016-05-27 NOTE — Progress Notes (Signed)
Ok to tx with plt = 96,000 per Dr Burr Medico.

## 2016-05-27 NOTE — Progress Notes (Signed)
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       08/18/2014 Miscellaneous    tumor KRAS mutation (-)      08/18/2014 Pathology Results    Liver biopsy showed metastatic adenocarcinoma, consistent with GI primary      08/18/2014 Initial Diagnosis    Small bowel cancer      08/18/2014 Procedure    small bowel enteroscopy with biopsy by Dr. Benson Norway showed at the proximal jejunum there was evidence of abnormal mucosa and friability, biopsy was obtained.       09/05/2014 Imaging    PET hypermetabolic mass involving a small bowel loop with adjacent mesenteric lymphadenopathy, and diffuse liver metastasis and mild hypermetabolic lymphadenopathy in porta hepatis.      09/06/2014 - 02/01/2015 Chemotherapy    mFOLFOX, stopped due to his neuropathy, and earlier mild disease progression on PET       09/07/2014 - 09/11/2014 Hospital Admission    He was admitted for fever and GI bleeding. Received 3 units of RBC.      10/26/2014 Imaging    Interval significant improvement in the primary small bowel mass, adjacent lymphadenopathy and extensive hepatic metastatic disease. No other new lesions.       02/09/2015 Imaging    PET/CT scan showed stable primary malignancy in the proximal jejunum, mild metabolic progression of the 3 residual metabolic liver metastasis. CT  Portion showed decreased size of his liver metastasis.      02/27/2015 - 11/06/2015 Chemotherapy    second line  chemo FOLFIRI, every 2 weeks, and panitumumab (held for second cycle due to severe skin rashes), chemotherapy stopped due to disease progression.      08/06/2015 Imaging    Stable number and size of the liver lesions. The left lower lobe nodule is considerably less prominent. No other new lesions.      11/26/2015 Progression    Restaging CT chest, abdomen and pelvis with contrast showed interval increase in size of liver lesions, multiple small pulmonary nodules are not significantly changed in the interval. Her tumor marker CEA also increased significantly      12/07/2015 - 01/16/2016 Chemotherapy    Gemcitabine 1000 mg/m2 (decreased to 800 mg/m from second dose), and a one and 8 every 21 days, stopped due to disease progression       02/12/2016 - 03/19/2016 Chemotherapy    Xeloda '1000mg'$ /m2 twice daily, 2 weeks on, one-week off, stopped after 2 cycles due to severe side effects (diarrhea, skin toxicities)      03/19/2016 - 03/21/2016 Hospital Admission    Patient was admitted for severe diarrhea, dehydration after second cycle of Xeloda, stoll c-diff was negative. He received IV fluids and supportive care.        HISTORY OF PRESENTING ILLNESS:  Tyler Evans 70 y.o. male is here because of a recent diagnosis of small bowel cancer. He presented to his PCP on 08/17/2014 with complaints  of feeling weak and a one month history of a cough. He was found to be anemic and was referred to the emergency department. Hemoglobin was found to be 7.6, MCV 76.6; creatinine was elevated at 1.74. Stool was Hemoccult positive.  CT abdomen/pelvis without contrast on 08/17/2014 showed multiple large masses within the liver, bulky in appearance. The largest measured approximately 5.7 cm. There appeared to be a focal filling defect within the proximal jejunum measuring approximately 2.5 x 1.7 cm. There was mild adjacent jejunal wall thickening. The lung bases were clear.  On 08/18/2014 he underwent a small  bowel enteroscopy with biopsy by Dr. Benson Norway. The esophagus and gastric lumen were normal. At the proximal jejunum there was evidence of abnormal mucosa and friability. The pediatric colonoscope was not able to traverse the area. Biopsies were obtained. An ultraslim colonoscope was then utilized. This colonoscope was able to traverse the area of stenosis which measured approximately 3 cm in length. 50% of the lumen was ulcerated. 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 MMR stains are pending.  He was discharged home on 08/19/2014.  CURRENT THERAPY:  5-fu, every 2 weeks   INTERIM HISTORY:  Tyler Evans returns for follow-up. He is doing well overall, denies any significant pain, or other discomfort. His appetite and energy level are decent, he functions very well at home. He has been tolerating chemotherapy well, no significant side effects.  This is a white MEDICAL HISTORY:  Past Medical History:  Diagnosis Date  . Diabetes mellitus without complication ([place])   . Hypertension   . Small bowel cancer ([place]) 08/18/2014    SURGICAL HISTORY: Past Surgical History:  Procedure Laterality Date  . ENTEROSCOPY N/A 08/18/2014   Procedure: ENTEROSCOPY;  Surgeon: Carol Ada, MD;  Location: [facility];  Service: Endoscopy;  Laterality: N/A;    SOCIAL HISTORY: History   Social History  . Marital Status: Married    Spouse Name: N/A  . Number of Children: 2  . Years of Education: N/A   Occupational History  .  retired Ambulance person    Social History Main Topics  . Smoking status: Never Smoker   . Smokeless tobacco: Not on file  . Alcohol Use: No  . Drug Use: Not on file  . Sexual Activity: Not on file   Other Topics Concern  . Not on file   Social History Narrative  He lives in [place]. He is married. He has 2 children both reported to be in good health. No tobacco or alcohol use. He is a retired Ambulance person.  FAMILY HISTORY: Family  History  Problem Relation Age of Onset  . Heart failure, Alzheimer's  Mother   . Lung cancer Father   . Kidney cancer Brother     ALLERGIES:  is allergic to penicillins.  MEDICATIONS:  Current Outpatient Prescriptions on File Prior to Visit  Medication Sig Dispense Refill  . atorvastatin (LIPITOR) 20 MG tablet Take 20 mg by mouth every evening.     . diphenoxylate-atropine (LOMOTIL) 2.5-0.025 MG tablet Take 2 tablets by mouth 4 (four) times daily as needed for diarrhea or loose stools. 60 tablet 0  . gabapentin (NEURONTIN) 100 MG capsule Take 2 capsules (200 mg total) by mouth 3 (three) times daily. 180 capsule 1  . LANTUS SOLOSTAR 100 UNIT/ML Solostar Pen Inject 20 Units into the skin every evening.     . lidocaine-prilocaine (EMLA) cream Apply topically as needed. Apply to portacath 1 1/2 hours - 2 hours  prior to procedures as needed. (Patient taking differently: Apply 1 application topically daily as needed (port access). Apply to portacath 1 1/2 hours - 2 hours prior to procedures) 30 g 2  . lisinopril (PRINIVIL,ZESTRIL) 10 MG tablet Take 10 mg by mouth every evening.   6  . loperamide (IMODIUM) 2 MG capsule Take 4 mg by mouth 4 (four) times daily as needed for diarrhea or loose stools.    . tamsulosin (FLOMAX) 0.4 MG CAPS capsule Take 0.4 mg by mouth every evening.   12   No current facility-administered medications on file prior to visit.   ;  REVIEW OF SYSTEMS:   Constitutional: Denies fevers, chills or abnormal night sweats; mild fatigue, weight is stable.  Eyes: Denies blurriness of vision, double vision or watery eyes Ears, nose, mouth, throat, and face: Denies mucositis or sore throat Respiratory: One month history of a nonproductive cough;  no shortness of breath Cardiovascular: Denies palpitation, chest discomfort or lower extremity swelling Gastrointestinal:  Denies nausea, heartburn. No dysphagia. He has noted less frequent bowel movements over the past 2 weeks. Does not  characterize this as constipation. Skin: Denies abnormal skin rashes Lymphatics: Denies new lymphadenopathy or easy bruising Neurological: Denies numbness, tingling. No extremity weakness Behavioral/Psych: Mood is stable, no new changes  All other systems were reviewed with the patient and are negative.  PHYSICAL EXAMINATION: ECOG PERFORMANCE STATUS: 1  Vitals:   05/27/16 0909  BP: (!) 128/47  Pulse: 63  Resp: 18  Temp: 97.7 F (36.5 C)   Filed Weights   05/27/16 0909  Weight: 177 lb 3.2 oz (80.4 kg)    GENERAL:alert, no distress and comfortable SKIN: skin color, texture, turgor are normal, (+) skin pigmentation On his face, and hands, from her recent Xeloda. Skin peeling on palms has much improved.   EYES: normal, conjunctiva are pink and non-injected, sclera clear OROPHARYNX:no exudate, no erythema and lips, buccal mucosa, and tongue normal  NECK: supple, thyroid normal size, non-tender, without nodularity LYMPH:  no palpable lymphadenopathy in the cervical, axillary or inguinal regions LUNGS: clear to auscultation and percussion with normal breathing effort HEART: regular rate & rhythm and no murmurs and no lower extremity edema ABDOMEN:abdomen soft, mild tenderness at the right upper quadrant , no rebound pain, normal bowel sounds Musculoskeletal:no cyanosis of digits and no clubbing  PSYCH: alert & oriented x 3 with fluent speech NEURO: no focal motor deficits, his light touch sensation and vibration sensation are diminished on his hands and feet.   LABORATORY DATA:  I have reviewed the data as listed CBC Latest Ref Rng & Units 05/27/2016 05/13/2016 04/29/2016  WBC 4.0 - 10.3 10e3/uL 4.4 4.6 3.7(L)  Hemoglobin 13.0 - 17.1 g/dL 9.6(L) 9.7(L) 9.2(L)  Hematocrit 38.4 - 49.9 % 29.5(L) 30.8(L) 28.8(L)  Platelets 140 - 400 10e3/uL 96(L) 109(L) 110(L)    CMP Latest Ref Rng & Units 05/27/2016 05/13/2016 04/29/2016  Glucose 70 - 140 mg/dl 107 92 155(H)  BUN 7.0 - 26.0 mg/dL  11.0 15.4 10.8  Creatinine 0.7 - 1.3 mg/dL 0.9 0.9 0.9  Sodium 136 - 145 mEq/L 141 143 140  Potassium 3.5 - 5.1 mEq/L 3.8 3.6 3.7  Chloride 101 - 111 mmol/L - - -  CO2 22 - 29 mEq/L '26 25 26  '$ Calcium 8.4 - 10.4 mg/dL 8.6 8.9 8.8  Total Protein 6.4 - 8.3 g/dL 6.2(L) 6.5 6.1(L)  Total Bilirubin 0.20 - 1.20 mg/dL 0.45 0.42 0.48  Alkaline Phos 40 - 150 U/L 80  95 112  AST 5 - 34 U/L 24 24 63(H)  ALT 0 - 55 U/L 16 20 43   CEA: 08/31/2014: 189 12/12/2014: 9.0 03/27/2015: 5.0 05/29/2015: 17.6 07/09/2015: 41 11/26/2015: 153 01/30/2016: 523 02/29/2016: 985 03/31/2016: 295 04/29/2016: 244.69 05/27/2016: 347  Pathology report:  Small Intestine Biopsy, jejunal mass 08/18/2014 - INVASIVE ADENOCARCINOMA IN A BACKGROUND OF TUBULOVILLOUS ADENOMA. ADDITIONAL INFORMATION: Mismatch Repair (MMR) Protein Immunohistochemistry (IHC) IHC Expression Result (LIMITED TUMOR): MLH1: Preserved nuclear expression (greater 50% tumor expression) MSH2: Preserved nuclear expression (greater 50% tumor expression) MSH6: Preserved nuclear expression (greater 50% tumor expression) PMS2: Preserved nuclear expression (greater 50% tumor expression) * Internal control demonstrates intact nuclear expression Interpretation: NORMAL There is preserved expression  Diagnosis 08/28/2014  Liver, needle/core biopsy, right lobe - METASTATIC ADENOCARCINOMA       RADIOGRAPHIC STUDIES: I have personally reviewed the radiological images as listed and agreed with the findings in the report.   CT abdomen and pelvis with contrast 03/19/2016 IMPRESSION: 1. New colonic wall thickening and surrounding inflammation most consistent with diffuse colitis. Consider pseudomembranous (Clostridium difficile) colitis. Other considerations include viral infection and drug reaction. 2. No evidence of bowel obstruction, perforation or abscess. 3. Interval improvement in multifocal hepatic metastatic disease. 4. Stable nodularity at both  lung bases. 5. Cholelithiasis and atherosclerosis again noted.    ASSESSMENT & PLAN:  70 y.o. gentleman with past medical history of diabetes, hypertension, who presents with anemia and weakness.  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  -Unfortunately he has stage IV disease with diffuse liver metastasis, this is an incurable disease, with overall very poor prognosis. -I reviewed his staging CT scan results from 11/26/2015 with patient and his wife, Unfortunately scan showed disease progression in the liver, his tumor marker CA has been trending up lately, which is consistent with disease progression. -I previously discussed his guardant 360 number testing results, which showed mutations in KRAS, RAF 1, APC, TP 53,  Targeted therapy is nt available at this point. -I discussed and reviewed his restaging CT images from 01/28/2016 which showed disease progression in liver and lungs. I recommend him to stop gemcitabine -His tumor marker CEA has been trending up, corresponding to his disease progression -I have checked the clinical trial options at [facility] and [place], unfortunately there is no suitable trials for him now.  -He received 2 cycles of Xeloda, but unfortunately developed moderate to severe side effects, and I stopped it. -His recent CT scan showed partial response to Xeloda, I recommend him to switch to 5-fu, he is tolerating well, we'll continue. -Lab results reviewed, he has mild pancytopenia from chemotherapy, adequate for treatment, we'll proceed with 5-FU today and continue every 2 weeks -will repeat staging scan next month   2. Microcytic anemia secondary to GI bleeding, iron deficiency and chemotherapy -His serum iron level and saturation were low, although ferritin is normal, he has some degree of  iron deficient anemia, and anemia of malignancy  -Consider blood transfusion if hemoglobin less than 7.5 or symptomatic anemia -he received IV feraheme 510 mg twice in 09/2014, repeated iron study was normal on 07/23/2015 -His anemia got worse after he started gemcitabine, required blood transfusion -I encouraged him to take a multivitamin with minerals  3.  Hypertension and DM  -He will continue follow-up with his primary care physician. -He has been off lisinopril  lately due to borderline low blood pressure, his blood pressure is elevated today, he will continue monitoring at home, and restart lisinopril if needed -continue close monitoring   4. Peripheral neuropathy, secondary to chemotherapy, G1 -His peripheral neuropathy, worse after Xeloda, especially with skin reactions, much improving now. -Continue Neurontin  200 mg 3 times a day -We'll continue observation.   Plan: -Lab reviewed, adequate for treatment. He will proceed with 5-FU infusion today, and continue every 2 weeks  -I will see him back in 4 weeks with lab and treatment, restaging CT CAP with contrast one week before   Truitt Merle  05/27/2016

## 2016-05-29 ENCOUNTER — Ambulatory Visit (HOSPITAL_BASED_OUTPATIENT_CLINIC_OR_DEPARTMENT_OTHER): Payer: Medicare Other

## 2016-05-29 VITALS — BP 120/57 | HR 71 | Temp 98.7°F | Resp 20

## 2016-05-29 DIAGNOSIS — Z452 Encounter for adjustment and management of vascular access device: Secondary | ICD-10-CM | POA: Diagnosis not present

## 2016-05-29 DIAGNOSIS — C787 Secondary malignant neoplasm of liver and intrahepatic bile duct: Secondary | ICD-10-CM | POA: Diagnosis not present

## 2016-05-29 DIAGNOSIS — C179 Malignant neoplasm of small intestine, unspecified: Secondary | ICD-10-CM

## 2016-05-29 MED ORDER — SODIUM CHLORIDE 0.9% FLUSH
10.0000 mL | INTRAVENOUS | Status: DC | PRN
  Administered 2016-05-29: 10 mL
  Filled 2016-05-29: qty 10

## 2016-05-29 MED ORDER — HEPARIN SOD (PORK) LOCK FLUSH 100 UNIT/ML IV SOLN
500.0000 [IU] | Freq: Once | INTRAVENOUS | Status: AC | PRN
  Administered 2016-05-29: 500 [IU]
  Filled 2016-05-29: qty 5

## 2016-05-29 NOTE — Patient Instructions (Signed)

## 2016-06-10 ENCOUNTER — Ambulatory Visit (HOSPITAL_BASED_OUTPATIENT_CLINIC_OR_DEPARTMENT_OTHER): Payer: Medicare Other

## 2016-06-10 ENCOUNTER — Other Ambulatory Visit: Payer: Self-pay | Admitting: Hematology

## 2016-06-10 ENCOUNTER — Other Ambulatory Visit (HOSPITAL_BASED_OUTPATIENT_CLINIC_OR_DEPARTMENT_OTHER): Payer: Medicare Other

## 2016-06-10 ENCOUNTER — Ambulatory Visit: Payer: Medicare Other

## 2016-06-10 VITALS — BP 128/58 | HR 60 | Temp 97.7°F | Resp 18

## 2016-06-10 VITALS — BP 125/61 | HR 67 | Temp 98.2°F | Resp 18

## 2016-06-10 DIAGNOSIS — Z95828 Presence of other vascular implants and grafts: Secondary | ICD-10-CM

## 2016-06-10 DIAGNOSIS — Z5111 Encounter for antineoplastic chemotherapy: Secondary | ICD-10-CM

## 2016-06-10 DIAGNOSIS — C787 Secondary malignant neoplasm of liver and intrahepatic bile duct: Secondary | ICD-10-CM | POA: Diagnosis not present

## 2016-06-10 DIAGNOSIS — C179 Malignant neoplasm of small intestine, unspecified: Secondary | ICD-10-CM | POA: Diagnosis not present

## 2016-06-10 LAB — CBC WITH DIFFERENTIAL/PLATELET
BASO%: 0.2 % (ref 0.0–2.0)
BASOS ABS: 0 10*3/uL (ref 0.0–0.1)
EOS%: 1.4 % (ref 0.0–7.0)
Eosinophils Absolute: 0.1 10*3/uL (ref 0.0–0.5)
HEMATOCRIT: 30.5 % — AB (ref 38.4–49.9)
HGB: 9.9 g/dL — ABNORMAL LOW (ref 13.0–17.1)
LYMPH%: 27.6 % (ref 14.0–49.0)
MCH: 30.7 pg (ref 27.2–33.4)
MCHC: 32.5 g/dL (ref 32.0–36.0)
MCV: 94.4 fL (ref 79.3–98.0)
MONO#: 0.5 10*3/uL (ref 0.1–0.9)
MONO%: 10.7 % (ref 0.0–14.0)
NEUT%: 60.1 % (ref 39.0–75.0)
NEUTROS ABS: 3 10*3/uL (ref 1.5–6.5)
PLATELETS: 88 10*3/uL — AB (ref 140–400)
RBC: 3.23 10*6/uL — AB (ref 4.20–5.82)
RDW: 16.6 % — AB (ref 11.0–14.6)
WBC: 5 10*3/uL (ref 4.0–10.3)
lymph#: 1.4 10*3/uL (ref 0.9–3.3)

## 2016-06-10 LAB — COMPREHENSIVE METABOLIC PANEL
ALBUMIN: 3.2 g/dL — AB (ref 3.5–5.0)
ALK PHOS: 80 U/L (ref 40–150)
ALT: 14 U/L (ref 0–55)
ANION GAP: 8 meq/L (ref 3–11)
AST: 22 U/L (ref 5–34)
BILIRUBIN TOTAL: 0.5 mg/dL (ref 0.20–1.20)
BUN: 13.2 mg/dL (ref 7.0–26.0)
CALCIUM: 9.1 mg/dL (ref 8.4–10.4)
CO2: 26 mEq/L (ref 22–29)
CREATININE: 1 mg/dL (ref 0.7–1.3)
Chloride: 107 mEq/L (ref 98–109)
EGFR: 79 mL/min/{1.73_m2} — AB (ref >90)
Glucose: 129 mg/dl (ref 70–140)
Potassium: 3.8 mEq/L (ref 3.5–5.1)
Sodium: 141 mEq/L (ref 136–145)
TOTAL PROTEIN: 6.4 g/dL (ref 6.4–8.3)

## 2016-06-10 MED ORDER — PROCHLORPERAZINE MALEATE 10 MG PO TABS
ORAL_TABLET | ORAL | Status: AC
  Filled 2016-06-10: qty 1

## 2016-06-10 MED ORDER — SODIUM CHLORIDE 0.9 % IJ SOLN
10.0000 mL | INTRAMUSCULAR | Status: DC | PRN
  Administered 2016-06-10: 10 mL via INTRAVENOUS
  Filled 2016-06-10: qty 10

## 2016-06-10 MED ORDER — HEPARIN SOD (PORK) LOCK FLUSH 100 UNIT/ML IV SOLN
500.0000 [IU] | Freq: Once | INTRAVENOUS | Status: DC | PRN
  Filled 2016-06-10: qty 5

## 2016-06-10 MED ORDER — SODIUM CHLORIDE 0.9 % IV SOLN
Freq: Once | INTRAVENOUS | Status: AC
  Administered 2016-06-10: 10:00:00 via INTRAVENOUS

## 2016-06-10 MED ORDER — LEUCOVORIN CALCIUM INJECTION 350 MG
400.0000 mg/m2 | Freq: Once | INTRAMUSCULAR | Status: AC
  Administered 2016-06-10: 760 mg via INTRAVENOUS
  Filled 2016-06-10: qty 38

## 2016-06-10 MED ORDER — PROCHLORPERAZINE MALEATE 10 MG PO TABS
10.0000 mg | ORAL_TABLET | Freq: Once | ORAL | Status: AC
  Administered 2016-06-10: 10 mg via ORAL

## 2016-06-10 MED ORDER — SODIUM CHLORIDE 0.9 % IV SOLN
2000.0000 mg/m2 | INTRAVENOUS | Status: DC
  Administered 2016-06-10: 3800 mg via INTRAVENOUS
  Filled 2016-06-10: qty 76

## 2016-06-10 NOTE — Progress Notes (Signed)
Ok to treat with platelets of 88 per Dr.Feng. Will dose reduce today.

## 2016-06-10 NOTE — Patient Instructions (Signed)
[place] Discharge Instructions for Patients Receiving Chemotherapy  Today you received the following chemotherapy agents: Leucovorin and Fluoraucil.  To help prevent nausea and vomiting after your treatment, we encourage you to take your nausea medication as prescribed. If you develop nausea and vomiting that is not controlled by your nausea medication, call the clinic.   BELOW ARE SYMPTOMS THAT SHOULD BE REPORTED IMMEDIATELY:  *FEVER GREATER THAN 100.5 F  *CHILLS WITH OR WITHOUT FEVER  NAUSEA AND VOMITING THAT IS NOT CONTROLLED WITH YOUR NAUSEA MEDICATION  *UNUSUAL SHORTNESS OF BREATH  *UNUSUAL BRUISING OR BLEEDING  TENDERNESS IN MOUTH AND THROAT WITH OR WITHOUT PRESENCE OF ULCERS  *URINARY PROBLEMS  *BOWEL PROBLEMS  UNUSUAL RASH Items with * indicate a potential emergency and should be followed up as soon as possible.  Feel free to call the clinic you have any questions or concerns. The clinic phone number is [phone].  Please show the [place] at check-in to the Emergency Department and triage nurse.

## 2016-06-12 ENCOUNTER — Ambulatory Visit (HOSPITAL_BASED_OUTPATIENT_CLINIC_OR_DEPARTMENT_OTHER): Payer: Medicare Other

## 2016-06-12 VITALS — BP 139/63 | HR 72 | Temp 97.9°F | Resp 18

## 2016-06-12 DIAGNOSIS — C179 Malignant neoplasm of small intestine, unspecified: Secondary | ICD-10-CM | POA: Diagnosis not present

## 2016-06-12 DIAGNOSIS — Z452 Encounter for adjustment and management of vascular access device: Secondary | ICD-10-CM | POA: Diagnosis not present

## 2016-06-12 MED ORDER — HEPARIN SOD (PORK) LOCK FLUSH 100 UNIT/ML IV SOLN
500.0000 [IU] | Freq: Once | INTRAVENOUS | Status: AC | PRN
  Administered 2016-06-12: 500 [IU]
  Filled 2016-06-12: qty 5

## 2016-06-12 MED ORDER — SODIUM CHLORIDE 0.9% FLUSH
10.0000 mL | INTRAVENOUS | Status: DC | PRN
  Administered 2016-06-12: 10 mL
  Filled 2016-06-12: qty 10

## 2016-06-12 NOTE — Patient Instructions (Signed)

## 2016-06-17 ENCOUNTER — Encounter (HOSPITAL_COMMUNITY): Payer: Self-pay

## 2016-06-17 ENCOUNTER — Ambulatory Visit (HOSPITAL_COMMUNITY)
Admission: RE | Admit: 2016-06-17 | Discharge: 2016-06-17 | Disposition: A | Discharge status: Home / Self Care | Payer: Medicare Other | Source: Ambulatory Visit | Attending: Hematology | Admitting: Hematology

## 2016-06-17 DIAGNOSIS — C179 Malignant neoplasm of small intestine, unspecified: Secondary | ICD-10-CM | POA: Insufficient documentation

## 2016-06-17 DIAGNOSIS — E119 Type 2 diabetes mellitus without complications: Secondary | ICD-10-CM | POA: Diagnosis not present

## 2016-06-17 DIAGNOSIS — C787 Secondary malignant neoplasm of liver and intrahepatic bile duct: Secondary | ICD-10-CM | POA: Insufficient documentation

## 2016-06-17 DIAGNOSIS — I700 Atherosclerosis of aorta: Secondary | ICD-10-CM | POA: Insufficient documentation

## 2016-06-17 DIAGNOSIS — C7802 Secondary malignant neoplasm of left lung: Secondary | ICD-10-CM | POA: Insufficient documentation

## 2016-06-17 DIAGNOSIS — K8020 Calculus of gallbladder without cholecystitis without obstruction: Secondary | ICD-10-CM | POA: Diagnosis not present

## 2016-06-17 DIAGNOSIS — D630 Anemia in neoplastic disease: Secondary | ICD-10-CM | POA: Insufficient documentation

## 2016-06-17 DIAGNOSIS — N400 Enlarged prostate without lower urinary tract symptoms: Secondary | ICD-10-CM | POA: Diagnosis not present

## 2016-06-17 DIAGNOSIS — T451X5A Adverse effect of antineoplastic and immunosuppressive drugs, initial encounter: Secondary | ICD-10-CM | POA: Insufficient documentation

## 2016-06-17 DIAGNOSIS — G620 Drug-induced polyneuropathy: Secondary | ICD-10-CM

## 2016-06-17 DIAGNOSIS — C7801 Secondary malignant neoplasm of right lung: Secondary | ICD-10-CM | POA: Diagnosis not present

## 2016-06-17 MED ORDER — IOPAMIDOL (ISOVUE-300) INJECTION 61%
INTRAVENOUS | Status: AC
  Filled 2016-06-17: qty 100

## 2016-06-17 MED ORDER — IOPAMIDOL (ISOVUE-300) INJECTION 61%
100.0000 mL | Freq: Once | INTRAVENOUS | Status: AC | PRN
  Administered 2016-06-17: 100 mL via INTRAVENOUS

## 2016-06-23 NOTE — Progress Notes (Signed)
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       08/18/2014 Miscellaneous    tumor KRAS mutation (-)      08/18/2014 Pathology Results    Liver biopsy showed metastatic adenocarcinoma, consistent with GI primary      08/18/2014 Initial Diagnosis    Small bowel cancer      08/18/2014 Procedure    small bowel enteroscopy with biopsy by Dr. Benson Norway showed at the proximal jejunum there was evidence of abnormal mucosa and friability, biopsy was obtained.       09/05/2014 Imaging    PET hypermetabolic mass involving a small bowel loop with adjacent mesenteric lymphadenopathy, and diffuse liver metastasis and mild hypermetabolic lymphadenopathy in porta hepatis.      09/06/2014 - 02/01/2015 Chemotherapy    mFOLFOX, stopped due to his neuropathy, and earlier mild disease progression on PET       09/07/2014 - 09/11/2014 Hospital Admission    He was admitted for fever and GI bleeding. Received 3 units of RBC.      10/26/2014 Imaging    Interval significant improvement in the primary small bowel mass, adjacent lymphadenopathy and extensive hepatic metastatic disease. No other new lesions.       02/09/2015 Imaging    PET/CT scan showed stable primary malignancy in the proximal jejunum, mild metabolic progression of the 3 residual metabolic liver metastasis. CT  Portion showed decreased size of his liver metastasis.      02/27/2015 - 11/06/2015 Chemotherapy    second line  chemo FOLFIRI, every 2 weeks, and panitumumab (held for second cycle due to severe skin rashes), chemotherapy stopped due to disease progression.      08/06/2015 Imaging    Stable number and size of the liver lesions. The left lower lobe nodule is considerably less prominent. No other new lesions.      11/26/2015 Progression    Restaging CT chest, abdomen and pelvis with contrast showed interval increase in size of liver lesions, multiple small pulmonary nodules are not significantly changed in the interval. Her tumor marker CEA also increased significantly      12/07/2015 - 01/16/2016 Chemotherapy    Gemcitabine 1000 mg/m2 (decreased to 800 mg/m from second dose), and a one and 8 every 21 days, stopped due to disease progression       02/12/2016 - 03/19/2016 Chemotherapy    Xeloda '1000mg'$ /m2 twice daily, 2 weeks on, one-week off, stopped after 2 cycles due to severe side effects (diarrhea, skin toxicities)      03/19/2016 - 03/21/2016 Hospital Admission    Patient was admitted for severe diarrhea, dehydration after second cycle of Xeloda, stoll c-diff was negative. He received IV fluids and supportive care.      06/17/2016 Imaging    CT Chest, Abdomen, Pelvis w/ Contrast  IMPRESSION: Mild increase in size of several hepatic metastases.  Stable sub-cm bilateral pulmonary metastases.  Interval resolution of diffuse colitis  since prior exam.  Incidentally noted aortic atherosclerosis, cholelithiasis, and mildly enlarged prostate.        HISTORY OF PRESENTING ILLNESS:  Tyler Evans 71 y.o. male is here because of a recent diagnosis of small bowel cancer. He presented to his PCP on 08/17/2014 with complaints of feeling weak and a one month history of a cough. He was found to be anemic and was referred to the emergency department. Hemoglobin was found to be 7.6, MCV 76.6; creatinine was elevated at 1.74. Stool was Hemoccult positive.  CT abdomen/pelvis without contrast on  08/17/2014 showed multiple large masses within the liver, bulky in appearance. The largest measured approximately 5.7 cm. There appeared to be a focal filling defect within the proximal jejunum measuring approximately 2.5 x 1.7 cm. There was mild adjacent jejunal wall thickening. The lung bases were clear.  On 08/18/2014 he underwent a small bowel enteroscopy with biopsy by Dr. Benson Norway. The esophagus and gastric lumen were normal. At the proximal jejunum there was evidence of abnormal mucosa and friability. The pediatric colonoscope was not able to traverse the area. Biopsies were obtained. An ultraslim colonoscope was then utilized. This colonoscope was able to traverse the area of stenosis which measured approximately 3 cm in length. 50% of the lumen was ulcerated. 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 MMR stains are pending.  He was discharged home on 08/19/2014.  CURRENT THERAPY:  5-fu, every 2 weeks   INTERIM HISTORY:  Tyler Evans returns for follow-up. He is doing well and is not having any issues with the chemotherapy other than some tingling and numbness in his hands and toes. He can still do daily activities. Denies balance problems. He says it's not getting worse, it is staying the same. His eyes have been watering more. His vision is the same. Pt is eating normal and has good energy. Denies any other concerns.   This is a white MEDICAL HISTORY:  Past Medical History:  Diagnosis Date  . Diabetes mellitus without complication ([place])   . Hypertension   . Small bowel cancer ([place]) 08/18/2014    SURGICAL HISTORY: Past Surgical History:  Procedure Laterality Date  . ENTEROSCOPY N/A 08/18/2014   Procedure: ENTEROSCOPY;  Surgeon: Carol Ada, MD;  Location: [facility];  Service: Endoscopy;  Laterality: N/A;    SOCIAL HISTORY: History   Social History  . Marital Status: Married    Spouse Name: N/A  . Number of Children: 2  . Years of  Education: N/A   Occupational History  .  retired Ambulance person    Social History Main Topics  . Smoking status: Never Smoker   . Smokeless tobacco: Not on file  . Alcohol Use: No  . Drug Use: Not on file  . Sexual Activity: Not on file   Other Topics Concern  . Not on file   Social History Narrative  He lives in [place]. He is married. He has 2 children both reported to be in good health. No tobacco or alcohol use. He is a retired Ambulance person.  FAMILY HISTORY: Family History  Problem Relation Age of Onset  . Heart failure, Alzheimer's  Mother   . Lung cancer Father   . Kidney cancer Brother     ALLERGIES:  is allergic to penicillins.  MEDICATIONS:  Current Outpatient Prescriptions on File Prior to Visit  Medication Sig Dispense Refill  . atorvastatin (LIPITOR) 20 MG tablet Take 20 mg by mouth every evening.     Marland Kitchen  diphenoxylate-atropine (LOMOTIL) 2.5-0.025 MG tablet Take 2 tablets by mouth 4 (four) times daily as needed for diarrhea or loose stools. 60 tablet 0  . gabapentin (NEURONTIN) 100 MG capsule Take 2 capsules (200 mg total) by mouth 3 (three) times daily. 180 capsule 1  . LANTUS SOLOSTAR 100 UNIT/ML Solostar Pen Inject 20 Units into the skin every evening.     . lidocaine-prilocaine (EMLA) cream Apply topically as needed. Apply to portacath 1 1/2 hours - 2 hours prior to procedures as needed. (Patient taking differently: Apply 1 application topically daily as needed (port access). Apply to portacath 1 1/2 hours - 2 hours prior to procedures) 30 g 2  . loperamide (IMODIUM) 2 MG capsule Take 4 mg by mouth 4 (four) times daily as needed for diarrhea or loose stools.    . potassium chloride SA (KLOR-CON M20) 20 MEQ tablet Take 1 tablet (20 mEq total) by mouth daily. 30 tablet 1  . tamsulosin (FLOMAX) 0.4 MG CAPS capsule Take 0.4 mg by mouth every evening.   12  . lisinopril (PRINIVIL,ZESTRIL) 10 MG tablet Take 10 mg by mouth every evening.   6   No current  facility-administered medications on file prior to visit.   ;  REVIEW OF SYSTEMS:   Constitutional: Denies fevers, chills or abnormal night sweats; mild fatigue, weight is stable.  Eyes: Denies blurriness of vision, double vision or watery eyes Ears, nose, mouth, throat, and face: Denies mucositis or sore throat Respiratory: One month history of a nonproductive cough;  no shortness of breath Cardiovascular: Denies palpitation, chest discomfort or lower extremity swelling Gastrointestinal:  Denies nausea, heartburn. No dysphagia. He has noted less frequent bowel movements over the past 2 weeks. Does not characterize this as constipation. Skin: Denies abnormal skin rashes Lymphatics: Denies new lymphadenopathy or easy bruising Neurological: Denies extremity weakness (+) tingling and numbness in hands and feet Behavioral/Psych: Mood is stable, no new changes  All other systems were reviewed with the patient and are negative.  PHYSICAL EXAMINATION: ECOG PERFORMANCE STATUS: 1  Vitals:   06/24/16 1041  BP: 122/61  Pulse: (!) 57  Resp: 18  Temp: 97.9 F (36.6 C)   Filed Weights   06/24/16 1041  Weight: 174 lb 9.6 oz (79.2 kg)   Vitals with BMI 06/24/2016  Height '5\' 6"'$   Weight 174 lbs 10 oz  BMI 09.9  Systolic 833  Diastolic 61  Pulse 57  Respirations 18   GENERAL:alert, no distress and comfortable SKIN: skin color, texture, turgor are normal, (+) skin pigmentation On his face, and hands, from her recent Xeloda. Skin peeling on palms has much improved.   EYES: normal, conjunctiva are pink and non-injected, sclera clear OROPHARYNX:no exudate, no erythema and lips, buccal mucosa, and tongue normal  NECK: supple, thyroid normal size, non-tender, without nodularity LYMPH:  no palpable lymphadenopathy in the cervical, axillary or inguinal regions LUNGS: clear to auscultation and percussion with normal breathing effort HEART: regular rate & rhythm and no murmurs and no lower extremity  edema ABDOMEN:abdomen soft, mild tenderness at the right upper quadrant , no rebound pain, normal bowel sounds Musculoskeletal:no cyanosis of digits and no clubbing  PSYCH: alert & oriented x 3 with fluent speech NEURO: no focal motor deficits, his light touch sensation and vibration sensation are diminished on his hands and feet.   LABORATORY DATA:  I have reviewed the data as listed CBC Latest Ref Rng & Units 06/24/2016 06/10/2016 05/27/2016  WBC 4.0 - 10.3 10e3/uL 5.5 5.0  4.4  Hemoglobin 13.0 - 17.1 g/dL 10.3(L) 9.9(L) 9.6(L)  Hematocrit 38.4 - 49.9 % 31.7(L) 30.5(L) 29.5(L)  Platelets 140 - 400 10e3/uL 124(L) 88(L) 96(L)    CMP Latest Ref Rng & Units 06/24/2016 06/10/2016 05/27/2016  Glucose 70 - 140 mg/dl 96 129 107  BUN 7.0 - 26.0 mg/dL 13.8 13.2 11.0  Creatinine 0.7 - 1.3 mg/dL 1.0 1.0 0.9  Sodium 136 - 145 mEq/L 141 141 141  Potassium 3.5 - 5.1 mEq/L 3.8 3.8 3.8  Chloride 101 - 111 mmol/L - - -  CO2 22 - 29 mEq/L '28 26 26  '$ Calcium 8.4 - 10.4 mg/dL 9.3 9.1 8.6  Total Protein 6.4 - 8.3 g/dL 6.5 6.4 6.2(L)  Total Bilirubin 0.20 - 1.20 mg/dL 0.64 0.50 0.45  Alkaline Phos 40 - 150 U/L 87 80 80  AST 5 - 34 U/L '23 22 24  '$ ALT 0 - 55 U/L '12 14 16   '$ CEA: 08/31/2014: 189 12/12/2014: 9.0 03/27/2015: 5.0 05/29/2015: 17.6 07/09/2015: 41 11/26/2015: 153 01/30/2016: 523 02/29/2016: 985 03/31/2016: 295 04/29/2016: 244.69 05/27/2016: 347 06/24/2016: 427  Pathology report:  Small Intestine Biopsy, jejunal mass 08/18/2014 - INVASIVE ADENOCARCINOMA IN A BACKGROUND OF TUBULOVILLOUS ADENOMA. ADDITIONAL INFORMATION: Mismatch Repair (MMR) Protein Immunohistochemistry (IHC) IHC Expression Result (LIMITED TUMOR): MLH1: Preserved nuclear expression (greater 50% tumor expression) MSH2: Preserved nuclear expression (greater 50% tumor expression) MSH6: Preserved nuclear expression (greater 50% tumor expression) PMS2: Preserved nuclear expression (greater 50% tumor expression) * Internal control  demonstrates intact nuclear expression Interpretation: NORMAL There is preserved expression  Diagnosis 08/28/2014  Liver, needle/core biopsy, right lobe - METASTATIC ADENOCARCINOMA       RADIOGRAPHIC STUDIES: I have personally reviewed the radiological images as listed and agreed with the findings in the report.  CT CAP w/ Contrast 06/17/16 IMPRESSION: Mild increase in size of several hepatic metastases.  Stable sub-cm bilateral pulmonary metastases.  Interval resolution of diffuse colitis since prior exam.  Incidentally noted aortic atherosclerosis, cholelithiasis, and mildly enlarged prostate.  CT abdomen and pelvis with contrast 03/19/2016 IMPRESSION: 1. New colonic wall thickening and surrounding inflammation most consistent with diffuse colitis. Consider pseudomembranous (Clostridium difficile) colitis. Other considerations include viral infection and drug reaction. 2. No evidence of bowel obstruction, perforation or abscess. 3. Interval improvement in multifocal hepatic metastatic disease. 4. Stable nodularity at both lung bases. 5. Cholelithiasis and atherosclerosis again noted.    ASSESSMENT & PLAN:  72 y.o. gentleman with past medical history of diabetes, hypertension, who presents with anemia and weakness.  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  -Unfortunately he has stage IV disease with diffuse liver metastasis, this is an incurable disease, with overall very poor prognosis. -I previously reviewed his staging CT scan results from 11/26/2015 with patient and his wife, Unfortunately scan showed disease progression in the liver, his tumor marker CA has been trending up lately, which is consistent with disease progression. -I previously discussed his [facility] testing  results, which showed mutations in [place], RAF 1, APC, TP 40,  Targeted therapy is nt available at this point. -I previously discussed and reviewed his restaging CT images from 01/28/2016 which showed disease progression in liver and lungs. I recommend him to stop gemcitabine -His tumor marker CEA has been trending up, corresponding to his disease progression -I have checked the clinical trial options at [facility] and [facility], unfortunately there  is no suitable trials for him now.  -He received 2 cycles of Xeloda, but unfortunately developed moderate to severe side effects, and I stopped it. -His restaging CT scan showed partial response to Xeloda, I recommended him to switch to 5-fu, he is tolerating well, we'll continue. -Reviewed  restaging CT scans from 11/15/2016, which showed a mild case of progression in his liver, stable multiple small on nodules. -I recommend him to change treatment, unfortunately his very limited options. He previous had a response to Xeloda, but did not tolerate it well. I recommend him try Xeloda cancer, with reduced dose to 1500 mg bid, 2 weeks on, one-week off. He agrees. According to Xeloda to [place] today, he will start later this week. -Lab results reviewed, mild thrombocytopenia, adequate for treatment, we'll proceed with Xeloda this week. -If he has further disease with progression on Xeloda, I would consider PD-1 inhibitor and MEK inhibitor as next line Therapy. It has showed encouraging response in [place] high stable metastatic colorectal cancers in the clinical trial, but has not been approved.   2. 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  Hypertension and DM  -He will continue follow-up with his primary care physician. -He has been off lisinopril lately due to borderline low blood pressure, his blood pressure is not elevated today, he will continue monitoring at home, and restart lisinopril if needed -continue close monitoring   4. Peripheral neuropathy, secondary to chemotherapy, G1 -His peripheral neuropathy, worse after Xeloda, especially with skin reactions, much improving now. -Continue Neurontin  200 mg 3 times a day -We'll continue observation.  5. Goal of care discussion  -We again discussed the incurable nature of his cancer, and the overall poor prognosis, especially if he does not have good response to chemotherapy or progress on chemo -The patient understands the goal of care is palliative. -I recommend DNR/DNI, he will think about it   Plan: -Lab reviewed, adequate for treatment. He will proceed with lower dosage of Xeloda later this week, '1500mg'$  bid, 2 weeks on, one-week off -Prescription for Xeloda was called in to [facility] -Reviewed CT. Plan to repeat CT after 3 cycles of Xeloda.  -Follow up in 3 weeks with APP. I will see him back in 6 weeks.   I spent 40 minutes before his visit, more than 50% on face-to-face counseling.  This document serves as a record of services personally performed by Truitt Merle, MD. It was created on her behalf by Martinique Casey, a trained medical scribe. The creation of this record is based on the scribe's personal observations and the provider's statements to them. This document has been checked and approved by the attending provider.  I have reviewed the above documentation for accuracy and completeness, and I agree with the above information.      Truitt Merle  06/24/2016

## 2016-06-24 ENCOUNTER — Ambulatory Visit: Payer: Medicare Other

## 2016-06-24 ENCOUNTER — Other Ambulatory Visit (HOSPITAL_BASED_OUTPATIENT_CLINIC_OR_DEPARTMENT_OTHER): Payer: Medicare Other

## 2016-06-24 ENCOUNTER — Encounter: Payer: Self-pay | Admitting: Hematology

## 2016-06-24 ENCOUNTER — Ambulatory Visit (HOSPITAL_BASED_OUTPATIENT_CLINIC_OR_DEPARTMENT_OTHER): Payer: Medicare Other | Admitting: Hematology

## 2016-06-24 ENCOUNTER — Telehealth: Payer: Self-pay | Admitting: Hematology

## 2016-06-24 VITALS — BP 122/61 | HR 57 | Temp 97.9°F | Resp 18 | Ht 66.0 in | Wt 174.6 lb

## 2016-06-24 DIAGNOSIS — D6481 Anemia due to antineoplastic chemotherapy: Secondary | ICD-10-CM

## 2016-06-24 DIAGNOSIS — C772 Secondary and unspecified malignant neoplasm of intra-abdominal lymph nodes: Secondary | ICD-10-CM | POA: Diagnosis not present

## 2016-06-24 DIAGNOSIS — Z95828 Presence of other vascular implants and grafts: Secondary | ICD-10-CM

## 2016-06-24 DIAGNOSIS — E119 Type 2 diabetes mellitus without complications: Secondary | ICD-10-CM

## 2016-06-24 DIAGNOSIS — D630 Anemia in neoplastic disease: Secondary | ICD-10-CM | POA: Diagnosis not present

## 2016-06-24 DIAGNOSIS — C172 Malignant neoplasm of ileum: Secondary | ICD-10-CM

## 2016-06-24 DIAGNOSIS — C787 Secondary malignant neoplasm of liver and intrahepatic bile duct: Secondary | ICD-10-CM

## 2016-06-24 DIAGNOSIS — D500 Iron deficiency anemia secondary to blood loss (chronic): Secondary | ICD-10-CM

## 2016-06-24 DIAGNOSIS — C179 Malignant neoplasm of small intestine, unspecified: Secondary | ICD-10-CM

## 2016-06-24 DIAGNOSIS — G620 Drug-induced polyneuropathy: Secondary | ICD-10-CM

## 2016-06-24 DIAGNOSIS — Z7189 Other specified counseling: Secondary | ICD-10-CM | POA: Insufficient documentation

## 2016-06-24 DIAGNOSIS — T451X5A Adverse effect of antineoplastic and immunosuppressive drugs, initial encounter: Secondary | ICD-10-CM

## 2016-06-24 DIAGNOSIS — I10 Essential (primary) hypertension: Secondary | ICD-10-CM

## 2016-06-24 DIAGNOSIS — D509 Iron deficiency anemia, unspecified: Secondary | ICD-10-CM

## 2016-06-24 LAB — CBC WITH DIFFERENTIAL/PLATELET
BASO%: 0.4 % (ref 0.0–2.0)
BASOS ABS: 0 10*3/uL (ref 0.0–0.1)
EOS%: 1.8 % (ref 0.0–7.0)
Eosinophils Absolute: 0.1 10*3/uL (ref 0.0–0.5)
HEMATOCRIT: 31.7 % — AB (ref 38.4–49.9)
HEMOGLOBIN: 10.3 g/dL — AB (ref 13.0–17.1)
LYMPH#: 1.3 10*3/uL (ref 0.9–3.3)
LYMPH%: 24.1 % (ref 14.0–49.0)
MCH: 31.2 pg (ref 27.2–33.4)
MCHC: 32.5 g/dL (ref 32.0–36.0)
MCV: 95.9 fL (ref 79.3–98.0)
MONO#: 0.8 10*3/uL (ref 0.1–0.9)
MONO%: 13.9 % (ref 0.0–14.0)
NEUT%: 59.8 % (ref 39.0–75.0)
NEUTROS ABS: 3.3 10*3/uL (ref 1.5–6.5)
Platelets: 124 10*3/uL — ABNORMAL LOW (ref 140–400)
RBC: 3.3 10*6/uL — ABNORMAL LOW (ref 4.20–5.82)
RDW: 17.1 % — AB (ref 11.0–14.6)
WBC: 5.5 10*3/uL (ref 4.0–10.3)

## 2016-06-24 LAB — COMPREHENSIVE METABOLIC PANEL
ALT: 12 U/L (ref 0–55)
ANION GAP: 8 meq/L (ref 3–11)
AST: 23 U/L (ref 5–34)
Albumin: 3.3 g/dL — ABNORMAL LOW (ref 3.5–5.0)
Alkaline Phosphatase: 87 U/L (ref 40–150)
BILIRUBIN TOTAL: 0.64 mg/dL (ref 0.20–1.20)
BUN: 13.8 mg/dL (ref 7.0–26.0)
CALCIUM: 9.3 mg/dL (ref 8.4–10.4)
CHLORIDE: 105 meq/L (ref 98–109)
CO2: 28 meq/L (ref 22–29)
Creatinine: 1 mg/dL (ref 0.7–1.3)
EGFR: 79 mL/min/{1.73_m2} — AB (ref >90)
Glucose: 96 mg/dl (ref 70–140)
Potassium: 3.8 mEq/L (ref 3.5–5.1)
Sodium: 141 mEq/L (ref 136–145)
TOTAL PROTEIN: 6.5 g/dL (ref 6.4–8.3)

## 2016-06-24 LAB — CEA (IN HOUSE-CHCC): CEA (CHCC-In House): 427.92 ng/mL — ABNORMAL HIGH (ref 0.00–5.00)

## 2016-06-24 MED ORDER — CAPECITABINE 500 MG PO TABS: 1500.0000 mg | ORAL_TABLET | Freq: Two times a day (BID) | ORAL | 2 refills | Status: DC

## 2016-06-24 MED ORDER — HEPARIN SOD (PORK) LOCK FLUSH 100 UNIT/ML IV SOLN
500.0000 [IU] | Freq: Once | INTRAVENOUS | Status: AC | PRN
  Administered 2016-06-24: 500 [IU] via INTRAVENOUS
  Filled 2016-06-24: qty 5

## 2016-06-24 MED ORDER — SODIUM CHLORIDE 0.9 % IJ SOLN
10.0000 mL | INTRAMUSCULAR | Status: DC | PRN
  Administered 2016-06-24: 10 mL via INTRAVENOUS
  Filled 2016-06-24: qty 10

## 2016-06-24 MED FILL — CAPECITABINE 500 MG TABLET: 500 | 14 days supply | Qty: 84 | Fill #0

## 2016-06-24 NOTE — Telephone Encounter (Signed)
Appointments scheduled per 06/24/16 los. Patient was given a copy of the appointment schedule and AVS report, per 06/24/16 los. °

## 2016-07-01 ENCOUNTER — Other Ambulatory Visit: Payer: Self-pay | Admitting: *Deleted

## 2016-07-01 DIAGNOSIS — C179 Malignant neoplasm of small intestine, unspecified: Secondary | ICD-10-CM

## 2016-07-01 DIAGNOSIS — K529 Noninfective gastroenteritis and colitis, unspecified: Secondary | ICD-10-CM

## 2016-07-01 MED ORDER — POTASSIUM CHLORIDE CRYS ER 20 MEQ PO TBCR: 20.0000 meq | EXTENDED_RELEASE_TABLET | Freq: Every day | ORAL | 1 refills | Status: AC

## 2016-07-15 ENCOUNTER — Other Ambulatory Visit (HOSPITAL_BASED_OUTPATIENT_CLINIC_OR_DEPARTMENT_OTHER): Payer: Medicare Other

## 2016-07-15 ENCOUNTER — Other Ambulatory Visit: Payer: Self-pay

## 2016-07-15 ENCOUNTER — Ambulatory Visit (HOSPITAL_BASED_OUTPATIENT_CLINIC_OR_DEPARTMENT_OTHER): Payer: Medicare Other | Admitting: Nurse Practitioner

## 2016-07-15 ENCOUNTER — Telehealth: Payer: Self-pay | Admitting: Hematology

## 2016-07-15 VITALS — BP 132/60 | HR 67 | Temp 97.6°F | Resp 18 | Ht 66.0 in | Wt 174.2 lb

## 2016-07-15 DIAGNOSIS — E119 Type 2 diabetes mellitus without complications: Secondary | ICD-10-CM

## 2016-07-15 DIAGNOSIS — D6481 Anemia due to antineoplastic chemotherapy: Secondary | ICD-10-CM | POA: Diagnosis not present

## 2016-07-15 DIAGNOSIS — C179 Malignant neoplasm of small intestine, unspecified: Secondary | ICD-10-CM | POA: Diagnosis not present

## 2016-07-15 DIAGNOSIS — D509 Iron deficiency anemia, unspecified: Secondary | ICD-10-CM

## 2016-07-15 DIAGNOSIS — C787 Secondary malignant neoplasm of liver and intrahepatic bile duct: Secondary | ICD-10-CM | POA: Diagnosis not present

## 2016-07-15 DIAGNOSIS — L271 Localized skin eruption due to drugs and medicaments taken internally: Secondary | ICD-10-CM

## 2016-07-15 DIAGNOSIS — G620 Drug-induced polyneuropathy: Secondary | ICD-10-CM

## 2016-07-15 DIAGNOSIS — D500 Iron deficiency anemia secondary to blood loss (chronic): Secondary | ICD-10-CM

## 2016-07-15 DIAGNOSIS — L818 Other specified disorders of pigmentation: Secondary | ICD-10-CM

## 2016-07-15 LAB — CBC WITH DIFFERENTIAL/PLATELET
BASO%: 0.5 % (ref 0.0–2.0)
BASOS ABS: 0 10*3/uL (ref 0.0–0.1)
EOS ABS: 0.1 10*3/uL (ref 0.0–0.5)
EOS%: 1.7 % (ref 0.0–7.0)
HEMATOCRIT: 32.4 % — AB (ref 38.4–49.9)
HEMOGLOBIN: 10.5 g/dL — AB (ref 13.0–17.1)
LYMPH#: 1.4 10*3/uL (ref 0.9–3.3)
LYMPH%: 22.2 % (ref 14.0–49.0)
MCH: 31.3 pg (ref 27.2–33.4)
MCHC: 32.4 g/dL (ref 32.0–36.0)
MCV: 96.6 fL (ref 79.3–98.0)
MONO#: 0.7 10*3/uL (ref 0.1–0.9)
MONO%: 11.1 % (ref 0.0–14.0)
NEUT%: 64.5 % (ref 39.0–75.0)
NEUTROS ABS: 4.2 10*3/uL (ref 1.5–6.5)
PLATELETS: 141 10*3/uL (ref 140–400)
RBC: 3.36 10*6/uL — ABNORMAL LOW (ref 4.20–5.82)
RDW: 16.8 % — AB (ref 11.0–14.6)
WBC: 6.5 10*3/uL (ref 4.0–10.3)

## 2016-07-15 LAB — COMPREHENSIVE METABOLIC PANEL
ALBUMIN: 3.4 g/dL — AB (ref 3.5–5.0)
ALK PHOS: 84 U/L (ref 40–150)
ALT: 11 U/L (ref 0–55)
AST: 19 U/L (ref 5–34)
Anion Gap: 11 mEq/L (ref 3–11)
BILIRUBIN TOTAL: 0.47 mg/dL (ref 0.20–1.20)
BUN: 12 mg/dL (ref 7.0–26.0)
CALCIUM: 9.3 mg/dL (ref 8.4–10.4)
CO2: 26 mEq/L (ref 22–29)
Chloride: 104 mEq/L (ref 98–109)
Creatinine: 1.1 mg/dL (ref 0.7–1.3)
EGFR: 63 mL/min/{1.73_m2} — AB (ref >90)
GLUCOSE: 173 mg/dL — AB (ref 70–140)
Potassium: 3.7 mEq/L (ref 3.5–5.1)
SODIUM: 141 meq/L (ref 136–145)
TOTAL PROTEIN: 6.6 g/dL (ref 6.4–8.3)

## 2016-07-15 LAB — CEA (IN HOUSE-CHCC): CEA (CHCC-IN HOUSE): 709.32 ng/mL — AB (ref 0.00–5.00)

## 2016-07-15 MED ORDER — CAPECITABINE 500 MG PO TABS: ORAL_TABLET | ORAL | 0 refills | Status: DC

## 2016-07-15 NOTE — Telephone Encounter (Signed)
No los for 07/15/16 visit.

## 2016-07-15 NOTE — Progress Notes (Signed)
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       08/18/2014 Miscellaneous    tumor KRAS mutation (-)      08/18/2014 Pathology Results    Liver biopsy showed metastatic adenocarcinoma, consistent with GI primary      08/18/2014 Initial Diagnosis    Small bowel cancer      08/18/2014 Procedure    small bowel enteroscopy with biopsy by Dr. Benson Norway showed at the proximal jejunum there was evidence of abnormal mucosa and friability, biopsy was obtained.       09/05/2014 Imaging    PET hypermetabolic mass involving a small bowel loop with adjacent mesenteric lymphadenopathy, and diffuse liver metastasis and mild hypermetabolic lymphadenopathy in porta hepatis.      09/06/2014 - 02/01/2015 Chemotherapy    mFOLFOX, stopped due to his neuropathy, and earlier mild disease progression on PET       09/07/2014 - 09/11/2014 Hospital Admission    He was admitted for fever and GI bleeding. Received 3 units of RBC.      10/26/2014 Imaging    Interval significant improvement in the primary small bowel mass, adjacent lymphadenopathy and extensive hepatic metastatic disease. No other new lesions.       02/09/2015 Imaging    PET/CT scan showed stable primary malignancy in the proximal jejunum, mild metabolic progression of the 3 residual metabolic liver metastasis. CT  Portion showed decreased size of his liver metastasis.      02/27/2015 - 11/06/2015 Chemotherapy    second line chemo FOLFIRI, every 2 weeks, and panitumumab (held for second cycle due to severe skin rashes),  chemotherapy stopped due to disease progression.      08/06/2015 Imaging    Stable number and size of the liver lesions. The left lower lobe nodule is considerably less prominent. No other new lesions.      11/26/2015 Progression    Restaging CT chest, abdomen and pelvis with contrast showed interval increase in size of liver lesions, multiple small pulmonary nodules are not significantly changed in the interval. Her tumor marker CEA also increased significantly      12/07/2015 - 01/16/2016 Chemotherapy    Gemcitabine 1000 mg/m2 (decreased to 800 mg/m from second dose), and a one and 8 every 21 days, stopped due to disease progression       02/12/2016 - 03/19/2016 Chemotherapy    Xeloda '1000mg'$ /m2 twice daily, 2 weeks on, one-week off, stopped after 2 cycles due to severe side effects (diarrhea, skin toxicities)      03/19/2016 - 03/21/2016 Hospital Admission    Patient was admitted for severe diarrhea, dehydration after second cycle of Xeloda, stoll c-diff was negative. He received IV fluids and supportive care.      06/17/2016 Imaging    CT Chest, Abdomen, Pelvis w/ Contrast  IMPRESSION: Mild increase in size of several hepatic metastases.  Stable sub-cm bilateral pulmonary metastases.  Interval resolution of diffuse colitis since prior exam.  Incidentally noted aortic atherosclerosis, cholelithiasis, and mildly enlarged prostate.       INTERVAL HISTORY:   Mr. Nienhaus  returns as scheduled. He began a cycle of Xeloda 06/25/2016. During the second week he developed hand and foot redness/tenderness. No mouth sores. No nausea or vomiting. No diarrhea.  Objective:  Vital signs in last 24 hours:  Blood pressure 132/60, pulse 67, temperature 97.6 F (36.4 C), temperature source Oral, resp. rate 18, height '5\' 6"'$  (1.676 m), weight 174 lb 3.4 oz (79 kg), SpO2 100 %.    HEENT: No thrush or ulcers. Resp: Lungs clear bilaterally. Cardio: Regular  rate and rhythm. GI: Abdomen soft and nontender. Question palpable liver edge. Vascular: No leg edema. Calves soft and nontender. Skin: Palms and soles with skin thickening, erythema, tenderness.  Port-A-Cath without erythema.  Lab Results:  Lab Results  Component Value Date   WBC 6.5 07/15/2016   HGB 10.5 (L) 07/15/2016   HCT 32.4 (L) 07/15/2016   MCV 96.6 07/15/2016   PLT 141 07/15/2016   NEUTROABS 4.2 07/15/2016    Imaging:  No results found.  Medications: I have reviewed the patient's current medications.  Assessment/Plan: 1. 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 CT scan on 03/19/2016 while he was hospitalized with severe diarrhea showed improvement in metastatic disease involving the liver. 2. Microcytic anemia secondary to GI bleeding, iron deficiency and chemotherapy 3. Hypertension and diabetes mellitus 4. Peripheral neuropathy secondary to chemotherapy. He continues Neurontin. 5. Hospitalization 03/19/2016 through 03/21/2016 with severe diarrhea, single episode of blood-tinged emesis; CT scan showed new colonic wall thickening and surrounding inflammation most consistent with diffuse colitis. Metastatic disease involving the liver appeared improved. 6. Hand-foot syndrome secondary to Xeloda. 7. Skin hyperpigmentation secondary to Xeloda.   Disposition: Mr. Netherton recently completed a cycle of Xeloda. During the second week he developed hand/foot pain/tenderness and erythema. He reports symptoms are similar to when he took Xeloda in the past. He is scheduled to begin the next cycle of Xeloda tomorrow, 07/16/2016. We decided to hold the next cycle for an additional week and adjust the schedule to 7 days on/7 days off. He will begin the next cycle of Xeloda on 07/22/2016 if hand-foot symptoms are better.  If they are not better he understands to hold Xeloda and contact the office.  He will return for a follow-up visit as scheduled in 3 weeks.  Plan reviewed with Dr. Benay Spice. 25 minutes were spent face-to-face at today's visit with the majority of that time involved in counseling/coordination of care.    Ned Card ANP/GNP-BC   07/15/2016  9:42 AM

## 2016-07-22 ENCOUNTER — Other Ambulatory Visit: Payer: Self-pay | Admitting: Nurse Practitioner

## 2016-07-22 DIAGNOSIS — C179 Malignant neoplasm of small intestine, unspecified: Secondary | ICD-10-CM

## 2016-07-22 DIAGNOSIS — T451X5A Adverse effect of antineoplastic and immunosuppressive drugs, initial encounter: Principal | ICD-10-CM

## 2016-07-22 DIAGNOSIS — G620 Drug-induced polyneuropathy: Secondary | ICD-10-CM

## 2016-08-04 NOTE — Progress Notes (Signed)
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       08/18/2014 Miscellaneous    tumor KRAS mutation (-)      08/18/2014 Pathology Results    Liver biopsy showed metastatic adenocarcinoma, consistent with GI primary      08/18/2014 Initial Diagnosis    Small bowel cancer      08/18/2014 Procedure    small bowel enteroscopy with biopsy by Dr. Benson Norway showed at the proximal jejunum there was evidence of abnormal mucosa and friability, biopsy was obtained.       09/05/2014 Imaging    PET hypermetabolic mass involving a small bowel loop with adjacent mesenteric lymphadenopathy, and diffuse liver metastasis and mild hypermetabolic lymphadenopathy in porta hepatis.      09/06/2014 - 02/01/2015 Chemotherapy    mFOLFOX, stopped due to his neuropathy, and earlier mild disease progression on PET       09/07/2014 - 09/11/2014 Hospital Admission    He was admitted for fever and GI bleeding. Received 3 units of RBC.      10/26/2014 Imaging    Interval significant improvement in the primary small bowel mass, adjacent lymphadenopathy and extensive hepatic metastatic disease. No other new lesions.       02/09/2015 Imaging    PET/CT scan showed stable primary malignancy in the proximal jejunum, mild metabolic progression of the 3 residual metabolic liver metastasis. CT  Portion showed decreased size of his liver metastasis.      02/27/2015 - 11/06/2015 Chemotherapy    second line  chemo FOLFIRI, every 2 weeks, and panitumumab (held for second cycle due to severe skin rashes), chemotherapy stopped due to disease progression.      08/06/2015 Imaging    Stable number and size of the liver lesions. The left lower lobe nodule is considerably less prominent. No other new lesions.      11/26/2015 Progression    Restaging CT chest, abdomen and pelvis with contrast showed interval increase in size of liver lesions, multiple small pulmonary nodules are not significantly changed in the interval. Her tumor marker CEA also increased significantly      12/07/2015 - 01/16/2016 Chemotherapy    Gemcitabine 1000 mg/m2 (decreased to 800 mg/m from second dose), and a one and 8 every 21 days, stopped due to disease progression       02/12/2016 - 03/19/2016 Chemotherapy    Xeloda '1000mg'$ /m2 twice daily, 2 weeks on, one-week off, stopped after 2 cycles due to severe side effects (diarrhea, skin toxicities)      03/19/2016 - 03/21/2016 Hospital Admission    Patient was admitted for severe diarrhea, dehydration after second cycle of Xeloda, stoll c-diff was negative. He received IV fluids and supportive care.      06/17/2016 Imaging    CT Chest, Abdomen, Pelvis w/ Contrast  IMPRESSION: Mild increase in size of several hepatic metastases.  Stable sub-cm bilateral pulmonary metastases.  Interval resolution of diffuse colitis  since prior exam.  Incidentally noted aortic atherosclerosis, cholelithiasis, and mildly enlarged prostate.      HISTORY OF PRESENTING ILLNESS:  Tyler Evans 71 y.o. male is here because of a recent diagnosis of small bowel cancer. He presented to his PCP on 08/17/2014 with complaints of feeling weak and a one month history of a cough. He was found to be anemic and was referred to the emergency department. Hemoglobin was found to be 7.6, MCV 76.6; creatinine was elevated at 1.74. Stool was Hemoccult positive.  CT abdomen/pelvis without contrast on 08/17/2014  showed multiple large masses within the liver, bulky in appearance. The largest measured approximately 5.7 cm. There appeared to be a focal filling defect within the proximal jejunum measuring approximately 2.5 x 1.7 cm. There was mild adjacent jejunal wall thickening. The lung bases were clear.  On 08/18/2014 he underwent a small bowel enteroscopy with biopsy by Dr. Benson Norway. The esophagus and gastric lumen were normal. At the proximal jejunum there was evidence of abnormal mucosa and friability. The pediatric colonoscope was not able to traverse the area. Biopsies were obtained. An ultraslim colonoscope was then utilized. This colonoscope was able to traverse the area of stenosis which measured approximately 3 cm in length. 50% of the lumen was ulcerated. 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 MMR stains are pending.  He was discharged home on 08/19/2014.  CURRENT THERAPY: Xeloda '1500mg'$  bid, 7 days on and 7 days off   INTERIM HISTORY:  Mr. Nyland returns for follow-up. He was last seen by APP Lattie Haw 2 weeks ago, due to his skin toxicity from Xeloda, Xeloda was reduced to 7 days on and 7 days off. His tolerating better. He has been doing well. His palms are still red, with some skin cracking. If he keeps them moisturized, they don't crack much. He has some sensitivity in his hands. The tingling on his hands and feet is still there. There is starting to be some numbness on his feet. His eyes tend to be watery, but this is tolerable and not very often. Denies diarrhea, nausea, loss of appetite, abdominal pain, or any other concerns.   This is a white MEDICAL HISTORY:  Past Medical History:  Diagnosis Date  . Diabetes mellitus without complication ([place])   . Hypertension   . Small bowel cancer ([place]) 08/18/2014    SURGICAL HISTORY: Past Surgical History:  Procedure Laterality Date  . ENTEROSCOPY N/A 08/18/2014   Procedure: ENTEROSCOPY;  Surgeon: Carol Ada,  MD;  Location: [facility];  Service: Endoscopy;  Laterality: N/A;    SOCIAL HISTORY: History   Social History  . Marital Status: Married    Spouse Name: N/A  . Number of Children: 2  . Years of Education: N/A   Occupational History  .  retired Ambulance person    Social History Main Topics  . Smoking status: Never Smoker   . Smokeless tobacco: Not on file  . Alcohol Use: No  . Drug Use: Not on file  . Sexual Activity: Not on file   Other Topics Concern  . Not on file   Social History Narrative  He lives in [place]. He is married. He has 2 children both reported to be in good health. No tobacco or alcohol use. He is a retired Ambulance person.  FAMILY HISTORY: Family History  Problem Relation Age of Onset  . Heart failure, Alzheimer's  Mother   . Lung cancer Father   . Kidney cancer Brother  ALLERGIES:  is allergic to penicillins.  MEDICATIONS:  Current Outpatient Prescriptions on File Prior to Visit  Medication Sig Dispense Refill  . atorvastatin (LIPITOR) 20 MG tablet Take 20 mg by mouth every evening.     . diphenoxylate-atropine (LOMOTIL) 2.5-0.025 MG tablet Take 2 tablets by mouth 4 (four) times daily as needed for diarrhea or loose stools. 60 tablet 0  . FREESTYLE LITE test strip AS DIRECTED ONCE A DAY TO CHECK BLOOD SUGARS IN VITRO 30 DAYS  12  . gabapentin (NEURONTIN) 100 MG capsule TAKE 2 CAPSULES BY MOUTH 3 TIMES DAILY 180 capsule 1  . LANTUS SOLOSTAR 100 UNIT/ML Solostar Pen Inject 20 Units into the skin every evening.     . lidocaine-prilocaine (EMLA) cream Apply topically as needed. Apply to portacath 1 1/2 hours - 2 hours prior to procedures as needed. (Patient taking differently: Apply 1 application topically daily as needed (port access). Apply to portacath 1 1/2 hours - 2 hours prior to procedures) 30 g 2  . lisinopril (PRINIVIL,ZESTRIL) 10 MG tablet Take 10 mg by mouth every evening.   6  . loperamide (IMODIUM) 2 MG capsule Take 4 mg by mouth 4  (four) times daily as needed for diarrhea or loose stools.    . potassium chloride SA (KLOR-CON M20) 20 MEQ tablet Take 1 tablet (20 mEq total) by mouth daily. 30 tablet 1  . tamsulosin (FLOMAX) 0.4 MG CAPS capsule Take 0.4 mg by mouth every evening.   12   No current facility-administered medications on file prior to visit.   ;  REVIEW OF SYSTEMS:   Constitutional: Denies fevers, chills or abnormal night sweats; mild fatigue, weight is stable.  Eyes: Denies blurriness of vision, double vision (+) watery eyes Ears, nose, mouth, throat, and face: Denies mucositis or sore throat Respiratory: One month history of a nonproductive cough;  no shortness of breath Cardiovascular: Denies palpitation, chest discomfort or lower extremity swelling Gastrointestinal:  Denies nausea, heartburn. No dysphagia. He has noted less frequent bowel movements over the past 2 weeks. Does not characterize this as constipation. Skin: Denies abnormal skin rashes (+) red, cracking, sensitive skin on palms Lymphatics: Denies new lymphadenopathy or easy bruising Neurological: Denies extremity weakness (+) tingling and numbness in hands and feet Behavioral/Psych: Mood is stable, no new changes  All other systems were reviewed with the patient and are negative.  PHYSICAL EXAMINATION: ECOG PERFORMANCE STATUS: 1  Vitals:   08/05/16 0950  BP: 140/60  Pulse: 62  Resp: 18  Temp: 97.5 F (36.4 C)   Filed Weights   08/05/16 0950  Weight: 175 lb 12.8 oz (79.7 kg)   GENERAL:alert, no distress and comfortable SKIN: skin color, texture, turgor are normal, (+) skin pigmentation On his face, and hands, from her recent Xeloda. (+) Diffuse skin erythema on his palms and bottom of feet. Mild skin peeling on palms has improved.   EYES: normal, conjunctiva are pink and non-injected, sclera clear OROPHARYNX:no exudate, no erythema and lips, buccal mucosa, and tongue normal  NECK: supple, thyroid normal size, non-tender, without  nodularity LYMPH:  no palpable lymphadenopathy in the cervical, axillary or inguinal regions LUNGS: clear to auscultation and percussion with normal breathing effort HEART: regular rate & rhythm and no murmurs and no lower extremity edema ABDOMEN:abdomen soft, mild tenderness at the right upper quadrant , no rebound pain, normal bowel sounds Musculoskeletal:no cyanosis of digits and no clubbing  PSYCH: alert & oriented x 3 with fluent speech NEURO: no focal motor  deficits, his light touch sensation and vibration sensation are diminished on his hands and feet.   LABORATORY DATA:  I have reviewed the data as listed CBC Latest Ref Rng & Units 08/05/2016 07/15/2016 06/24/2016  WBC 4.0 - 10.3 10e3/uL 5.8 6.5 5.5  Hemoglobin 13.0 - 17.1 g/dL 10.4(L) 10.5(L) 10.3(L)  Hematocrit 38.4 - 49.9 % 32.2(L) 32.4(L) 31.7(L)  Platelets 140 - 400 10e3/uL 134(L) 141 124(L)    CMP Latest Ref Rng & Units 08/05/2016 07/15/2016 06/24/2016  Glucose 70 - 140 mg/dl 92 173(H) 96  BUN 7.0 - 26.0 mg/dL 14.4 12.0 13.8  Creatinine 0.7 - 1.3 mg/dL 1.0 1.1 1.0  Sodium 136 - 145 mEq/L 141 141 141  Potassium 3.5 - 5.1 mEq/L 3.7 3.7 3.8  Chloride 101 - 111 mmol/L - - -  CO2 22 - 29 mEq/L '28 26 28  '$ Calcium 8.4 - 10.4 mg/dL 9.2 9.3 9.3  Total Protein 6.4 - 8.3 g/dL 6.8 6.6 6.5  Total Bilirubin 0.20 - 1.20 mg/dL 0.54 0.47 0.64  Alkaline Phos 40 - 150 U/L 79 84 87  AST 5 - 34 U/L '21 19 23  '$ ALT 0 - 55 U/L '14 11 12   '$ CEA: 08/31/2014: 189 12/12/2014: 9.0 03/27/2015: 5.0 05/29/2015: 17.6 07/09/2015: 41 11/26/2015: 153 01/30/2016: 523 02/29/2016: 985 03/31/2016: 295 04/29/2016: 244.69 05/27/2016: 347 06/24/2016: 427 07/15/2016: 709.32  Pathology report:  Small Intestine Biopsy, jejunal mass 08/18/2014 - INVASIVE ADENOCARCINOMA IN A BACKGROUND OF TUBULOVILLOUS ADENOMA. ADDITIONAL INFORMATION: Mismatch Repair (MMR) Protein Immunohistochemistry (IHC) IHC Expression Result (LIMITED TUMOR): MLH1: Preserved nuclear expression  (greater 50% tumor expression) MSH2: Preserved nuclear expression (greater 50% tumor expression) MSH6: Preserved nuclear expression (greater 50% tumor expression) PMS2: Preserved nuclear expression (greater 50% tumor expression) * Internal control demonstrates intact nuclear expression Interpretation: NORMAL There is preserved expression  Diagnosis 08/28/2014  Liver, needle/core biopsy, right lobe - METASTATIC ADENOCARCINOMA       RADIOGRAPHIC STUDIES: I have personally reviewed the radiological images as listed and agreed with the findings in the report.  CT CAP w/ Contrast 06/17/16 IMPRESSION: Mild increase in size of several hepatic metastases.  Stable sub-cm bilateral pulmonary metastases.  Interval resolution of diffuse colitis since prior exam.  Incidentally noted aortic atherosclerosis, cholelithiasis, and mildly enlarged prostate.  CT abdomen and pelvis with contrast 03/19/2016 IMPRESSION: 1. New colonic wall thickening and surrounding inflammation most consistent with diffuse colitis. Consider pseudomembranous (Clostridium difficile) colitis. Other considerations include viral infection and drug reaction. 2. No evidence of bowel obstruction, perforation or abscess. 3. Interval improvement in multifocal hepatic metastatic disease. 4. Stable nodularity at both lung bases. 5. Cholelithiasis and atherosclerosis again noted.  ASSESSMENT & PLAN:  71 y.o. gentleman with past medical history of diabetes, hypertension, who presents with anemia and weakness.  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  -Unfortunately he has stage IV disease with diffuse liver metastasis, this is an incurable disease, with overall very poor prognosis. -I previously reviewed his staging CT scan results from  11/26/2015 with patient and his wife, Unfortunately scan showed disease progression in the liver, his tumor marker CA has been trending up lately, which is consistent with disease progression. -I previously discussed his [facility] testing results, which showed mutations in [place], RAF 1, APC, TP 53,  Targeted therapy is nt available at this point. -I previously discussed and reviewed his restaging  CT images from 01/28/2016 which showed disease progression in liver and lungs. I recommend him to stop gemcitabine -His tumor marker CEA has been trending up, corresponding to his disease progression -I have checked the clinical trial options at [facility] and [facility], unfortunately there is no suitable trials for him now.  -He received 2 cycles of Xeloda, but unfortunately developed moderate to severe side effects, and I stopped it. -His restaging CT scan showed partial response to Xeloda, I recommended him to switch to 5-fu, he is tolerating well, we'll continue. -Previously reviewed restaging CT scans from 11/15/2016, which showed a mild case of progression in his liver, stable multiple small on nodules. -I recommend him to change treatment, unfortunately his very limited options. He previous had a response to Xeloda, but did not tolerate it well. I recommend him try Xeloda cancer, with reduced dose to 1500 mg bid, 2 weeks on, one-week off. He agrees. Xeloda was reduced to one week on, 1 week off, due to the skin toxicity. -Lab results reviewed, mild thrombocytopenia, adequate for treatment, we'll proceed with Xeloda this week, he has started this morning. -Repeat staging CT scan in 3 weeks -If he has further disease with progression on Xeloda, I would consider PD-1 inhibitor and MEK inhibitor as next line therapy. It has showed encouraging response in [place] high stable metastatic colorectal cancers in the clinical trial, but has not been approved.   2. Microcytic anemia secondary to GI bleeding, iron deficiency and  chemotherapy -His serum iron level and saturation were low, although ferritin is normal, he has some degree of iron deficient anemia, and anemia of malignancy  -Consider blood transfusion if hemoglobin less than 7.5 or symptomatic anemia -he received IV feraheme 510 mg twice in 09/2014, repeated iron study was normal on 07/23/2015 -His anemia got worse after he started gemcitabine, required blood transfusion -I encouraged him to take a multivitamin with minerals  3.  Hypertension and DM  -He will continue follow-up with his primary care physician. -He has been off lisinopril lately due to borderline low blood pressure, his blood pressure is not elevated today, he will continue monitoring at home, and restart lisinopril if needed -continue close monitoring   4. Peripheral neuropathy, secondary to chemotherapy, G1 -His peripheral neuropathy, worse after Xeloda, especially with skin reactions, much improving now. -Continue Neurontin  200 mg 3 times a day -We'll continue observation. -Not improving; decrease dosage of Xeloda.   5. Goal of care discussion  -We previously discussed the incurable nature of his cancer, and the overall poor prognosis, especially if he does not have good response to chemotherapy or progress on chemo -The patient understands the goal of care is palliative. -I recommend DNR/DNI, he will think about it   Plan: -Lab reviewed, adequate for treatment. -Reduce dose of Xeloda. Refilled today. He would like [facility].  -Plan to repeat CT after 2 more cycles of Xeloda, week of 3/26 -I will see him back in 3-4 weeks, after his scan   I spent 25 minutes before his visit, more than 50% on face-to-face counseling.  This document serves as a record of services personally performed by Malachy Mood, MD. It was created on her behalf by [place] Casey, a trained medical scribe. The creation of this record is based on the scribe's personal observations and the  provider's statements to them. This document has been checked and approved by the attending provider.  I have reviewed the above documentation for accuracy and  completeness, and I agree with the above information.      Truitt Merle  08/05/2016

## 2016-08-05 ENCOUNTER — Other Ambulatory Visit (HOSPITAL_BASED_OUTPATIENT_CLINIC_OR_DEPARTMENT_OTHER): Payer: Medicare Other

## 2016-08-05 ENCOUNTER — Encounter: Payer: Self-pay | Admitting: Hematology

## 2016-08-05 ENCOUNTER — Ambulatory Visit (HOSPITAL_BASED_OUTPATIENT_CLINIC_OR_DEPARTMENT_OTHER): Payer: Medicare Other | Admitting: Hematology

## 2016-08-05 ENCOUNTER — Telehealth: Payer: Self-pay | Admitting: Hematology

## 2016-08-05 VITALS — BP 140/60 | HR 62 | Temp 97.5°F | Resp 18 | Ht 66.0 in | Wt 175.8 lb

## 2016-08-05 DIAGNOSIS — E119 Type 2 diabetes mellitus without complications: Secondary | ICD-10-CM

## 2016-08-05 DIAGNOSIS — D6481 Anemia due to antineoplastic chemotherapy: Secondary | ICD-10-CM

## 2016-08-05 DIAGNOSIS — C179 Malignant neoplasm of small intestine, unspecified: Secondary | ICD-10-CM | POA: Diagnosis not present

## 2016-08-05 DIAGNOSIS — D509 Iron deficiency anemia, unspecified: Secondary | ICD-10-CM

## 2016-08-05 DIAGNOSIS — I10 Essential (primary) hypertension: Secondary | ICD-10-CM | POA: Diagnosis not present

## 2016-08-05 DIAGNOSIS — D696 Thrombocytopenia, unspecified: Secondary | ICD-10-CM | POA: Diagnosis not present

## 2016-08-05 DIAGNOSIS — C787 Secondary malignant neoplasm of liver and intrahepatic bile duct: Secondary | ICD-10-CM

## 2016-08-05 DIAGNOSIS — D500 Iron deficiency anemia secondary to blood loss (chronic): Secondary | ICD-10-CM

## 2016-08-05 DIAGNOSIS — R911 Solitary pulmonary nodule: Secondary | ICD-10-CM | POA: Diagnosis not present

## 2016-08-05 DIAGNOSIS — G620 Drug-induced polyneuropathy: Secondary | ICD-10-CM

## 2016-08-05 DIAGNOSIS — T451X5A Adverse effect of antineoplastic and immunosuppressive drugs, initial encounter: Secondary | ICD-10-CM

## 2016-08-05 DIAGNOSIS — D630 Anemia in neoplastic disease: Secondary | ICD-10-CM | POA: Diagnosis not present

## 2016-08-05 LAB — COMPREHENSIVE METABOLIC PANEL
ALT: 14 U/L (ref 0–55)
ANION GAP: 8 meq/L (ref 3–11)
AST: 21 U/L (ref 5–34)
Albumin: 3.5 g/dL (ref 3.5–5.0)
Alkaline Phosphatase: 79 U/L (ref 40–150)
BILIRUBIN TOTAL: 0.54 mg/dL (ref 0.20–1.20)
BUN: 14.4 mg/dL (ref 7.0–26.0)
CALCIUM: 9.2 mg/dL (ref 8.4–10.4)
CHLORIDE: 105 meq/L (ref 98–109)
CO2: 28 mEq/L (ref 22–29)
CREATININE: 1 mg/dL (ref 0.7–1.3)
EGFR: 73 mL/min/{1.73_m2} — AB (ref >90)
Glucose: 92 mg/dl (ref 70–140)
Potassium: 3.7 mEq/L (ref 3.5–5.1)
Sodium: 141 mEq/L (ref 136–145)
TOTAL PROTEIN: 6.8 g/dL (ref 6.4–8.3)

## 2016-08-05 LAB — CBC WITH DIFFERENTIAL/PLATELET
BASO%: 0.4 % (ref 0.0–2.0)
Basophils Absolute: 0 10*3/uL (ref 0.0–0.1)
EOS ABS: 0.1 10*3/uL (ref 0.0–0.5)
EOS%: 2.3 % (ref 0.0–7.0)
HEMATOCRIT: 32.2 % — AB (ref 38.4–49.9)
HGB: 10.4 g/dL — ABNORMAL LOW (ref 13.0–17.1)
LYMPH#: 1.5 10*3/uL (ref 0.9–3.3)
LYMPH%: 26 % (ref 14.0–49.0)
MCH: 31.5 pg (ref 27.2–33.4)
MCHC: 32.3 g/dL (ref 32.0–36.0)
MCV: 97.4 fL (ref 79.3–98.0)
MONO#: 0.8 10*3/uL (ref 0.1–0.9)
MONO%: 14.4 % — ABNORMAL HIGH (ref 0.0–14.0)
NEUT#: 3.3 10*3/uL (ref 1.5–6.5)
NEUT%: 56.9 % (ref 39.0–75.0)
PLATELETS: 134 10*3/uL — AB (ref 140–400)
RBC: 3.31 10*6/uL — AB (ref 4.20–5.82)
RDW: 18.9 % — ABNORMAL HIGH (ref 11.0–14.6)
WBC: 5.8 10*3/uL (ref 4.0–10.3)

## 2016-08-05 MED ORDER — CAPECITABINE 500 MG PO TABS
ORAL_TABLET | ORAL | 1 refills | Status: DC
Start: 2016-08-05 — End: 2016-09-05

## 2016-08-05 NOTE — Telephone Encounter (Signed)
Appointments scheduled per 3/6 LOS. Patient given AVS report and calendars with future scheduled appointments. Patient also given two bottles of contrast and instructions for CT scan appointment.

## 2016-08-06 ENCOUNTER — Other Ambulatory Visit: Payer: Self-pay | Admitting: Oncology

## 2016-08-06 DIAGNOSIS — C179 Malignant neoplasm of small intestine, unspecified: Secondary | ICD-10-CM

## 2016-08-12 MED FILL — CAPECITABINE 500 MG TABLET: 500 | 28 days supply | Qty: 84 | Fill #0

## 2016-08-25 NOTE — Progress Notes (Signed)
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       08/18/2014 Miscellaneous    tumor KRAS mutation (-)      08/18/2014 Pathology Results    Liver biopsy showed metastatic adenocarcinoma, consistent with GI primary      08/18/2014 Initial Diagnosis    Small bowel cancer      08/18/2014 Procedure    small bowel enteroscopy with biopsy by Dr. Benson Norway showed at the proximal jejunum there was evidence of abnormal mucosa and friability, biopsy was obtained.       09/05/2014 Imaging    PET hypermetabolic mass involving a small bowel loop with adjacent mesenteric lymphadenopathy, and diffuse liver metastasis and mild hypermetabolic lymphadenopathy in porta hepatis.      09/06/2014 - 02/01/2015 Chemotherapy    mFOLFOX, stopped due to his neuropathy, and earlier mild disease progression on PET       09/07/2014 - 09/11/2014 Hospital Admission    He was admitted for fever and GI bleeding. Received 3 units of RBC.      10/26/2014 Imaging    Interval significant improvement in the primary small bowel mass, adjacent lymphadenopathy and extensive hepatic metastatic disease. No other new lesions.       02/09/2015 Imaging    PET/CT scan showed stable primary malignancy in the proximal jejunum, mild metabolic progression of the 3 residual metabolic liver metastasis. CT  Portion showed decreased size of his liver metastasis.      02/27/2015 - 11/06/2015 Chemotherapy    second line  chemo FOLFIRI, every 2 weeks, and panitumumab (held for second cycle due to severe skin rashes), chemotherapy stopped due to disease progression.      08/06/2015 Imaging    Stable number and size of the liver lesions. The left lower lobe nodule is considerably less prominent. No other new lesions.      11/26/2015 Progression    Restaging CT chest, abdomen and pelvis with contrast showed interval increase in size of liver lesions, multiple small pulmonary nodules are not significantly changed in the interval. Her tumor marker CEA also increased significantly      12/07/2015 - 01/16/2016 Chemotherapy    Gemcitabine 1000 mg/m2 (decreased to 800 mg/m from second dose), and a one and 8 every 21 days, stopped due to disease progression       02/12/2016 - 03/19/2016 Chemotherapy    Xeloda '1000mg'$ /m2 twice daily, 2 weeks on, one-week off, stopped after 2 cycles due to severe side effects (diarrhea, skin toxicities)      03/19/2016 - 03/21/2016 Hospital Admission    Patient was admitted for severe diarrhea, dehydration after second cycle of Xeloda, stoll c-diff was negative. He received IV fluids and supportive care.      06/17/2016 Imaging    CT Chest, Abdomen, Pelvis w/ Contrast  IMPRESSION: Mild increase in size of several hepatic metastases.  Stable sub-cm bilateral pulmonary metastases.  Interval resolution of diffuse colitis  since prior exam.  Incidentally noted aortic atherosclerosis, cholelithiasis, and mildly enlarged prostate.      HISTORY OF PRESENTING ILLNESS:  Tyler Evans 71 y.o. male is here because of a recent diagnosis of small bowel cancer. He presented to his PCP on 08/17/2014 with complaints of feeling weak and a one month history of a cough. He was found to be anemic and was referred to the emergency department. Hemoglobin was found to be 7.6, MCV 76.6; creatinine was elevated at 1.74. Stool was Hemoccult positive.  CT abdomen/pelvis without contrast on 08/17/2014  showed multiple large masses within the liver, bulky in appearance. The largest measured approximately 5.7 cm. There appeared to be a focal filling defect within the proximal jejunum measuring approximately 2.5 x 1.7 cm. There was mild adjacent jejunal wall thickening. The lung bases were clear.  On 08/18/2014 he underwent a small bowel enteroscopy with biopsy by Dr. Benson Norway. The esophagus and gastric lumen were normal. At the proximal jejunum there was evidence of abnormal mucosa and friability. The pediatric colonoscope was not able to traverse the area. Biopsies were obtained. An ultraslim colonoscope was then utilized. This colonoscope was able to traverse the area of stenosis which measured approximately 3 cm in length. 50% of the lumen was ulcerated. 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 MMR stains are pending.  He was discharged home on 08/19/2014.  CURRENT THERAPY: Xeloda '1500mg'$  bid, 7 days on and 7 days off   INTERIM HISTORY:  Mr. Ohlin returns for follow-up. Her Xeloda dose was reduced to 2 one-week on, one-week off about 4 weeks ago. He is tolerating this regimen much better, he still has some dry skin on the palm and bottom of feet, but no skin peeling or crackers. He does have some numbness and tingling on the fingers and toes, and function are most preserved. No other noticeable side effects. He has good appetite, energy level is decent, no pain or other complaints.  This is a white MEDICAL HISTORY:  Past Medical History:  Diagnosis Date  . Diabetes mellitus without complication ([place])   . Hypertension   . Small bowel cancer ([place]) 08/18/2014    SURGICAL HISTORY: Past Surgical History:  Procedure Laterality Date  . ENTEROSCOPY N/A 08/18/2014   Procedure: ENTEROSCOPY;  Surgeon: Carol Ada, MD;  Location: [facility];  Service: Endoscopy;  Laterality: N/A;    SOCIAL HISTORY: History   Social History  . Marital Status: Married     Spouse Name: N/A  . Number of Children: 2  . Years of Education: N/A   Occupational History  .  retired Ambulance person    Social History Main Topics  . Smoking status: Never Smoker   . Smokeless tobacco: Not on file  . Alcohol Use: No  . Drug Use: Not on file  . Sexual Activity: Not on file   Other Topics Concern  . Not on file   Social History Narrative  He lives in [place]. He is married. He has 2 children both reported to be in good health. No tobacco or alcohol use. He is a retired Ambulance person.  FAMILY HISTORY: Family History  Problem Relation Age of Onset  . Heart failure, Alzheimer's  Mother   . Lung cancer Father   . Kidney cancer Brother     ALLERGIES:  is allergic to penicillins.  MEDICATIONS:  Current Outpatient Prescriptions on File Prior to Visit  Medication Sig Dispense Refill  . atorvastatin (LIPITOR) 20 MG tablet  Take 20 mg by mouth every evening.     . capecitabine (XELODA) 500 MG tablet Take 3 tablets twice daily. 7 days on 7 days off. Start 07/22/16 84 tablet 1  . diphenoxylate-atropine (LOMOTIL) 2.5-0.025 MG tablet Take 2 tablets by mouth 4 (four) times daily as needed for diarrhea or loose stools. 60 tablet 0  . FREESTYLE LITE test strip AS DIRECTED ONCE A DAY TO CHECK BLOOD SUGARS IN VITRO 30 DAYS  12  . gabapentin (NEURONTIN) 100 MG capsule TAKE 2 CAPSULES BY MOUTH 3 TIMES DAILY 180 capsule 1  . LANTUS SOLOSTAR 100 UNIT/ML Solostar Pen Inject 20 Units into the skin every evening.     . lidocaine-prilocaine (EMLA) cream Apply topically as needed. Apply to portacath 1 1/2 hours - 2 hours prior to procedures as needed. (Patient taking differently: Apply 1 application topically daily as needed (port access). Apply to portacath 1 1/2 hours - 2 hours prior to procedures) 30 g 2  . lisinopril (PRINIVIL,ZESTRIL) 10 MG tablet Take 10 mg by mouth every evening.   6  . loperamide (IMODIUM) 2 MG capsule Take 4 mg by mouth 4 (four) times daily as needed for  diarrhea or loose stools.    . potassium chloride SA (KLOR-CON M20) 20 MEQ tablet Take 1 tablet (20 mEq total) by mouth daily. 30 tablet 1  . tamsulosin (FLOMAX) 0.4 MG CAPS capsule Take 0.4 mg by mouth every evening.   12   No current facility-administered medications on file prior to visit.   ;  REVIEW OF SYSTEMS:   Constitutional: Denies fevers, chills or abnormal night sweats; mild fatigue, weight is stable.  Eyes: Denies blurriness of vision, double vision (+) watery eyes Ears, nose, mouth, throat, and face: Denies mucositis or sore throat Respiratory: One month history of a nonproductive cough;  no shortness of breath Cardiovascular: Denies palpitation, chest discomfort or lower extremity swelling Gastrointestinal:  Denies nausea, heartburn. No dysphagia. He has noted less frequent bowel movements over the past 2 weeks. Does not characterize this as constipation. Skin: Denies abnormal skin rashes (+) red, cracking, sensitive skin on palms Lymphatics: Denies new lymphadenopathy or easy bruising Neurological: Denies extremity weakness (+) tingling and numbness in hands and feet Behavioral/Psych: Mood is stable, no new changes  All other systems were reviewed with the patient and are negative.  PHYSICAL EXAMINATION: ECOG PERFORMANCE STATUS: 1  Vitals:   08/27/16 1237  BP: (!) 122/47  Pulse: (!) 53  Resp: 18  Temp: 97.8 F (36.6 C)   Filed Weights   08/27/16 1237  Weight: 175 lb 1.6 oz (79.4 kg)   GENERAL:alert, no distress and comfortable SKIN: skin color, texture, turgor are normal, (+) skin pigmentation On his face, and hands, from her recent Xeloda. (+) Diffuse skin erythema on his palms and bottom of feet. Mild skin peeling on palms has improved.   EYES: normal, conjunctiva are pink and non-injected, sclera clear OROPHARYNX:no exudate, no erythema and lips, buccal mucosa, and tongue normal  NECK: supple, thyroid normal size, non-tender, without nodularity LYMPH:  no  palpable lymphadenopathy in the cervical, axillary or inguinal regions LUNGS: clear to auscultation and percussion with normal breathing effort HEART: regular rate & rhythm and no murmurs and no lower extremity edema ABDOMEN:abdomen soft, mild tenderness at the right upper quadrant , no rebound pain, normal bowel sounds Musculoskeletal:no cyanosis of digits and no clubbing  PSYCH: alert & oriented x 3 with fluent speech NEURO: no focal motor deficits, his light touch  sensation and vibration sensation are diminished on his hands and feet.   LABORATORY DATA:  I have reviewed the data as listed CBC Latest Ref Rng & Units 08/27/2016 08/05/2016 07/15/2016  WBC 4.0 - 10.3 10e3/uL 5.8 5.8 6.5  Hemoglobin 13.0 - 17.1 g/dL 9.7(L) 10.4(L) 10.5(L)  Hematocrit 38.4 - 49.9 % 29.8(L) 32.2(L) 32.4(L)  Platelets 140 - 400 10e3/uL 103(L) 134(L) 141    CMP Latest Ref Rng & Units 08/27/2016 08/05/2016 07/15/2016  Glucose 70 - 140 mg/dl 191(H) 92 173(H)  BUN 7.0 - 26.0 mg/dL 14.8 14.4 12.0  Creatinine 0.7 - 1.3 mg/dL 1.0 1.0 1.1  Sodium 136 - 145 mEq/L 140 141 141  Potassium 3.5 - 5.1 mEq/L 3.9 3.7 3.7  Chloride 101 - 111 mmol/L - - -  CO2 22 - 29 mEq/L '28 28 26  '$ Calcium 8.4 - 10.4 mg/dL 9.2 9.2 9.3  Total Protein 6.4 - 8.3 g/dL 6.4 6.8 6.6  Total Bilirubin 0.20 - 1.20 mg/dL 0.70 0.54 0.47  Alkaline Phos 40 - 150 U/L 77 79 84  AST 5 - 34 U/L '22 21 19  '$ ALT 0 - 55 U/L '15 14 11   '$ CEA: 08/31/2014: 189 12/12/2014: 9.0 03/27/2015: 5.0 05/29/2015: 17.6 07/09/2015: 41 11/26/2015: 153 01/30/2016: 523 02/29/2016: 985 03/31/2016: 295 04/29/2016: 244.69 05/27/2016: 347 06/24/2016: 427 07/15/2016: 709.32 08/27/2016: 862  Pathology report:  Small Intestine Biopsy, jejunal mass 08/18/2014 - INVASIVE ADENOCARCINOMA IN A BACKGROUND OF TUBULOVILLOUS ADENOMA. ADDITIONAL INFORMATION: Mismatch Repair (MMR) Protein Immunohistochemistry (IHC) IHC Expression Result (LIMITED TUMOR): MLH1: Preserved nuclear expression  (greater 50% tumor expression) MSH2: Preserved nuclear expression (greater 50% tumor expression) MSH6: Preserved nuclear expression (greater 50% tumor expression) PMS2: Preserved nuclear expression (greater 50% tumor expression) * Internal control demonstrates intact nuclear expression Interpretation: NORMAL There is preserved expression  Diagnosis 08/28/2014  Liver, needle/core biopsy, right lobe - METASTATIC ADENOCARCINOMA       RADIOGRAPHIC STUDIES: I have personally reviewed the radiological images as listed and agreed with the findings in the report.  CT chest abdomen pelvis with contrast 08/26/2016 IMPRESSION: 1. Similar pulmonary metastasis. 2. Similar to slight improvement in hepatic metastasis. 3. Enlargement of a gastrohepatic ligament node which is suspicious. Possible adenopathy at the root of the small bowel mesenteriy. Recommend attention on follow-up. 4.  Coronary artery atherosclerosis. Aortic atherosclerosis. 5. Gynecomastia. 6. Prostatomegaly. 7. Cholelithiasis. 8. Pulmonary artery enlargement suggests pulmonary arterial hypertension.   CT CAP w/ Contrast 06/17/16 IMPRESSION: Mild increase in size of several hepatic metastases. Stable sub-cm bilateral pulmonary metastases. Interval resolution of diffuse colitis since prior exam. Incidentally noted aortic atherosclerosis, cholelithiasis, and mildly enlarged prostate.   ASSESSMENT & PLAN:  71 y.o. gentleman with past medical history of diabetes, hypertension, who presents with anemia and weakness.  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  -Unfortunately he has stage IV disease with diffuse liver metastasis, this is an incurable disease, with overall very poor prognosis. -I previously reviewed his staging CT scan results  from 11/26/2015 with patient and his wife, Unfortunately scan showed disease progression in the liver, his tumor marker CA has been trending up lately, which is consistent with disease progression. -I previously discussed his [facility] testing results, which showed mutations in [place], RAF 1, APC, TP 53,  Targeted therapy is nt available at this point. -I previously discussed and reviewed his restaging CT images  from 01/28/2016 which showed disease progression in liver and lungs. I recommend him to stop gemcitabine -His tumor marker CEA has been trending up, corresponding to his disease progression -I have checked the clinical trial options at [facility] and [facility], unfortunately there is no suitable trials for him now.  -He has had multiple line chemo, currently on Xeloda with reduced dose to 1500 mg bid, 1 weeks on, one-week off. tolerating well --Reviewed staging CT scan from 08/26/2016, which showed improved liver metastasis, stable lung nodules. No other new lesions. -lab reviewed, adequate for treatment, we'll continue Xeloda. -If he has further disease with progression on Xeloda, I would consider PD-1 inhibitor and MEK inhibitor as next line therapy. It has showed encouraging response in MS high stable metastatic colorectal cancers in the clinical trial, but has not been approved.   2. Microcytic anemia secondary to GI bleeding, iron deficiency and chemotherapy -His serum iron level and saturation were low, although ferritin is normal, he has some degree of iron deficient anemia, and anemia of malignancy  -Consider blood transfusion if hemoglobin less than 7.5 or symptomatic anemia -he received IV feraheme 510 mg twice in 09/2014, repeated iron study was normal on 07/23/2015 -His anemia got worse after he started gemcitabine, required blood transfusion -I encouraged him to take a multivitamin with minerals  3.  Hypertension and DM  -He will continue follow-up with his primary care physician. -He has  been off lisinopril lately due to borderline low blood pressure, his blood pressure is not elevated today, he will continue monitoring at home, and restart lisinopril if needed -continue close monitoring   4. Peripheral neuropathy, secondary to chemotherapy, G1 -His peripheral neuropathy, worse after Xeloda, especially with skin reactions, much improving now. -Continue Neurontin  200 mg 3 times a day -We'll continue observation. -Not improving; decrease dosage of Xeloda.   5. Goal of care discussion  -We previously discussed the incurable nature of his cancer, and the overall poor prognosis, especially if he does not have good response to chemotherapy or progress on chemo -The patient understands the goal of care is palliative. -I recommend DNR/DNI, he will think about it   Plan: -Lab reviewed, adequate for treatment. -Continue Xeloda 1500 mg twice daily, one-week on, one-week off, he will start next cycle in one week (postpone for one week due to vacation) -I will see him back in 5 weeks.   I spent 25 minutes before his visit, more than 50% on face-to-face counseling.  I have reviewed the above documentation for accuracy and completeness, and I agree with the above information.      Truitt Merle  08/27/2016

## 2016-08-26 ENCOUNTER — Ambulatory Visit (HOSPITAL_COMMUNITY)
Admission: RE | Admit: 2016-08-26 | Discharge: 2016-08-26 | Disposition: A | Discharge status: Home / Self Care | Payer: Medicare Other | Source: Ambulatory Visit | Attending: Hematology | Admitting: Hematology

## 2016-08-26 DIAGNOSIS — N62 Hypertrophy of breast: Secondary | ICD-10-CM | POA: Diagnosis not present

## 2016-08-26 DIAGNOSIS — C787 Secondary malignant neoplasm of liver and intrahepatic bile duct: Secondary | ICD-10-CM | POA: Diagnosis not present

## 2016-08-26 DIAGNOSIS — K8020 Calculus of gallbladder without cholecystitis without obstruction: Secondary | ICD-10-CM | POA: Diagnosis not present

## 2016-08-26 DIAGNOSIS — N400 Enlarged prostate without lower urinary tract symptoms: Secondary | ICD-10-CM | POA: Diagnosis not present

## 2016-08-26 DIAGNOSIS — C7800 Secondary malignant neoplasm of unspecified lung: Secondary | ICD-10-CM | POA: Insufficient documentation

## 2016-08-26 DIAGNOSIS — I700 Atherosclerosis of aorta: Secondary | ICD-10-CM | POA: Diagnosis not present

## 2016-08-26 DIAGNOSIS — R590 Localized enlarged lymph nodes: Secondary | ICD-10-CM | POA: Diagnosis not present

## 2016-08-26 DIAGNOSIS — C179 Malignant neoplasm of small intestine, unspecified: Secondary | ICD-10-CM | POA: Insufficient documentation

## 2016-08-26 DIAGNOSIS — I7789 Other specified disorders of arteries and arterioles: Secondary | ICD-10-CM | POA: Diagnosis not present

## 2016-08-26 DIAGNOSIS — I2510 Atherosclerotic heart disease of native coronary artery without angina pectoris: Secondary | ICD-10-CM | POA: Diagnosis not present

## 2016-08-26 MED ORDER — IOPAMIDOL (ISOVUE-300) INJECTION 61%
INTRAVENOUS | Status: AC
  Administered 2016-08-26: 100 mL
  Filled 2016-08-26: qty 100

## 2016-08-27 ENCOUNTER — Other Ambulatory Visit: Payer: Medicare Other

## 2016-08-27 ENCOUNTER — Ambulatory Visit: Payer: Medicare Other

## 2016-08-27 ENCOUNTER — Encounter: Payer: Self-pay | Admitting: Hematology

## 2016-08-27 ENCOUNTER — Other Ambulatory Visit (HOSPITAL_BASED_OUTPATIENT_CLINIC_OR_DEPARTMENT_OTHER): Payer: Medicare Other

## 2016-08-27 ENCOUNTER — Telehealth: Payer: Self-pay | Admitting: Hematology

## 2016-08-27 ENCOUNTER — Ambulatory Visit (HOSPITAL_BASED_OUTPATIENT_CLINIC_OR_DEPARTMENT_OTHER): Payer: Medicare Other | Admitting: Hematology

## 2016-08-27 VITALS — BP 122/47 | HR 53 | Temp 97.8°F | Resp 18 | Ht 66.0 in | Wt 175.1 lb

## 2016-08-27 DIAGNOSIS — C787 Secondary malignant neoplasm of liver and intrahepatic bile duct: Secondary | ICD-10-CM

## 2016-08-27 DIAGNOSIS — G620 Drug-induced polyneuropathy: Secondary | ICD-10-CM | POA: Diagnosis not present

## 2016-08-27 DIAGNOSIS — R911 Solitary pulmonary nodule: Secondary | ICD-10-CM

## 2016-08-27 DIAGNOSIS — E119 Type 2 diabetes mellitus without complications: Secondary | ICD-10-CM | POA: Diagnosis not present

## 2016-08-27 DIAGNOSIS — Z95828 Presence of other vascular implants and grafts: Secondary | ICD-10-CM

## 2016-08-27 DIAGNOSIS — C179 Malignant neoplasm of small intestine, unspecified: Secondary | ICD-10-CM

## 2016-08-27 DIAGNOSIS — T451X5A Adverse effect of antineoplastic and immunosuppressive drugs, initial encounter: Secondary | ICD-10-CM

## 2016-08-27 DIAGNOSIS — Z5189 Encounter for other specified aftercare: Secondary | ICD-10-CM

## 2016-08-27 DIAGNOSIS — D630 Anemia in neoplastic disease: Secondary | ICD-10-CM

## 2016-08-27 DIAGNOSIS — I10 Essential (primary) hypertension: Secondary | ICD-10-CM

## 2016-08-27 DIAGNOSIS — D509 Iron deficiency anemia, unspecified: Secondary | ICD-10-CM

## 2016-08-27 DIAGNOSIS — D500 Iron deficiency anemia secondary to blood loss (chronic): Secondary | ICD-10-CM

## 2016-08-27 LAB — CBC WITH DIFFERENTIAL/PLATELET
BASO%: 0.3 % (ref 0.0–2.0)
Basophils Absolute: 0 10e3/uL (ref 0.0–0.1)
EOS%: 1.4 % (ref 0.0–7.0)
Eosinophils Absolute: 0.1 10e3/uL (ref 0.0–0.5)
HCT: 29.8 % — ABNORMAL LOW (ref 38.4–49.9)
HGB: 9.7 g/dL — ABNORMAL LOW (ref 13.0–17.1)
LYMPH%: 22.1 % (ref 14.0–49.0)
MCH: 30.9 pg (ref 27.2–33.4)
MCHC: 32.6 g/dL (ref 32.0–36.0)
MCV: 94.9 fL (ref 79.3–98.0)
MONO#: 0.5 10e3/uL (ref 0.1–0.9)
MONO%: 8.1 % (ref 0.0–14.0)
NEUT#: 3.9 10e3/uL (ref 1.5–6.5)
NEUT%: 68.1 % (ref 39.0–75.0)
Platelets: 103 10e3/uL — ABNORMAL LOW (ref 140–400)
RBC: 3.14 10e6/uL — ABNORMAL LOW (ref 4.20–5.82)
RDW: 16.8 % — ABNORMAL HIGH (ref 11.0–14.6)
WBC: 5.8 10e3/uL (ref 4.0–10.3)
lymph#: 1.3 10e3/uL (ref 0.9–3.3)

## 2016-08-27 LAB — COMPREHENSIVE METABOLIC PANEL
ALBUMIN: 3.3 g/dL — AB (ref 3.5–5.0)
ALK PHOS: 77 U/L (ref 40–150)
ALT: 15 U/L (ref 0–55)
ANION GAP: 8 meq/L (ref 3–11)
AST: 22 U/L (ref 5–34)
BUN: 14.8 mg/dL (ref 7.0–26.0)
CALCIUM: 9.2 mg/dL (ref 8.4–10.4)
CO2: 28 mEq/L (ref 22–29)
CREATININE: 1 mg/dL (ref 0.7–1.3)
Chloride: 104 mEq/L (ref 98–109)
EGFR: 76 mL/min/{1.73_m2} — ABNORMAL LOW (ref >90)
Glucose: 191 mg/dl — ABNORMAL HIGH (ref 70–140)
POTASSIUM: 3.9 meq/L (ref 3.5–5.1)
Sodium: 140 mEq/L (ref 136–145)
TOTAL PROTEIN: 6.4 g/dL (ref 6.4–8.3)
Total Bilirubin: 0.7 mg/dL (ref 0.20–1.20)

## 2016-08-27 LAB — CEA (IN HOUSE-CHCC): CEA (CHCC-In House): 862.2 ng/mL — ABNORMAL HIGH (ref 0.00–5.00)

## 2016-08-27 MED ORDER — SODIUM CHLORIDE 0.9 % IJ SOLN
10.0000 mL | INTRAMUSCULAR | Status: DC | PRN
  Administered 2016-08-27: 10 mL via INTRAVENOUS
  Filled 2016-08-27: qty 10

## 2016-08-27 MED ORDER — HEPARIN SOD (PORK) LOCK FLUSH 100 UNIT/ML IV SOLN
500.0000 [IU] | Freq: Once | INTRAVENOUS | Status: AC | PRN
  Administered 2016-08-27: 500 [IU] via INTRAVENOUS
  Filled 2016-08-27: qty 5

## 2016-08-27 NOTE — Telephone Encounter (Signed)
Gave patient AVS and calender per 08/27/2016 los.  

## 2016-09-05 ENCOUNTER — Other Ambulatory Visit: Payer: Self-pay | Admitting: Oncology

## 2016-09-05 ENCOUNTER — Other Ambulatory Visit: Payer: Self-pay | Admitting: *Deleted

## 2016-09-05 DIAGNOSIS — C179 Malignant neoplasm of small intestine, unspecified: Secondary | ICD-10-CM

## 2016-09-05 MED ORDER — CAPECITABINE 500 MG PO TABS: ORAL_TABLET | ORAL | 1 refills | Status: DC

## 2016-09-05 MED FILL — CAPECITABINE 500 MG TABLET: 500 | 28 days supply | Qty: 84 | Fill #0

## 2016-09-24 ENCOUNTER — Telehealth: Payer: Self-pay | Admitting: Hematology

## 2016-09-24 NOTE — Telephone Encounter (Signed)
Called patient to request moving his appointments from 5/2 to 5/1 per YF. Patient agreed.

## 2016-09-25 ENCOUNTER — Other Ambulatory Visit: Payer: Self-pay | Admitting: Medical Oncology

## 2016-09-25 DIAGNOSIS — T451X5A Adverse effect of antineoplastic and immunosuppressive drugs, initial encounter: Principal | ICD-10-CM

## 2016-09-25 DIAGNOSIS — C179 Malignant neoplasm of small intestine, unspecified: Secondary | ICD-10-CM

## 2016-09-25 DIAGNOSIS — G620 Drug-induced polyneuropathy: Secondary | ICD-10-CM

## 2016-09-25 MED ORDER — GABAPENTIN 100 MG PO CAPS: ORAL_CAPSULE | ORAL | 1 refills | Status: DC

## 2016-09-29 NOTE — Progress Notes (Signed)
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       08/18/2014 Miscellaneous    tumor KRAS mutation (-)      08/18/2014 Pathology Results    Small intestine biopsy showed metastatic adenocarcinoma, consistent with GI primary      08/18/2014 Initial Diagnosis    Small bowel cancer      08/18/2014 Procedure    small bowel enteroscopy with biopsy by Dr. Benson Norway showed at the proximal jejunum there was evidence of abnormal mucosa and friability, biopsy was obtained.       08/28/2014 Pathology Results    Diagnosis Liver, needle/core biopsy, right lobe - METASTATIC ADENOCARCINOMA        09/05/2014 Imaging    PET hypermetabolic mass involving a small bowel loop with adjacent mesenteric lymphadenopathy, and diffuse liver metastasis and mild hypermetabolic lymphadenopathy in porta hepatis.      09/06/2014 - 02/01/2015 Chemotherapy    mFOLFOX, stopped due to his neuropathy, and earlier mild disease progression on PET       09/07/2014 - 09/11/2014 Hospital Admission    He was admitted for fever and GI bleeding. Received 3 units of RBC.      10/26/2014 Imaging    Interval significant improvement in the primary small bowel mass, adjacent lymphadenopathy and extensive hepatic metastatic disease. No other new lesions.       02/09/2015 Imaging    PET/CT scan showed stable primary malignancy in the proximal jejunum, mild metabolic progression of the 3 residual metabolic liver  metastasis. CT  Portion showed decreased size of his liver metastasis.      02/27/2015 - 11/06/2015 Chemotherapy    second line chemo FOLFIRI, every 2 weeks, and panitumumab (held for second cycle due to severe skin rashes), chemotherapy stopped due to disease progression.      08/06/2015 Imaging    Stable number and size of the liver lesions. The left lower lobe nodule is considerably less prominent. No other new lesions.      11/26/2015 Progression    Restaging CT chest, abdomen and pelvis with contrast showed interval increase in size of liver lesions, multiple small pulmonary nodules are not significantly changed in the interval. Her tumor marker CEA also increased significantly      12/07/2015 - 01/16/2016 Chemotherapy    Gemcitabine 1000 mg/m2 (decreased to 800 mg/m from second dose), and a one and 8 every 21 days, stopped due to disease progression       02/12/2016 - 03/19/2016 Chemotherapy    Xeloda 1029m/m2 twice daily, 2 weeks on, one-week off, stopped after 2 cycles due to severe side effects (diarrhea, skin toxicities)      03/19/2016 - 03/21/2016 Hospital Admission    Patient was admitted for severe diarrhea, dehydration after second cycle of Xeloda, stoll c-diff was negative. He received IV fluids and supportive care.      06/17/2016 Imaging    CT Chest, Abdomen, Pelvis w/  Contrast  IMPRESSION: Mild increase in size of several hepatic metastases.  Stable sub-cm bilateral pulmonary metastases.  Interval resolution of diffuse colitis since prior exam.  Incidentally noted aortic atherosclerosis, cholelithiasis, and mildly enlarged prostate.      06/24/2016 -  Chemotherapy    Xeloda 1565m bid 2 weeks on, 1 weeks off, changed to 1 week on and 1 week off from cycle 2 due to tolerance issue      08/26/2016 Imaging     CT chest abdomen pelvis with contrast IMPRESSION: 1. Similar pulmonary metastasis. 2. Similar to slight improvement in hepatic metastasis. 3.  Enlargement of a gastrohepatic ligament node which is suspicious. Possible adenopathy at the root of the small bowel mesenteriy. Recommend attention on follow-up. 4.  Coronary artery atherosclerosis. Aortic atherosclerosis. 5. Gynecomastia. 6. Prostatomegaly. 7. Cholelithiasis. 8. Pulmonary artery enlargement suggests pulmonary arterial hypertension.       HISTORY OF PRESENTING ILLNESS: 12/12/15 Tyler Gal71y.o. male is here because of a recent diagnosis of small bowel cancer. He presented to his PCP on 08/17/2014 with complaints of feeling weak and a one month history of a cough. He was found to be anemic and was referred to the emergency department. Hemoglobin was found to be 7.6, MCV 76.6; creatinine was elevated at 1.74. Stool was Hemoccult positive.  CT abdomen/pelvis without contrast on 08/17/2014 showed multiple large masses within the liver, bulky in appearance. The largest measured approximately 5.7 cm. There appeared to be a focal filling defect within the proximal jejunum measuring approximately 2.5 x 1.7 cm. There was mild adjacent jejunal wall thickening. The lung bases were clear.  On 08/18/2014 he underwent a small bowel enteroscopy with biopsy by Dr. HBenson Norway The esophagus and gastric lumen were normal. At the proximal jejunum there was evidence of abnormal mucosa and friability. The pediatric colonoscope was not able to traverse the area. Biopsies were obtained. An ultraslim colonoscope was then utilized. This colonoscope was able to traverse the area of stenosis which measured approximately 3 cm in length. 50% of the lumen was ulcerated. 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 MMR stains are pending.  He was discharged home on 08/19/2014.  CURRENT THERAPY: Xeloda 15055mbid, 7 days on and 7 days off  INTERIM HISTORY:  Mr. RoGoinseturns for follow-up for his last day of 7 day cycle. He presents to the clinic today with his  wife. He still has mild hand-foot syndrome with dry skin on the palm and bottom of feet, but no skin peeling or crackers. This is manageable and overall stable. No significant impact on his hand function and walking. He reports tightness and tingling on hands and feet. He finds it harder to walk long distances but he is able to tolerate his current state. He denies pain and says he has regular daily BM. He reports good appetite.   This is a white MEDICAL HISTORY:  Past Medical History:  Diagnosis Date  . Diabetes mellitus without complication ([facility]  . Hypertension   . [place]    SURGICAL HISTORY: Past Surgical History:  Procedure Laterality Date  . ENTEROSCOPY N/A 08/18/2014   Procedure: ENTEROSCOPY;  Surgeon: PaCarol AdaMD;  Location: [facility];  Service: Endoscopy;  Laterality: N/A;    SOCIAL HISTORY: History   Social History  . Marital Status: Married    Spouse Name: N/A  . Number of Children: 2  . Years of Education: N/A   Occupational History  .  retired Ambulance person    Social History Main Topics  . Smoking status: Never Smoker   . Smokeless tobacco: Not on file  . Alcohol Use: No  . Drug Use: Not on file  . Sexual Activity: Not on file   Other Topics Concern  . Not on file   Social History Narrative  He lives in [place]. He is married. He has 2 children both reported to be in good health. No tobacco or alcohol use. He is a retired Ambulance person.  FAMILY HISTORY: Family History  Problem Relation Age of Onset  . Heart failure, Alzheimer's  Mother   . Lung cancer Father   . Kidney cancer Brother     ALLERGIES:  is allergic to penicillins.  MEDICATIONS:  Current Outpatient Prescriptions on File Prior to Visit  Medication Sig Dispense Refill  . atorvastatin (LIPITOR) 20 MG tablet Take 20 mg by mouth every evening.     . capecitabine (XELODA) 500 MG tablet Take 3 tablets twice daily. 7 days on 7 days off. Start 07/22/16 84  tablet 1  . FREESTYLE LITE test strip AS DIRECTED ONCE A DAY TO CHECK BLOOD SUGARS IN VITRO 30 DAYS  12  . gabapentin (NEURONTIN) 100 MG capsule TAKE 2 CAPSULES BY MOUTH 3 TIMES DAILY 180 capsule 1  . LANTUS SOLOSTAR 100 UNIT/ML Solostar Pen Inject 20 Units into the skin every evening.     . lidocaine-prilocaine (EMLA) cream Apply topically as needed. Apply to portacath 1 1/2 hours - 2 hours prior to procedures as needed. (Patient taking differently: Apply 1 application topically daily as needed (port access). Apply to portacath 1 1/2 hours - 2 hours prior to procedures) 30 g 2  . tamsulosin (FLOMAX) 0.4 MG CAPS capsule Take 0.4 mg by mouth every evening.   12  . diphenoxylate-atropine (LOMOTIL) 2.5-0.025 MG tablet Take 2 tablets by mouth 4 (four) times daily as needed for diarrhea or loose stools. (Patient not taking: Reported on 09/30/2016) 60 tablet 0  . loperamide (IMODIUM) 2 MG capsule Take 4 mg by mouth 4 (four) times daily as needed for diarrhea or loose stools.    . potassium chloride SA (KLOR-CON M20) 20 MEQ tablet Take 1 tablet (20 mEq total) by mouth daily. (Patient not taking: Reported on 09/30/2016) 30 tablet 1   No current facility-administered medications on file prior to visit.   ;  REVIEW OF SYSTEMS:   Constitutional: Denies fevers, chills or abnormal night sweats; mild fatigue, weight is stable.  Eyes: Denies blurriness of vision, double vision (+) watery eyes Ears, nose, mouth, throat, and face: Denies mucositis or sore throat Respiratory: One month history of a nonproductive cough;  no shortness of breath Cardiovascular: Denies palpitation, chest discomfort or lower extremity swelling Gastrointestinal:  Denies nausea, heartburn. No dysphagia. He has noted less frequent bowel movements over the past 2 weeks. Does not characterize this as constipation. Skin: Denies abnormal skin rashes (+) Dry skin, hyperpigmentation on palms (+) skin feels tight on hands and feet due to hand-feet  syndrome Lymphatics: Denies new lymphadenopathy or easy bruising Neurological: Denies extremity weakness (+) tingling and numbness in hands and feet Behavioral/Psych: Mood is stable, no new changes  All other systems were reviewed with the patient and are negative.  PHYSICAL EXAMINATION: ECOG PERFORMANCE STATUS: 1  Vitals:   09/30/16 0847  BP: 126/65  Pulse: 63  Resp: 18  Temp: 98.1 F (36.7 C)   Filed Weights   09/30/16 0847  Weight: 176 lb  9.6 oz (80.1 kg)   GENERAL:alert, no distress and comfortable SKIN: skin color, texture, turgor are normal, (+) skin pigmentation On his face, and hands, from her recent Xeloda. (+) Diffuse mild skin erythema on his palms and bottom of feet. No skin peeling or crackers on palms or bottoms of feet.   EYES: normal, conjunctiva are pink and non-injected, sclera clear OROPHARYNX:no exudate, no erythema and lips, buccal mucosa, and tongue normal  NECK: supple, thyroid normal size, non-tender, without nodularity LYMPH:  no palpable lymphadenopathy in the cervical, axillary or inguinal regions LUNGS: clear to auscultation and percussion with normal breathing effort HEART: regular rate & rhythm and no murmurs and no lower extremity edema ABDOMEN:abdomen soft, mild tenderness at the right upper quadrant , no rebound pain, normal bowel sounds Musculoskeletal:no cyanosis of digits and no clubbing  PSYCH: alert & oriented x 3 with fluent speech NEURO: no focal motor deficits, his light touch sensation and vibration sensation are diminished on his hands and feet.   LABORATORY DATA:  I have reviewed the data as listed CBC Latest Ref Rng & Units 09/30/2016 08/27/2016 08/05/2016  WBC 4.0 - 10.3 10e3/uL 6.3 5.8 5.8  Hemoglobin 13.0 - 17.1 g/dL 10.0(L) 9.7(L) 10.4(L)  Hematocrit 38.4 - 49.9 % 31.1(L) 29.8(L) 32.2(L)  Platelets 140 - 400 10e3/uL 108(L) 103(L) 134(L)    CMP Latest Ref Rng & Units 09/30/2016 08/27/2016 08/05/2016  Glucose 70 - 140 mg/dl 111 191(H)  92  BUN 7.0 - 26.0 mg/dL 12.8 14.8 14.4  Creatinine 0.7 - 1.3 mg/dL 0.9 1.0 1.0  Sodium 136 - 145 mEq/L 141 140 141  Potassium 3.5 - 5.1 mEq/L 3.8 3.9 3.7  Chloride 101 - 111 mmol/L - - -  CO2 22 - 29 mEq/L _0 Calcium 8.4 - 10.4 mg/dL 9.2 9.2 9.2  Total Protein 6.4 - 8.3 g/dL 6.5 6.4 6.8  Total Bilirubin 0.20 - 1.20 mg/dL 0.67 0.70 0.54  Alkaline Phos 40 - 150 U/L 93 77 79  AST 5 - 34 U/L _1 ALT 0 - 55 U/L _2 CEA: 08/31/2014: 189 12/12/2014: 9.0 03/27/2015: 5.0 05/29/2015: 17.6 07/09/2015: 41 11/26/2015: 153 01/30/2016: 523 02/29/2016: 985 03/31/2016: 295 04/29/2016: 244.69 05/27/2016: 347 06/24/2016: 427 07/15/2016: 709.32 08/27/2016: 862 09/30/16: PENDING  Pathology report:  Small Intestine Biopsy, jejunal mass 08/18/2014 - INVASIVE ADENOCARCINOMA IN A BACKGROUND OF TUBULOVILLOUS ADENOMA. ADDITIONAL INFORMATION: Mismatch Repair (MMR) Protein Immunohistochemistry (IHC) IHC Expression Result (LIMITED TUMOR): MLH1: Preserved nuclear expression (greater 50% tumor expression) MSH2: Preserved nuclear expression (greater 50% tumor expression) MSH6: Preserved nuclear expression (greater 50% tumor expression) PMS2: Preserved nuclear expression (greater 50% tumor expression) * Internal control demonstrates intact nuclear expression Interpretation: NORMAL There is preserved expression  Diagnosis 08/28/2014  Liver, needle/core biopsy, right lobe - METASTATIC ADENOCARCINOMA       RADIOGRAPHIC STUDIES: I have personally reviewed the radiological images as listed and agreed with the findings in the report.  CT chest abdomen pelvis with contrast 08/26/2016 IMPRESSION: 1. Similar pulmonary metastasis. 2. Similar to slight improvement in hepatic metastasis. 3. Enlargement of a gastrohepatic ligament node which is suspicious. Possible adenopathy at the root of the small bowel mesenteriy. Recommend attention on follow-up. 4.  Coronary artery atherosclerosis.  Aortic atherosclerosis. 5. Gynecomastia. 6. Prostatomegaly. 7. Cholelithiasis. 8. Pulmonary artery enlargement suggests pulmonary arterial hypertension.   CT CAP w/ Contrast 06/17/16 IMPRESSION: Mild increase in size of several hepatic metastases. Stable sub-cm bilateral pulmonary metastases. Interval resolution of diffuse  colitis since prior exam. Incidentally noted aortic atherosclerosis, cholelithiasis, and mildly enlarged prostate.   ASSESSMENT & PLAN:  71 y.o. gentleman with past medical history of diabetes, hypertension, who presents with anemia and weakness.  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  -Unfortunately he has stage IV disease with diffuse liver metastasis, this is an incurable disease, with overall very poor prognosis. -I previously reviewed his staging CT scan results from 11/26/2015 with patient and his wife, Unfortunately scan showed disease progression in the liver, his tumor marker CA has been trending up lately, which is consistent with disease progression. -I previously discussed his [facility] testing results, which showed mutations in [place], RAF 1, APC, TP 37,  Targeted therapy is nt available at this point. -I previously discussed and reviewed his restaging CT images from 01/28/2016 which showed disease progression in liver and lungs. I recommend him to stop gemcitabine -His tumor marker CEA has previously been trending up, corresponding to his disease progression -I have checked the clinical trial options at [facility] and [place], unfortunately there is no suitable trials for him now.  -He previously had multiple line chemo, currently on Xeloda with reduced dose to 1500 mg bid, 1 weeks on, one-week off. tolerating well --We previously Reviewed staging CT scan from 08/26/2016, which  showed improved liver metastasis, stable lung nodules. No other new lesions. -lab reviewed, adequate for treatment, we'll continue Xeloda. -If he has further disease with progression on Xeloda, I would consider PD-1 inhibitor and MEK inhibitor as next line therapy. It has showed encouraging response in MS-stable metastatic colorectal cancers in the clinical trial, but has not been approved.  -Tumor marker is slightly increasing. Will continue monitoring.  -plan to repeat CT scan in 7 weeks   2. Microcytic anemia secondary to GI bleeding, iron deficiency and chemotherapy -His previous serum iron level and saturation were low, although ferritin is normal, he has some degree of iron deficient anemia, and anemia of malignancy  -Consider blood transfusion if hemoglobin less than 7.5 or symptomatic anemia -he received IV feraheme 510 mg twice in 09/2014, repeated iron study was normal on 07/23/2015 -His anemia got worse after he started gemcitabine, required blood transfusion -I previously encouraged him to take a multivitamin with minerals -His is slightly anemic based on labs today (HBG 10 on 09/30/16)   3.  Hypertension and DM  -He will continue follow-up with his primary care physician. -He has been off lisinopril lately due to borderline low blood pressure, his blood pressure is not elevated today, he will continue monitoring at home, and restart lisinopril if needed -continue close monitoring   4. Peripheral neuropathy, secondary to chemotherapy, G1 -His peripheral neuropathy, worse after Xeloda, especially with skin reactions, much improving now. -Continue Neurontin  200 mg 3 times a day -We'll continue observation. -Not improving, no change since last visit; will continue same lowered dosage of Xeloda.  5. Goal of care discussion  -We previously discussed the incurable nature of his cancer, and the overall poor prognosis, especially if he does not have good response to chemotherapy or  progress on chemo -The patient understands the goal of care is palliative. -I recommend DNR/DNI, he will think about it  Plan: -Lab reviewed, mild anemia and thrombocytopenia, stable -f/u in 4 weeks, will order restaging CT scan on next visit  -will flush and repeat scan  at next visit -Continue Xeloda 1500 mg twice daily, one-week on, one-week off  I spent 25 minutes before his visit, more than 50% on face-to-face counseling.  I have reviewed the above documentation for accuracy and completeness, and I agree with the above information.      Truitt Merle  09/30/2016   This document serves as a record of services personally performed by Truitt Merle, MD. It was created on her behalf by Joslyn Devon, a trained medical scribe. The creation of this record is based on the scribe's personal observations and the provider's statements to them. This document has been checked and approved by the attending provider.

## 2016-09-30 ENCOUNTER — Telehealth: Payer: Self-pay | Admitting: Hematology

## 2016-09-30 ENCOUNTER — Other Ambulatory Visit (HOSPITAL_BASED_OUTPATIENT_CLINIC_OR_DEPARTMENT_OTHER): Payer: Medicare Other

## 2016-09-30 ENCOUNTER — Encounter: Payer: Self-pay | Admitting: Hematology

## 2016-09-30 ENCOUNTER — Ambulatory Visit (HOSPITAL_BASED_OUTPATIENT_CLINIC_OR_DEPARTMENT_OTHER): Payer: Medicare Other | Admitting: Hematology

## 2016-09-30 ENCOUNTER — Ambulatory Visit: Payer: Medicare Other

## 2016-09-30 VITALS — BP 126/65 | HR 63 | Temp 98.1°F | Resp 18 | Ht 66.0 in | Wt 176.6 lb

## 2016-09-30 DIAGNOSIS — G620 Drug-induced polyneuropathy: Secondary | ICD-10-CM

## 2016-09-30 DIAGNOSIS — D500 Iron deficiency anemia secondary to blood loss (chronic): Secondary | ICD-10-CM

## 2016-09-30 DIAGNOSIS — C787 Secondary malignant neoplasm of liver and intrahepatic bile duct: Secondary | ICD-10-CM

## 2016-09-30 DIAGNOSIS — Z95828 Presence of other vascular implants and grafts: Secondary | ICD-10-CM

## 2016-09-30 DIAGNOSIS — Z7189 Other specified counseling: Secondary | ICD-10-CM

## 2016-09-30 DIAGNOSIS — C772 Secondary and unspecified malignant neoplasm of intra-abdominal lymph nodes: Secondary | ICD-10-CM

## 2016-09-30 DIAGNOSIS — D509 Iron deficiency anemia, unspecified: Secondary | ICD-10-CM

## 2016-09-30 DIAGNOSIS — C179 Malignant neoplasm of small intestine, unspecified: Secondary | ICD-10-CM

## 2016-09-30 DIAGNOSIS — E119 Type 2 diabetes mellitus without complications: Secondary | ICD-10-CM

## 2016-09-30 DIAGNOSIS — R911 Solitary pulmonary nodule: Secondary | ICD-10-CM

## 2016-09-30 DIAGNOSIS — D630 Anemia in neoplastic disease: Secondary | ICD-10-CM

## 2016-09-30 DIAGNOSIS — I10 Essential (primary) hypertension: Secondary | ICD-10-CM

## 2016-09-30 DIAGNOSIS — D6481 Anemia due to antineoplastic chemotherapy: Secondary | ICD-10-CM

## 2016-09-30 DIAGNOSIS — T451X5A Adverse effect of antineoplastic and immunosuppressive drugs, initial encounter: Secondary | ICD-10-CM

## 2016-09-30 LAB — CBC WITH DIFFERENTIAL/PLATELET
BASO%: 0.4 % (ref 0.0–2.0)
Basophils Absolute: 0 10*3/uL (ref 0.0–0.1)
EOS%: 0.8 % (ref 0.0–7.0)
Eosinophils Absolute: 0.1 10*3/uL (ref 0.0–0.5)
HEMATOCRIT: 31.1 % — AB (ref 38.4–49.9)
HEMOGLOBIN: 10 g/dL — AB (ref 13.0–17.1)
LYMPH#: 1.1 10*3/uL (ref 0.9–3.3)
LYMPH%: 18.1 % (ref 14.0–49.0)
MCH: 31.1 pg (ref 27.2–33.4)
MCHC: 32.1 g/dL (ref 32.0–36.0)
MCV: 96.9 fL (ref 79.3–98.0)
MONO#: 0.5 10*3/uL (ref 0.1–0.9)
MONO%: 7.2 % (ref 0.0–14.0)
NEUT%: 73.5 % (ref 39.0–75.0)
NEUTROS ABS: 4.7 10*3/uL (ref 1.5–6.5)
Platelets: 108 10*3/uL — ABNORMAL LOW (ref 140–400)
RBC: 3.21 10*6/uL — ABNORMAL LOW (ref 4.20–5.82)
RDW: 17.4 % — ABNORMAL HIGH (ref 11.0–14.6)
WBC: 6.3 10*3/uL (ref 4.0–10.3)

## 2016-09-30 LAB — COMPREHENSIVE METABOLIC PANEL
ALBUMIN: 3.3 g/dL — AB (ref 3.5–5.0)
ALK PHOS: 93 U/L (ref 40–150)
ALT: 14 U/L (ref 0–55)
AST: 27 U/L (ref 5–34)
Anion Gap: 8 mEq/L (ref 3–11)
BUN: 12.8 mg/dL (ref 7.0–26.0)
CHLORIDE: 105 meq/L (ref 98–109)
CO2: 27 meq/L (ref 22–29)
Calcium: 9.2 mg/dL (ref 8.4–10.4)
Creatinine: 0.9 mg/dL (ref 0.7–1.3)
EGFR: 82 mL/min/{1.73_m2} — AB (ref >90)
GLUCOSE: 111 mg/dL (ref 70–140)
POTASSIUM: 3.8 meq/L (ref 3.5–5.1)
SODIUM: 141 meq/L (ref 136–145)
Total Bilirubin: 0.67 mg/dL (ref 0.20–1.20)
Total Protein: 6.5 g/dL (ref 6.4–8.3)

## 2016-09-30 LAB — CEA (IN HOUSE-CHCC): CEA (CHCC-In House): 1424.1 ng/mL — ABNORMAL HIGH (ref 0.00–5.00)

## 2016-09-30 MED ORDER — SODIUM CHLORIDE 0.9 % IJ SOLN
10.0000 mL | INTRAMUSCULAR | Status: DC | PRN
  Administered 2016-09-30: 10 mL via INTRAVENOUS
  Filled 2016-09-30: qty 10

## 2016-09-30 MED ORDER — HEPARIN SOD (PORK) LOCK FLUSH 100 UNIT/ML IV SOLN
500.0000 [IU] | Freq: Once | INTRAVENOUS | Status: AC | PRN
  Administered 2016-09-30: 500 [IU] via INTRAVENOUS
  Filled 2016-09-30: qty 5

## 2016-09-30 NOTE — Telephone Encounter (Signed)
Appointments scheduled per 09/30/16 los. Patient was given a copy of the AVS report and appointment schedule per 09/30/16 los. °

## 2016-10-01 ENCOUNTER — Ambulatory Visit: Payer: Medicare Other | Admitting: Hematology

## 2016-10-01 ENCOUNTER — Other Ambulatory Visit: Payer: Medicare Other

## 2016-10-08 ENCOUNTER — Other Ambulatory Visit: Payer: Self-pay | Admitting: *Deleted

## 2016-10-08 DIAGNOSIS — G620 Drug-induced polyneuropathy: Secondary | ICD-10-CM

## 2016-10-08 DIAGNOSIS — T451X5A Adverse effect of antineoplastic and immunosuppressive drugs, initial encounter: Principal | ICD-10-CM

## 2016-10-08 DIAGNOSIS — C179 Malignant neoplasm of small intestine, unspecified: Secondary | ICD-10-CM

## 2016-10-08 MED ORDER — GABAPENTIN 100 MG PO CAPS: ORAL_CAPSULE | ORAL | 1 refills | Status: DC

## 2016-10-14 MED FILL — CAPECITABINE 500 MG TABLET: 500 | 28 days supply | Qty: 84 | Fill #1

## 2016-10-23 NOTE — Progress Notes (Signed)
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       08/18/2014 Miscellaneous    tumor KRAS mutation (-)      08/18/2014 Pathology Results    Small intestine biopsy showed metastatic adenocarcinoma, consistent with GI primary      08/18/2014 Initial Diagnosis    Small bowel cancer      08/18/2014 Procedure    small bowel enteroscopy with biopsy by Dr. Benson Norway showed at the proximal jejunum there was evidence of abnormal mucosa and friability, biopsy was obtained.       08/28/2014 Pathology Results    Diagnosis Liver, needle/core biopsy, right lobe - METASTATIC ADENOCARCINOMA        09/05/2014 Imaging    PET hypermetabolic mass involving a small bowel loop with adjacent mesenteric lymphadenopathy, and diffuse liver metastasis and mild hypermetabolic lymphadenopathy in porta hepatis.      09/06/2014 - 02/01/2015 Chemotherapy    mFOLFOX, stopped due to his neuropathy, and earlier mild disease progression on PET       09/07/2014 - 09/11/2014 Hospital Admission    He was admitted for fever and GI bleeding. Received 3 units of RBC.      10/26/2014 Imaging    Interval significant improvement in the primary small bowel mass, adjacent lymphadenopathy and extensive hepatic metastatic disease. No other new lesions.       02/09/2015 Imaging    PET/CT scan showed stable primary malignancy in the proximal jejunum, mild metabolic progression of the 3 residual metabolic liver  metastasis. CT  Portion showed decreased size of his liver metastasis.      02/27/2015 - 11/06/2015 Chemotherapy    second line chemo FOLFIRI, every 2 weeks, and panitumumab (held for second cycle due to severe skin rashes), chemotherapy stopped due to disease progression.      08/06/2015 Imaging    Stable number and size of the liver lesions. The left lower lobe nodule is considerably less prominent. No other new lesions.      11/26/2015 Progression    Restaging CT chest, abdomen and pelvis with contrast showed interval increase in size of liver lesions, multiple small pulmonary nodules are not significantly changed in the interval. Her tumor marker CEA also increased significantly      12/07/2015 - 01/16/2016 Chemotherapy    Gemcitabine 1000 mg/m2 (decreased to 800 mg/m from second dose), and a one and 8 every 21 days, stopped due to disease progression       02/12/2016 - 03/19/2016 Chemotherapy    Xeloda 1056m/m2 twice daily, 2 weeks on, one-week off, stopped after 2 cycles due to severe side effects (diarrhea, skin toxicities)      03/19/2016 - 03/21/2016 Hospital Admission    Patient was admitted for severe diarrhea, dehydration after second cycle of Xeloda, stoll c-diff was negative. He received IV fluids and supportive care.      06/17/2016 Imaging    CT Chest, Abdomen, Pelvis w/  Contrast  IMPRESSION: Mild increase in size of several hepatic metastases.  Stable sub-cm bilateral pulmonary metastases.  Interval resolution of diffuse colitis since prior exam.  Incidentally noted aortic atherosclerosis, cholelithiasis, and mildly enlarged prostate.      06/24/2016 -  Chemotherapy    Xeloda 1565m bid 2 weeks on, 1 weeks off, changed to 1 week on and 1 week off from cycle 2 due to tolerance issue. Reduced to 5 days on and 9 days off due to sensitivity to Xeloda.       08/26/2016 Imaging     CT chest abdomen pelvis with contrast IMPRESSION: 1. Similar pulmonary  metastasis. 2. Similar to slight improvement in hepatic metastasis. 3. Enlargement of a gastrohepatic ligament node which is suspicious. Possible adenopathy at the root of the small bowel mesenteriy. Recommend attention on follow-up. 4.  Coronary artery atherosclerosis. Aortic atherosclerosis. 5. Gynecomastia. 6. Prostatomegaly. 7. Cholelithiasis. 8. Pulmonary artery enlargement suggests pulmonary arterial hypertension.       HISTORY OF PRESENTING ILLNESS: 12/12/15 Tyler Gal71y.o. male is here because of a recent diagnosis of small bowel cancer. He presented to his PCP on 08/17/2014 with complaints of feeling weak and a one month history of a cough. He was found to be anemic and was referred to the emergency department. Hemoglobin was found to be 7.6, MCV 76.6; creatinine was elevated at 1.74. Stool was Hemoccult positive.  CT abdomen/pelvis without contrast on 08/17/2014 showed multiple large masses within the liver, bulky in appearance. The largest measured approximately 5.7 cm. There appeared to be a focal filling defect within the proximal jejunum measuring approximately 2.5 x 1.7 cm. There was mild adjacent jejunal wall thickening. The lung bases were clear.  On 08/18/2014 he underwent a small bowel enteroscopy with biopsy by Dr. HBenson Norway The esophagus and gastric lumen were normal. At the proximal jejunum there was evidence of abnormal mucosa and friability. The pediatric colonoscope was not able to traverse the area. Biopsies were obtained. An ultraslim colonoscope was then utilized. This colonoscope was able to traverse the area of stenosis which measured approximately 3 cm in length. 50% of the lumen was ulcerated. 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 MMR stains are pending.  He was discharged home on 08/19/2014.  CURRENT THERAPY: Xeloda 15069mbid, 7 days on and 7 days off; changed to 5 days on and 9 days off on 10/24/2016 due to  cytopenia and skin toxicity.  INTERIM HISTORY:  Tyler Evans for follow-up. He presents to the clinic today with wife. He is not able to write but he can hold some things. He has tingling and numbness but manageable. His feet are the same and it feels as if he is walking on carpet. No reports of other issues. Appetite is pretty good and energy is fair. He does what he can around the house and he drives. He reports to have lost weight     This is a white MEDICAL HISTORY:  Past Medical History:  Diagnosis Date  . [place]  . Hypertension   . [place] bowel [place]    SURGICAL HISTORY: Past Surgical History:  Procedure Laterality Date  . ENTEROSCOPY N/A 08/18/2014   Procedure: ENTEROSCOPY;  Surgeon: PaCarol AdaMD;  Location: [facility];  Service: Endoscopy;  Laterality: N/A;    SOCIAL HISTORY: History   Social History  . Marital Status: Married    Spouse Name: N/A  . Number of  Children: 2  . Years of Education: N/A   Occupational History  .  retired Ambulance person    Social History Main Topics  . Smoking status: Never Smoker   . Smokeless tobacco: Not on file  . Alcohol Use: No  . Drug Use: Not on file  . Sexual Activity: Not on file   Other Topics Concern  . Not on file   Social History Narrative  He lives in [place]. He is married. He has 2 children both reported to be in good health. No tobacco or alcohol use. He is a retired Ambulance person.  FAMILY HISTORY: Family History  Problem Relation Age of Onset  . Heart failure, Alzheimer's  Mother   . Lung cancer Father   . Kidney cancer Brother     ALLERGIES:  is allergic to penicillins.  MEDICATIONS:  Current Outpatient Prescriptions on File Prior to Visit  Medication Sig Dispense Refill  . atorvastatin (LIPITOR) 20 MG tablet Take 20 mg by mouth every evening.     . capecitabine (XELODA) 500 MG tablet Take 3 tablets twice daily. 7 days on 7 days  off. Start 07/22/16 84 tablet 1  . FREESTYLE LITE test strip AS DIRECTED ONCE A DAY TO CHECK BLOOD SUGARS IN VITRO 30 DAYS  12  . gabapentin (NEURONTIN) 100 MG capsule TAKE 2 CAPSULES BY MOUTH 3 TIMES DAILY 180 capsule 1  . LANTUS SOLOSTAR 100 UNIT/ML Solostar Pen Inject 20 Units into the skin every evening.     . lidocaine-prilocaine (EMLA) cream Apply topically as needed. Apply to portacath 1 1/2 hours - 2 hours prior to procedures as needed. (Patient taking differently: Apply 1 application topically daily as needed (port access). Apply to portacath 1 1/2 hours - 2 hours prior to procedures) 30 g 2  . loperamide (IMODIUM) 2 MG capsule Take 4 mg by mouth 4 (four) times daily as needed for diarrhea or loose stools.    . tamsulosin (FLOMAX) 0.4 MG CAPS capsule Take 0.4 mg by mouth every evening.   12  . diphenoxylate-atropine (LOMOTIL) 2.5-0.025 MG tablet Take 2 tablets by mouth 4 (four) times daily as needed for diarrhea or loose stools. (Patient not taking: Reported on 09/30/2016) 60 tablet 0  . potassium chloride SA (KLOR-CON M20) 20 MEQ tablet Take 1 tablet (20 mEq total) by mouth daily. (Patient not taking: Reported on 09/30/2016) 30 tablet 1   No current facility-administered medications on file prior to visit.   ;  REVIEW OF SYSTEMS:   Constitutional: Denies fevers, chills or abnormal night sweats; mild fatigue.  (+) good appetite and fair energy (+) weight loss Eyes: Denies blurriness of vision, double vision (+) watery eyes Ears, nose, mouth, throat, and face: Denies mucositis or sore throat Respiratory: One month history of a nonproductive cough;  no shortness of breath Cardiovascular: Denies palpitation, chest discomfort or lower extremity swelling Gastrointestinal:  Denies nausea, heartburn. No dysphagia. He has noted less frequent bowel movements over the past 2 weeks. Does not characterize this as constipation. Skin: Denies abnormal skin rashes (+) Dry skin, hyperpigmentation on palms (+)  skin feels tight on hands and feet due to hand-feet syndrome Lymphatics: Denies new lymphadenopathy or easy bruising Neurological: Denies extremity weakness (+) increased  tingling and numbness in hands and feet Behavioral/Psych: Mood is stable, no new changes  All other systems were reviewed with the patient and are negative.  PHYSICAL EXAMINATION: ECOG PERFORMANCE STATUS: 1  Vitals:   10/29/16 1303  BP: (!) 134/53  Pulse: (!) 57  Resp: 18  Temp: 97.8 F (36.6 C)   Filed Weights   10/29/16 1303  Weight: 173 lb 11.2 oz (78.8 kg)     GENERAL:alert, no distress and comfortable SKIN: skin color, texture, turgor are normal, (+) skin pigmentation On his face, and hands, from her recent Xeloda. (+) Diffuse mild skin erythema on his palms and bottom of feet. No skin peeling or crackers on palms or bottoms of feet.   EYES: normal, conjunctiva are pink and non-injected, sclera clear OROPHARYNX:no exudate, no erythema and lips, buccal mucosa, and tongue normal  NECK: supple, thyroid normal size, non-tender, without nodularity LYMPH:  no palpable lymphadenopathy in the cervical, axillary or inguinal regions LUNGS: clear to auscultation and percussion with normal breathing effort HEART: regular rate & rhythm and no murmurs and no lower extremity edema ABDOMEN:abdomen soft, mild tenderness at the right upper quadrant , no rebound pain, normal bowel sounds Musculoskeletal:no cyanosis of digits and no clubbing  PSYCH: alert & oriented x 3 with fluent speech NEURO: no focal motor deficits, his light touch sensation and vibration sensation are diminished on his hands and feet.   LABORATORY DATA:  I have reviewed the data as listed CBC Latest Ref Rng & Units 10/29/2016 09/30/2016 08/27/2016  WBC 4.0 - 10.3 10e3/uL 6.6 6.3 5.8  Hemoglobin 13.0 - 17.1 g/dL 9.5(L) 10.0(L) 9.7(L)  Hematocrit 38.4 - 49.9 % 29.7(L) 31.1(L) 29.8(L)  Platelets 140 - 400 10e3/uL 89(L) 108(L) 103(L)    CMP Latest Ref Rng  & Units 10/29/2016 09/30/2016 08/27/2016  Glucose 70 - 140 mg/dl 132 111 191(H)  BUN 7.0 - 26.0 mg/dL 15.0 12.8 14.8  Creatinine 0.7 - 1.3 mg/dL 0.9 0.9 1.0  Sodium 136 - 145 mEq/L 140 141 140  Potassium 3.5 - 5.1 mEq/L 4.1 3.8 3.9  Chloride 101 - 111 mmol/L - - -  CO2 22 - 29 mEq/L _0 Calcium 8.4 - 10.4 mg/dL 9.0 9.2 9.2  Total Protein 6.4 - 8.3 g/dL 6.6 6.5 6.4  Total Bilirubin 0.20 - 1.20 mg/dL 0.69 0.67 0.70  Alkaline Phos 40 - 150 U/L 103 93 77  AST 5 - 34 U/L _1 ALT 0 - 55 U/L _2 CEA: 08/31/2014: 189 12/12/2014: 9.0 03/27/2015: 5.0 05/29/2015: 17.6 07/09/2015: 41 11/26/2015: 153 01/30/2016: 523 02/29/2016: 985 03/31/2016: 295 04/29/2016: 244.69 05/27/2016: 347 06/24/2016: 427 07/15/2016: 709.32 08/27/2016: 862 09/30/16: 1424.10 10/29/16: PENDING   Pathology report:  Small Intestine Biopsy, jejunal mass 08/18/2014 - INVASIVE ADENOCARCINOMA IN A BACKGROUND OF TUBULOVILLOUS ADENOMA. ADDITIONAL INFORMATION: Mismatch Repair (MMR) Protein Immunohistochemistry (IHC) IHC Expression Result (LIMITED TUMOR): MLH1: Preserved nuclear expression (greater 50% tumor expression) MSH2: Preserved nuclear expression (greater 50% tumor expression) MSH6: Preserved nuclear expression (greater 50% tumor expression) PMS2: Preserved nuclear expression (greater 50% tumor expression) * Internal control demonstrates intact nuclear expression Interpretation: NORMAL There is preserved expression  Diagnosis 08/28/2014  Liver, needle/core biopsy, right lobe - METASTATIC ADENOCARCINOMA       RADIOGRAPHIC STUDIES: I have personally reviewed the radiological images as listed and agreed with the findings in the report.  CT chest abdomen pelvis with contrast 08/26/2016 IMPRESSION: 1. Similar pulmonary metastasis. 2. Similar to slight improvement in hepatic metastasis. 3. Enlargement of a gastrohepatic ligament node which is suspicious. Possible adenopathy at the root of the small  bowel mesenteriy. Recommend attention on follow-up. 4.  Coronary artery atherosclerosis. Aortic atherosclerosis. 5. Gynecomastia. 6. Prostatomegaly. 7. Cholelithiasis. 8. Pulmonary artery enlargement  suggests pulmonary arterial hypertension.   CT CAP w/ Contrast 06/17/16 IMPRESSION: Mild increase in size of several hepatic metastases. Stable sub-cm bilateral pulmonary metastases. Interval resolution of diffuse colitis since prior exam. Incidentally noted aortic atherosclerosis, cholelithiasis, and mildly enlarged prostate.   ASSESSMENT & PLAN:  71 y.o. gentleman with past medical history of diabetes, hypertension, who presents with anemia and weakness.  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  -Unfortunately he has stage IV disease with diffuse liver metastasis, this is an incurable disease, with overall very poor prognosis. -I previously reviewed his staging CT scan results from 11/26/2015 with patient and his wife, Unfortunately scan showed disease progression in the liver, his tumor marker CA has been trending up lately, which is consistent with disease progression. -I previously discussed his [facility] testing results, which showed mutations in [place], RAF 1, APC, TP 73,  Targeted therapy is nt available at this point. -I previously discussed and reviewed his restaging CT images from 01/28/2016 which showed disease progression in liver and lungs. I recommend him to stop gemcitabine -His tumor marker CEA has previously been trending up, corresponding to his disease progression -I have checked the clinical trial options at [facility] and [place], unfortunately there is no suitable trials for him now.  -He previously had multiple line chemo, currently on Xeloda with reduced dose to 1500 mg bid, 1 weeks on,  one-week off. tolerating well --We previously Reviewed staging CT scan from 08/26/2016, which showed improved liver metastasis, stable lung nodules. No other new lesions. -lab reviewed, adequate for treatment, we'll continue Xeloda. -If he has further disease with progression on Xeloda, I would consider PD-1 inhibitor and MEK inhibitor as next line therapy. It has showed encouraging response in MS-stable metastatic colorectal cancers in the clinical trial, but has not been approved.  -Tumor marker is slightly increasing. Will continue monitoring.  -plan to repeat CT scan in 7 weeks  -labs reviewed, due to his thrombocytopenia and skin toxicity, I recommend change Xeloda to 5 days on and 9 days off.  -repeat CT scan before next visit.   2. Microcytic anemia secondary to GI bleeding, iron deficiency and chemotherapy -His previous serum iron level and saturation were low, although ferritin is normal, he has some degree of iron deficient anemia, and anemia of malignancy  -Consider blood transfusion if hemoglobin less than 7.5 or symptomatic anemia -he received IV feraheme 510 mg twice in 09/2014, repeated iron study was normal on 07/23/2015 -His anemia got worse after he started gemcitabine, required blood transfusion -I previously encouraged him to take a multivitamin with minerals -His is slightly anemic based on labs today (HBG 10 on 09/30/16)   3.  Hypertension and DM  -He will continue follow-up with his primary care physician. -He has been off lisinopril lately due to borderline low blood pressure, his blood pressure is not elevated today, he will continue monitoring at home, and restart lisinopril if needed -continue close monitoring   4. Peripheral neuropathy, secondary to chemotherapy, G1 -His peripheral neuropathy, worse after Xeloda, especially with skin reactions, much improving now. -Continue Neurontin  200 mg 3 times a day -We'll continue observation. -Not improving, no change since  last visit; will continue same lowered dosage of Xeloda. -Increased Tingling and numbness in hands and feet  5. Goal of care discussion  -We previously discussed the incurable nature  of his cancer, and the overall poor prognosis, especially if he does not have good response to chemotherapy or progress on chemo -The patient understands the goal of care is palliative. -I recommend DNR/DNI, he will think about it  Plan: -Lab reviewed, mild anemia and moderate thrombocytopenia --Continue Xeloda 1500 mg twice daily, change to 5 days on and 9 days off -F/u in 5 weeks 7/5  -Lab and CT CAP WITH contrast a few days before   I spent 25 minutes before his visit, more than 50% on face-to-face counseling.  I have reviewed the above documentation for accuracy and completeness, and I agree with the above information.      Truitt Merle  10/29/2016   This document serves as a record of services personally performed by Truitt Merle, MD. It was created on her behalf by Joslyn Devon, a trained medical scribe. The creation of this record is based on the scribe's personal observations and the provider's statements to them. This document has been checked and approved by the attending provider.

## 2016-10-29 ENCOUNTER — Other Ambulatory Visit (HOSPITAL_BASED_OUTPATIENT_CLINIC_OR_DEPARTMENT_OTHER): Payer: Medicare Other

## 2016-10-29 ENCOUNTER — Telehealth: Payer: Self-pay | Admitting: Hematology

## 2016-10-29 ENCOUNTER — Ambulatory Visit: Payer: Medicare Other

## 2016-10-29 ENCOUNTER — Ambulatory Visit (HOSPITAL_BASED_OUTPATIENT_CLINIC_OR_DEPARTMENT_OTHER): Payer: Medicare Other | Admitting: Hematology

## 2016-10-29 VITALS — BP 134/53 | HR 57 | Temp 97.8°F | Resp 18 | Ht 66.0 in | Wt 173.7 lb

## 2016-10-29 DIAGNOSIS — D500 Iron deficiency anemia secondary to blood loss (chronic): Secondary | ICD-10-CM

## 2016-10-29 DIAGNOSIS — R911 Solitary pulmonary nodule: Secondary | ICD-10-CM

## 2016-10-29 DIAGNOSIS — C179 Malignant neoplasm of small intestine, unspecified: Secondary | ICD-10-CM

## 2016-10-29 DIAGNOSIS — D630 Anemia in neoplastic disease: Secondary | ICD-10-CM | POA: Diagnosis not present

## 2016-10-29 DIAGNOSIS — C772 Secondary and unspecified malignant neoplasm of intra-abdominal lymph nodes: Secondary | ICD-10-CM

## 2016-10-29 DIAGNOSIS — Z7189 Other specified counseling: Secondary | ICD-10-CM

## 2016-10-29 DIAGNOSIS — I10 Essential (primary) hypertension: Secondary | ICD-10-CM | POA: Diagnosis not present

## 2016-10-29 DIAGNOSIS — C787 Secondary malignant neoplasm of liver and intrahepatic bile duct: Secondary | ICD-10-CM | POA: Diagnosis not present

## 2016-10-29 DIAGNOSIS — D509 Iron deficiency anemia, unspecified: Secondary | ICD-10-CM

## 2016-10-29 DIAGNOSIS — G620 Drug-induced polyneuropathy: Secondary | ICD-10-CM

## 2016-10-29 DIAGNOSIS — Z95828 Presence of other vascular implants and grafts: Secondary | ICD-10-CM

## 2016-10-29 DIAGNOSIS — D6481 Anemia due to antineoplastic chemotherapy: Secondary | ICD-10-CM

## 2016-10-29 DIAGNOSIS — E119 Type 2 diabetes mellitus without complications: Secondary | ICD-10-CM

## 2016-10-29 LAB — CBC WITH DIFFERENTIAL/PLATELET
BASO%: 0.2 % (ref 0.0–2.0)
Basophils Absolute: 0 10*3/uL (ref 0.0–0.1)
EOS ABS: 0.1 10*3/uL (ref 0.0–0.5)
EOS%: 1.1 % (ref 0.0–7.0)
HCT: 29.7 % — ABNORMAL LOW (ref 38.4–49.9)
HGB: 9.5 g/dL — ABNORMAL LOW (ref 13.0–17.1)
LYMPH%: 21.1 % (ref 14.0–49.0)
MCH: 30.3 pg (ref 27.2–33.4)
MCHC: 32 g/dL (ref 32.0–36.0)
MCV: 94.6 fL (ref 79.3–98.0)
MONO#: 0.5 10*3/uL (ref 0.1–0.9)
MONO%: 7.9 % (ref 0.0–14.0)
NEUT#: 4.6 10*3/uL (ref 1.5–6.5)
NEUT%: 69.7 % (ref 39.0–75.0)
PLATELETS: 89 10*3/uL — AB (ref 140–400)
RBC: 3.14 10*6/uL — AB (ref 4.20–5.82)
RDW: 16.5 % — ABNORMAL HIGH (ref 11.0–14.6)
WBC: 6.6 10*3/uL (ref 4.0–10.3)
lymph#: 1.4 10*3/uL (ref 0.9–3.3)

## 2016-10-29 LAB — COMPREHENSIVE METABOLIC PANEL
ALT: 13 U/L (ref 0–55)
ANION GAP: 7 meq/L (ref 3–11)
AST: 31 U/L (ref 5–34)
Albumin: 3.2 g/dL — ABNORMAL LOW (ref 3.5–5.0)
Alkaline Phosphatase: 103 U/L (ref 40–150)
BILIRUBIN TOTAL: 0.69 mg/dL (ref 0.20–1.20)
BUN: 15 mg/dL (ref 7.0–26.0)
CHLORIDE: 105 meq/L (ref 98–109)
CO2: 28 meq/L (ref 22–29)
Calcium: 9 mg/dL (ref 8.4–10.4)
Creatinine: 0.9 mg/dL (ref 0.7–1.3)
EGFR: 82 mL/min/{1.73_m2} — AB (ref >90)
Glucose: 132 mg/dl (ref 70–140)
POTASSIUM: 4.1 meq/L (ref 3.5–5.1)
SODIUM: 140 meq/L (ref 136–145)
Total Protein: 6.6 g/dL (ref 6.4–8.3)

## 2016-10-29 LAB — CEA (IN HOUSE-CHCC): CEA (CHCC-In House): 1884.61 ng/mL — ABNORMAL HIGH (ref 0.00–5.00)

## 2016-10-29 MED ORDER — HEPARIN SOD (PORK) LOCK FLUSH 100 UNIT/ML IV SOLN
500.0000 [IU] | Freq: Once | INTRAVENOUS | Status: AC | PRN
  Administered 2016-10-29: 500 [IU] via INTRAVENOUS
  Filled 2016-10-29: qty 5

## 2016-10-29 MED ORDER — SODIUM CHLORIDE 0.9 % IJ SOLN
10.0000 mL | INTRAMUSCULAR | Status: DC | PRN
  Administered 2016-10-29: 10 mL via INTRAVENOUS
  Filled 2016-10-29: qty 10

## 2016-10-29 NOTE — Telephone Encounter (Signed)
Gave patient AVS and calender per 5/30 los. Central radiology to contact patient with CT schedule.

## 2016-10-29 NOTE — Patient Instructions (Signed)

## 2016-10-31 ENCOUNTER — Encounter: Payer: Self-pay | Admitting: Hematology

## 2016-11-19 MED FILL — CAPECITABINE 500 MG TABLET: 500 | 28 days supply | Qty: 84 | Fill #1

## 2016-11-24 ENCOUNTER — Other Ambulatory Visit: Payer: Self-pay | Admitting: Hematology

## 2016-11-24 DIAGNOSIS — G620 Drug-induced polyneuropathy: Secondary | ICD-10-CM

## 2016-11-24 DIAGNOSIS — T451X5A Adverse effect of antineoplastic and immunosuppressive drugs, initial encounter: Principal | ICD-10-CM

## 2016-11-24 DIAGNOSIS — C179 Malignant neoplasm of small intestine, unspecified: Secondary | ICD-10-CM

## 2016-12-01 ENCOUNTER — Telehealth: Payer: Self-pay | Admitting: *Deleted

## 2016-12-01 NOTE — Telephone Encounter (Signed)
Received call from pt stating that he has not had a BM in several days & he discussed with on-call RN over w/e & was told to increase fiber with fruits/veggies, prune juice, & metamucil.  He still has not had a BM.  When questioned when he last had BM.  It was tues.  He states he has a CT tomorrow & can't drink the contrast the way he is feeling.  He states he eats a few bites & feels full & then feels nauseated.  Discussed with Dr Burr Medico & OK to try Mag Citrate & instructed to get & take ASAP & in no BM today to call back.  Instructed also that he probably needs to be on a stool softener & or laxative on a regular basis once bowels have moved.  OK to do CT tomorrow even if unable to drink contrast per Dr Burr Medico.  Pt expressed understanding.

## 2016-12-02 ENCOUNTER — Other Ambulatory Visit (HOSPITAL_BASED_OUTPATIENT_CLINIC_OR_DEPARTMENT_OTHER): Payer: Medicare Other

## 2016-12-02 ENCOUNTER — Ambulatory Visit (HOSPITAL_COMMUNITY)
Admission: RE | Admit: 2016-12-02 | Discharge: 2016-12-02 | Disposition: A | Discharge status: Home / Self Care | Payer: Medicare Other | Source: Ambulatory Visit | Attending: Hematology | Admitting: Hematology

## 2016-12-02 DIAGNOSIS — M5136 Other intervertebral disc degeneration, lumbar region: Secondary | ICD-10-CM | POA: Diagnosis not present

## 2016-12-02 DIAGNOSIS — K8020 Calculus of gallbladder without cholecystitis without obstruction: Secondary | ICD-10-CM | POA: Insufficient documentation

## 2016-12-02 DIAGNOSIS — N62 Hypertrophy of breast: Secondary | ICD-10-CM | POA: Diagnosis not present

## 2016-12-02 DIAGNOSIS — C179 Malignant neoplasm of small intestine, unspecified: Secondary | ICD-10-CM

## 2016-12-02 DIAGNOSIS — C7800 Secondary malignant neoplasm of unspecified lung: Secondary | ICD-10-CM | POA: Insufficient documentation

## 2016-12-02 DIAGNOSIS — K5730 Diverticulosis of large intestine without perforation or abscess without bleeding: Secondary | ICD-10-CM | POA: Diagnosis not present

## 2016-12-02 DIAGNOSIS — I700 Atherosclerosis of aorta: Secondary | ICD-10-CM | POA: Insufficient documentation

## 2016-12-02 DIAGNOSIS — M47816 Spondylosis without myelopathy or radiculopathy, lumbar region: Secondary | ICD-10-CM | POA: Insufficient documentation

## 2016-12-02 DIAGNOSIS — N400 Enlarged prostate without lower urinary tract symptoms: Secondary | ICD-10-CM | POA: Insufficient documentation

## 2016-12-02 DIAGNOSIS — C787 Secondary malignant neoplasm of liver and intrahepatic bile duct: Secondary | ICD-10-CM | POA: Diagnosis not present

## 2016-12-02 LAB — CBC WITH DIFFERENTIAL/PLATELET
BASO%: 0.5 % (ref 0.0–2.0)
BASOS ABS: 0 10*3/uL (ref 0.0–0.1)
EOS%: 0.8 % (ref 0.0–7.0)
Eosinophils Absolute: 0.1 10*3/uL (ref 0.0–0.5)
HEMATOCRIT: 33.7 % — AB (ref 38.4–49.9)
HGB: 10.8 g/dL — ABNORMAL LOW (ref 13.0–17.1)
LYMPH#: 1.1 10*3/uL (ref 0.9–3.3)
LYMPH%: 14.6 % (ref 14.0–49.0)
MCH: 29.7 pg (ref 27.2–33.4)
MCHC: 32 g/dL (ref 32.0–36.0)
MCV: 92.9 fL (ref 79.3–98.0)
MONO#: 0.6 10*3/uL (ref 0.1–0.9)
MONO%: 7.5 % (ref 0.0–14.0)
NEUT#: 6 10*3/uL (ref 1.5–6.5)
NEUT%: 76.6 % — AB (ref 39.0–75.0)
Platelets: 133 10*3/uL — ABNORMAL LOW (ref 140–400)
RBC: 3.62 10*6/uL — AB (ref 4.20–5.82)
RDW: 17.1 % — ABNORMAL HIGH (ref 11.0–14.6)
WBC: 7.8 10*3/uL (ref 4.0–10.3)

## 2016-12-02 LAB — COMPREHENSIVE METABOLIC PANEL
ALT: 18 U/L (ref 0–55)
AST: 40 U/L — AB (ref 5–34)
Albumin: 3.4 g/dL — ABNORMAL LOW (ref 3.5–5.0)
Alkaline Phosphatase: 122 U/L (ref 40–150)
Anion Gap: 12 mEq/L — ABNORMAL HIGH (ref 3–11)
BUN: 16.7 mg/dL (ref 7.0–26.0)
CALCIUM: 9.6 mg/dL (ref 8.4–10.4)
CHLORIDE: 101 meq/L (ref 98–109)
CO2: 28 meq/L (ref 22–29)
Creatinine: 1.1 mg/dL (ref 0.7–1.3)
EGFR: 67 mL/min/{1.73_m2} — ABNORMAL LOW (ref >90)
Glucose: 110 mg/dl (ref 70–140)
POTASSIUM: 3.9 meq/L (ref 3.5–5.1)
Sodium: 141 mEq/L (ref 136–145)
Total Bilirubin: 0.88 mg/dL (ref 0.20–1.20)
Total Protein: 7.2 g/dL (ref 6.4–8.3)

## 2016-12-02 LAB — CEA (IN HOUSE-CHCC)

## 2016-12-02 MED ORDER — IOPAMIDOL (ISOVUE-300) INJECTION 61%
100.0000 mL | Freq: Once | INTRAVENOUS | Status: AC | PRN
  Administered 2016-12-02: 100 mL via INTRAVENOUS

## 2016-12-02 MED ORDER — IOPAMIDOL (ISOVUE-300) INJECTION 61%
INTRAVENOUS | Status: AC
  Filled 2016-12-02: qty 100

## 2016-12-02 NOTE — Progress Notes (Signed)
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       08/18/2014 Miscellaneous    tumor KRAS mutation (-)      08/18/2014 Pathology Results    Small intestine biopsy showed metastatic adenocarcinoma, consistent with GI primary      08/18/2014 Initial Diagnosis    Small bowel cancer      08/18/2014 Procedure    small bowel enteroscopy with biopsy by Dr. Benson Norway showed at the proximal jejunum there was evidence of abnormal mucosa and friability, biopsy was obtained.       08/28/2014 Pathology Results    Diagnosis Liver, needle/core biopsy, right lobe - METASTATIC ADENOCARCINOMA        09/05/2014 Imaging    PET hypermetabolic mass involving a small bowel loop with adjacent mesenteric lymphadenopathy, and diffuse liver metastasis and mild hypermetabolic lymphadenopathy in porta hepatis.      09/06/2014 - 02/01/2015 Chemotherapy    mFOLFOX, stopped due to his neuropathy, and earlier mild disease progression on PET       09/07/2014 - 09/11/2014 Hospital Admission    He was admitted for fever and GI bleeding. Received 3 units of RBC.      10/26/2014 Imaging    Interval significant improvement in the primary small bowel mass, adjacent lymphadenopathy and extensive hepatic metastatic disease. No other new lesions.       02/09/2015 Imaging    PET/CT scan showed stable primary malignancy in the proximal jejunum, mild metabolic progression of the 3 residual metabolic liver  metastasis. CT  Portion showed decreased size of his liver metastasis.      02/27/2015 - 11/06/2015 Chemotherapy    second line chemo FOLFIRI, every 2 weeks, and panitumumab (held for second cycle due to severe skin rashes), chemotherapy stopped due to disease progression.      08/06/2015 Imaging    Stable number and size of the liver lesions. The left lower lobe nodule is considerably less prominent. No other new lesions.      11/26/2015 Progression    Restaging CT chest, abdomen and pelvis with contrast showed interval increase in size of liver lesions, multiple small pulmonary nodules are not significantly changed in the interval. Her tumor marker CEA also increased significantly      12/07/2015 - 01/16/2016 Chemotherapy    Gemcitabine 1000 mg/m2 (decreased to 800 mg/m from second dose), and a one and 8 every 21 days, stopped due to disease progression       02/12/2016 - 03/19/2016 Chemotherapy    Xeloda '1000mg'$ /m2 twice daily, 2 weeks on, one-week off, stopped after 2 cycles due to severe side effects (diarrhea, skin toxicities)      03/19/2016 - 03/21/2016 Hospital Admission    Patient was admitted for severe diarrhea, dehydration after second cycle of Xeloda, stoll c-diff was negative. He received IV fluids and supportive care.      06/17/2016 Imaging    CT Chest, Abdomen, Pelvis w/  Contrast  IMPRESSION: Mild increase in size of several hepatic metastases.  Stable sub-cm bilateral pulmonary metastases.  Interval resolution of diffuse colitis since prior exam.  Incidentally noted aortic atherosclerosis, cholelithiasis, and mildly enlarged prostate.      06/24/2016 -  Chemotherapy    Xeloda '1500mg'$  bid 2 weeks on, 1 weeks off, changed to 1 week on and 1 week off from cycle 2 due to tolerance issue. Reduced to 5 days on and 9 days off due to sensitivity to Xeloda.       08/26/2016 Imaging     CT chest abdomen pelvis with contrast IMPRESSION: 1. Similar pulmonary  metastasis. 2. Similar to slight improvement in hepatic metastasis. 3. Enlargement of a gastrohepatic ligament node which is suspicious. Possible adenopathy at the root of the small bowel mesenteriy. Recommend attention on follow-up. 4.  Coronary artery atherosclerosis. Aortic atherosclerosis. 5. Gynecomastia. 6. Prostatomegaly. 7. Cholelithiasis. 8. Pulmonary artery enlargement suggests pulmonary arterial hypertension.       12/02/2016 Imaging    CT Abdomen Pelvis w contrast 12/02/2016  IMPRESSION: 1. Enlarging hepatic and pulmonary metastatic lesions. Several small new metastatic lesions to the liver. 2. Enlarging infiltrating mesenteric mass in the left upper quadrant is causing partial obstruction of the duodenum, with resulting dilatation of the stomach and duodenum extending up to this point. This infiltrating mesenteric mass also partially encases the superior mesenteric artery. 3. Mild infrahilar and retroperitoneal adenopathy. 4.  Aortic Atherosclerosis (ICD10-I70.0). 5. Lumbar spondylosis and degenerative disc disease potentially causing impingement in the lower lumbar spine. 6. Cholelithiasis. 7. Mild prostatomegaly.      HISTORY OF PRESENTING ILLNESS: 12/12/15 [name] 71 y.o. male is here because of a recent diagnosis of small bowel cancer. He presented to his PCP on 08/17/2014 with complaints of feeling weak and a one month history of a cough. He was found to be anemic and was referred to the emergency department. Hemoglobin was found to be 7.6, MCV 76.6; creatinine was elevated at 1.74. Stool was Hemoccult positive.  CT abdomen/pelvis without contrast on 08/17/2014 showed multiple large masses within the liver, bulky in appearance. The largest measured approximately 5.7 cm. There appeared to be a focal filling defect within the proximal jejunum measuring approximately 2.5 x 1.7 cm. There was mild adjacent jejunal wall thickening. The lung bases were clear.  On  08/18/2014 he underwent a small bowel enteroscopy with biopsy by Dr. Benson Norway. The esophagus and gastric lumen were normal. At the proximal jejunum there was evidence of abnormal mucosa and friability. The pediatric colonoscope was not able to traverse the area. Biopsies were obtained. An ultraslim colonoscope was then utilized. This colonoscope was able to traverse the area of stenosis which measured approximately 3 cm in length. 50% of the lumen was ulcerated. 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 MMR stains are pending.  He was discharged home on 08/19/2014.  CURRENT THERAPY: pending Lonsurf   INTERIM HISTORY:  Mr. Och returns for follow-up and to discuss treatment change. This week wasn't good for him. After his last cycle, he has difficulty with bowel movements and experiences nausea and vomiting mostly liquid.Marland Kitchen He took magnesium to help. He feels bloated today but denies any cramping.Marland Kitchen He is passing a small amount of gas. He still has skin tightness on his hands due to Xeloda. He has lost 10 pounds since our last visit.   This is a white MEDICAL HISTORY:  Past Medical History:  Diagnosis Date  . Diabetes mellitus  without complication ([place])   . Hypertension   . Small bowel cancer ([place]) 08/18/2014    SURGICAL HISTORY: Past Surgical History:  Procedure Laterality Date  . ENTEROSCOPY N/A 08/18/2014   Procedure: ENTEROSCOPY;  Surgeon: Carol Ada, MD;  Location: [facility];  Service: Endoscopy;  Laterality: N/A;    SOCIAL HISTORY: History   Social History  . Marital Status: Married    Spouse Name: N/A  . Number of Children: 2  . Years of Education: N/A   Occupational History  .  retired Ambulance person    Social History Main Topics  . Smoking status: Never Smoker   . Smokeless tobacco: Not on file  . Alcohol Use: No  . Drug Use: Not on file  . Sexual Activity: Not on file   Other Topics Concern  . Not on file   Social  History Narrative  He lives in [place]. He is married. He has 2 children both reported to be in good health. No tobacco or alcohol use. He is a retired Ambulance person.  FAMILY HISTORY: Family History  Problem Relation Age of Onset  . Heart failure, Alzheimer's  Mother   . Lung cancer Father   . Kidney cancer Brother     ALLERGIES:  is allergic to penicillins.  MEDICATIONS:  Current Outpatient Prescriptions on File Prior to Visit  Medication Sig Dispense Refill  . atorvastatin (LIPITOR) 20 MG tablet Take 20 mg by mouth every evening.     . diphenoxylate-atropine (LOMOTIL) 2.5-0.025 MG tablet Take 2 tablets by mouth 4 (four) times daily as needed for diarrhea or loose stools. (Patient not taking: Reported on 09/30/2016) 60 tablet 0  . FREESTYLE LITE test strip AS DIRECTED ONCE A DAY TO CHECK BLOOD SUGARS IN VITRO 30 DAYS  12  . gabapentin (NEURONTIN) 100 MG capsule TAKE 2 CAPSULES BY MOUTH 3 TIMES DAILY 180 capsule 1  . gabapentin (NEURONTIN) 100 MG capsule TAKE 2 CAPSULES BY MOUTH 3 TIMES DAILY 180 capsule 1  . LANTUS SOLOSTAR 100 UNIT/ML Solostar Pen Inject 20 Units into the skin every evening.     . lidocaine-prilocaine (EMLA) cream Apply topically as needed. Apply to portacath 1 1/2 hours - 2 hours prior to procedures as needed. (Patient taking differently: Apply 1 application topically daily as needed (port access). Apply to portacath 1 1/2 hours - 2 hours prior to procedures) 30 g 2  . loperamide (IMODIUM) 2 MG capsule Take 4 mg by mouth 4 (four) times daily as needed for diarrhea or loose stools.    . potassium chloride SA (KLOR-CON M20) 20 MEQ tablet Take 1 tablet (20 mEq total) by mouth daily. (Patient not taking: Reported on 09/30/2016) 30 tablet 1  . tamsulosin (FLOMAX) 0.4 MG CAPS capsule Take 0.4 mg by mouth every evening.   12   No current facility-administered medications on file prior to visit.   ;  REVIEW OF SYSTEMS:   Constitutional: Denies fevers, chills or abnormal  night sweats; mild fatigue.  (+)bloated (+)weight loss Eyes: Denies blurriness of vision, double vision Ears, nose, mouth, throat, and face: Denies mucositis or sore throat Respiratory: One month history of a nonproductive cough;  no shortness of breath Cardiovascular: Denies palpitation, chest discomfort or lower extremity swelling Gastrointestinal:  Denies heartburn. No dysphagia. He has noted less frequent bowel movements over the past 2 weeks. Does not characterize this as constipation. (+)nausea Skin: Denies abnormal skin rashes (+) Dry skin, hyperpigmentation on palms Lymphatics: Denies new lymphadenopathy or easy bruising  Neurological: Denies extremity weakness  Behavioral/Psych: Mood is stable, no new changes  All other systems were reviewed with the patient and are negative.  PHYSICAL EXAMINATION:  ECOG PERFORMANCE STATUS: 1  Vitals:   12/04/16 0807  BP: 124/64  Pulse: 91  Resp: 18  Temp: 97.8 F (36.6 C)   Filed Weights   12/04/16 0807  Weight: 162 lb 12.8 oz (73.8 kg)     GENERAL:alert, no distress and comfortable SKIN: skin color, texture, turgor are normal, (+) skin pigmentation On his face, and hands, from her recent Xeloda. (+) Diffuse mild skin erythema on his palms and bottom of feet. No skin peeling or crackers on palms or bottoms of feet.   EYES: normal, conjunctiva are pink and non-injected, sclera clear OROPHARYNX:no exudate, no erythema and lips, buccal mucosa, and tongue normal  NECK: supple, thyroid normal size, non-tender, without nodularity LYMPH:  no palpable lymphadenopathy in the cervical, axillary or inguinal regions LUNGS: clear to auscultation and percussion with normal breathing effort HEART: regular rate & rhythm and no murmurs and no lower extremity edema ABDOMEN:abdomen soft, mild tenderness at the right upper quadrant , no rebound pain, normal bowel sounds MSK:no cyanosis of digits and no clubbing  PSYCH: alert & oriented x 3 with fluent  speech NEURO: no focal motor deficits, his light touch sensation and vibration sensation are diminished on his hands and feet.   LABORATORY DATA:  I have reviewed the data as listed CBC Latest Ref Rng & Units 12/02/2016 10/29/2016 09/30/2016  WBC 4.0 - 10.3 10e3/uL 7.8 6.6 6.3  Hemoglobin 13.0 - 17.1 g/dL 10.8(L) 9.5(L) 10.0(L)  Hematocrit 38.4 - 49.9 % 33.7(L) 29.7(L) 31.1(L)  Platelets 140 - 400 10e3/uL 133(L) 89(L) 108(L)    CMP Latest Ref Rng & Units 12/02/2016 10/29/2016 09/30/2016  Glucose 70 - 140 mg/dl 110 132 111  BUN 7.0 - 26.0 mg/dL 16.7 15.0 12.8  Creatinine 0.7 - 1.3 mg/dL 1.1 0.9 0.9  Sodium 136 - 145 mEq/L 141 140 141  Potassium 3.5 - 5.1 mEq/L 3.9 4.1 3.8  Chloride 101 - 111 mmol/L - - -  CO2 22 - 29 mEq/L '28 28 27  '$ Calcium 8.4 - 10.4 mg/dL 9.6 9.0 9.2  Total Protein 6.4 - 8.3 g/dL 7.2 6.6 6.5  Total Bilirubin 0.20 - 1.20 mg/dL 0.88 0.69 0.67  Alkaline Phos 40 - 150 U/L 122 103 93  AST 5 - 34 U/L 40(H) 31 27  ALT 0 - 55 U/L '18 13 14   '$ CEA: 08/31/2014: 189 12/12/2014: 9.0 03/27/2015: 5.0 05/29/2015: 17.6 07/09/2015: 41 11/26/2015: 153 01/30/2016: 523 02/29/2016: 985 03/31/2016: 295 04/29/2016: 244.69 05/27/2016: 347 06/24/2016: 427 07/15/2016: 709.32 08/27/2016: 862 09/30/16: 1424.10 10/29/16: 1884.61 12/02/2016: 2334.31  Pathology report:  Small Intestine Biopsy, jejunal mass 08/18/2014 - INVASIVE ADENOCARCINOMA IN A BACKGROUND OF TUBULOVILLOUS ADENOMA. ADDITIONAL INFORMATION: Mismatch Repair (MMR) Protein Immunohistochemistry (IHC) IHC Expression Result (LIMITED TUMOR): MLH1: Preserved nuclear expression (greater 50% tumor expression) MSH2: Preserved nuclear expression (greater 50% tumor expression) MSH6: Preserved nuclear expression (greater 50% tumor expression) PMS2: Preserved nuclear expression (greater 50% tumor expression) * Internal control demonstrates intact nuclear expression Interpretation: NORMAL There is preserved expression  Diagnosis 08/28/2014   Liver, needle/core biopsy, right lobe - METASTATIC ADENOCARCINOMA       RADIOGRAPHIC STUDIES: I have personally reviewed the radiological images as listed and agreed with the findings in the report.  CT Abdomen Pelvis w contrast 12/02/2016  IMPRESSION: 1. Enlarging hepatic and pulmonary metastatic lesions. Several small new metastatic  lesions to the liver. 2. Enlarging infiltrating mesenteric mass in the left upper quadrant is causing partial obstruction of the duodenum, with resulting dilatation of the stomach and duodenum extending up to this point. This infiltrating mesenteric mass also partially encases the superior mesenteric artery. 3. Mild infrahilar and retroperitoneal adenopathy. 4.  Aortic Atherosclerosis (ICD10-I70.0). 5. Lumbar spondylosis and degenerative disc disease potentially causing impingement in the lower lumbar spine. 6. Cholelithiasis. 7. Mild prostatomegaly.  CT chest abdomen pelvis with contrast 08/26/2016 IMPRESSION: 1. Similar pulmonary metastasis. 2. Similar to slight improvement in hepatic metastasis. 3. Enlargement of a gastrohepatic ligament node which is suspicious. Possible adenopathy at the root of the small bowel mesenteriy. Recommend attention on follow-up. 4.  Coronary artery atherosclerosis. Aortic atherosclerosis. 5. Gynecomastia. 6. Prostatomegaly. 7. Cholelithiasis. 8. Pulmonary artery enlargement suggests pulmonary arterial hypertension.   CT CAP w/ Contrast 06/17/16 IMPRESSION: Mild increase in size of several hepatic metastases. Stable sub-cm bilateral pulmonary metastases. Interval resolution of diffuse colitis since prior exam. Incidentally noted aortic atherosclerosis, cholelithiasis, and mildly enlarged prostate.   ASSESSMENT & PLAN:  71 y.o. gentleman with past medical history of diabetes, hypertension, who presents with anemia and weakness.  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  -Unfortunately he has stage IV disease with diffuse liver metastasis, this is an incurable disease, with overall very poor prognosis. -I previously reviewed his staging CT scan results from 11/26/2015 with patient and his wife, Unfortunately scan showed disease progression in the liver, his tumor marker CA has been trending up lately, which is consistent with disease progression. -I previously discussed his [facility] testing results, which showed mutations in [place], RAF 1, APC, TP 70,  Targeted therapy is nt available at this point. -I previously discussed and reviewed his restaging CT images from 01/28/2016 which showed disease progression in liver and lungs. I recommend him to stop gemcitabine -His tumor marker CEA has previously been trending up, corresponding to his disease progression -I have checked the clinical trial options at [facility] and [facility], unfortunately there is no suitable trials for him now.  -He previously had multiple line chemo, currently on Xeloda with reduced dose to 1500 mg bid, 1 weeks on, one-week off. tolerating well --We previously Reviewed staging CT scan from 08/26/2016, which showed improved liver metastasis, stable lung nodules. No other new lesions. -I reviewed his staging CT scan from 12/29/2016, which unfortunately showed significant disease progression in the liver and primary site. He has developed partial small bowel resection from the small bowel tumor, symptomatic with constipation and nausea.  - due to the SBO, I strongly encouraged him to change his diet to have less fiber  Low residue diet. I'll refer him to our dietitian Pamala Hurry. - I will talk with surgeon about surgical options for his small bowel obstruction, or J-tube placement for his nutrition if resection is not feasible. -Due to the significant disease  progression, I recommend him to stop Xeloda. He has had multiple line chemotherapy, treatment option is very limited at this point. I discussed other treatments including lonsurf, which has been approved for metastatic colon cancer. I discussed the side effects including fatigue, low blood counts and the benefits including the possibility of prolonging his life. He agrees to proceed.  - Stop Xeloda today - I will refill Zofran. - I strongly encouraged him to take stool softener regularly.  If he does not have a bowel movement for a few days he should take Magnesium  - I also strongly encouraged him to take nutritional supplements like Ensure. 2-3 a day. - f/u in 2 weeks  2. Small bowel obstruction -Secondary to his primary proximal small bowel tumor -I strongly recommend him to change his diet to rule residual diet, and supplement with nutritional supplements such as Glucerna -Follow-up of his dietitian -I'll discuss with surgeons about possibility of small bowel surgery, versus J-tube placement for nutrition    3. Microcytic anemia secondary to GI bleeding, iron deficiency and chemotherapy -His previous serum iron level and saturation were low, although ferritin is normal, he has some degree of iron deficient anemia, and anemia of malignancy  -Consider blood transfusion if hemoglobin less than 7.5 or symptomatic anemia -he received IV feraheme 510 mg twice in 09/2014, repeated iron study was normal on 07/23/2015 -His anemia got worse after he started gemcitabine, required blood transfusion -I previously encouraged him to take a multivitamin with minerals - Labs reviewed. He is slightly anemic today, no need for blood transfusion.  4.  Hypertension and DM  -He will continue follow-up with his primary care physician. -He has been off lisinopril lately due to borderline low blood pressure, his blood pressure is not elevated today, he will continue monitoring at home, and restart lisinopril if  needed -continue close monitoring   5. Peripheral neuropathy, hand-foot syndrome, secondary to chemotherapy -His peripheral neuropathy, worse after Xeloda, especially with skin reactions, much improving now. -Continue Neurontin  200 mg 3 times a day -We'll continue observation.   6. Goal of care discussion  -We again had discussed the incurable nature of his cancer, and the overall poor prognosis, especially now he has progressed through multiple lines of chemotherapy and his treatment option is very limited. -The patient understands the goal of care is palliative. -I recommend DNR/DNI, he agrees.   Plan: - stop Xeloda today - refill Zofran today - get Lonsurf approved, prescription was sent out today  -  I will get dietician to talk with him about the low residual diet - talk to surgeon about surgical options for his small bowel obstruction - f/u in 2 weeks  I spent 40 minutes before his visit, more than 50% on face-to-face counseling.  I have reviewed the above documentation for accuracy and completeness, and I agree with the above information.      Truitt Merle  12/04/2016   This document serves as a record of services personally performed by Truitt Merle, MD. It was created on her behalf by Joslyn Devon, a trained medical scribe. The creation of this record is based on the scribe's personal observations and the provider's statements to them. This document has been checked and approved by the attending provider.

## 2016-12-04 ENCOUNTER — Telehealth: Payer: Self-pay | Admitting: Pharmacy Technician

## 2016-12-04 ENCOUNTER — Telehealth: Payer: Self-pay | Admitting: Hematology

## 2016-12-04 ENCOUNTER — Telehealth: Payer: Self-pay | Admitting: Pharmacist

## 2016-12-04 ENCOUNTER — Ambulatory Visit (HOSPITAL_BASED_OUTPATIENT_CLINIC_OR_DEPARTMENT_OTHER): Payer: Medicare Other | Admitting: Hematology

## 2016-12-04 VITALS — BP 124/64 | HR 91 | Temp 97.8°F | Resp 18 | Ht 66.0 in | Wt 162.8 lb

## 2016-12-04 DIAGNOSIS — C787 Secondary malignant neoplasm of liver and intrahepatic bile duct: Secondary | ICD-10-CM | POA: Diagnosis not present

## 2016-12-04 DIAGNOSIS — Z7189 Other specified counseling: Secondary | ICD-10-CM

## 2016-12-04 DIAGNOSIS — K56699 Other intestinal obstruction unspecified as to partial versus complete obstruction: Secondary | ICD-10-CM

## 2016-12-04 DIAGNOSIS — Z66 Do not resuscitate: Secondary | ICD-10-CM

## 2016-12-04 DIAGNOSIS — R634 Abnormal weight loss: Secondary | ICD-10-CM

## 2016-12-04 DIAGNOSIS — G629 Polyneuropathy, unspecified: Secondary | ICD-10-CM

## 2016-12-04 DIAGNOSIS — D630 Anemia in neoplastic disease: Secondary | ICD-10-CM

## 2016-12-04 DIAGNOSIS — E119 Type 2 diabetes mellitus without complications: Secondary | ICD-10-CM

## 2016-12-04 DIAGNOSIS — C772 Secondary and unspecified malignant neoplasm of intra-abdominal lymph nodes: Secondary | ICD-10-CM

## 2016-12-04 DIAGNOSIS — D509 Iron deficiency anemia, unspecified: Secondary | ICD-10-CM

## 2016-12-04 DIAGNOSIS — D6481 Anemia due to antineoplastic chemotherapy: Secondary | ICD-10-CM

## 2016-12-04 DIAGNOSIS — T451X5A Adverse effect of antineoplastic and immunosuppressive drugs, initial encounter: Secondary | ICD-10-CM

## 2016-12-04 DIAGNOSIS — C179 Malignant neoplasm of small intestine, unspecified: Secondary | ICD-10-CM | POA: Diagnosis not present

## 2016-12-04 DIAGNOSIS — G620 Drug-induced polyneuropathy: Secondary | ICD-10-CM

## 2016-12-04 DIAGNOSIS — D500 Iron deficiency anemia secondary to blood loss (chronic): Secondary | ICD-10-CM

## 2016-12-04 DIAGNOSIS — I10 Essential (primary) hypertension: Secondary | ICD-10-CM | POA: Diagnosis not present

## 2016-12-04 DIAGNOSIS — L271 Localized skin eruption due to drugs and medicaments taken internally: Secondary | ICD-10-CM | POA: Diagnosis not present

## 2016-12-04 MED ORDER — TRIFLURIDINE-TIPIRACIL 20-8.19 MG PO TABS: ORAL_TABLET | ORAL | 2 refills | Status: DC

## 2016-12-04 MED ORDER — TRIFLURIDINE-TIPIRACIL 20-8.19 MG PO TABS: 35.0000 mg/m2 | ORAL_TABLET | Freq: Two times a day (BID) | ORAL | 2 refills | Status: DC

## 2016-12-04 MED ORDER — ONDANSETRON HCL 8 MG PO TABS: 8.0000 mg | ORAL_TABLET | Freq: Three times a day (TID) | ORAL | 0 refills | Status: AC | PRN

## 2016-12-04 MED FILL — ONDANSETRON HCL 8 MG TAB: 8 | 6 days supply | Qty: 20 | Fill #0

## 2016-12-04 NOTE — Telephone Encounter (Signed)
Oral Chemotherapy Pharmacist Encounter  Received new Lonsurf prescription for the treatment of metastatic colorectal cancer 12/02/16 labs reviewed, OK for treatment Current medication list in Epic assessed, no DDIs with Lonsurf identified  Prescription has been e-scribed to the [facility] for benefits analysis and approval.  Oral oncology Clinic will continue to follow for prior authorization, copayment, initial counseling, and start date.  Johny Drilling, PharmD, BCPS, BCOP 12/04/2016  11:20 AM Oral Oncology Clinic [phone]

## 2016-12-04 NOTE — Telephone Encounter (Signed)
Scheduled appt per 7/5 los - Gave patient AVS and calender per los.  

## 2016-12-04 NOTE — Telephone Encounter (Signed)
Oral Oncology Patient Advocate Encounter  Received notification from [place] that prior authorization was required for [place].    The request was submitted on Cover My Meds and the prior authorization for Frankey Poot has been approved.    PA# [id number] Effective dates: 12/04/2016 through 06/01/2017.   Oral Oncology Clinic will continue to follow.   Fabio Asa. Melynda Keller, [place] Oral Oncology Patient Advocate [phone] 12/04/2016 4:20 PM

## 2016-12-05 ENCOUNTER — Encounter: Payer: Self-pay | Admitting: Hematology

## 2016-12-05 NOTE — Telephone Encounter (Signed)
Oral Chemotherapy Pharmacist Encounter  Prior authorization for Lonsurf approved by [employer] Lonsurf copayment [phone], this is prohibitively expensive for patient. We will start application for manufacturer assistance from [facility]. Patient will bring in proof of income documents and sign manufacturer application on Monday 7/9. Manufacturer application will be updated in a separate encounter.  Oral Oncology Clinic will continue to follow for initial counseling and start date.  Johny Drilling, PharmD, BCPS, BCOP 12/05/2016  10:27 AM Oral Oncology Clinic [phone]

## 2016-12-08 ENCOUNTER — Telehealth: Payer: Self-pay | Admitting: Pharmacy Technician

## 2016-12-08 NOTE — Telephone Encounter (Signed)
Oral Oncology Patient Advocate Encounter  Met patient in lobby to complete application for [employer] Patient Support in an effort to reduce patient's out of pocket expense for Lonsurf to $0.    Application completed and faxed to [phone].   [employer] patient assistance phone number for follow up is [phone].   This encounter will be updated until final determination.   Fabio Asa. Melynda Keller, [facility] [phone] 12/08/2016 1:04 PM

## 2016-12-10 ENCOUNTER — Other Ambulatory Visit: Payer: Self-pay | Admitting: Hematology

## 2016-12-10 ENCOUNTER — Telehealth: Payer: Self-pay | Admitting: Hematology

## 2016-12-10 DIAGNOSIS — C179 Malignant neoplasm of small intestine, unspecified: Secondary | ICD-10-CM

## 2016-12-10 NOTE — Telephone Encounter (Signed)
Scheduled appt per Ernestene Kiel - for nutrition consult - patient is aware of appt date and time.

## 2016-12-11 NOTE — Telephone Encounter (Signed)
Oral Oncology Patient Advocate Encounter  Received notification from [place] Patient Assistance program that patient has been successfully enrolled into their program to receive Lonsurf from the manufacturer at $0 out of pocket until 06/01/2017.   I called and spoke with patient.  [place] Patient Support had already been in contact with him regarding the approval and next steps.   He knows we will have to re-apply in 2019.   Patient knows to call the office with questions or concerns.  Oral Oncology Clinic will continue to follow.  Gilmore Laroche, CPhT, [place] Oral Oncology Patient Advocate [phone] 12/11/2016 10:25 AM

## 2016-12-12 ENCOUNTER — Ambulatory Visit: Payer: Medicare Other | Admitting: Nutrition

## 2016-12-12 ENCOUNTER — Telehealth: Payer: Self-pay | Admitting: *Deleted

## 2016-12-12 NOTE — Progress Notes (Signed)
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  Past medical history includes hypertension and diabetes.  Medications include Lipitor, Lantus, Imodium, Zofran.  Labs include glucose 110, and albumin 3.4 on July 3.  Height: 5 feet 6 inches. Weight: 162.8 pounds. Usual body weight: 174.6 pounds January 2018 BMI: 26.28.  Patient has been told to follow a low residue diet secondary to partial bowel obstruction.  Nutrition diagnosis:  Food and nutrition related knowledge deficit related to small bowel cancer as evidenced by no prior need for nutrition related information.  Intervention: Patient was educated on low fiber diet and provided with detailed fact sheet. Encouraged patient to eat smaller more frequent meals, and chew his food well. Patient can continue high-protein ensure for additional protein and calories. Provided coupons and contact information. Questions were answered.  Teach back method used.  Monitoring, evaluation, goals: Patient will tolerate low fiber diet without difficulty. Food and nutrition related knowledge deficit resolved.  No follow-up required at this time.  **Disclaimer: This note was dictated with voice recognition software. Similar sounding words can inadvertently be transcribed and this note may contain transcription errors which may not have been corrected upon publication of note.**

## 2016-12-12 NOTE — Telephone Encounter (Signed)
Spoke with pt and instructed pt to start [place] on  Monday  12/15/16 - 12/19/16.   Informed pt that office visit will be rescheduled to a week later - week of July 23.   Pt voiced understanding.  Schedule message sent. Above instructions are from Dr. Burr Medico.

## 2016-12-12 NOTE — Telephone Encounter (Signed)
Pt called requesting a call back from nurse.   Spoke with pt , and was informed that he will receive shipment of [place] on Saturday  12/13/16.   Pt would like to know when he is supposed to start taking [place]. Pt's    Phone     [phone].

## 2016-12-13 ENCOUNTER — Telehealth: Payer: Self-pay | Admitting: Hematology

## 2016-12-13 NOTE — Telephone Encounter (Signed)
sw pt to confirm r/s appt to 7/27 at 1215 per sch msg

## 2016-12-18 ENCOUNTER — Ambulatory Visit: Payer: Medicare Other | Admitting: Hematology

## 2016-12-18 ENCOUNTER — Other Ambulatory Visit: Payer: Medicare Other

## 2016-12-25 NOTE — Progress Notes (Addendum)
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       08/18/2014 Miscellaneous    tumor KRAS mutation (-)      08/18/2014 Pathology Results    Small intestine biopsy showed metastatic adenocarcinoma, consistent with GI primary      08/18/2014 Initial Diagnosis    Small bowel cancer      08/18/2014 Procedure    small bowel enteroscopy with biopsy by Dr. Benson Norway showed at the proximal jejunum there was evidence of abnormal mucosa and friability, biopsy was obtained.       08/28/2014 Pathology Results    Diagnosis Liver, needle/core biopsy, right lobe - METASTATIC ADENOCARCINOMA        09/05/2014 Imaging    PET hypermetabolic mass involving a small bowel loop with adjacent mesenteric lymphadenopathy, and diffuse liver metastasis and mild hypermetabolic lymphadenopathy in porta hepatis.      09/06/2014 - 02/01/2015 Chemotherapy    mFOLFOX, stopped due to his neuropathy, and earlier mild disease progression on PET       09/07/2014 - 09/11/2014 Hospital Admission    He was admitted for fever and GI bleeding. Received 3 units of RBC.      10/26/2014 Imaging    Interval significant improvement in the primary small bowel mass, adjacent lymphadenopathy and extensive hepatic metastatic disease. No other new lesions.       02/09/2015 Imaging    PET/CT scan showed stable primary malignancy in the proximal jejunum, mild metabolic progression of the 3 residual metabolic liver  metastasis. CT  Portion showed decreased size of his liver metastasis.      02/27/2015 - 11/06/2015 Chemotherapy    second line chemo FOLFIRI, every 2 weeks, and panitumumab (held for second cycle due to severe skin rashes), chemotherapy stopped due to disease progression.      08/06/2015 Imaging    Stable number and size of the liver lesions. The left lower lobe nodule is considerably less prominent. No other new lesions.      11/26/2015 Progression    Restaging CT chest, abdomen and pelvis with contrast showed interval increase in size of liver lesions, multiple small pulmonary nodules are not significantly changed in the interval. Her tumor marker CEA also increased significantly      12/07/2015 - 01/16/2016 Chemotherapy    Gemcitabine 1000 mg/m2 (decreased to 800 mg/m from second dose), and a one and 8 every 21 days, stopped due to disease progression       02/12/2016 - 03/19/2016 Chemotherapy    Xeloda '1000mg'$ /m2 twice daily, 2 weeks on, one-week off, stopped after 2 cycles due to severe side effects (diarrhea, skin toxicities)      03/19/2016 - 03/21/2016 Hospital Admission    Patient was admitted for severe diarrhea, dehydration after second cycle of Xeloda, stoll c-diff was negative. He received IV fluids and supportive care.      06/17/2016 Imaging    CT Chest, Abdomen, Pelvis w/  Contrast  IMPRESSION: Mild increase in size of several hepatic metastases.  Stable sub-cm bilateral pulmonary metastases.  Interval resolution of diffuse colitis since prior exam.  Incidentally noted aortic atherosclerosis, cholelithiasis, and mildly enlarged prostate.      06/24/2016 -  Chemotherapy    Xeloda '1500mg'$  bid 2 weeks on, 1 weeks off, changed to 1 week on and 1 week off from cycle 2 due to tolerance issue. Reduced to 5 days on and 9 days off due to sensitivity to Xeloda.       08/26/2016 Imaging     CT chest abdomen pelvis with contrast IMPRESSION: 1. Similar pulmonary  metastasis. 2. Similar to slight improvement in hepatic metastasis. 3. Enlargement of a gastrohepatic ligament node which is suspicious. Possible adenopathy at the root of the small bowel mesenteriy. Recommend attention on follow-up. 4.  Coronary artery atherosclerosis. Aortic atherosclerosis. 5. Gynecomastia. 6. Prostatomegaly. 7. Cholelithiasis. 8. Pulmonary artery enlargement suggests pulmonary arterial hypertension.       12/02/2016 Imaging    CT Abdomen Pelvis w contrast 12/02/2016  IMPRESSION: 1. Enlarging hepatic and pulmonary metastatic lesions. Several small new metastatic lesions to the liver. 2. Enlarging infiltrating mesenteric mass in the left upper quadrant is causing partial obstruction of the duodenum, with resulting dilatation of the stomach and duodenum extending up to this point. This infiltrating mesenteric mass also partially encases the superior mesenteric artery. 3. Mild infrahilar and retroperitoneal adenopathy. 4.  Aortic Atherosclerosis (ICD10-I70.0). 5. Lumbar spondylosis and degenerative disc disease potentially causing impingement in the lower lumbar spine. 6. Cholelithiasis. 7. Mild prostatomegaly.      HISTORY OF PRESENTING ILLNESS: 12/12/15 Tyler Evans 71 y.o. male is here because of a recent diagnosis of small bowel cancer. He presented to his PCP on 08/17/2014 with complaints of feeling weak and a one month history of a cough. He was found to be anemic and was referred to the emergency department. Hemoglobin was found to be 7.6, MCV 76.6; creatinine was elevated at 1.74. Stool was Hemoccult positive.  CT abdomen/pelvis without contrast on 08/17/2014 showed multiple large masses within the liver, bulky in appearance. The largest measured approximately 5.7 cm. There appeared to be a focal filling defect within the proximal jejunum measuring approximately 2.5 x 1.7 cm. There was mild adjacent jejunal wall thickening. The lung bases were clear.  On  08/18/2014 he underwent a small bowel enteroscopy with biopsy by Dr. Benson Norway. The esophagus and gastric lumen were normal. At the proximal jejunum there was evidence of abnormal mucosa and friability. The pediatric colonoscope was not able to traverse the area. Biopsies were obtained. An ultraslim colonoscope was then utilized. This colonoscope was able to traverse the area of stenosis which measured approximately 3 cm in length. 50% of the lumen was ulcerated. 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 MMR stains are pending.  He was discharged home on 08/19/2014.  CURRENT THERAPY: Lonsurf 20-8.19 3 tab twice daily, on day 1-5 and 8-12, every 28 days, started on 12/15/2016  INTERIM HISTORY:  Tyler Evans returns for follow-up. He recently began taking Lonsurf; today is his last day of his first cycle of ten days. He will be off sixteen days and then begin another cycle again on August 13th. So far he has not experienced issues with this. Denies nausea, vomiting, abdominal issues. He does not some mild non-productive cough which he was advised was a possible side effect of the medication. Denies fever. He also reports that he is mildly  fatigued. Denies hematochezia. He did recently discuss with Dr Barry Dienes of general surgery about J-tube/ bypass vs surveillance for his SBO. He has been on a low fiber and soft food diet recently for this and he has been tolerating this well without nausea/vomiting. His right hand has still been dry recently; he states that he has been wearing gloves and applying lotion regularly. This has been improving and his skin is no longer peeling. Does not need any medications refilled at this time.    This is a white MEDICAL HISTORY:  Past Medical History:  Diagnosis Date  . Diabetes mellitus without complication ([place])   . Hypertension   . Small bowel cancer ([place]) 08/18/2014    SURGICAL HISTORY: Past Surgical History:  Procedure Laterality  Date  . ENTEROSCOPY N/A 08/18/2014   Procedure: ENTEROSCOPY;  Surgeon: Carol Ada, MD;  Location: [facility];  Service: Endoscopy;  Laterality: N/A;    SOCIAL HISTORY: History   Social History  . Marital Status: Married    Spouse Name: N/A  . Number of Children: 2  . Years of Education: N/A   Occupational History  .  retired Ambulance person    Social History Main Topics  . Smoking status: Never Smoker   . Smokeless tobacco: Not on file  . Alcohol Use: No  . Drug Use: Not on file  . Sexual Activity: Not on file   Other Topics Concern  . Not on file   Social History Narrative  He lives in [place]. He is married. He has 2 children both reported to be in good health. No tobacco or alcohol use. He is a retired Ambulance person.  FAMILY HISTORY: Family History  Problem Relation Age of Onset  . Heart failure, Alzheimer's  Mother   . Lung cancer Father   . Kidney cancer Brother     ALLERGIES:  is allergic to penicillins.  MEDICATIONS:  Current Outpatient Prescriptions on File Prior to Visit  Medication Sig Dispense Refill  . FREESTYLE LITE test strip AS DIRECTED ONCE A DAY TO CHECK BLOOD SUGARS IN VITRO 30 DAYS  12  . gabapentin (NEURONTIN) 100 MG capsule TAKE 2 CAPSULES BY MOUTH 3 TIMES DAILY 180 capsule 1  . gabapentin (NEURONTIN) 100 MG capsule TAKE 2 CAPSULES BY MOUTH 3 TIMES DAILY 180 capsule 1  . LANTUS SOLOSTAR 100 UNIT/ML Solostar Pen Inject 20 Units into the skin every evening.     . lidocaine-prilocaine (EMLA) cream Apply topically as needed. Apply to portacath 1 1/2 hours - 2 hours prior to procedures as needed. (Patient taking differently: Apply 1 application topically daily as needed (port access). Apply to portacath 1 1/2 hours - 2 hours prior to procedures) 30 g 2  . ondansetron (ZOFRAN) 8 MG tablet Take 1 tablet (8 mg total) by mouth every 8 (eight) hours as needed for nausea or vomiting. 20 tablet 0  . tamsulosin (FLOMAX) 0.4 MG CAPS capsule Take 0.4 mg by  mouth every evening.   12  . trifluridine-tipiracil (LONSURF) 20-8.19 MG tablet Take 3 tablets ('60mg'$  trifluridine) by mouth 2 times daily, 1 hr after AM & PM meals on days 1-5, 8-12. Repeat every 28days 60 tablet 2  . atorvastatin (LIPITOR) 20 MG tablet Take 20 mg by mouth every evening.     . diphenoxylate-atropine (LOMOTIL) 2.5-0.025 MG tablet Take 2 tablets by mouth 4 (four) times daily as needed for diarrhea or loose stools. (Patient not taking: Reported on 09/30/2016) 60 tablet 0  . loperamide (IMODIUM)  2 MG capsule Take 4 mg by mouth 4 (four) times daily as needed for diarrhea or loose stools.    . potassium chloride SA (KLOR-CON M20) 20 MEQ tablet Take 1 tablet (20 mEq total) by mouth daily. (Patient not taking: Reported on 09/30/2016) 30 tablet 1   No current facility-administered medications on file prior to visit.   ;  REVIEW OF SYSTEMS:   Constitutional: Denies fevers, chills or abnormal night sweats; (+) mild fatigue.   Eyes: Denies blurriness of vision, double vision Ears, nose, mouth, throat, and face: Denies mucositis or sore throat Respiratory: One month history of a nonproductive cough;  no shortness of breath Cardiovascular: Denies palpitation, chest discomfort or lower extremity swelling Gastrointestinal:  Denies heartburn. No dysphagia. Denies nausea, vomiting, abdominal pain Skin: Denies abnormal skin rashes, (+) dry skin, hyperpigmentation on palms Lymphatics: Denies new lymphadenopathy or easy bruising Neurological: Denies extremity weakness  Behavioral/Psych: Mood is stable, no new changes  All other systems were reviewed with the patient and are negative.  PHYSICAL EXAMINATION:  ECOG PERFORMANCE STATUS: 1-2  Vitals:   12/26/16 1240  BP: (!) 116/47  Pulse: 76  Resp: 18  Temp: 97.8 F (36.6 C)   Filed Weights   12/26/16 1240  Weight: 162 lb (73.5 kg)     GENERAL:alert, no distress and comfortable SKIN: skin color, texture, turgor are normal, (+) skin  pigmentation On his face, and hands, from her recent Xeloda. (+) Diffuse mild skin erythema on his palms and bottom of feet. No skin peeling or crackers on palms or bottoms of feet.   EYES: normal, conjunctiva are pink and non-injected, sclera clear OROPHARYNX:no exudate, no erythema and lips, buccal mucosa, and tongue normal  NECK: supple, thyroid normal size, non-tender, without nodularity LYMPH:  no palpable lymphadenopathy in the cervical, axillary or inguinal regions LUNGS: clear to auscultation and percussion with normal breathing effort HEART: regular rate & rhythm and no murmurs and no lower extremity edema ABDOMEN:abdomen soft, mild tenderness at the right upper quadrant , no rebound pain, normal bowel sounds MSK:no cyanosis of digits and no clubbing  PSYCH: alert & oriented x 3 with fluent speech NEURO: no focal motor deficits, his light touch sensation and vibration sensation are diminished on his hands and feet.   LABORATORY DATA:  I have reviewed the data as listed CBC Latest Ref Rng & Units 12/26/2016 12/02/2016 10/29/2016  WBC 4.0 - 10.3 10e3/uL 4.6 7.8 6.6  Hemoglobin 13.0 - 17.1 g/dL 7.5(L) 10.8(L) 9.5(L)  Hematocrit 38.4 - 49.9 % 23.4(L) 33.7(L) 29.7(L)  Platelets 140 - 400 10e3/uL 78(L) 133(L) 89(L)    CMP Latest Ref Rng & Units 12/26/2016 12/02/2016 10/29/2016  Glucose 70 - 140 mg/dl 125 110 132  BUN 7.0 - 26.0 mg/dL 17.0 16.7 15.0  Creatinine 0.7 - 1.3 mg/dL 0.9 1.1 0.9  Sodium 136 - 145 mEq/L 138 141 140  Potassium 3.5 - 5.1 mEq/L 3.7 3.9 4.1  Chloride 101 - 111 mmol/L - - -  CO2 22 - 29 mEq/L '25 28 28  '$ Calcium 8.4 - 10.4 mg/dL 8.6 9.6 9.0  Total Protein 6.4 - 8.3 g/dL 6.2(L) 7.2 6.6  Total Bilirubin 0.20 - 1.20 mg/dL 0.90 0.88 0.69  Alkaline Phos 40 - 150 U/L 113 122 103  AST 5 - 34 U/L 39(H) 40(H) 31  ALT 0 - 55 U/L '12 18 13   '$ CEA: 08/31/2014: 189 12/12/2014: 9.0 03/27/2015: 5.0 05/29/2015: 17.6 07/09/2015: 41 11/26/2015: 153 01/30/2016: 523 02/29/2016:  985 03/31/2016: 295  04/29/2016: 244.69 05/27/2016: 347 06/24/2016: 427 07/15/2016: 709.32 08/27/2016: 862 09/30/16: 1424.10 10/29/16: 1884.61 12/02/2016: 2334.31 12/26/16: PENDING  Pathology report:  Small Intestine Biopsy, jejunal mass 08/18/2014 - INVASIVE ADENOCARCINOMA IN A BACKGROUND OF TUBULOVILLOUS ADENOMA. ADDITIONAL INFORMATION: Mismatch Repair (MMR) Protein Immunohistochemistry (IHC) IHC Expression Result (LIMITED TUMOR): MLH1: Preserved nuclear expression (greater 50% tumor expression) MSH2: Preserved nuclear expression (greater 50% tumor expression) MSH6: Preserved nuclear expression (greater 50% tumor expression) PMS2: Preserved nuclear expression (greater 50% tumor expression) * Internal control demonstrates intact nuclear expression Interpretation: NORMAL There is preserved expression  Diagnosis 08/28/2014  Liver, needle/core biopsy, right lobe - METASTATIC ADENOCARCINOMA       RADIOGRAPHIC STUDIES: I have personally reviewed the radiological images as listed and agreed with the findings in the report.  CT Abdomen Pelvis w contrast 12/02/2016  IMPRESSION: 1. Enlarging hepatic and pulmonary metastatic lesions. Several small new metastatic lesions to the liver. 2. Enlarging infiltrating mesenteric mass in the left upper quadrant is causing partial obstruction of the duodenum, with resulting dilatation of the stomach and duodenum extending up to this point. This infiltrating mesenteric mass also partially encases the superior mesenteric artery. 3. Mild infrahilar and retroperitoneal adenopathy. 4.  Aortic Atherosclerosis (ICD10-I70.0). 5. Lumbar spondylosis and degenerative disc disease potentially causing impingement in the lower lumbar spine. 6. Cholelithiasis. 7. Mild prostatomegaly.  CT chest abdomen pelvis with contrast 08/26/2016 IMPRESSION: 1. Similar pulmonary metastasis. 2. Similar to slight improvement in hepatic metastasis. 3. Enlargement of a  gastrohepatic ligament node which is suspicious. Possible adenopathy at the root of the small bowel mesenteriy. Recommend attention on follow-up. 4.  Coronary artery atherosclerosis. Aortic atherosclerosis. 5. Gynecomastia. 6. Prostatomegaly. 7. Cholelithiasis. 8. Pulmonary artery enlargement suggests pulmonary arterial hypertension.   CT CAP w/ Contrast 06/17/16 IMPRESSION: Mild increase in size of several hepatic metastases. Stable sub-cm bilateral pulmonary metastases. Interval resolution of diffuse colitis since prior exam. Incidentally noted aortic atherosclerosis, cholelithiasis, and mildly enlarged prostate.   ASSESSMENT & PLAN:  71 y.o. gentleman with past medical history of diabetes, hypertension, who presents with anemia and weakness.  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  -Unfortunately he has stage IV disease with diffuse liver metastasis, this is an incurable disease, with overall very poor prognosis. -I previously reviewed his staging CT scan results from 11/26/2015 with patient and his wife, Unfortunately scan showed disease progression in the liver, his tumor marker CA has been trending up lately, which is consistent with disease progression. -I previously discussed his [facility] testing results, which showed mutations in [place], RAF 1, APC, TP 39,  Targeted therapy is nt available at this point. -I previously discussed and reviewed his restaging CT images from 01/28/2016 which showed disease progression in liver and lungs. I recommend him to stop gemcitabine -His tumor marker CEA has previously been trending up, corresponding to his disease progression -I have checked the clinical trial options at [facility] and [place], unfortunately there is no suitable trials for him now.  -He previously  had multiple line chemo, currently on Xeloda with reduced dose to 1500 mg bid, 1 weeks on, one-week off. tolerating well --We previously Reviewed staging CT scan from 08/26/2016, which showed improved liver metastasis, stable lung nodules. No other new lesions. -I previously reviewed his staging CT scan from 12/29/2016, which unfortunately showed significant disease progression in the liver and primary site. He has  developed partial small bowel resection from the small bowel tumor, symptomatic with constipation and nausea.  - due to the SBO, I strongly encouraged him to change his diet to have less fiber  Low residue diet. I'll refer him to our dietitian Pamala Hurry. - I will talk with surgeon about surgical options for his small bowel obstruction, or J-tube placement for his nutrition if resection is not feasible. -Due to the significant disease progression, I previously  recommend him to stop Xeloda. He has had multiple line chemotherapy, treatment option is very limited at this point. -he has recently started Lonsurf, tolerating well -Lab reviewed, he has developed worsening anemia and thrombocytopenia. We'll give 2 units of blood transfusion next week, and watch his CBC closely.   2. Small bowel obstruction -Secondary to his primary proximal small bowel tumor -He is on low residual diet, tolerating well, normal nausea or vomiting. His bowel movement is normal. -Follow-up of his dietitian -I previously discussed with surgeons about possibility of small bowel surgery versus J-tube placement for nutrition.  -Today he returns after having spoke with Dr Barry Dienes, she is considering J-tube and bypass vs surveillance. I advised him to wait for now as he is tolerating his diet well with his current regimen and we will monitor the progression of his tumor. We will discuss options for surgery following his next scan.    3. Microcytic anemia secondary to GI bleeding, iron deficiency and chemotherapy -His  previous serum iron level and saturation were low, although ferritin is normal, he has some degree of iron deficient anemia, and anemia of malignancy  -Consider blood transfusion if hemoglobin less than 7.5 or symptomatic anemia -he received IV feraheme 510 mg twice in 09/2014, repeated iron study was normal on 07/23/2015 -His anemia got worse after he started gemcitabine, required blood transfusion -I previously encouraged him to take a multivitamin with minerals - Labs reviewed on 12/04/16. He is slightly anemic today, no need for blood transfusion.  -Reviewed labs, advised him that his HbG is 7.5 today, 12/27/15, and he needs a blood transfusion. Discussed that this is likely the source of his mild fatigue and that he will need to have transfusion next week. We also discussed that his plantlet are low at 7.8.  -We'll f/u w/ him following his transfusion next week.   4.  Hypertension and DM  -He will continue follow-up with his primary care physician. -He has been off lisinopril lately due to borderline low blood pressure, his blood pressure is not elevated today, he will continue monitoring at home, and restart lisinopril if needed -continue close monitoring   5. Peripheral neuropathy, hand-foot syndrome, secondary to chemotherapy -His peripheral neuropathy, worse after Xeloda, especially with skin reactions, much improving now. -Continue Neurontin  200 mg 3 times a day -We'll continue observation.  6. Goal of care discussion  -We again had discussed the incurable nature of his cancer, and the overall poor prognosis, especially now he has progressed through multiple lines of chemotherapy and his treatment option is very limited. -The patient understands the goal of care is palliative. -I recommend DNR/DNI, he agrees.   Plan: - Blood transfusion next week to improve his anemia.  -f/u in two weeks before next cycle Lonsurf, will consider reducing dose from next cycle due to pancytopenia   -Continue with Lonsurf as prescribed (he will be off for the next 16 days) -continue low fiber diet -Will scan following the end of next cycle of Losurf  -Hold off on SBO  surgery for now; will inform and discuss this with Dr Barry Dienes and possibly consider this in 4-5 weeks.   I spent 30 minutes before his visit, more than 50% on face-to-face counseling.  I have reviewed the above documentation for accuracy and completeness, and I agree with the above information.      This document serves as a record of services personally performed by Truitt Merle, MD. It was created on her behalf by Maryla Morrow, a trained medical scribe. The creation of this record is based on the scribe's personal observations and the provider's statements to them. This document has been checked and approved by the attending provider.  Truitt Merle  12/26/2016

## 2016-12-26 ENCOUNTER — Telehealth: Payer: Self-pay | Admitting: Hematology

## 2016-12-26 ENCOUNTER — Ambulatory Visit (HOSPITAL_BASED_OUTPATIENT_CLINIC_OR_DEPARTMENT_OTHER): Payer: Medicare Other

## 2016-12-26 ENCOUNTER — Other Ambulatory Visit (HOSPITAL_BASED_OUTPATIENT_CLINIC_OR_DEPARTMENT_OTHER): Payer: Medicare Other

## 2016-12-26 ENCOUNTER — Ambulatory Visit (HOSPITAL_BASED_OUTPATIENT_CLINIC_OR_DEPARTMENT_OTHER): Payer: Medicare Other | Admitting: Hematology

## 2016-12-26 ENCOUNTER — Encounter: Payer: Self-pay | Admitting: Hematology

## 2016-12-26 VITALS — BP 116/47 | HR 76 | Temp 97.8°F | Resp 18 | Ht 66.0 in | Wt 162.0 lb

## 2016-12-26 DIAGNOSIS — C179 Malignant neoplasm of small intestine, unspecified: Secondary | ICD-10-CM

## 2016-12-26 DIAGNOSIS — C772 Secondary and unspecified malignant neoplasm of intra-abdominal lymph nodes: Secondary | ICD-10-CM | POA: Diagnosis not present

## 2016-12-26 DIAGNOSIS — I10 Essential (primary) hypertension: Secondary | ICD-10-CM

## 2016-12-26 DIAGNOSIS — C787 Secondary malignant neoplasm of liver and intrahepatic bile duct: Secondary | ICD-10-CM

## 2016-12-26 DIAGNOSIS — L271 Localized skin eruption due to drugs and medicaments taken internally: Secondary | ICD-10-CM

## 2016-12-26 DIAGNOSIS — Z7189 Other specified counseling: Secondary | ICD-10-CM

## 2016-12-26 DIAGNOSIS — D630 Anemia in neoplastic disease: Secondary | ICD-10-CM | POA: Diagnosis not present

## 2016-12-26 DIAGNOSIS — D500 Iron deficiency anemia secondary to blood loss (chronic): Secondary | ICD-10-CM | POA: Diagnosis not present

## 2016-12-26 DIAGNOSIS — Z66 Do not resuscitate: Secondary | ICD-10-CM

## 2016-12-26 DIAGNOSIS — G620 Drug-induced polyneuropathy: Secondary | ICD-10-CM | POA: Diagnosis not present

## 2016-12-26 DIAGNOSIS — K56699 Other intestinal obstruction unspecified as to partial versus complete obstruction: Secondary | ICD-10-CM

## 2016-12-26 DIAGNOSIS — Z95828 Presence of other vascular implants and grafts: Secondary | ICD-10-CM

## 2016-12-26 DIAGNOSIS — E119 Type 2 diabetes mellitus without complications: Secondary | ICD-10-CM

## 2016-12-26 DIAGNOSIS — D509 Iron deficiency anemia, unspecified: Secondary | ICD-10-CM | POA: Diagnosis not present

## 2016-12-26 DIAGNOSIS — D6481 Anemia due to antineoplastic chemotherapy: Secondary | ICD-10-CM | POA: Diagnosis not present

## 2016-12-26 DIAGNOSIS — T451X5A Adverse effect of antineoplastic and immunosuppressive drugs, initial encounter: Secondary | ICD-10-CM

## 2016-12-26 LAB — CEA (IN HOUSE-CHCC)

## 2016-12-26 LAB — COMPREHENSIVE METABOLIC PANEL
ALT: 12 U/L (ref 0–55)
ANION GAP: 10 meq/L (ref 3–11)
AST: 39 U/L — ABNORMAL HIGH (ref 5–34)
Albumin: 2.6 g/dL — ABNORMAL LOW (ref 3.5–5.0)
Alkaline Phosphatase: 113 U/L (ref 40–150)
BUN: 17 mg/dL (ref 7.0–26.0)
CALCIUM: 8.6 mg/dL (ref 8.4–10.4)
CHLORIDE: 103 meq/L (ref 98–109)
CO2: 25 meq/L (ref 22–29)
CREATININE: 0.9 mg/dL (ref 0.7–1.3)
EGFR: 84 mL/min/{1.73_m2} — ABNORMAL LOW (ref >90)
Glucose: 125 mg/dl (ref 70–140)
POTASSIUM: 3.7 meq/L (ref 3.5–5.1)
Sodium: 138 mEq/L (ref 136–145)
Total Bilirubin: 0.9 mg/dL (ref 0.20–1.20)
Total Protein: 6.2 g/dL — ABNORMAL LOW (ref 6.4–8.3)

## 2016-12-26 LAB — CBC WITH DIFFERENTIAL/PLATELET
BASO%: 0.4 % (ref 0.0–2.0)
BASOS ABS: 0 10*3/uL (ref 0.0–0.1)
EOS%: 0.7 % (ref 0.0–7.0)
Eosinophils Absolute: 0 10*3/uL (ref 0.0–0.5)
HEMATOCRIT: 23.4 % — AB (ref 38.4–49.9)
HGB: 7.5 g/dL — ABNORMAL LOW (ref 13.0–17.1)
LYMPH%: 16.3 % (ref 14.0–49.0)
MCH: 28.7 pg (ref 27.2–33.4)
MCHC: 32.1 g/dL (ref 32.0–36.0)
MCV: 89.2 fL (ref 79.3–98.0)
MONO#: 0.2 10*3/uL (ref 0.1–0.9)
MONO%: 3.5 % (ref 0.0–14.0)
NEUT#: 3.6 10*3/uL (ref 1.5–6.5)
NEUT%: 79.1 % — AB (ref 39.0–75.0)
PLATELETS: 78 10*3/uL — AB (ref 140–400)
RBC: 2.62 10*6/uL — AB (ref 4.20–5.82)
RDW: 16.8 % — ABNORMAL HIGH (ref 11.0–14.6)
WBC: 4.6 10*3/uL (ref 4.0–10.3)
lymph#: 0.7 10*3/uL — ABNORMAL LOW (ref 0.9–3.3)

## 2016-12-26 MED ORDER — SODIUM CHLORIDE 0.9 % IJ SOLN
10.0000 mL | INTRAMUSCULAR | Status: DC | PRN
  Administered 2016-12-26: 10 mL via INTRAVENOUS
  Filled 2016-12-26: qty 10

## 2016-12-26 MED ORDER — HEPARIN SOD (PORK) LOCK FLUSH 100 UNIT/ML IV SOLN
500.0000 [IU] | Freq: Once | INTRAVENOUS | Status: AC | PRN
Start: 2016-12-26 — End: 2016-12-26
  Administered 2016-12-26: 500 [IU] via INTRAVENOUS
  Filled 2016-12-26: qty 5

## 2016-12-26 NOTE — Patient Instructions (Signed)

## 2016-12-26 NOTE — Telephone Encounter (Signed)
Gave patient avs report and appointments for July and August.  °

## 2016-12-27 ENCOUNTER — Encounter: Payer: Self-pay | Admitting: Hematology

## 2016-12-27 MED ORDER — DIPHENHYDRAMINE HCL 25 MG PO CAPS: 25.0000 mg | ORAL_CAPSULE | Freq: Once | ORAL | Status: AC

## 2016-12-27 MED ORDER — SODIUM CHLORIDE 0.9 % IV SOLN: 250.0000 mL | Freq: Once | INTRAVENOUS | Status: AC

## 2016-12-27 MED ORDER — SODIUM CHLORIDE 0.9% FLUSH
3.0000 mL | INTRAVENOUS | Status: AC | PRN
  Filled 2016-12-27: qty 10

## 2016-12-27 MED ORDER — SODIUM CHLORIDE 0.9% FLUSH
10.0000 mL | INTRAVENOUS | Status: AC | PRN
  Filled 2016-12-27: qty 10

## 2016-12-27 MED ORDER — HEPARIN SOD (PORK) LOCK FLUSH 100 UNIT/ML IV SOLN
500.0000 [IU] | Freq: Every day | INTRAVENOUS | Status: AC | PRN
  Filled 2016-12-27: qty 5

## 2016-12-27 MED ORDER — ACETAMINOPHEN 325 MG PO TABS: 650.0000 mg | ORAL_TABLET | Freq: Once | ORAL | Status: AC

## 2016-12-27 MED ORDER — HEPARIN SOD (PORK) LOCK FLUSH 100 UNIT/ML IV SOLN
250.0000 [IU] | INTRAVENOUS | Status: AC | PRN
  Filled 2016-12-27: qty 5

## 2016-12-27 NOTE — Addendum Note (Signed)
Addended by: Truitt Merle on: 12/27/2016 01:57 PM   Modules accepted: Orders, SmartSet

## 2016-12-29 ENCOUNTER — Other Ambulatory Visit: Payer: Medicare Other

## 2016-12-29 ENCOUNTER — Ambulatory Visit (HOSPITAL_COMMUNITY)
Admission: RE | Admit: 2016-12-29 | Discharge: 2016-12-29 | Disposition: A | Discharge status: Home / Self Care | Payer: Medicare Other | Source: Ambulatory Visit | Attending: Hematology | Admitting: Hematology

## 2016-12-29 DIAGNOSIS — D630 Anemia in neoplastic disease: Secondary | ICD-10-CM | POA: Insufficient documentation

## 2016-12-29 DIAGNOSIS — C179 Malignant neoplasm of small intestine, unspecified: Secondary | ICD-10-CM | POA: Diagnosis not present

## 2016-12-29 DIAGNOSIS — C787 Secondary malignant neoplasm of liver and intrahepatic bile duct: Secondary | ICD-10-CM | POA: Diagnosis not present

## 2016-12-29 DIAGNOSIS — C772 Secondary and unspecified malignant neoplasm of intra-abdominal lymph nodes: Secondary | ICD-10-CM | POA: Diagnosis not present

## 2016-12-29 LAB — COMPREHENSIVE METABOLIC PANEL
ALK PHOS: 111 U/L (ref 40–150)
ALT: 12 U/L (ref 0–55)
AST: 36 U/L — ABNORMAL HIGH (ref 5–34)
Albumin: 2.5 g/dL — ABNORMAL LOW (ref 3.5–5.0)
Anion Gap: 7 mEq/L (ref 3–11)
BUN: 17.2 mg/dL (ref 7.0–26.0)
CALCIUM: 8.7 mg/dL (ref 8.4–10.4)
CHLORIDE: 105 meq/L (ref 98–109)
CO2: 27 mEq/L (ref 22–29)
Creatinine: 0.8 mg/dL (ref 0.7–1.3)
EGFR: 87 mL/min/{1.73_m2} — AB (ref >90)
GLUCOSE: 132 mg/dL (ref 70–140)
POTASSIUM: 4 meq/L (ref 3.5–5.1)
Sodium: 139 mEq/L (ref 136–145)
TOTAL PROTEIN: 6.2 g/dL — AB (ref 6.4–8.3)
Total Bilirubin: 0.86 mg/dL (ref 0.20–1.20)

## 2016-12-29 LAB — CBC WITH DIFFERENTIAL/PLATELET
BASO%: 0 % (ref 0.0–2.0)
Basophils Absolute: 0 10*3/uL (ref 0.0–0.1)
EOS%: 0.6 % (ref 0.0–7.0)
Eosinophils Absolute: 0 10*3/uL (ref 0.0–0.5)
HEMATOCRIT: 23 % — AB (ref 38.4–49.9)
HEMOGLOBIN: 7.3 g/dL — AB (ref 13.0–17.1)
LYMPH#: 0.7 10*3/uL — AB (ref 0.9–3.3)
LYMPH%: 20.7 % (ref 14.0–49.0)
MCH: 28.2 pg (ref 27.2–33.4)
MCHC: 31.7 g/dL — ABNORMAL LOW (ref 32.0–36.0)
MCV: 88.8 fL (ref 79.3–98.0)
MONO#: 0 10*3/uL — AB (ref 0.1–0.9)
MONO%: 1.2 % (ref 0.0–14.0)
NEUT%: 77.5 % — ABNORMAL HIGH (ref 39.0–75.0)
NEUTROS ABS: 2.7 10*3/uL (ref 1.5–6.5)
PLATELETS: 66 10*3/uL — AB (ref 140–400)
RBC: 2.59 10*6/uL — AB (ref 4.20–5.82)
RDW: 16.4 % — ABNORMAL HIGH (ref 11.0–14.6)
WBC: 3.5 10*3/uL — AB (ref 4.0–10.3)

## 2016-12-29 MED ORDER — BUPIVACAINE-EPINEPHRINE (PF) 0.5% -1:200000 IJ SOLN
INTRAMUSCULAR | Status: AC
  Filled 2016-12-29: qty 150

## 2016-12-30 ENCOUNTER — Ambulatory Visit (HOSPITAL_BASED_OUTPATIENT_CLINIC_OR_DEPARTMENT_OTHER): Payer: Medicare Other

## 2016-12-30 VITALS — BP 123/46 | HR 60 | Temp 98.0°F | Resp 16

## 2016-12-30 DIAGNOSIS — C179 Malignant neoplasm of small intestine, unspecified: Secondary | ICD-10-CM

## 2016-12-30 DIAGNOSIS — C772 Secondary and unspecified malignant neoplasm of intra-abdominal lymph nodes: Secondary | ICD-10-CM

## 2016-12-30 DIAGNOSIS — C787 Secondary malignant neoplasm of liver and intrahepatic bile duct: Secondary | ICD-10-CM

## 2016-12-30 DIAGNOSIS — D630 Anemia in neoplastic disease: Secondary | ICD-10-CM | POA: Diagnosis not present

## 2016-12-30 LAB — PREPARE RBC (CROSSMATCH)

## 2016-12-30 MED ORDER — HEPARIN SOD (PORK) LOCK FLUSH 100 UNIT/ML IV SOLN
500.0000 [IU] | Freq: Every day | INTRAVENOUS | Status: AC | PRN
  Administered 2016-12-30: 500 [IU]
  Filled 2016-12-30: qty 5

## 2016-12-30 MED ORDER — ACETAMINOPHEN 325 MG PO TABS
650.0000 mg | ORAL_TABLET | Freq: Once | ORAL | Status: AC
  Administered 2016-12-30: 650 mg via ORAL

## 2016-12-30 MED ORDER — ACETAMINOPHEN 325 MG PO TABS
ORAL_TABLET | ORAL | Status: AC
  Filled 2016-12-30: qty 2

## 2016-12-30 MED ORDER — DIPHENHYDRAMINE HCL 25 MG PO CAPS
ORAL_CAPSULE | ORAL | Status: AC
  Filled 2016-12-30: qty 1

## 2016-12-30 MED ORDER — SODIUM CHLORIDE 0.9 % IV SOLN
250.0000 mL | Freq: Once | INTRAVENOUS | Status: AC
Start: 2016-12-30 — End: 2016-12-30
  Administered 2016-12-30: 250 mL via INTRAVENOUS

## 2016-12-30 MED ORDER — SODIUM CHLORIDE 0.9% FLUSH
10.0000 mL | INTRAVENOUS | Status: AC | PRN
  Administered 2016-12-30: 10 mL
  Filled 2016-12-30: qty 10

## 2016-12-30 MED ORDER — DIPHENHYDRAMINE HCL 25 MG PO CAPS
25.0000 mg | ORAL_CAPSULE | Freq: Once | ORAL | Status: AC
  Administered 2016-12-30: 25 mg via ORAL

## 2016-12-30 NOTE — Patient Instructions (Signed)

## 2016-12-31 LAB — BPAM RBC
BLOOD PRODUCT EXPIRATION DATE: 201808142359
BLOOD PRODUCT EXPIRATION DATE: 201808142359
ISSUE DATE / TIME: 201807310913
ISSUE DATE / TIME: 201807310913
UNIT TYPE AND RH: 6200
Unit Type and Rh: 6200

## 2016-12-31 LAB — TYPE AND SCREEN
ABO/RH(D): A POS
Antibody Screen: NEGATIVE
UNIT DIVISION: 0
Unit division: 0

## 2017-01-07 ENCOUNTER — Ambulatory Visit (HOSPITAL_BASED_OUTPATIENT_CLINIC_OR_DEPARTMENT_OTHER): Payer: Medicare Other | Admitting: Hematology

## 2017-01-07 ENCOUNTER — Other Ambulatory Visit (HOSPITAL_BASED_OUTPATIENT_CLINIC_OR_DEPARTMENT_OTHER): Payer: Medicare Other

## 2017-01-07 ENCOUNTER — Telehealth: Payer: Self-pay | Admitting: Hematology

## 2017-01-07 ENCOUNTER — Encounter: Payer: Self-pay | Admitting: Hematology

## 2017-01-07 VITALS — BP 127/57 | HR 83 | Temp 98.6°F | Resp 18 | Ht 66.0 in | Wt 160.9 lb

## 2017-01-07 DIAGNOSIS — T451X5A Adverse effect of antineoplastic and immunosuppressive drugs, initial encounter: Secondary | ICD-10-CM

## 2017-01-07 DIAGNOSIS — D500 Iron deficiency anemia secondary to blood loss (chronic): Secondary | ICD-10-CM | POA: Diagnosis not present

## 2017-01-07 DIAGNOSIS — C787 Secondary malignant neoplasm of liver and intrahepatic bile duct: Secondary | ICD-10-CM

## 2017-01-07 DIAGNOSIS — D630 Anemia in neoplastic disease: Secondary | ICD-10-CM | POA: Diagnosis not present

## 2017-01-07 DIAGNOSIS — C179 Malignant neoplasm of small intestine, unspecified: Secondary | ICD-10-CM | POA: Diagnosis not present

## 2017-01-07 DIAGNOSIS — E119 Type 2 diabetes mellitus without complications: Secondary | ICD-10-CM | POA: Diagnosis not present

## 2017-01-07 DIAGNOSIS — G620 Drug-induced polyneuropathy: Secondary | ICD-10-CM

## 2017-01-07 DIAGNOSIS — D6481 Anemia due to antineoplastic chemotherapy: Secondary | ICD-10-CM

## 2017-01-07 DIAGNOSIS — R05 Cough: Secondary | ICD-10-CM

## 2017-01-07 DIAGNOSIS — D509 Iron deficiency anemia, unspecified: Secondary | ICD-10-CM

## 2017-01-07 DIAGNOSIS — K56609 Unspecified intestinal obstruction, unspecified as to partial versus complete obstruction: Secondary | ICD-10-CM | POA: Diagnosis not present

## 2017-01-07 DIAGNOSIS — Z66 Do not resuscitate: Secondary | ICD-10-CM

## 2017-01-07 DIAGNOSIS — C772 Secondary and unspecified malignant neoplasm of intra-abdominal lymph nodes: Secondary | ICD-10-CM

## 2017-01-07 DIAGNOSIS — I10 Essential (primary) hypertension: Secondary | ICD-10-CM | POA: Diagnosis not present

## 2017-01-07 DIAGNOSIS — Z7189 Other specified counseling: Secondary | ICD-10-CM

## 2017-01-07 LAB — COMPREHENSIVE METABOLIC PANEL
ALBUMIN: 2.4 g/dL — AB (ref 3.5–5.0)
ALT: 13 U/L (ref 0–55)
ANION GAP: 9 meq/L (ref 3–11)
AST: 34 U/L (ref 5–34)
Alkaline Phosphatase: 127 U/L (ref 40–150)
BILIRUBIN TOTAL: 1.04 mg/dL (ref 0.20–1.20)
BUN: 12.3 mg/dL (ref 7.0–26.0)
CALCIUM: 8.9 mg/dL (ref 8.4–10.4)
CO2: 28 mEq/L (ref 22–29)
CREATININE: 0.9 mg/dL (ref 0.7–1.3)
Chloride: 100 mEq/L (ref 98–109)
EGFR: 86 mL/min/{1.73_m2} — ABNORMAL LOW (ref >90)
Glucose: 163 mg/dl — ABNORMAL HIGH (ref 70–140)
Potassium: 3.9 mEq/L (ref 3.5–5.1)
Sodium: 137 mEq/L (ref 136–145)
TOTAL PROTEIN: 6.3 g/dL — AB (ref 6.4–8.3)

## 2017-01-07 LAB — CBC WITH DIFFERENTIAL/PLATELET
BASO%: 0 % (ref 0.0–2.0)
Basophils Absolute: 0 10*3/uL (ref 0.0–0.1)
EOS%: 1.2 % (ref 0.0–7.0)
Eosinophils Absolute: 0 10*3/uL (ref 0.0–0.5)
HEMATOCRIT: 28.4 % — AB (ref 38.4–49.9)
HEMOGLOBIN: 9.2 g/dL — AB (ref 13.0–17.1)
LYMPH#: 0.7 10*3/uL — AB (ref 0.9–3.3)
LYMPH%: 44.5 % (ref 14.0–49.0)
MCH: 28.8 pg (ref 27.2–33.4)
MCHC: 32.4 g/dL (ref 32.0–36.0)
MCV: 89 fL (ref 79.3–98.0)
MONO#: 0.5 10*3/uL (ref 0.1–0.9)
MONO%: 28.7 % — ABNORMAL HIGH (ref 0.0–14.0)
NEUT%: 25.6 % — AB (ref 39.0–75.0)
NEUTROS ABS: 0.4 10*3/uL — AB (ref 1.5–6.5)
PLATELETS: 93 10*3/uL — AB (ref 140–400)
RBC: 3.19 10*6/uL — ABNORMAL LOW (ref 4.20–5.82)
RDW: 16.5 % — AB (ref 11.0–14.6)
WBC: 1.6 10*3/uL — ABNORMAL LOW (ref 4.0–10.3)

## 2017-01-07 NOTE — Telephone Encounter (Signed)
Scheduled appt per 8/8 los - Gave patient AVS and calender per los. 

## 2017-01-07 NOTE — Progress Notes (Signed)
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       08/18/2014 Miscellaneous    tumor KRAS mutation (-)      08/18/2014 Pathology Results    Small intestine biopsy showed metastatic adenocarcinoma, consistent with GI primary      08/18/2014 Initial Diagnosis    Small bowel cancer      08/18/2014 Procedure    small bowel enteroscopy with biopsy by Dr. Benson Norway showed at the proximal jejunum there was evidence of abnormal mucosa and friability, biopsy was obtained.       08/28/2014 Pathology Results    Diagnosis Liver, needle/core biopsy, right lobe - METASTATIC ADENOCARCINOMA        09/05/2014 Imaging    PET hypermetabolic mass involving a small bowel loop with adjacent mesenteric lymphadenopathy, and diffuse liver metastasis and mild hypermetabolic lymphadenopathy in porta hepatis.      09/06/2014 - 02/01/2015 Chemotherapy    mFOLFOX, stopped due to his neuropathy, and earlier mild disease progression on PET       09/07/2014 - 09/11/2014 Hospital Admission    He was admitted for fever and GI bleeding. Received 3 units of RBC.      10/26/2014 Imaging    Interval significant improvement in the primary small bowel mass, adjacent lymphadenopathy and extensive hepatic metastatic disease. No other new lesions.       02/09/2015 Imaging    PET/CT scan showed stable primary malignancy in the proximal jejunum, mild metabolic progression of the 3 residual metabolic liver  metastasis. CT  Portion showed decreased size of his liver metastasis.      02/27/2015 - 11/06/2015 Chemotherapy    second line chemo FOLFIRI, every 2 weeks, and panitumumab (held for second cycle due to severe skin rashes), chemotherapy stopped due to disease progression.      08/06/2015 Imaging    Stable number and size of the liver lesions. The left lower lobe nodule is considerably less prominent. No other new lesions.      11/26/2015 Progression    Restaging CT chest, abdomen and pelvis with contrast showed interval increase in size of liver lesions, multiple small pulmonary nodules are not significantly changed in the interval. Her tumor marker CEA also increased significantly      12/07/2015 - 01/16/2016 Chemotherapy    Gemcitabine 1000 mg/m2 (decreased to 800 mg/m from second dose), and a one and 8 every 21 days, stopped due to disease progression       02/12/2016 - 03/19/2016 Chemotherapy    Xeloda '1000mg'$ /m2 twice daily, 2 weeks on, one-week off, stopped after 2 cycles due to severe side effects (diarrhea, skin toxicities)      03/19/2016 - 03/21/2016 Hospital Admission    Patient was admitted for severe diarrhea, dehydration after second cycle of Xeloda, stoll c-diff was negative. He received IV fluids and supportive care.      06/17/2016 Imaging    CT Chest, Abdomen, Pelvis w/  Contrast  IMPRESSION: Mild increase in size of several hepatic metastases.  Stable sub-cm bilateral pulmonary metastases.  Interval resolution of diffuse colitis since prior exam.  Incidentally noted aortic atherosclerosis, cholelithiasis, and mildly enlarged prostate.      06/24/2016 - 12/04/2016 Chemotherapy    Xeloda '1500mg'$  bid 2 weeks on, 1 weeks off, changed to 1 week on and 1 week off from cycle 2 due to tolerance issue. Reduced to 5 days on and 9 days off due to sensitivity to Xeloda.       08/26/2016 Imaging     CT chest abdomen pelvis with contrast IMPRESSION: 1. Similar pulmonary  metastasis. 2. Similar to slight improvement in hepatic metastasis. 3. Enlargement of a gastrohepatic ligament node which is suspicious. Possible adenopathy at the root of the small bowel mesenteriy. Recommend attention on follow-up. 4.  Coronary artery atherosclerosis. Aortic atherosclerosis. 5. Gynecomastia. 6. Prostatomegaly. 7. Cholelithiasis. 8. Pulmonary artery enlargement suggests pulmonary arterial hypertension.       12/02/2016 Imaging    CT Abdomen Pelvis w contrast 12/02/2016  IMPRESSION: 1. Enlarging hepatic and pulmonary metastatic lesions. Several small new metastatic lesions to the liver. 2. Enlarging infiltrating mesenteric mass in the left upper quadrant is causing partial obstruction of the duodenum, with resulting dilatation of the stomach and duodenum extending up to this point. This infiltrating mesenteric mass also partially encases the superior mesenteric artery. 3. Mild infrahilar and retroperitoneal adenopathy. 4.  Aortic Atherosclerosis (ICD10-I70.0). 5. Lumbar spondylosis and degenerative disc disease potentially causing impingement in the lower lumbar spine. 6. Cholelithiasis. 7. Mild prostatomegaly.      12/15/2016 -  Chemotherapy    Due to cancer progression, changed Chemo to Lonsurf Lonsurf 20-8.19 3 tab twice daily, on day 1-5 and 8-12, every 28 days, started on 12/15/2016      HISTORY OF PRESENTING ILLNESS: 12/12/15 Tyler Evans 71 y.o. male is here because of a recent diagnosis of small bowel cancer. He presented to his PCP on 08/17/2014 with complaints of feeling weak and a one month history of a cough. He was found to be anemic and was referred to the emergency department. Hemoglobin was found to be 7.6, MCV 76.6; creatinine was elevated at 1.74. Stool was Hemoccult positive.  CT abdomen/pelvis without contrast on 08/17/2014 showed multiple large masses within the liver, bulky in appearance. The largest measured approximately 5.7 cm. There  appeared to be a focal filling defect within the proximal jejunum measuring approximately 2.5 x 1.7 cm. There was mild adjacent jejunal wall thickening. The lung bases were clear.  On 08/18/2014 he underwent a small bowel enteroscopy with biopsy by Dr. Benson Norway. The esophagus and gastric lumen were normal. At the proximal jejunum there was evidence of abnormal mucosa and friability. The pediatric colonoscope was not able to traverse the area. Biopsies were obtained. An ultraslim colonoscope was then utilized. This colonoscope was able to traverse the area of stenosis which measured approximately 3 cm in length. 50% of the lumen was ulcerated. 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 MMR stains are pending.  He was discharged home on 08/19/2014.  CURRENT THERAPY: Lonsurf 20-8.19 3 tab twice daily, on day 1-5 and 8-12, every 28 days, started on 12/15/2016  INTERIM HISTORY:  Mr. Treptow returns for follow-up. He presents to the clinic today with wife. He reports he does not feel much better after blood transfusion. He still feels tired. He does not feel like doing much. What he does is mostly inside the  house. He is overall doing less activity the last 2 weeks. He denies fever or chill. He does have a dry cough with the change in temperature. He does not have nausea but he does not eat very well he mostly drinks and intakes liquids. His BM is slow, and he can get bloated, if he does not go in 2 days he will take something to help him go. He takes 2 ensureboosts a day.    This is a white MEDICAL HISTORY:  Past Medical History:  Diagnosis Date  . Diabetes mellitus without complication ([place])   . Hypertension   . Small bowel cancer ([place]) 08/18/2014    SURGICAL HISTORY: Past Surgical History:  Procedure Laterality Date  . ENTEROSCOPY N/A 08/18/2014   Procedure: ENTEROSCOPY;  Surgeon: Carol Ada, MD;  Location: [facility];  Service: Endoscopy;  Laterality: N/A;      SOCIAL HISTORY: History   Social History  . Marital Status: Married    Spouse Name: N/A  . Number of Children: 2  . Years of Education: N/A   Occupational History  .  retired Ambulance person    Social History Main Topics  . Smoking status: Never Smoker   . Smokeless tobacco: Not on file  . Alcohol Use: No  . Drug Use: Not on file  . Sexual Activity: Not on file   Other Topics Concern  . Not on file   Social History Narrative  He lives in [place]. He is married. He has 2 children both reported to be in good health. No tobacco or alcohol use. He is a retired Ambulance person.  FAMILY HISTORY: Family History  Problem Relation Age of Onset  . Heart failure, Alzheimer's  Mother   . Lung cancer Father   . Kidney cancer Brother     ALLERGIES:  is allergic to penicillins.  MEDICATIONS:  Current Outpatient Prescriptions on File Prior to Visit  Medication Sig Dispense Refill  . atorvastatin (LIPITOR) 20 MG tablet Take 20 mg by mouth every evening.     . diphenoxylate-atropine (LOMOTIL) 2.5-0.025 MG tablet Take 2 tablets by mouth 4 (four) times daily as needed for diarrhea or loose stools. 60 tablet 0  . FREESTYLE LITE test strip AS DIRECTED ONCE A DAY TO CHECK BLOOD SUGARS IN VITRO 30 DAYS  12  . gabapentin (NEURONTIN) 100 MG capsule TAKE 2 CAPSULES BY MOUTH 3 TIMES DAILY 180 capsule 1  . LANTUS SOLOSTAR 100 UNIT/ML Solostar Pen Inject 20 Units into the skin every evening.     . lidocaine-prilocaine (EMLA) cream Apply topically as needed. Apply to portacath 1 1/2 hours - 2 hours prior to procedures as needed. (Patient taking differently: Apply 1 application topically daily as needed (port access). Apply to portacath 1 1/2 hours - 2 hours prior to procedures) 30 g 2  . loperamide (IMODIUM) 2 MG capsule Take 4 mg by mouth 4 (four) times daily as needed for diarrhea or loose stools.    . ondansetron (ZOFRAN) 8 MG tablet Take 1 tablet (8 mg total) by mouth every 8 (eight) hours  as needed for nausea or vomiting. 20 tablet 0  . potassium chloride SA (KLOR-CON M20) 20 MEQ tablet Take 1 tablet (20 mEq total) by mouth daily. 30 tablet 1  . tamsulosin (FLOMAX) 0.4 MG CAPS capsule Take 0.4 mg by mouth every evening.   12  . trifluridine-tipiracil (LONSURF) 20-8.19 MG tablet Take 3 tablets ('60mg'$  trifluridine) by mouth 2 times daily, 1 hr after AM &  PM meals on days 1-5, 8-12. Repeat every 28days 60 tablet 2   Current Facility-Administered Medications on File Prior to Visit  Medication Dose Route Frequency Provider Last Rate Last Dose  . 0.9 %  sodium chloride infusion  250 mL Intravenous Once Truitt Merle, MD      . acetaminophen (TYLENOL) tablet 650 mg  650 mg Oral Once Truitt Merle, MD      . diphenhydrAMINE (BENADRYL) capsule 25 mg  25 mg Oral Once Truitt Merle, MD      ;  REVIEW OF SYSTEMS:   Constitutional: Denies fevers, chills or abnormal night sweats; (+) severe fatigue (+) low energy Eyes: Denies blurriness of vision, double vision Ears, nose, mouth, throat, and face: Denies mucositis or sore throat Respiratory: One month history of a nonproductive cough;  no shortness of breath Cardiovascular: Denies palpitation, chest discomfort or lower extremity swelling Gastrointestinal:  Denies heartburn. No dysphagia. Denies nausea, vomiting, abdominal pain (+) constipation  Skin: Denies abnormal skin rashes, (+) dry skin, hyperpigmentation on palms Lymphatics: Denies new lymphadenopathy or easy bruising Neurological: Denies extremity weakness  Behavioral/Psych: Mood is stable, no new changes  All other systems were reviewed with the patient and are negative.  PHYSICAL EXAMINATION:  ECOG PERFORMANCE STATUS: 3  Vitals:   01/07/17 1116  BP: (!) 127/57  Pulse: 83  Resp: 18  Temp: 98.6 F (37 C)   Filed Weights   01/07/17 1116  Weight: 160 lb 14.4 oz (73 kg)     GENERAL:alert, no distress and comfortable SKIN: skin color, texture, turgor are normal, (+) skin  pigmentation On his face, and hands, from her recent Xeloda. (+) Diffuse mild skin erythema on his palms and bottom of feet. No skin peeling or crackers on palms or bottoms of feet.   EYES: normal, conjunctiva are pink and non-injected, sclera clear OROPHARYNX:no exudate, no erythema and lips, buccal mucosa, and tongue normal  NECK: supple, thyroid normal size, non-tender, without nodularity LYMPH:  no palpable lymphadenopathy in the cervical, axillary or inguinal regions LUNGS: clear to auscultation and percussion with normal breathing effort HEART: regular rate & rhythm and no murmurs (+) slight swelling of lower extremity.  ABDOMEN:abdomen soft, mild tenderness at the right upper quadrant , no rebound pain, normal bowel sounds MSK:no cyanosis of digits and no clubbing  PSYCH: alert & oriented x 3 with fluent speech NEURO: no focal motor deficits, his light touch sensation and vibration sensation are diminished on his hands and feet.   LABORATORY DATA:  I have reviewed the data as listed CBC Latest Ref Rng & Units 01/07/2017 12/29/2016 12/26/2016  WBC 4.0 - 10.3 10e3/uL 1.6(L) 3.5(L) 4.6  Hemoglobin 13.0 - 17.1 g/dL 9.2(L) 7.3(L) 7.5(L)  Hematocrit 38.4 - 49.9 % 28.4(L) 23.0(L) 23.4(L)  Platelets 140 - 400 10e3/uL 93(L) 66(L) 78(L)    CMP Latest Ref Rng & Units 01/07/2017 12/29/2016 12/26/2016  Glucose 70 - 140 mg/dl 163(H) 132 125  BUN 7.0 - 26.0 mg/dL 12.3 17.2 17.0  Creatinine 0.7 - 1.3 mg/dL 0.9 0.8 0.9  Sodium 136 - 145 mEq/L 137 139 138  Potassium 3.5 - 5.1 mEq/L 3.9 4.0 3.7  Chloride 101 - 111 mmol/L - - -  CO2 22 - 29 mEq/L '28 27 25  '$ Calcium 8.4 - 10.4 mg/dL 8.9 8.7 8.6  Total Protein 6.4 - 8.3 g/dL 6.3(L) 6.2(L) 6.2(L)  Total Bilirubin 0.20 - 1.20 mg/dL 1.04 0.86 0.90  Alkaline Phos 40 - 150 U/L 127 111 113  AST 5 -  34 U/L 34 36(H) 39(H)  ALT 0 - 55 U/L '13 12 12    '$ CEA: 08/31/2014: 189 12/12/2014: 9.0 03/27/2015: 5.0 05/29/2015: 17.6 07/09/2015: 41 11/26/2015: 153 01/30/2016:  523 02/29/2016: 985 03/31/2016: 295 04/29/2016: 244.69 05/27/2016: 347 06/24/2016: 427 07/15/2016: 709.32 08/27/2016: 862 09/30/16: 1424.10 10/29/16: 1884.61 12/02/2016: 2334.31 12/26/16: 2590.50  Pathology report:  Small Intestine Biopsy, jejunal mass 08/18/2014 - INVASIVE ADENOCARCINOMA IN A BACKGROUND OF TUBULOVILLOUS ADENOMA. ADDITIONAL INFORMATION: Mismatch Repair (MMR) Protein Immunohistochemistry (IHC) IHC Expression Result (LIMITED TUMOR): MLH1: Preserved nuclear expression (greater 50% tumor expression) MSH2: Preserved nuclear expression (greater 50% tumor expression) MSH6: Preserved nuclear expression (greater 50% tumor expression) PMS2: Preserved nuclear expression (greater 50% tumor expression) * Internal control demonstrates intact nuclear expression Interpretation: NORMAL There is preserved expression  Diagnosis 08/28/2014  Liver, needle/core biopsy, right lobe - METASTATIC ADENOCARCINOMA       RADIOGRAPHIC STUDIES: I have personally reviewed the radiological images as listed and agreed with the findings in the report.  CT Abdomen Pelvis w contrast 12/02/2016  IMPRESSION: 1. Enlarging hepatic and pulmonary metastatic lesions. Several small new metastatic lesions to the liver. 2. Enlarging infiltrating mesenteric mass in the left upper quadrant is causing partial obstruction of the duodenum, with resulting dilatation of the stomach and duodenum extending up to this point. This infiltrating mesenteric mass also partially encases the superior mesenteric artery. 3. Mild infrahilar and retroperitoneal adenopathy. 4.  Aortic Atherosclerosis (ICD10-I70.0). 5. Lumbar spondylosis and degenerative disc disease potentially causing impingement in the lower lumbar spine. 6. Cholelithiasis. 7. Mild prostatomegaly.  CT chest abdomen pelvis with contrast 08/26/2016 IMPRESSION: 1. Similar pulmonary metastasis. 2. Similar to slight improvement in hepatic metastasis. 3.  Enlargement of a gastrohepatic ligament node which is suspicious. Possible adenopathy at the root of the small bowel mesenteriy. Recommend attention on follow-up. 4.  Coronary artery atherosclerosis. Aortic atherosclerosis. 5. Gynecomastia. 6. Prostatomegaly. 7. Cholelithiasis. 8. Pulmonary artery enlargement suggests pulmonary arterial hypertension.   CT CAP w/ Contrast 06/17/16 IMPRESSION: Mild increase in size of several hepatic metastases. Stable sub-cm bilateral pulmonary metastases. Interval resolution of diffuse colitis since prior exam. Incidentally noted aortic atherosclerosis, cholelithiasis, and mildly enlarged prostate.   ASSESSMENT & PLAN:  71 y.o. gentleman with past medical history of diabetes, hypertension, who presents with anemia and weakness.  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  -Unfortunately he has stage IV disease with diffuse liver metastasis, this is an incurable disease, with overall very poor prognosis. -I previously reviewed his staging CT scan results from 11/26/2015 with patient and his wife, Unfortunately scan showed disease progression in the liver, his tumor marker CA has been trending up lately, which is consistent with disease progression. -I previously discussed his [facility] testing results, which showed mutations in [place], RAF 1, APC, TP 22,  Targeted therapy is nt available at this point. -I previously discussed and reviewed his restaging CT images from 01/28/2016 which showed disease progression in liver and lungs. I recommend him to stop gemcitabine -His tumor marker CEA has previously been trending up, corresponding to his disease progression -I have checked the clinical trial options at [facility] and [facility], unfortunately there is no suitable trials for him now.   -He previously had multiple line chemo, currently on Xeloda with reduced dose to 1500 mg bid, 1 weeks on, one-week off. tolerating well --We previously Reviewed staging CT scan  from 08/26/2016, which showed improved liver metastasis, stable lung nodules. No other new lesions. -I previously reviewed his staging CT scan from 12/29/2016, which unfortunately showed significant disease progression in the liver and primary site. He has developed partial small bowel resection from the small bowel tumor, symptomatic with constipation and nausea.  - due to the SBO, I strongly encouraged him to change his diet to have less fiber  Low residue diet. I'll refer him to our dietitian Pamala Hurry. - I will talk with surgeon about surgical options for his small bowel obstruction, or J-tube placement for his nutrition if resection is not feasible. -Due to the significant disease progression, I previously  recommend him to stop Xeloda on 12/04/16. He has had multiple line chemotherapy, treatment option is very limited at this point. -he has recently started [facility] 12/15/16, tolerating well -Lab reviewed, his anemia is improved after blood transfusion and his WBC are much lower now. We will push back next cycle and lower the dose to 2 in the morning and 2 in the evening.  -Will f/u on 01/19/17 to recheck blood counts.  -Will contact Dr. Lisbeth Renshaw about any possible radiation  -Will put surgery on hold for now.  -To help increase his weight I suggest increase the number of ensure he takes to about 4-5 a day, he is currently on a primary low residual diet.    2. Small bowel obstruction -Secondary to his primary proximal small bowel tumor -He is on low residual diet, tolerating well, normal nausea or vomiting. His bowel movement is normal. -Follow-up of his dietitian -I previously discussed with surgeons about possibility of small bowel surgery versus J-tube placement for nutrition.  -Today he returns after having spoke with Dr  Barry Dienes, she is considering J-tube and bypass vs surveillance. I advised him to wait for now as he is tolerating his diet well with his current regimen and we will monitor the progression of his tumor. We will discuss options for surgery following his next scan.   -He experiences constipation so I suggest he take something to help him go at least once a day.    3. 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 He is slightly anemic today, no need for blood transfusion.  -Reviewed labs, advised him that his HbG is 7.5, 12/27/15, and he needs a blood transfusion. Discussed that this is likely the source of his mild fatigue and that he will need to have transfusion next week. We also discussed that his plantlet are low at 7.8 previously. -Blood transfusion 01/01/17 -HG now improved since blood transfusion.   4.  Hypertension and DM  -He will continue follow-up with his primary care physician. -He has been off lisinopril lately due to borderline low blood pressure, his blood pressure is not elevated today, he will continue monitoring at home, and restart lisinopril if needed -continue close monitoring   5. Peripheral neuropathy, hand-foot syndrome, secondary to chemotherapy -His peripheral neuropathy, worse after Xeloda, especially with skin reactions, much improving now. -Continue Neurontin  200 mg 3 times a day and now off Xeloda -We'll continue observation.  6. Goal of care  discussion  -We again had discussed the incurable nature of his cancer, and the overall poor prognosis, especially now he has progressed through multiple lines of chemotherapy and his treatment option is very limited. -The patient understands the goal of care is palliative. -I recommend DNR/DNI, he agrees.   Plan: -Lab on 8/20, if ANC> 1.5 then will start Lonsurf 2 tab twice daily on  day 1-5, 8-12  -Lab and f/u in 9/3    I spent 30 minutes before his visit, more than 50% on face-to-face counseling.  I have reviewed the above documentation for accuracy and completeness, and I agree with the above information.      This document serves as a record of services personally performed by Truitt Merle, MD. It was created on her behalf by Joslyn Devon, a trained medical scribe. The creation of this record is based on the scribe's personal observations and the provider's statements to them. This document has been checked and approved by the attending provider.   Truitt Merle  01/07/2017

## 2017-01-19 ENCOUNTER — Other Ambulatory Visit (HOSPITAL_BASED_OUTPATIENT_CLINIC_OR_DEPARTMENT_OTHER): Payer: Medicare Other

## 2017-01-19 DIAGNOSIS — C772 Secondary and unspecified malignant neoplasm of intra-abdominal lymph nodes: Secondary | ICD-10-CM

## 2017-01-19 DIAGNOSIS — C179 Malignant neoplasm of small intestine, unspecified: Secondary | ICD-10-CM | POA: Diagnosis not present

## 2017-01-19 DIAGNOSIS — C787 Secondary malignant neoplasm of liver and intrahepatic bile duct: Secondary | ICD-10-CM

## 2017-01-19 LAB — COMPREHENSIVE METABOLIC PANEL
ALBUMIN: 2.4 g/dL — AB (ref 3.5–5.0)
ALT: 13 U/L (ref 0–55)
AST: 41 U/L — ABNORMAL HIGH (ref 5–34)
Alkaline Phosphatase: 158 U/L — ABNORMAL HIGH (ref 40–150)
Anion Gap: 8 mEq/L (ref 3–11)
BILIRUBIN TOTAL: 0.7 mg/dL (ref 0.20–1.20)
BUN: 11.3 mg/dL (ref 7.0–26.0)
CO2: 29 meq/L (ref 22–29)
CREATININE: 0.9 mg/dL (ref 0.7–1.3)
Calcium: 8.9 mg/dL (ref 8.4–10.4)
Chloride: 102 mEq/L (ref 98–109)
EGFR: 85 mL/min/{1.73_m2} — ABNORMAL LOW (ref >90)
GLUCOSE: 138 mg/dL (ref 70–140)
Potassium: 4 mEq/L (ref 3.5–5.1)
SODIUM: 139 meq/L (ref 136–145)
TOTAL PROTEIN: 6.5 g/dL (ref 6.4–8.3)

## 2017-01-19 LAB — CBC WITH DIFFERENTIAL/PLATELET
BASO%: 0.1 % (ref 0.0–2.0)
Basophils Absolute: 0 10*3/uL (ref 0.0–0.1)
EOS%: 0 % (ref 0.0–7.0)
Eosinophils Absolute: 0 10*3/uL (ref 0.0–0.5)
HCT: 29.6 % — ABNORMAL LOW (ref 38.4–49.9)
HEMOGLOBIN: 9.2 g/dL — AB (ref 13.0–17.1)
LYMPH%: 11.5 % — AB (ref 14.0–49.0)
MCH: 28.4 pg (ref 27.2–33.4)
MCHC: 31.1 g/dL — ABNORMAL LOW (ref 32.0–36.0)
MCV: 91.4 fL (ref 79.3–98.0)
MONO#: 1.2 10*3/uL — AB (ref 0.1–0.9)
MONO%: 11.5 % (ref 0.0–14.0)
NEUT%: 76.9 % — ABNORMAL HIGH (ref 39.0–75.0)
NEUTROS ABS: 7.8 10*3/uL — AB (ref 1.5–6.5)
PLATELETS: 159 10*3/uL (ref 140–400)
RBC: 3.24 10*6/uL — AB (ref 4.20–5.82)
RDW: 17.9 % — AB (ref 11.0–14.6)
WBC: 10.1 10*3/uL (ref 4.0–10.3)
lymph#: 1.2 10*3/uL (ref 0.9–3.3)

## 2017-01-19 LAB — CEA (IN HOUSE-CHCC): CEA (CHCC-In House): 3131.48 ng/mL — ABNORMAL HIGH (ref 0.00–5.00)

## 2017-01-20 ENCOUNTER — Other Ambulatory Visit: Payer: Self-pay | Admitting: *Deleted

## 2017-01-20 DIAGNOSIS — T451X5A Adverse effect of antineoplastic and immunosuppressive drugs, initial encounter: Principal | ICD-10-CM

## 2017-01-20 DIAGNOSIS — C179 Malignant neoplasm of small intestine, unspecified: Secondary | ICD-10-CM

## 2017-01-20 DIAGNOSIS — G620 Drug-induced polyneuropathy: Secondary | ICD-10-CM

## 2017-01-20 MED ORDER — GABAPENTIN 100 MG PO CAPS: ORAL_CAPSULE | ORAL | 2 refills | Status: DC

## 2017-01-27 ENCOUNTER — Telehealth: Payer: Self-pay | Admitting: *Deleted

## 2017-01-27 NOTE — Telephone Encounter (Signed)
-----   Message from Truitt Merle, MD sent at 01/25/2017 10:38 PM EDT ----- Please make sure that pt has started Lonsurf on 8/20 based on his CBC results, thanks  Truitt Merle  01/25/2017

## 2017-01-27 NOTE — Telephone Encounter (Signed)
Telephone call to patient. Advised lab results as directed below. Patient verbalized an understanding and states he started Lonsurf on 01/26/17. He understands to call this office with any questions or concerns.

## 2017-01-29 ENCOUNTER — Other Ambulatory Visit: Payer: Self-pay | Admitting: Pharmacist

## 2017-01-29 DIAGNOSIS — C179 Malignant neoplasm of small intestine, unspecified: Secondary | ICD-10-CM

## 2017-01-29 MED ORDER — TRIFLURIDINE-TIPIRACIL 20-8.19 MG PO TABS: ORAL_TABLET | ORAL | 2 refills | Status: AC

## 2017-01-29 NOTE — Telephone Encounter (Signed)
Oral Chemotherapy Pharmacist Encounter  Received call from [facility] patient support with request for new prescription for correct dosing of patient's Lonsurf since dose decrease to 2 tablets (40mg  trifluridine component) twice daily on days 1-5 and 8/12 every 28 days.  New prescription e-scribed to [facility] in [place], [place] per requirement of [facility] patient support program.  Per [facility] care coordinator, refill will be signed off on 9/7 for guaranteed delivery to the patient prior to next cycle start date of 02/16/17.  Oral Oncology Clinic will continue to follow.  Johny Drilling, PharmD, BCPS, BCOP 01/29/2017  1:27 PM Oral Oncology Clinic [phone]

## 2017-01-30 ENCOUNTER — Other Ambulatory Visit: Payer: Self-pay | Admitting: Pharmacist

## 2017-02-03 ENCOUNTER — Ambulatory Visit (HOSPITAL_BASED_OUTPATIENT_CLINIC_OR_DEPARTMENT_OTHER): Payer: Medicare Other | Admitting: Hematology

## 2017-02-03 ENCOUNTER — Other Ambulatory Visit (HOSPITAL_BASED_OUTPATIENT_CLINIC_OR_DEPARTMENT_OTHER): Payer: Medicare Other

## 2017-02-03 ENCOUNTER — Ambulatory Visit: Payer: Medicare Other

## 2017-02-03 ENCOUNTER — Ambulatory Visit (HOSPITAL_COMMUNITY)
Admission: RE | Admit: 2017-02-03 | Discharge: 2017-02-03 | Disposition: A | Discharge status: Home / Self Care | Payer: Medicare Other | Source: Ambulatory Visit | Attending: Hematology | Admitting: Hematology

## 2017-02-03 ENCOUNTER — Telehealth: Payer: Self-pay | Admitting: Hematology

## 2017-02-03 VITALS — BP 119/43 | HR 79 | Temp 97.8°F | Resp 17 | Ht 66.0 in | Wt 162.9 lb

## 2017-02-03 DIAGNOSIS — L271 Localized skin eruption due to drugs and medicaments taken internally: Secondary | ICD-10-CM | POA: Diagnosis not present

## 2017-02-03 DIAGNOSIS — D630 Anemia in neoplastic disease: Secondary | ICD-10-CM | POA: Insufficient documentation

## 2017-02-03 DIAGNOSIS — C801 Malignant (primary) neoplasm, unspecified: Secondary | ICD-10-CM | POA: Insufficient documentation

## 2017-02-03 DIAGNOSIS — C772 Secondary and unspecified malignant neoplasm of intra-abdominal lymph nodes: Secondary | ICD-10-CM

## 2017-02-03 DIAGNOSIS — K56609 Unspecified intestinal obstruction, unspecified as to partial versus complete obstruction: Secondary | ICD-10-CM

## 2017-02-03 DIAGNOSIS — C179 Malignant neoplasm of small intestine, unspecified: Secondary | ICD-10-CM

## 2017-02-03 DIAGNOSIS — C787 Secondary malignant neoplasm of liver and intrahepatic bile duct: Secondary | ICD-10-CM

## 2017-02-03 DIAGNOSIS — G620 Drug-induced polyneuropathy: Secondary | ICD-10-CM

## 2017-02-03 DIAGNOSIS — T451X5A Adverse effect of antineoplastic and immunosuppressive drugs, initial encounter: Secondary | ICD-10-CM

## 2017-02-03 DIAGNOSIS — D500 Iron deficiency anemia secondary to blood loss (chronic): Secondary | ICD-10-CM

## 2017-02-03 DIAGNOSIS — D6481 Anemia due to antineoplastic chemotherapy: Secondary | ICD-10-CM

## 2017-02-03 DIAGNOSIS — E119 Type 2 diabetes mellitus without complications: Secondary | ICD-10-CM

## 2017-02-03 LAB — COMPREHENSIVE METABOLIC PANEL
ALBUMIN: 2.4 g/dL — AB (ref 3.5–5.0)
ALK PHOS: 144 U/L (ref 40–150)
ALT: 12 U/L (ref 0–55)
ANION GAP: 9 meq/L (ref 3–11)
AST: 47 U/L — ABNORMAL HIGH (ref 5–34)
BILIRUBIN TOTAL: 0.99 mg/dL (ref 0.20–1.20)
BUN: 14.2 mg/dL (ref 7.0–26.0)
CALCIUM: 9.1 mg/dL (ref 8.4–10.4)
CO2: 28 mEq/L (ref 22–29)
Chloride: 101 mEq/L (ref 98–109)
Creatinine: 0.9 mg/dL (ref 0.7–1.3)
EGFR: 86 mL/min/{1.73_m2} — AB (ref >90)
GLUCOSE: 121 mg/dL (ref 70–140)
POTASSIUM: 4 meq/L (ref 3.5–5.1)
Sodium: 138 mEq/L (ref 136–145)
Total Protein: 6.6 g/dL (ref 6.4–8.3)

## 2017-02-03 LAB — CBC WITH DIFFERENTIAL/PLATELET
BASO%: 0.4 % (ref 0.0–2.0)
Basophils Absolute: 0 10*3/uL (ref 0.0–0.1)
EOS%: 0.9 % (ref 0.0–7.0)
Eosinophils Absolute: 0.1 10*3/uL (ref 0.0–0.5)
HCT: 25.4 % — ABNORMAL LOW (ref 38.4–49.9)
HEMOGLOBIN: 8.3 g/dL — AB (ref 13.0–17.1)
LYMPH%: 15.4 % (ref 14.0–49.0)
MCH: 28.9 pg (ref 27.2–33.4)
MCHC: 32.8 g/dL (ref 32.0–36.0)
MCV: 88.2 fL (ref 79.3–98.0)
MONO#: 0.4 10*3/uL (ref 0.1–0.9)
MONO%: 5.6 % (ref 0.0–14.0)
NEUT#: 5.4 10*3/uL (ref 1.5–6.5)
NEUT%: 77.7 % — ABNORMAL HIGH (ref 39.0–75.0)
PLATELETS: 148 10*3/uL (ref 140–400)
RBC: 2.88 10*6/uL — ABNORMAL LOW (ref 4.20–5.82)
RDW: 18.9 % — AB (ref 11.0–14.6)
WBC: 6.9 10*3/uL (ref 4.0–10.3)
lymph#: 1.1 10*3/uL (ref 0.9–3.3)

## 2017-02-03 LAB — PREPARE RBC (CROSSMATCH)

## 2017-02-03 NOTE — Telephone Encounter (Signed)
Gave patient AVS and calendar of upcoming September appointments.  °

## 2017-02-03 NOTE — Progress Notes (Signed)
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       08/18/2014 Miscellaneous    tumor KRAS mutation (-)      08/18/2014 Pathology Results    Small intestine biopsy showed metastatic adenocarcinoma, consistent with GI primary      08/18/2014 Initial Diagnosis    Small bowel cancer      08/18/2014 Procedure    small bowel enteroscopy with biopsy by Dr. Benson Norway showed at the proximal jejunum there was evidence of abnormal mucosa and friability, biopsy was obtained.       08/28/2014 Pathology Results    Diagnosis Liver, needle/core biopsy, right lobe - METASTATIC ADENOCARCINOMA        09/05/2014 Imaging    PET hypermetabolic mass involving a small bowel loop with adjacent mesenteric lymphadenopathy, and diffuse liver metastasis and mild hypermetabolic lymphadenopathy in porta hepatis.      09/06/2014 - 02/01/2015 Chemotherapy    mFOLFOX, stopped due to his neuropathy, and earlier mild disease progression on PET       09/07/2014 - 09/11/2014 Hospital Admission    He was admitted for fever and GI bleeding. Received 3 units of RBC.      10/26/2014 Imaging    Interval significant improvement in the primary small bowel mass, adjacent lymphadenopathy and extensive hepatic metastatic disease. No other new lesions.       02/09/2015 Imaging    PET/CT scan showed stable primary malignancy in the proximal jejunum, mild metabolic progression of the 3 residual metabolic liver  metastasis. CT  Portion showed decreased size of his liver metastasis.      02/27/2015 - 11/06/2015 Chemotherapy    second line chemo FOLFIRI, every 2 weeks, and panitumumab (held for second cycle due to severe skin rashes), chemotherapy stopped due to disease progression.      08/06/2015 Imaging    Stable number and size of the liver lesions. The left lower lobe nodule is considerably less prominent. No other new lesions.      11/26/2015 Progression    Restaging CT chest, abdomen and pelvis with contrast showed interval increase in size of liver lesions, multiple small pulmonary nodules are not significantly changed in the interval. Her tumor marker CEA also increased significantly      12/07/2015 - 01/16/2016 Chemotherapy    Gemcitabine 1000 mg/m2 (decreased to 800 mg/m from second dose), and a one and 8 every 21 days, stopped due to disease progression       02/12/2016 - 03/19/2016 Chemotherapy    Xeloda 1023m/m2 twice daily, 2 weeks on, one-week off, stopped after 2 cycles due to severe side effects (diarrhea, skin toxicities)      03/19/2016 - 03/21/2016 Hospital Admission    Patient was admitted for severe diarrhea, dehydration after second cycle of Xeloda, stoll c-diff was negative. He received IV fluids and supportive care.      06/17/2016 Imaging    CT Chest, Abdomen, Pelvis w/  Contrast  IMPRESSION: Mild increase in size of several hepatic metastases.  Stable sub-cm bilateral pulmonary metastases.  Interval resolution of diffuse colitis since prior exam.  Incidentally noted aortic atherosclerosis, cholelithiasis, and mildly enlarged prostate.      06/24/2016 - 12/04/2016 Chemotherapy    Xeloda 1522m bid 2 weeks on, 1 weeks off, changed to 1 week on and 1 week off from cycle 2 due to tolerance issue. Reduced to 5 days on and 9 days off due to sensitivity to Xeloda.       08/26/2016 Imaging     CT chest abdomen pelvis with contrast IMPRESSION: 1. Similar pulmonary  metastasis. 2. Similar to slight improvement in hepatic metastasis. 3. Enlargement of a gastrohepatic ligament node which is suspicious. Possible adenopathy at the root of the small bowel mesenteriy. Recommend attention on follow-up. 4.  Coronary artery atherosclerosis. Aortic atherosclerosis. 5. Gynecomastia. 6. Prostatomegaly. 7. Cholelithiasis. 8. Pulmonary artery enlargement suggests pulmonary arterial hypertension.       12/02/2016 Imaging    CT Abdomen Pelvis w contrast 12/02/2016  IMPRESSION: 1. Enlarging hepatic and pulmonary metastatic lesions. Several small new metastatic lesions to the liver. 2. Enlarging infiltrating mesenteric mass in the left upper quadrant is causing partial obstruction of the duodenum, with resulting dilatation of the stomach and duodenum extending up to this point. This infiltrating mesenteric mass also partially encases the superior mesenteric artery. 3. Mild infrahilar and retroperitoneal adenopathy. 4.  Aortic Atherosclerosis (ICD10-I70.0). 5. Lumbar spondylosis and degenerative disc disease potentially causing impingement in the lower lumbar spine. 6. Cholelithiasis. 7. Mild prostatomegaly.      12/15/2016 -  Chemotherapy    Due to cancer progression, changed Chemo to Lonsurf Lonsurf 20-8.19 3 tab twice daily, on day 1-5 and 8-12, every 28 days, started on 12/15/2016      HISTORY OF PRESENTING ILLNESS: 12/12/15 Tyler Gal71y.o. male is here because of a recent diagnosis of small bowel cancer. He presented to his PCP on 08/17/2014 with complaints of feeling weak and a one month history of a cough. He was found to be anemic and was referred to the emergency department. Hemoglobin was found to be 7.6, MCV 76.6; creatinine was elevated at 1.74. Stool was Hemoccult positive.  CT abdomen/pelvis without contrast on 08/17/2014 showed multiple large masses within the liver, bulky in appearance. The largest measured approximately 5.7 cm. There  appeared to be a focal filling defect within the proximal jejunum measuring approximately 2.5 x 1.7 cm. There was mild adjacent jejunal wall thickening. The lung bases were clear.  On 08/18/2014 he underwent a small bowel enteroscopy with biopsy by Dr. HBenson Norway The esophagus and gastric lumen were normal. At the proximal jejunum there was evidence of abnormal mucosa and friability. The pediatric colonoscope was not able to traverse the area. Biopsies were obtained. An ultraslim colonoscope was then utilized. This colonoscope was able to traverse the area of stenosis which measured approximately 3 cm in length. 50% of the lumen was ulcerated. 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 MMR stains are pending.  He was discharged home on 08/19/2014.  CURRENT THERAPY: Lonsurf 20-8.19 3 tab twice daily, on day 1-5 and 8-12, every 28 days, started on 12/15/2016, lowered to 2 tablets twice daily on 01/07/17 due to low WBC.   INTERIM HISTORY:  Mr. RHelveyreturns for follow-up. He presents to the clinic today with wife. He reports he finished last cycle on Friday. He reports that on the 7th/8th day he  gets tired/fatigued. He is abel to do less activities. Since Friday he noticed he felt better yesterday. He is eating about half than his normal amount and he is drinking ensure. He is not feeling bloated or nauseated. No constipation.    This is a white MEDICAL HISTORY:  Past Medical History:  Diagnosis Date  . Diabetes mellitus without complication ([place])   . Hypertension   . Small bowel cancer ([place]) 08/18/2014    SURGICAL HISTORY: Past Surgical History:  Procedure Laterality Date  . ENTEROSCOPY N/A 08/18/2014   Procedure: ENTEROSCOPY;  Surgeon: Carol Ada, MD;  Location: [facility]COPY;  Service: Endoscopy;  Laterality: N/A;    SOCIAL HISTORY: History   Social History  . Marital Status: Married    Spouse Name: N/A  . Number of Children: 2  . Years of  Education: N/A   Occupational History  .  retired Ambulance person    Social History Main Topics  . Smoking status: Never Smoker   . Smokeless tobacco: Not on file  . Alcohol Use: No  . Drug Use: Not on file  . Sexual Activity: Not on file   Other Topics Concern  . Not on file   Social History Narrative  He lives in [place]. He is married. He has 2 children both reported to be in good health. No tobacco or alcohol use. He is a retired Ambulance person.  FAMILY HISTORY: Family History  Problem Relation Age of Onset  . Heart failure, Alzheimer's  Mother   . Lung cancer Father   . Kidney cancer Brother     ALLERGIES:  is allergic to penicillins.  MEDICATIONS:  Current Outpatient Prescriptions on File Prior to Visit  Medication Sig Dispense Refill  . diphenoxylate-atropine (LOMOTIL) 2.5-0.025 MG tablet Take 2 tablets by mouth 4 (four) times daily as needed for diarrhea or loose stools. 60 tablet 0  . FREESTYLE LITE test strip AS DIRECTED ONCE A DAY TO CHECK BLOOD SUGARS IN VITRO 30 DAYS  12  . gabapentin (NEURONTIN) 100 MG capsule TAKE 2 CAPSULES BY MOUTH 3 TIMES DAILY 180 capsule 2  . LANTUS SOLOSTAR 100 UNIT/ML Solostar Pen Inject 20 Units into the skin every evening.     . lidocaine-prilocaine (EMLA) cream Apply topically as needed. Apply to portacath 1 1/2 hours - 2 hours prior to procedures as needed. (Patient taking differently: Apply 1 application topically daily as needed (port access). Apply to portacath 1 1/2 hours - 2 hours prior to procedures) 30 g 2  . loperamide (IMODIUM) 2 MG capsule Take 4 mg by mouth 4 (four) times daily as needed for diarrhea or loose stools.    . ondansetron (ZOFRAN) 8 MG tablet Take 1 tablet (8 mg total) by mouth every 8 (eight) hours as needed for nausea or vomiting. 20 tablet 0  . potassium chloride SA (KLOR-CON M20) 20 MEQ tablet Take 1 tablet (20 mEq total) by mouth daily. (Patient not taking: Reported on 02/03/2017) 30 tablet 1  .  trifluridine-tipiracil (LONSURF) 20-8.19 MG tablet Take 2 tablets (6m trifluridine) by mouth 2 times daily, 1 hr after AM & PM meals on days 1-5, 8-12. Repeat every 28days 40 tablet 2   Current Facility-Administered Medications on File Prior to Visit  Medication Dose Route Frequency Provider Last Rate Last Dose  . 0.9 %  sodium chloride infusion  250 mL Intravenous Once FTruitt Merle MD      . acetaminophen (TYLENOL) tablet 650 mg  650 mg Oral Once FTruitt Merle  MD      . diphenhydrAMINE (BENADRYL) capsule 25 mg  25 mg Oral Once Truitt Merle, MD      ;  REVIEW OF SYSTEMS:   Constitutional: Denies fevers, chills or abnormal night sweats; (+) severe fatigue (+) low energy Eyes: Denies blurriness of vision, double vision Ears, nose, mouth, throat, and face: Denies mucositis or sore throat Respiratory: One month history of a nonproductive cough;  no shortness of breath Cardiovascular: Denies palpitation, chest discomfort or lower extremity swelling Gastrointestinal:  Denies heartburn. No dysphagia. Denies nausea, vomiting, abdominal pain (+) tenderness in abdomen Skin: Denies abnormal skin rashes, (+) dry skin, hyperpigmentation on palms Lymphatics: Denies new lymphadenopathy or easy bruising Neurological: Denies extremity weakness  Behavioral/Psych: Mood is stable, no new changes  All other systems were reviewed with the patient and are negative.  PHYSICAL EXAMINATION:  ECOG PERFORMANCE STATUS: 2-3  Vitals:   02/03/17 0937  BP: (!) 119/43  Pulse: 79  Resp: 17  Temp: 97.8 F (36.6 C)  SpO2: 100%   Filed Weights   02/03/17 0937  Weight: 162 lb 14.4 oz (73.9 kg)     GENERAL:alert, no distress and comfortable SKIN: skin color, texture, turgor are normal, (+) skin pigmentation On his face, and hands, from her recent Xeloda. (+) Diffuse mild skin erythema on his palms and bottom of feet. No skin peeling or crackers on palms or bottoms of feet.   EYES: normal, conjunctiva are pink and  non-injected, sclera clear OROPHARYNX:no exudate, no erythema and lips, buccal mucosa, and tongue normal  NECK: supple, thyroid normal size, non-tender, without nodularity LYMPH:  no palpable lymphadenopathy in the cervical, axillary or inguinal regions LUNGS: clear to auscultation and percussion with normal breathing effort HEART: regular rate & rhythm and no murmurs (+) slight swelling of lower extremity.  ABDOMEN:abdomen soft, mild tenderness at the right upper quadrant , no rebound pain (+) bowel sounds slightly low MSK:no cyanosis of digits and no clubbing  PSYCH: alert & oriented x 3 with fluent speech NEURO: no focal motor deficits, his light touch sensation and vibration sensation are diminished on his hands and feet.   LABORATORY DATA:  I have reviewed the data as listed CBC Latest Ref Rng & Units 02/03/2017 01/19/2017 01/07/2017  WBC 4.0 - 10.3 10e3/uL 6.9 10.1 1.6(L)  Hemoglobin 13.0 - 17.1 g/dL 8.3(L) 9.2(L) 9.2(L)  Hematocrit 38.4 - 49.9 % 25.4(L) 29.6(L) 28.4(L)  Platelets 140 - 400 10e3/uL 148 159 93(L)    CMP Latest Ref Rng & Units 02/03/2017 01/19/2017 01/07/2017  Glucose 70 - 140 mg/dl 121 138 163(H)  BUN 7.0 - 26.0 mg/dL 14.2 11.3 12.3  Creatinine 0.7 - 1.3 mg/dL 0.9 0.9 0.9  Sodium 136 - 145 mEq/L 138 139 137  Potassium 3.5 - 5.1 mEq/L 4.0 4.0 3.9  Chloride 101 - 111 mmol/L - - -  CO2 22 - 29 mEq/L _0 Calcium 8.4 - 10.4 mg/dL 9.1 8.9 8.9  Total Protein 6.4 - 8.3 g/dL 6.6 6.5 6.3(L)  Total Bilirubin 0.20 - 1.20 mg/dL 0.99 0.70 1.04  Alkaline Phos 40 - 150 U/L 144 158(H) 127  AST 5 - 34 U/L 47(H) 41(H) 34  ALT 0 - 55 U/L _1 CEA: 08/31/2014: 189 12/12/2014: 9.0 03/27/2015: 5.0 05/29/2015: 17.6 07/09/2015: 41 11/26/2015: 153 01/30/2016: 523 02/29/2016: 985 03/31/2016: 295 04/29/2016: 244.69 05/27/2016: 347 06/24/2016: 427 07/15/2016: 709.32 08/27/2016: 862 09/30/16: 1424.10 10/29/16: 5456.25 12/02/2016: 6389.37 12/26/16: 2590.50 01/19/2017:  3131.48  Pathology  report:  Small Intestine Biopsy, jejunal mass 08/18/2014 - INVASIVE ADENOCARCINOMA IN A BACKGROUND OF TUBULOVILLOUS ADENOMA. ADDITIONAL INFORMATION: Mismatch Repair (MMR) Protein Immunohistochemistry (IHC) IHC Expression Result (LIMITED TUMOR): MLH1: Preserved nuclear expression (greater 50% tumor expression) MSH2: Preserved nuclear expression (greater 50% tumor expression) MSH6: Preserved nuclear expression (greater 50% tumor expression) PMS2: Preserved nuclear expression (greater 50% tumor expression) * Internal control demonstrates intact nuclear expression Interpretation: NORMAL There is preserved expression  Diagnosis 08/28/2014  Liver, needle/core biopsy, right lobe - METASTATIC ADENOCARCINOMA       RADIOGRAPHIC STUDIES: I have personally reviewed the radiological images as listed and agreed with the findings in the report.  CT Abdomen Pelvis w contrast 12/02/2016  IMPRESSION: 1. Enlarging hepatic and pulmonary metastatic lesions. Several small new metastatic lesions to the liver. 2. Enlarging infiltrating mesenteric mass in the left upper quadrant is causing partial obstruction of the duodenum, with resulting dilatation of the stomach and duodenum extending up to this point. This infiltrating mesenteric mass also partially encases the superior mesenteric artery. 3. Mild infrahilar and retroperitoneal adenopathy. 4.  Aortic Atherosclerosis (ICD10-I70.0). 5. Lumbar spondylosis and degenerative disc disease potentially causing impingement in the lower lumbar spine. 6. Cholelithiasis. 7. Mild prostatomegaly.  CT chest abdomen pelvis with contrast 08/26/2016 IMPRESSION: 1. Similar pulmonary metastasis. 2. Similar to slight improvement in hepatic metastasis. 3. Enlargement of a gastrohepatic ligament node which is suspicious. Possible adenopathy at the root of the small bowel mesenteriy. Recommend attention on follow-up. 4.  Coronary artery  atherosclerosis. Aortic atherosclerosis. 5. Gynecomastia. 6. Prostatomegaly. 7. Cholelithiasis. 8. Pulmonary artery enlargement suggests pulmonary arterial hypertension.  CT CAP w/ Contrast 06/17/16 IMPRESSION: Mild increase in size of several hepatic metastases. Stable sub-cm bilateral pulmonary metastases. Interval resolution of diffuse colitis since prior exam. Incidentally noted aortic atherosclerosis, cholelithiasis, and mildly enlarged prostate.   ASSESSMENT & PLAN:  71 y.o. gentleman with past medical history of diabetes, hypertension, who presents with anemia and weakness.  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  -Unfortunately he has stage IV disease with diffuse liver metastasis, this is an incurable disease, with overall very poor prognosis. -I previously reviewed his staging CT scan results from 11/26/2015 with patient and his wife, Unfortunately scan showed disease progression in the liver, his tumor marker CA has been trending up lately, which is consistent with disease progression. -I previously discussed his [facility] testing results, which showed mutations in [place], RAF 1, APC, TP 51,  Targeted therapy is nt available at this point. -I previously discussed and reviewed his restaging CT images from 01/28/2016 which showed disease progression in liver and lungs. I recommend him to stop gemcitabine -His tumor marker CEA has previously been trending up, corresponding to his disease progression -I have checked the clinical trial options at [facility] and [facility], unfortunately there is no suitable trials for him now.  -He previously had multiple line chemo, currently on Xeloda with reduced dose to 1500 mg bid, 1 weeks on, one-week off. tolerating well --We previously Reviewed staging CT scan from  08/26/2016, which showed improved liver metastasis, stable lung nodules. No other new lesions. -I previously reviewed his staging CT scan from 12/29/2016, which unfortunately showed significant disease progression in the liver and primary site. He has developed partial small bowel resection from the small bowel tumor, symptomatic with constipation and nausea.  - due to the SBO,  I strongly encouraged him to change his diet to have less fiber  Low residue diet. I'll refer him to our dietitian Pamala Hurry. - I will talk with surgeon about surgical options for his small bowel obstruction, or J-tube placement for his nutrition if resection is not feasible. -Due to the significant disease progression, I previously  recommend him to stop Xeloda on 12/04/16. He has had multiple line chemotherapy, treatment option is very limited at this point. -he has recently started [facility] 12/15/16, tolerating well, except cytopenias  -Due to Low WBC, lowered Lonsurf to 2 in the morning and 2 in the evening.  -Labs reviewed and his HG is 8.3 today, I think he will benefit form a blood transfusion this week or early next week. His liver enzyme only slightly elevated but overall his levels are adequate to continue treatment. He will start next treatment on the 9/16.  -After next cycle I will repeat CT scan.  -Will continue to hold on surgery.  -Will contact Dr. Lisbeth Renshaw about any possible radiation  -F/u next week   2. Small bowel obstruction -Secondary to his primary proximal small bowel tumor -He is on low residual diet, tolerating well, normal nausea or vomiting. His bowel movement is normal. -Follow-up of his dietitian -I previously discussed with surgeons about possibility of small bowel surgery versus J-tube placement for nutrition.  -Today he returns after having spoke with Dr Barry Dienes, she is considering J-tube and bypass vs surveillance. I advised him to wait for now as he is tolerating his diet well with his current regimen  and we will monitor the progression of his tumor. We will discuss options for surgery following his next scan.   -Bowel sounds a little low today (02/03/17), so I suggest he take something to help him go at least once a day.  -Will continue to monitor.    3. 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 He is slightly anemic today, no need for blood transfusion.  -Reviewed labs, advised him that his HbG is 7.5, 12/27/15, and he needs a blood transfusion. Discussed that this is likely the source of his mild fatigue and that he will need to have transfusion next week. We also discussed that his plantlet are low at 7.8 previously. -Blood transfusion 01/01/17 -HG now improved since blood transfusion.  -HG dropped to 8.3 today (02/03/17), and he will receive blood transfusion this week.   4.  Hypertension and DM  -He will continue follow-up with his primary care physician. -He has been off lisinopril lately due to borderline low blood pressure, his blood pressure is not elevated today, he will continue monitoring at home, and restart lisinopril if needed -I suggest he does not need to fast before visits, he should keep up with his diet.  -continue close monitoring   5. Peripheral neuropathy, hand-foot syndrome, secondary to chemotherapy -His peripheral neuropathy, worse after Xeloda, especially with skin reactions, much improving now. -Continue Neurontin  200 mg 3 times a day and  now off Xeloda -We'll continue  observation.   6. Goal of care discussion  -We again had discussed the incurable nature of his cancer, and the overall poor prognosis, especially now he has progressed through multiple lines of chemotherapy and his treatment option is very limited. -The patient understands the goal of care is palliative. -I recommend DNR/DNI, he agrees.   Plan: -HG 8.3 today, he will get Lab today and blood transfusion tomorrow  -lab and f/u on 9/13 or 9/14 before next cycle treatment    I spent 25 minutes before his visit, more than 50% on face-to-face counseling.  I have reviewed the above documentation for accuracy and completeness, and I agree with the above information.      This document serves as a record of services personally performed by Truitt Merle, MD. It was created on her behalf by Joslyn Devon, a trained medical scribe. The creation of this record is based on the scribe's personal observations and the provider's statements to them. This document has been checked and approved by the attending provider.   Truitt Merle  02/03/2017

## 2017-02-04 ENCOUNTER — Ambulatory Visit (HOSPITAL_COMMUNITY)
Admission: RE | Admit: 2017-02-04 | Discharge: 2017-02-04 | Disposition: A | Discharge status: Home / Self Care | Payer: Medicare Other | Source: Ambulatory Visit | Attending: Hematology | Admitting: Hematology

## 2017-02-04 ENCOUNTER — Encounter: Payer: Self-pay | Admitting: Hematology

## 2017-02-04 DIAGNOSIS — C801 Malignant (primary) neoplasm, unspecified: Secondary | ICD-10-CM | POA: Diagnosis not present

## 2017-02-04 DIAGNOSIS — D630 Anemia in neoplastic disease: Secondary | ICD-10-CM

## 2017-02-04 MED ORDER — SODIUM CHLORIDE 0.9 % IV SOLN
250.0000 mL | Freq: Once | INTRAVENOUS | Status: AC
  Administered 2017-02-04: 250 mL via INTRAVENOUS

## 2017-02-04 MED ORDER — HEPARIN SOD (PORK) LOCK FLUSH 100 UNIT/ML IV SOLN
500.0000 [IU] | Freq: Every day | INTRAVENOUS | Status: AC | PRN
  Administered 2017-02-04: 500 [IU]
  Filled 2017-02-04: qty 5

## 2017-02-04 MED ORDER — HEPARIN SOD (PORK) LOCK FLUSH 100 UNIT/ML IV SOLN: 250.0000 [IU] | INTRAVENOUS | Status: DC | PRN

## 2017-02-04 MED ORDER — SODIUM CHLORIDE 0.9% FLUSH
10.0000 mL | INTRAVENOUS | Status: AC | PRN
  Administered 2017-02-04: 10 mL

## 2017-02-04 MED ORDER — SODIUM CHLORIDE 0.9% FLUSH: 3.0000 mL | INTRAVENOUS | Status: DC | PRN

## 2017-02-04 NOTE — Discharge Instructions (Signed)

## 2017-02-04 NOTE — Progress Notes (Signed)
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 Discharge instructions given to patient and patient's wife. Both states an understanding. Patient alert, oriented, and ambulatory at time of discharge.

## 2017-02-05 LAB — TYPE AND SCREEN
ABO/RH(D): A POS
Antibody Screen: NEGATIVE
UNIT DIVISION: 0
UNIT DIVISION: 0

## 2017-02-05 LAB — BPAM RBC
Blood Product Expiration Date: 201809222359
Blood Product Expiration Date: 201809222359
ISSUE DATE / TIME: 201809050750
ISSUE DATE / TIME: 201809050750
UNIT TYPE AND RH: 6200
Unit Type and Rh: 6200

## 2017-02-09 NOTE — Progress Notes (Signed)
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       08/18/2014 Miscellaneous    tumor KRAS mutation (-)      08/18/2014 Pathology Results    Small intestine biopsy showed metastatic adenocarcinoma, consistent with GI primary      08/18/2014 Initial Diagnosis    Small bowel cancer      08/18/2014 Procedure    small bowel enteroscopy with biopsy by Dr. Benson Norway showed at the proximal jejunum there was evidence of abnormal mucosa and friability, biopsy was obtained.       08/28/2014 Pathology Results    Diagnosis Liver, needle/core biopsy, right lobe - METASTATIC ADENOCARCINOMA        09/05/2014 Imaging    PET hypermetabolic mass involving a small bowel loop with adjacent mesenteric lymphadenopathy, and diffuse liver metastasis and mild hypermetabolic lymphadenopathy in porta hepatis.      09/06/2014 - 02/01/2015 Chemotherapy    mFOLFOX, stopped due to his neuropathy, and earlier mild disease progression on PET       09/07/2014 - 09/11/2014 Hospital Admission    He was admitted for fever and GI bleeding. Received 3 units of RBC.      10/26/2014 Imaging    Interval significant improvement in the primary small bowel mass, adjacent lymphadenopathy and extensive hepatic metastatic disease. No other new lesions.       02/09/2015 Imaging    PET/CT scan showed stable primary malignancy in the proximal jejunum, mild metabolic progression of the 3 residual metabolic liver  metastasis. CT  Portion showed decreased size of his liver metastasis.      02/27/2015 - 11/06/2015 Chemotherapy    second line chemo FOLFIRI, every 2 weeks, and panitumumab (held for second cycle due to severe skin rashes), chemotherapy stopped due to disease progression.      08/06/2015 Imaging    Stable number and size of the liver lesions. The left lower lobe nodule is considerably less prominent. No other new lesions.      11/26/2015 Progression    Restaging CT chest, abdomen and pelvis with contrast showed interval increase in size of liver lesions, multiple small pulmonary nodules are not significantly changed in the interval. Her tumor marker CEA also increased significantly      12/07/2015 - 01/16/2016 Chemotherapy    Gemcitabine 1000 mg/m2 (decreased to 800 mg/m from second dose), and a one and 8 every 21 days, stopped due to disease progression       02/12/2016 - 03/19/2016 Chemotherapy    Xeloda '1000mg'$ /m2 twice daily, 2 weeks on, one-week off, stopped after 2 cycles due to severe side effects (diarrhea, skin toxicities)      03/19/2016 - 03/21/2016 Hospital Admission    Patient was admitted for severe diarrhea, dehydration after second cycle of Xeloda, stoll c-diff was negative. He received IV fluids and supportive care.      06/17/2016 Imaging    CT Chest, Abdomen, Pelvis w/  Contrast  IMPRESSION: Mild increase in size of several hepatic metastases.  Stable sub-cm bilateral pulmonary metastases.  Interval resolution of diffuse colitis since prior exam.  Incidentally noted aortic atherosclerosis, cholelithiasis, and mildly enlarged prostate.      06/24/2016 - 12/04/2016 Chemotherapy    Xeloda '1500mg'$  bid 2 weeks on, 1 weeks off, changed to 1 week on and 1 week off from cycle 2 due to tolerance issue. Reduced to 5 days on and 9 days off due to sensitivity to Xeloda.       08/26/2016 Imaging     CT chest abdomen pelvis with contrast IMPRESSION: 1. Similar pulmonary  metastasis. 2. Similar to slight improvement in hepatic metastasis. 3. Enlargement of a gastrohepatic ligament node which is suspicious. Possible adenopathy at the root of the small bowel mesenteriy. Recommend attention on follow-up. 4.  Coronary artery atherosclerosis. Aortic atherosclerosis. 5. Gynecomastia. 6. Prostatomegaly. 7. Cholelithiasis. 8. Pulmonary artery enlargement suggests pulmonary arterial hypertension.       12/02/2016 Imaging    CT Abdomen Pelvis w contrast 12/02/2016  IMPRESSION: 1. Enlarging hepatic and pulmonary metastatic lesions. Several small new metastatic lesions to the liver. 2. Enlarging infiltrating mesenteric mass in the left upper quadrant is causing partial obstruction of the duodenum, with resulting dilatation of the stomach and duodenum extending up to this point. This infiltrating mesenteric mass also partially encases the superior mesenteric artery. 3. Mild infrahilar and retroperitoneal adenopathy. 4.  Aortic Atherosclerosis (ICD10-I70.0). 5. Lumbar spondylosis and degenerative disc disease potentially causing impingement in the lower lumbar spine. 6. Cholelithiasis. 7. Mild prostatomegaly.      12/15/2016 -  Chemotherapy    Due to cancer progression, changed Chemo to Lonsurf Lonsurf 20-8.19 3 tab twice daily, on day 1-5 and 8-12, every 28 days, started on 12/15/2016      01/16/2017 Imaging    CT CAP with contrast 1. Enlarging hepatic and pulmonary metastatic lesions. Several small new metastatic lesions to the liver. 2. Enlarging infiltrating mesenteric mass in the left upper quadrant is causing partial obstruction of the duodenum, with resulting dilatation of the stomach and duodenum extending up to this point. This infiltrating mesenteric mass also partially encases the superior mesenteric artery. 3. Mild infrahilar and retroperitoneal adenopathy. 4.  Aortic Atherosclerosis (ICD10-I70.0). 5. Lumbar spondylosis and degenerative disc  disease potentially causing impingement in the lower lumbar spine. 6. Cholelithiasis. 7. Mild prostatomegaly.      HISTORY OF PRESENTING ILLNESS: 12/12/15 Tyler Evans 71 y.o. male is here because of a recent diagnosis of small bowel cancer. He presented to his PCP on 08/17/2014 with complaints of feeling weak and a one month history of a cough. He was found to be anemic and was referred to the emergency department. Hemoglobin was found to be 7.6, MCV 76.6; creatinine was elevated at 1.74. Stool was Hemoccult positive.  CT abdomen/pelvis without contrast on 08/17/2014 showed multiple large masses within the liver, bulky in appearance. The largest measured approximately 5.7 cm. There appeared to be a focal filling defect within the proximal jejunum measuring approximately 2.5 x 1.7 cm. There was mild adjacent jejunal wall thickening. The lung bases were clear.  On 08/18/2014 he underwent a small bowel enteroscopy with biopsy by Dr. Benson Norway. The esophagus and gastric lumen were normal. At the proximal jejunum there was evidence of abnormal mucosa and friability. The pediatric colonoscope was not able to traverse the area. Biopsies were obtained. An ultraslim colonoscope was then utilized. This colonoscope was able to traverse the area of  stenosis which measured approximately 3 cm in length. 50% of the lumen was ulcerated. 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 MMR stains are pending.  He was discharged home on 08/19/2014.  CURRENT THERAPY: Lonsurf 20-8.19 3 tab twice daily, on day 1-5 and 8-12, every 28 days, started on 12/15/2016, lowered to 2 tablets twice daily on 01/07/17 due to low WBC, changed to 3 tab in am and 2 tab in pm from cycle 3   INTERIM HISTORY:  Tyler Evans returns for follow-up. He received a blood transfusion on 02/04/17. He reports the first couple days after the blood transfusion he did not feel better but, he has felt better the past  couple of days. He didn't have any issue with blood transfusion. He takes two treatment pills in the morning and two in the evening. He feels better when he eats soft foods. Solid foods make him feel bloated and cause abdominal cramps. He takes a stool softener and has a bowel movement every day to every other day. If he drinks something really cold or breathes in cold air then he coughs.     This is a white MEDICAL HISTORY:  Past Medical History:  Diagnosis Date  . Diabetes mellitus without complication ([place])   . Hypertension   . Small bowel cancer ([place]) 08/18/2014    SURGICAL HISTORY: Past Surgical History:  Procedure Laterality Date  . ENTEROSCOPY N/A 08/18/2014   Procedure: ENTEROSCOPY;  Surgeon: Carol Ada, MD;  Location: [facility];  Service: Endoscopy;  Laterality: N/A;    SOCIAL HISTORY: Social History   Social History  . Marital status: Married    Spouse name: N/A  . Number of children: N/A  . Years of education: N/A   Social History Main Topics  . Smoking status: Never Smoker  . Smokeless tobacco: Never Used  . Alcohol use No  . Drug use: Unknown  . Sexual activity: Not on file   Other Topics Concern  . Not on file   Social History Narrative   Married, wife     FAMILY HISTORY: Family History  Problem Relation Age of Onset  . Heart failure Mother   . Cancer Father   . Cancer Brother     ALLERGIES:  is allergic to penicillins.  MEDICATIONS:  Current Outpatient Prescriptions on File Prior to Visit  Medication Sig Dispense Refill  . diphenoxylate-atropine (LOMOTIL) 2.5-0.025 MG tablet Take 2 tablets by mouth 4 (four) times daily as needed for diarrhea or loose stools. 60 tablet 0  . FREESTYLE LITE test strip AS DIRECTED ONCE A DAY TO CHECK BLOOD SUGARS IN VITRO 30 DAYS  12  . gabapentin (NEURONTIN) 100 MG capsule TAKE 2 CAPSULES BY MOUTH 3 TIMES DAILY 180 capsule 2  . LANTUS SOLOSTAR 100 UNIT/ML Solostar Pen Inject 20 Units into the skin every  evening.     . lidocaine-prilocaine (EMLA) cream Apply topically as needed. Apply to portacath 1 1/2 hours - 2 hours prior to procedures as needed. (Patient taking differently: Apply 1 application topically daily as needed (port access). Apply to portacath 1 1/2 hours - 2 hours prior to procedures) 30 g 2  . loperamide (IMODIUM) 2 MG capsule Take 4 mg by mouth 4 (four) times daily as needed for diarrhea or loose stools.    . ondansetron (ZOFRAN) 8 MG tablet Take 1 tablet (8 mg total) by mouth every 8 (eight) hours as needed for nausea or vomiting. 20 tablet 0  . potassium chloride  SA (KLOR-CON M20) 20 MEQ tablet Take 1 tablet (20 mEq total) by mouth daily. (Patient not taking: Reported on 02/03/2017) 30 tablet 1  . trifluridine-tipiracil (LONSURF) 20-8.19 MG tablet Take 2 tablets ('40mg'$  trifluridine) by mouth 2 times daily, 1 hr after AM & PM meals on days 1-5, 8-12. Repeat every 28days (Patient not taking: Reported on 02/12/2017) 40 tablet 2   Current Facility-Administered Medications on File Prior to Visit  Medication Dose Route Frequency Provider Last Rate Last Dose  . 0.9 %  sodium chloride infusion  250 mL Intravenous Once Truitt Merle, MD      . acetaminophen (TYLENOL) tablet 650 mg  650 mg Oral Once Truitt Merle, MD      . diphenhydrAMINE (BENADRYL) capsule 25 mg  25 mg Oral Once Truitt Merle, MD      ;  REVIEW OF SYSTEMS:   Constitutional: Denies fevers, chills or abnormal night sweats;  Eyes: Denies blurriness of vision, double vision Ears, nose, mouth, throat, and face: Denies mucositis or sore throat Respiratory: One month history of a nonproductive cough;  no shortness of breath Cardiovascular: Denies palpitation, chest discomfort or lower extremity swelling Gastrointestinal:  Denies heartburn. No dysphagia. Denies nausea, vomiting, abdominal pain (+) abdominal pain associated with eating solid foods Skin: Denies abnormal skin rashes, (+) dry skin, hyperpigmentation on palms Lymphatics: Denies  new lymphadenopathy or easy bruising Neurological: Denies extremity weakness  Behavioral/Psych: Mood is stable, no new changes  All other systems were reviewed with the patient and are negative.  PHYSICAL EXAMINATION:  ECOG PERFORMANCE STATUS: 2-3  Vitals:   02/12/17 0846  BP: (!) 121/48  Pulse: 86  Resp: 18  Temp: 97.9 F (36.6 C)  SpO2: 100%   Filed Weights   02/12/17 0846  Weight: 163 lb 14.4 oz (74.3 kg)     GENERAL:alert, no distress and comfortable SKIN: skin color, texture, turgor are normal, (+) skin pigmentation On his face, and hands, from her recent Xeloda. (+) Diffuse mild skin erythema on his palms and bottom of feet. No skin peeling or crackers on palms or bottoms of feet.   EYES: normal, conjunctiva are pink and non-injected, sclera clear OROPHARYNX:no exudate, no erythema and lips, buccal mucosa, and tongue normal  NECK: supple, thyroid normal size, non-tender, without nodularity LYMPH:  no palpable lymphadenopathy in the cervical, axillary or inguinal regions LUNGS: clear to auscultation and percussion with normal breathing effort HEART: regular rate & rhythm and no murmurs  ABDOMEN:abdomen soft, mild tenderness at the right upper quadrant , no rebound pain  MSK:no cyanosis of digits and no clubbing  PSYCH: alert & oriented x 3 with fluent speech NEURO: no focal motor deficits, his light touch sensation and vibration sensation are diminished on his hands and feet.   LABORATORY DATA:  I have reviewed the data as listed CBC Latest Ref Rng & Units 02/12/2017 02/03/2017 01/19/2017  WBC 4.0 - 10.3 10e3/uL 4.3 6.9 10.1  Hemoglobin 13.0 - 17.1 g/dL 10.2(L) 8.3(L) 9.2(L)  Hematocrit 38.4 - 49.9 % 31.4(L) 25.4(L) 29.6(L)  Platelets 140 - 400 10e3/uL 140 148 159    CMP Latest Ref Rng & Units 02/12/2017 02/03/2017 01/19/2017  Glucose 70 - 140 mg/dl 80 121 138  BUN 7.0 - 26.0 mg/dL 15.6 14.2 11.3  Creatinine 0.7 - 1.3 mg/dL 0.9 0.9 0.9  Sodium 136 - 145 mEq/L 138 138 139    Potassium 3.5 - 5.1 mEq/L 3.6 4.0 4.0  Chloride 101 - 111 mmol/L - - -  CO2  22 - 29 mEq/L '26 28 29  '$ Calcium 8.4 - 10.4 mg/dL 8.9 9.1 8.9  Total Protein 6.4 - 8.3 g/dL 6.8 6.6 6.5  Total Bilirubin 0.20 - 1.20 mg/dL 1.36(H) 0.99 0.70  Alkaline Phos 40 - 150 U/L 192(H) 144 158(H)  AST 5 - 34 U/L 56(H) 47(H) 41(H)  ALT 0 - 55 U/L '13 12 13    '$ CEA: 08/31/2014: 189 12/12/2014: 9.0 03/27/2015: 5.0 05/29/2015: 17.6 07/09/2015: 41 11/26/2015: 153 01/30/2016: 523 02/29/2016: 985 03/31/2016: 295 04/29/2016: 244.69 05/27/2016: 347 06/24/2016: 427 07/15/2016: 709.32 08/27/2016: 862 09/30/16: 1424.10 10/29/16: 1884.61 12/02/2016: 2334.31 12/26/16: 2590.50 01/19/2017: 3131.48  Pathology report:  Small Intestine Biopsy, jejunal mass 08/18/2014 - INVASIVE ADENOCARCINOMA IN A BACKGROUND OF TUBULOVILLOUS ADENOMA. ADDITIONAL INFORMATION: Mismatch Repair (MMR) Protein Immunohistochemistry (IHC) IHC Expression Result (LIMITED TUMOR): MLH1: Preserved nuclear expression (greater 50% tumor expression) MSH2: Preserved nuclear expression (greater 50% tumor expression) MSH6: Preserved nuclear expression (greater 50% tumor expression) PMS2: Preserved nuclear expression (greater 50% tumor expression) * Internal control demonstrates intact nuclear expression Interpretation: NORMAL There is preserved expression  Diagnosis 08/28/2014  Liver, needle/core biopsy, right lobe - METASTATIC ADENOCARCINOMA       RADIOGRAPHIC STUDIES: I have personally reviewed the radiological images as listed and agreed with the findings in the report.  CT chest abdomen pelvis with contrast 01/16/17 IMPRESSION: 1. Enlarging hepatic and pulmonary metastatic lesions. Several small new metastatic lesions to the liver. 2. Enlarging infiltrating mesenteric mass in the left upper quadrant is causing partial obstruction of the duodenum, with resulting dilatation of the stomach and duodenum extending up to this point. This  infiltrating mesenteric mass also partially encases the superior mesenteric artery. 3. Mild infrahilar and retroperitoneal adenopathy. 4.  Aortic Atherosclerosis (ICD10-I70.0). 5. Lumbar spondylosis and degenerative disc disease potentially causing impingement in the lower lumbar spine. 6. Cholelithiasis. 7. Mild prostatomegaly.  CT Abdomen Pelvis w contrast 12/02/2016  IMPRESSION: 1. Enlarging hepatic and pulmonary metastatic lesions. Several small new metastatic lesions to the liver. 2. Enlarging infiltrating mesenteric mass in the left upper quadrant is causing partial obstruction of the duodenum, with resulting dilatation of the stomach and duodenum extending up to this point. This infiltrating mesenteric mass also partially encases the superior mesenteric artery. 3. Mild infrahilar and retroperitoneal adenopathy. 4.  Aortic Atherosclerosis (ICD10-I70.0). 5. Lumbar spondylosis and degenerative disc disease potentially causing impingement in the lower lumbar spine. 6. Cholelithiasis. 7. Mild prostatomegaly.  CT chest abdomen pelvis with contrast 08/26/2016 IMPRESSION: 1. Similar pulmonary metastasis. 2. Similar to slight improvement in hepatic metastasis. 3. Enlargement of a gastrohepatic ligament node which is suspicious. Possible adenopathy at the root of the small bowel mesenteriy. Recommend attention on follow-up. 4.  Coronary artery atherosclerosis. Aortic atherosclerosis. 5. Gynecomastia. 6. Prostatomegaly. 7. Cholelithiasis. 8. Pulmonary artery enlargement suggests pulmonary arterial hypertension.  CT CAP w/ Contrast 06/17/16 IMPRESSION: Mild increase in size of several hepatic metastases. Stable sub-cm bilateral pulmonary metastases. Interval resolution of diffuse colitis since prior exam. Incidentally noted aortic atherosclerosis, cholelithiasis, and mildly enlarged prostate.   ASSESSMENT & PLAN:  71 y.o. gentleman with past medical history of diabetes,  hypertension, who presents with anemia and weakness.  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  -Unfortunately he has stage IV disease with diffuse liver metastasis, this is an incurable disease, with overall very poor prognosis. -I previously reviewed his staging CT scan results  from 11/26/2015 with patient and his wife, Unfortunately scan showed disease progression in the liver, his tumor marker CA has been trending up lately, which is consistent with disease progression. -I previously discussed his [facility] testing results, which showed mutations in [place], RAF 1, APC, TP 57,  Targeted therapy is nt available at this point. -I previously discussed and reviewed his restaging CT images from 01/28/2016 which showed disease progression in liver and lungs. I recommend him to stop gemcitabine -His tumor marker CEA has previously been trending up, corresponding to his disease progression -I have checked the clinical trial options at [facility] and [place], unfortunately there is no suitable trials for him now.  -He previously had multiple line chemo, currently on Xeloda with reduced dose to 1500 mg bid, 1 weeks on, one-week off. tolerating well --We previously Reviewed staging CT scan from 08/26/2016, which showed improved liver metastasis, stable lung nodules. No other new lesions. -I previously reviewed his staging CT scan from 12/29/2016, which unfortunately showed significant disease progression in the liver and primary site. He has developed partial small bowel resection from the small bowel tumor, symptomatic with constipation and nausea.  -his small bowel obstruction symptoms resolved with low residual diet -Surgical options for small bowel was discussed with patient by Dr. Barry Dienes. Pt prefers not to have surgery if symptoms  manageable  -he has recently started [facility] 12/15/16, tolerating well, except cytopenias, dose reduced for cycle 2   -He had no significant neutropenia after cycle 2, I recommended him to increase Lonsurf to 3 tab in am and 2 tab in pm for cycle 3 -Labs reviewed. HG 10.2 today, improved since blood transfusion. Total bilirubin slightly elevated at 1.36. -Will continue to hold on surgery.  -Repeat scan after next cycle, in 3-4 weeks. -F/u in 3 weeks  2. Small bowel obstruction -Secondary to his primary proximal small bowel tumor -He is on low residual diet, tolerating well, normal nausea or vomiting. His bowel movement is normal. -Follow-up of his dietitian -I previously discussed with surgeons about possibility of small bowel surgery versus J-tube placement for nutrition.  -Patient prefers to hold on surgery for now -Palliative radiation is also an option if he has worsening bowel obstruction symptoms   -Will continue to monitor.    3. 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 He is slightly anemic today, no need for blood transfusion.  -Reviewed labs, advised him that his HbG is 7.5, 12/27/15, and he needs a blood transfusion. Discussed that this is likely the source of his mild fatigue and that he will need to have transfusion next week. We also discussed that his plantlet are low at 7.8 previously. -Blood transfusion 01/01/17 -HG now improved since blood  transfusion.  -HG is 10.2 today  4.  Hypertension and DM  -He will continue follow-up with his primary care physician. -He has been off lisinopril lately due to borderline low blood pressure, his blood pressure is not elevated today, he will continue monitoring at home, and restart lisinopril if  needed -I suggest he does not need to fast before visits, he should keep up with his diet.  -continue close monitoring   5. Peripheral neuropathy, hand-foot syndrome, secondary to chemotherapy -His peripheral neuropathy, worse after Xeloda, especially with skin reactions, much improving now. -Continue Neurontin  200 mg 3 times a day and now off Xeloda -We'll continue observation.   6. Goal of care discussion  -We again had discussed the incurable nature of his cancer, and the overall poor prognosis, especially now he has progressed through multiple lines of chemotherapy and his treatment option is very limited. -The patient understands the goal of care is palliative. -I recommend DNR/DNI, he agrees.   7. Abdominal pain associated with eating -Describes pain associated with eating solid foods until having bowel movement, likely related to his small biopsy option -Takes a stool softener. Has bowel movement every day to every other day. Encouraged the patient to take medication to avoid constipation. -Given Tramadol 50 mg prescription today for pain management.  Plan: -CT scan the week of October 8th.  -Given Tramadol 50 mg prescription today for pain management. -F/u in 3 weeks  I spent 25 minutes before his visit, more than 50% on face-to-face counseling.  I have reviewed the above documentation for accuracy and completeness, and I agree with the above information.      This document serves as a record of services personally performed by Truitt Merle, MD. It was created on her behalf by Arlyce Harman, a trained medical scribe. The creation of this record is based on the scribe's personal  observations and the provider's statements to them. This document has been checked and approved by the attending provider.   Truitt Merle  02/12/2017

## 2017-02-12 ENCOUNTER — Other Ambulatory Visit (HOSPITAL_BASED_OUTPATIENT_CLINIC_OR_DEPARTMENT_OTHER): Payer: Medicare Other

## 2017-02-12 ENCOUNTER — Telehealth: Payer: Self-pay

## 2017-02-12 ENCOUNTER — Ambulatory Visit (HOSPITAL_BASED_OUTPATIENT_CLINIC_OR_DEPARTMENT_OTHER): Payer: Medicare Other | Admitting: Hematology

## 2017-02-12 VITALS — BP 121/48 | HR 86 | Temp 97.9°F | Resp 18 | Ht 66.0 in | Wt 163.9 lb

## 2017-02-12 DIAGNOSIS — C772 Secondary and unspecified malignant neoplasm of intra-abdominal lymph nodes: Secondary | ICD-10-CM

## 2017-02-12 DIAGNOSIS — D500 Iron deficiency anemia secondary to blood loss (chronic): Secondary | ICD-10-CM | POA: Diagnosis not present

## 2017-02-12 DIAGNOSIS — C179 Malignant neoplasm of small intestine, unspecified: Secondary | ICD-10-CM

## 2017-02-12 DIAGNOSIS — L271 Localized skin eruption due to drugs and medicaments taken internally: Secondary | ICD-10-CM | POA: Diagnosis not present

## 2017-02-12 DIAGNOSIS — K56609 Unspecified intestinal obstruction, unspecified as to partial versus complete obstruction: Secondary | ICD-10-CM

## 2017-02-12 DIAGNOSIS — C787 Secondary malignant neoplasm of liver and intrahepatic bile duct: Secondary | ICD-10-CM

## 2017-02-12 DIAGNOSIS — E119 Type 2 diabetes mellitus without complications: Secondary | ICD-10-CM

## 2017-02-12 DIAGNOSIS — D630 Anemia in neoplastic disease: Secondary | ICD-10-CM

## 2017-02-12 DIAGNOSIS — G620 Drug-induced polyneuropathy: Secondary | ICD-10-CM | POA: Diagnosis not present

## 2017-02-12 DIAGNOSIS — T451X5A Adverse effect of antineoplastic and immunosuppressive drugs, initial encounter: Secondary | ICD-10-CM

## 2017-02-12 DIAGNOSIS — R109 Unspecified abdominal pain: Secondary | ICD-10-CM

## 2017-02-12 LAB — COMPREHENSIVE METABOLIC PANEL
ALBUMIN: 2.4 g/dL — AB (ref 3.5–5.0)
ALK PHOS: 192 U/L — AB (ref 40–150)
ALT: 13 U/L (ref 0–55)
ANION GAP: 11 meq/L (ref 3–11)
AST: 56 U/L — ABNORMAL HIGH (ref 5–34)
BILIRUBIN TOTAL: 1.36 mg/dL — AB (ref 0.20–1.20)
BUN: 15.6 mg/dL (ref 7.0–26.0)
CALCIUM: 8.9 mg/dL (ref 8.4–10.4)
CO2: 26 mEq/L (ref 22–29)
Chloride: 100 mEq/L (ref 98–109)
Creatinine: 0.9 mg/dL (ref 0.7–1.3)
EGFR: 86 mL/min/{1.73_m2} — AB (ref >90)
Glucose: 80 mg/dl (ref 70–140)
Potassium: 3.6 mEq/L (ref 3.5–5.1)
Sodium: 138 mEq/L (ref 136–145)
TOTAL PROTEIN: 6.8 g/dL (ref 6.4–8.3)

## 2017-02-12 LAB — CBC WITH DIFFERENTIAL/PLATELET
BASO%: 0.3 % (ref 0.0–2.0)
BASOS ABS: 0 10*3/uL (ref 0.0–0.1)
EOS ABS: 0 10*3/uL (ref 0.0–0.5)
EOS%: 0.7 % (ref 0.0–7.0)
HEMATOCRIT: 31.4 % — AB (ref 38.4–49.9)
HGB: 10.2 g/dL — ABNORMAL LOW (ref 13.0–17.1)
LYMPH#: 0.7 10*3/uL — AB (ref 0.9–3.3)
LYMPH%: 17.4 % (ref 14.0–49.0)
MCH: 29 pg (ref 27.2–33.4)
MCHC: 32.7 g/dL (ref 32.0–36.0)
MCV: 88.7 fL (ref 79.3–98.0)
MONO#: 0.6 10*3/uL (ref 0.1–0.9)
MONO%: 14.9 % — ABNORMAL HIGH (ref 0.0–14.0)
NEUT%: 66.7 % (ref 39.0–75.0)
NEUTROS ABS: 2.9 10*3/uL (ref 1.5–6.5)
PLATELETS: 140 10*3/uL (ref 140–400)
RBC: 3.54 10*6/uL — ABNORMAL LOW (ref 4.20–5.82)
RDW: 19.3 % — AB (ref 11.0–14.6)
WBC: 4.3 10*3/uL (ref 4.0–10.3)

## 2017-02-12 MED ORDER — TRAMADOL HCL 50 MG PO TABS: 50.0000 mg | ORAL_TABLET | Freq: Four times a day (QID) | ORAL | 0 refills | Status: DC | PRN

## 2017-02-12 NOTE — Telephone Encounter (Signed)
Printed avs and calender for October per 9/13 los.

## 2017-02-13 ENCOUNTER — Encounter: Payer: Self-pay | Admitting: Hematology

## 2017-02-19 ENCOUNTER — Telehealth: Payer: Self-pay | Admitting: *Deleted

## 2017-02-19 NOTE — Telephone Encounter (Signed)
Received call from pt stating that he is having some swelling in his feet & ankles & noticed yesterday. He reports that he increased his lonesurf on Monday to 2 in am & 3 in pm.  He states that both feet/ankles are swollen equally.  He states that he is trying to elevate.  Discussed with Dr Burr Medico & she wants him to elevate feet as much as possible above his heart & try compression stockings.  He is to cont his lonesurf   Discussed with pt & informed to let us know if swelling is worse.  Pt expressed understanding. Per Dr Burr Medico this may be more related to protein level.

## 2017-03-02 ENCOUNTER — Ambulatory Visit: Payer: Medicare Other

## 2017-03-02 ENCOUNTER — Ambulatory Visit (HOSPITAL_BASED_OUTPATIENT_CLINIC_OR_DEPARTMENT_OTHER): Payer: Medicare Other

## 2017-03-02 ENCOUNTER — Other Ambulatory Visit (HOSPITAL_BASED_OUTPATIENT_CLINIC_OR_DEPARTMENT_OTHER): Payer: Medicare Other

## 2017-03-02 ENCOUNTER — Ambulatory Visit (HOSPITAL_COMMUNITY)
Admission: RE | Admit: 2017-03-02 | Discharge: 2017-03-02 | Disposition: A | Discharge status: Home / Self Care | Payer: Medicare Other | Source: Ambulatory Visit | Attending: Hematology | Admitting: Hematology

## 2017-03-02 ENCOUNTER — Other Ambulatory Visit: Payer: Self-pay | Admitting: *Deleted

## 2017-03-02 DIAGNOSIS — D638 Anemia in other chronic diseases classified elsewhere: Secondary | ICD-10-CM

## 2017-03-02 DIAGNOSIS — C787 Secondary malignant neoplasm of liver and intrahepatic bile duct: Secondary | ICD-10-CM

## 2017-03-02 DIAGNOSIS — D630 Anemia in neoplastic disease: Secondary | ICD-10-CM | POA: Diagnosis present

## 2017-03-02 DIAGNOSIS — C179 Malignant neoplasm of small intestine, unspecified: Secondary | ICD-10-CM | POA: Diagnosis not present

## 2017-03-02 DIAGNOSIS — C772 Secondary and unspecified malignant neoplasm of intra-abdominal lymph nodes: Secondary | ICD-10-CM | POA: Diagnosis not present

## 2017-03-02 DIAGNOSIS — C801 Malignant (primary) neoplasm, unspecified: Secondary | ICD-10-CM | POA: Insufficient documentation

## 2017-03-02 DIAGNOSIS — D6481 Anemia due to antineoplastic chemotherapy: Secondary | ICD-10-CM

## 2017-03-02 DIAGNOSIS — Z95828 Presence of other vascular implants and grafts: Secondary | ICD-10-CM

## 2017-03-02 LAB — COMPREHENSIVE METABOLIC PANEL
ALT: 12 U/L (ref 0–55)
ANION GAP: 11 meq/L (ref 3–11)
AST: 45 U/L — AB (ref 5–34)
Albumin: 2.3 g/dL — ABNORMAL LOW (ref 3.5–5.0)
Alkaline Phosphatase: 128 U/L (ref 40–150)
BUN: 25.3 mg/dL (ref 7.0–26.0)
CHLORIDE: 98 meq/L (ref 98–109)
CO2: 25 meq/L (ref 22–29)
CREATININE: 0.9 mg/dL (ref 0.7–1.3)
Calcium: 8.8 mg/dL (ref 8.4–10.4)
EGFR: 86 mL/min/{1.73_m2} — ABNORMAL LOW (ref >90)
Glucose: 135 mg/dl (ref 70–140)
POTASSIUM: 4.1 meq/L (ref 3.5–5.1)
Sodium: 135 mEq/L — ABNORMAL LOW (ref 136–145)
Total Bilirubin: 1.63 mg/dL — ABNORMAL HIGH (ref 0.20–1.20)
Total Protein: 6.4 g/dL (ref 6.4–8.3)

## 2017-03-02 LAB — PREPARE RBC (CROSSMATCH)

## 2017-03-02 LAB — CBC WITH DIFFERENTIAL/PLATELET
BASO%: 0 % (ref 0.0–2.0)
Basophils Absolute: 0 10*3/uL (ref 0.0–0.1)
EOS%: 0.2 % (ref 0.0–7.0)
Eosinophils Absolute: 0 10*3/uL (ref 0.0–0.5)
HCT: 24 % — ABNORMAL LOW (ref 38.4–49.9)
HGB: 7.6 g/dL — ABNORMAL LOW (ref 13.0–17.1)
LYMPH%: 13.2 % — AB (ref 14.0–49.0)
MCH: 28.1 pg (ref 27.2–33.4)
MCHC: 31.7 g/dL — AB (ref 32.0–36.0)
MCV: 88.9 fL (ref 79.3–98.0)
MONO#: 0.1 10*3/uL (ref 0.1–0.9)
MONO%: 2.3 % (ref 0.0–14.0)
NEUT%: 84.3 % — AB (ref 39.0–75.0)
NEUTROS ABS: 4.7 10*3/uL (ref 1.5–6.5)
Platelets: 48 10*3/uL — ABNORMAL LOW (ref 140–400)
RBC: 2.7 10*6/uL — AB (ref 4.20–5.82)
RDW: 19.2 % — ABNORMAL HIGH (ref 11.0–14.6)
WBC: 5.6 10*3/uL (ref 4.0–10.3)
lymph#: 0.7 10*3/uL — ABNORMAL LOW (ref 0.9–3.3)
nRBC: 0 % (ref 0–0)

## 2017-03-02 LAB — CEA (IN HOUSE-CHCC)

## 2017-03-02 MED ORDER — SODIUM CHLORIDE 0.9 % IV SOLN
250.0000 mL | Freq: Once | INTRAVENOUS | Status: AC
  Administered 2017-03-02: 250 mL via INTRAVENOUS

## 2017-03-02 MED ORDER — SODIUM CHLORIDE 0.9 % IJ SOLN
10.0000 mL | INTRAMUSCULAR | Status: DC | PRN
  Administered 2017-03-02: 10 mL via INTRAVENOUS
  Filled 2017-03-02: qty 10

## 2017-03-02 MED ORDER — HEPARIN SOD (PORK) LOCK FLUSH 100 UNIT/ML IV SOLN
500.0000 [IU] | Freq: Once | INTRAVENOUS | Status: AC | PRN
  Administered 2017-03-02: 500 [IU] via INTRAVENOUS
  Filled 2017-03-02: qty 5

## 2017-03-02 NOTE — Patient Instructions (Signed)

## 2017-03-03 LAB — TYPE AND SCREEN
ABO/RH(D): A POS
Antibody Screen: NEGATIVE
UNIT DIVISION: 0
Unit division: 0

## 2017-03-03 LAB — BPAM RBC
BLOOD PRODUCT EXPIRATION DATE: 201810062359
Blood Product Expiration Date: 201810062359
ISSUE DATE / TIME: 201810011239
ISSUE DATE / TIME: 201810011239
UNIT TYPE AND RH: 600
Unit Type and Rh: 600

## 2017-03-04 ENCOUNTER — Other Ambulatory Visit: Payer: Self-pay | Admitting: Hematology

## 2017-03-04 DIAGNOSIS — T451X5A Adverse effect of antineoplastic and immunosuppressive drugs, initial encounter: Principal | ICD-10-CM

## 2017-03-04 DIAGNOSIS — G620 Drug-induced polyneuropathy: Secondary | ICD-10-CM

## 2017-03-04 DIAGNOSIS — C179 Malignant neoplasm of small intestine, unspecified: Secondary | ICD-10-CM

## 2017-03-09 ENCOUNTER — Ambulatory Visit (HOSPITAL_COMMUNITY)
Admission: RE | Admit: 2017-03-09 | Discharge: 2017-03-09 | Disposition: A | Discharge status: Home / Self Care | Payer: Medicare Other | Source: Ambulatory Visit | Attending: Hematology | Admitting: Hematology

## 2017-03-09 ENCOUNTER — Encounter (HOSPITAL_COMMUNITY): Payer: Self-pay

## 2017-03-09 ENCOUNTER — Ambulatory Visit: Payer: Medicare Other

## 2017-03-09 ENCOUNTER — Other Ambulatory Visit (HOSPITAL_BASED_OUTPATIENT_CLINIC_OR_DEPARTMENT_OTHER): Payer: Medicare Other

## 2017-03-09 DIAGNOSIS — I358 Other nonrheumatic aortic valve disorders: Secondary | ICD-10-CM | POA: Diagnosis not present

## 2017-03-09 DIAGNOSIS — C772 Secondary and unspecified malignant neoplasm of intra-abdominal lymph nodes: Secondary | ICD-10-CM | POA: Diagnosis not present

## 2017-03-09 DIAGNOSIS — I2510 Atherosclerotic heart disease of native coronary artery without angina pectoris: Secondary | ICD-10-CM | POA: Insufficient documentation

## 2017-03-09 DIAGNOSIS — C179 Malignant neoplasm of small intestine, unspecified: Secondary | ICD-10-CM | POA: Diagnosis not present

## 2017-03-09 DIAGNOSIS — C787 Secondary malignant neoplasm of liver and intrahepatic bile duct: Secondary | ICD-10-CM | POA: Diagnosis not present

## 2017-03-09 DIAGNOSIS — C7800 Secondary malignant neoplasm of unspecified lung: Secondary | ICD-10-CM | POA: Diagnosis not present

## 2017-03-09 DIAGNOSIS — I700 Atherosclerosis of aorta: Secondary | ICD-10-CM | POA: Insufficient documentation

## 2017-03-09 DIAGNOSIS — Z95828 Presence of other vascular implants and grafts: Secondary | ICD-10-CM

## 2017-03-09 LAB — COMPREHENSIVE METABOLIC PANEL
ALBUMIN: 2.2 g/dL — AB (ref 3.5–5.0)
ALK PHOS: 144 U/L (ref 40–150)
ALT: 15 U/L (ref 0–55)
AST: 53 U/L — AB (ref 5–34)
Anion Gap: 15 mEq/L — ABNORMAL HIGH (ref 3–11)
BUN: 29.1 mg/dL — AB (ref 7.0–26.0)
CALCIUM: 8.9 mg/dL (ref 8.4–10.4)
CO2: 23 mEq/L (ref 22–29)
CREATININE: 0.9 mg/dL (ref 0.7–1.3)
Chloride: 99 mEq/L (ref 98–109)
EGFR: 85 mL/min/{1.73_m2} — ABNORMAL LOW (ref >90)
GLUCOSE: 90 mg/dL (ref 70–140)
POTASSIUM: 4.3 meq/L (ref 3.5–5.1)
SODIUM: 137 meq/L (ref 136–145)
Total Bilirubin: 2.17 mg/dL — ABNORMAL HIGH (ref 0.20–1.20)
Total Protein: 6.8 g/dL (ref 6.4–8.3)

## 2017-03-09 LAB — CBC WITH DIFFERENTIAL/PLATELET
BASO%: 0 % (ref 0.0–2.0)
BASOS ABS: 0 10*3/uL (ref 0.0–0.1)
EOS%: 0.8 % (ref 0.0–7.0)
Eosinophils Absolute: 0 10*3/uL (ref 0.0–0.5)
HEMATOCRIT: 29.6 % — AB (ref 38.4–49.9)
HEMOGLOBIN: 9.5 g/dL — AB (ref 13.0–17.1)
LYMPH#: 0.6 10*3/uL — AB (ref 0.9–3.3)
LYMPH%: 26 % (ref 14.0–49.0)
MCH: 29 pg (ref 27.2–33.4)
MCHC: 32.1 g/dL (ref 32.0–36.0)
MCV: 90.2 fL (ref 79.3–98.0)
MONO#: 0.7 10*3/uL (ref 0.1–0.9)
MONO%: 27.3 % — AB (ref 0.0–14.0)
NEUT%: 45.9 % (ref 39.0–75.0)
NEUTROS ABS: 1.1 10*3/uL — AB (ref 1.5–6.5)
Platelets: 104 10*3/uL — ABNORMAL LOW (ref 140–400)
RBC: 3.28 10*6/uL — ABNORMAL LOW (ref 4.20–5.82)
RDW: 18.8 % — AB (ref 11.0–14.6)
WBC: 2.4 10*3/uL — AB (ref 4.0–10.3)

## 2017-03-09 MED ORDER — IOPAMIDOL (ISOVUE-300) INJECTION 61%
INTRAVENOUS | Status: AC
  Filled 2017-03-09: qty 100

## 2017-03-09 MED ORDER — SODIUM CHLORIDE 0.9 % IJ SOLN
10.0000 mL | INTRAMUSCULAR | Status: DC | PRN
  Administered 2017-03-09: 10 mL via INTRAVENOUS
  Filled 2017-03-09: qty 10

## 2017-03-09 MED ORDER — IOPAMIDOL (ISOVUE-300) INJECTION 61%
100.0000 mL | Freq: Once | INTRAVENOUS | Status: AC | PRN
  Administered 2017-03-09: 100 mL via INTRAVENOUS

## 2017-03-09 MED ORDER — HEPARIN SOD (PORK) LOCK FLUSH 100 UNIT/ML IV SOLN
INTRAVENOUS | Status: AC
  Filled 2017-03-09: qty 5

## 2017-03-09 NOTE — Patient Instructions (Signed)

## 2017-03-10 NOTE — Progress Notes (Signed)
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       08/18/2014 Miscellaneous    tumor KRAS mutation (-)      08/18/2014 Pathology Results    Small intestine biopsy showed metastatic adenocarcinoma, consistent with GI primary      08/18/2014 Initial Diagnosis    Small bowel cancer      08/18/2014 Procedure    small bowel enteroscopy with biopsy by Dr. Benson Norway showed at the proximal jejunum there was evidence of abnormal mucosa and friability, biopsy was obtained.       08/28/2014 Pathology Results    Diagnosis Liver, needle/core biopsy, right lobe - METASTATIC ADENOCARCINOMA        09/05/2014 Imaging    PET hypermetabolic mass involving a small bowel loop with adjacent mesenteric lymphadenopathy, and diffuse liver metastasis and mild hypermetabolic lymphadenopathy in porta hepatis.      09/06/2014 - 02/01/2015 Chemotherapy    mFOLFOX, stopped due to his neuropathy, and earlier mild disease progression on PET       09/07/2014 - 09/11/2014 Hospital Admission    He was admitted for fever and GI bleeding. Received 3 units of RBC.      10/26/2014 Imaging    Interval significant improvement in the primary small bowel mass, adjacent lymphadenopathy and extensive hepatic metastatic disease. No other new lesions.       02/09/2015 Imaging    PET/CT scan showed stable primary malignancy in the proximal jejunum, mild metabolic progression of the 3 residual metabolic liver  metastasis. CT  Portion showed decreased size of his liver metastasis.      02/27/2015 - 11/06/2015 Chemotherapy    second line chemo FOLFIRI, every 2 weeks, and panitumumab (held for second cycle due to severe skin rashes), chemotherapy stopped due to disease progression.      08/06/2015 Imaging    Stable number and size of the liver lesions. The left lower lobe nodule is considerably less prominent. No other new lesions.      11/26/2015 Progression    Restaging CT chest, abdomen and pelvis with contrast showed interval increase in size of liver lesions, multiple small pulmonary nodules are not significantly changed in the interval. Her tumor marker CEA also increased significantly      12/07/2015 - 01/16/2016 Chemotherapy    Gemcitabine 1000 mg/m2 (decreased to 800 mg/m from second dose), and a one and 8 every 21 days, stopped due to disease progression       02/12/2016 - 03/19/2016 Chemotherapy    Xeloda 1051m/m2 twice daily, 2 weeks on, one-week off, stopped after 2 cycles due to severe side effects (diarrhea, skin toxicities)      03/19/2016 - 03/21/2016 Hospital Admission    Patient was admitted for severe diarrhea, dehydration after second cycle of Xeloda, stoll c-diff was negative. He received IV fluids and supportive care.      06/17/2016 Imaging    CT Chest, Abdomen, Pelvis w/  Contrast  IMPRESSION: Mild increase in size of several hepatic metastases.  Stable sub-cm bilateral pulmonary metastases.  Interval resolution of diffuse colitis since prior exam.  Incidentally noted aortic atherosclerosis, cholelithiasis, and mildly enlarged prostate.      06/24/2016 - 12/04/2016 Chemotherapy    Xeloda 1528m bid 2 weeks on, 1 weeks off, changed to 1 week on and 1 week off from cycle 2 due to tolerance issue. Reduced to 5 days on and 9 days off due to sensitivity to Xeloda.       08/26/2016 Imaging     CT chest abdomen pelvis with contrast IMPRESSION: 1. Similar pulmonary  metastasis. 2. Similar to slight improvement in hepatic metastasis. 3. Enlargement of a gastrohepatic ligament node which is suspicious. Possible adenopathy at the root of the small bowel mesenteriy. Recommend attention on follow-up. 4.  Coronary artery atherosclerosis. Aortic atherosclerosis. 5. Gynecomastia. 6. Prostatomegaly. 7. Cholelithiasis. 8. Pulmonary artery enlargement suggests pulmonary arterial hypertension.       12/02/2016 Imaging    CT Abdomen Pelvis w contrast 12/02/2016  IMPRESSION: 1. Enlarging hepatic and pulmonary metastatic lesions. Several small new metastatic lesions to the liver. 2. Enlarging infiltrating mesenteric mass in the left upper quadrant is causing partial obstruction of the duodenum, with resulting dilatation of the stomach and duodenum extending up to this point. This infiltrating mesenteric mass also partially encases the superior mesenteric artery. 3. Mild infrahilar and retroperitoneal adenopathy. 4.  Aortic Atherosclerosis (ICD10-I70.0). 5. Lumbar spondylosis and degenerative disc disease potentially causing impingement in the lower lumbar spine. 6. Cholelithiasis. 7. Mild prostatomegaly.      12/15/2016 - 03/11/2017 Chemotherapy    Lonsurf 20-8.19 3 tab twice daily, on day 1-5 and 8-12, every 28 days, started on 12/15/2016, lowered to 2 tablets twice daily on 01/07/17 due to low WBC, changed to 3 tab in am and 2 tab in pm from cycle 3. Due to disease progression stopped on 03/11/17       01/16/2017 Imaging    CT CAP with contrast 1. Enlarging hepatic and pulmonary metastatic lesions. Several small new metastatic lesions to the liver. 2. Enlarging infiltrating mesenteric mass in the left upper quadrant is causing partial obstruction of the duodenum, with resulting dilatation of the stomach and duodenum extending up to this point. This infiltrating mesenteric mass also partially encases the superior mesenteric artery. 3. Mild infrahilar and  retroperitoneal adenopathy. 4.  Aortic Atherosclerosis (ICD10-I70.0). 5. Lumbar spondylosis and degenerative disc disease potentially causing impingement in the lower lumbar spine. 6. Cholelithiasis. 7. Mild prostatomegaly.      03/09/2017 Imaging    CT CAP W Contrast 03/09/17 IMPRESSION: 1. Today's study demonstrates progression of metastatic disease to the liver and lungs, as detailed above. In addition, there is a new large volume of what appears to be malignant ascites. 2. The primary neoplasm in the region of the jejunum and root of the small bowel mesenteries also appears slightly larger than the prior examination. 3. Aortic atherosclerosis, in addition to 2 vessel coronary artery disease. 4. There are calcifications of the aortic valve. Echocardiographic correlation for evaluation of potential valvular dysfunction may be warranted if clinically indicated. 5. Additional incidental findings, as above. Aortic Atherosclerosis (ICD10-I70.0).      HISTORY OF PRESENTING ILLNESS: 12/12/15 Tyler [id number].o. male is here because of a recent diagnosis of small bowel cancer. He presented to his PCP on 08/17/2014 with complaints of feeling weak and a one month history of a cough. He was found to  be anemic and was referred to the emergency department. Hemoglobin was found to be 7.6, MCV 76.6; creatinine was elevated at 1.74. Stool was Hemoccult positive.  CT abdomen/pelvis without contrast on 08/17/2014 showed multiple large masses within the liver, bulky in appearance. The largest measured approximately 5.7 cm. There appeared to be a focal filling defect within the proximal jejunum measuring approximately 2.5 x 1.7 cm. There was mild adjacent jejunal wall thickening. The lung bases were clear.  On 08/18/2014 he underwent a small bowel enteroscopy with biopsy by Dr. Benson Norway. The esophagus and gastric lumen were normal. At the proximal jejunum there was evidence of abnormal mucosa and  friability. The pediatric colonoscope was not able to traverse the area. Biopsies were obtained. An ultraslim colonoscope was then utilized. This colonoscope was able to traverse the area of stenosis which measured approximately 3 cm in length. 50% of the lumen was ulcerated. 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 MMR stains are pending.  He was discharged home on 08/19/2014.   CURRENT THERAPY: Supportive Care, starting hospice next week    INTERIM HISTORY:  Mr. Reppond returns for follow-up and to discuss his recent CT Scan. He presents to the clinic today accompanied by his wife.  He reports to losing weight but increase in bloating in his abdomen. He denies SOB when laying flat. He does have some abdominal pain but has tightness. He denies nausea and fever. He eats and his BM are about every other day.  He notes is neuropathy has not changed much. He was taking Gabapentin 6 capsules a d day. He is willing to increase dose. He is willing to do Hospice Care at home and understands how they will help him. He would like to undergo the paracentesis this week.     This is a white MEDICAL HISTORY:  Past Medical History:  Diagnosis Date  . Diabetes mellitus without complication ([place])   . Hypertension   . Small bowel cancer ([place]) 08/18/2014    SURGICAL HISTORY: Past Surgical History:  Procedure Laterality Date  . ENTEROSCOPY N/A 08/18/2014   Procedure: ENTEROSCOPY;  Surgeon: Carol Ada, MD;  Location: [facility];  Service: Endoscopy;  Laterality: N/A;    SOCIAL HISTORY: Social History   Social History  . Marital status: Married    Spouse name: N/A  . Number of children: N/A  . Years of education: N/A   Social History Main Topics  . Smoking status: Never Smoker  . Smokeless tobacco: Never Used  . Alcohol use No  . Drug use: Unknown  . Sexual activity: Not Asked   Other Topics Concern  . None   Social History Narrative   Married,  wife     FAMILY HISTORY: Family History  Problem Relation Age of Onset  . Heart failure Mother   . Cancer Father   . Cancer Brother     ALLERGIES:  is allergic to penicillins.  MEDICATIONS:  Current Outpatient Prescriptions on File Prior to Visit  Medication Sig Dispense Refill  . FREESTYLE LITE test strip AS DIRECTED ONCE A DAY TO CHECK BLOOD SUGARS IN VITRO 30 DAYS  12  . LANTUS SOLOSTAR 100 UNIT/ML Solostar Pen Inject 20 Units into the skin every evening.     . lidocaine-prilocaine (EMLA) cream Apply topically as needed. Apply to portacath 1 1/2 hours - 2 hours prior to procedures as needed. (Patient taking differently: Apply 1 application topically daily as needed (port access). Apply to portacath 1 1/2 hours -  2 hours prior to procedures) 30 g 2  . loperamide (IMODIUM) 2 MG capsule Take 4 mg by mouth 4 (four) times daily as needed for diarrhea or loose stools.    . ondansetron (ZOFRAN) 8 MG tablet Take 1 tablet (8 mg total) by mouth every 8 (eight) hours as needed for nausea or vomiting. 20 tablet 0  . diphenoxylate-atropine (LOMOTIL) 2.5-0.025 MG tablet Take 2 tablets by mouth 4 (four) times daily as needed for diarrhea or loose stools. (Patient not taking: Reported on 03/11/2017) 60 tablet 0  . potassium chloride SA (KLOR-CON M20) 20 MEQ tablet Take 1 tablet (20 mEq total) by mouth daily. (Patient not taking: Reported on 02/03/2017) 30 tablet 1  . trifluridine-tipiracil (LONSURF) 20-8.19 MG tablet Take 2 tablets (84m trifluridine) by mouth 2 times daily, 1 hr after AM & PM meals on days 1-5, 8-12. Repeat every 28days (Patient not taking: Reported on 02/12/2017) 40 tablet 2   Current Facility-Administered Medications on File Prior to Visit  Medication Dose Route Frequency Provider Last Rate Last Dose  . 0.9 %  sodium chloride infusion  250 mL Intravenous Once FTruitt Merle MD      . acetaminophen (TYLENOL) tablet 650 mg  650 mg Oral Once FTruitt Merle MD      . diphenhydrAMINE (BENADRYL)  capsule 25 mg  25 mg Oral Once FTruitt Merle MD      ;  REVIEW OF SYSTEMS:   Constitutional: Denies fevers, chills or abnormal night sweats;  (+) weight loss Eyes: Denies blurriness of vision, double vision Ears, nose, mouth, throat, and face: Denies mucositis or sore throat Respiratory: One month history of a nonproductive cough;  no shortness of breath Cardiovascular: Denies palpitation, chest discomfort or lower extremity swelling Gastrointestinal:  Denies heartburn. No dysphagia. Denies nausea, vomiting, abdominal pain (+) abdominal pain associated with eating solid foods (+) abdominal bloating and tightness Skin: Denies abnormal skin rashes, (+) dry skin, hyperpigmentation on palms Lymphatics: Denies new lymphadenopathy or easy bruising Neurological: Denies extremity weakness (+) Neuropathy Behavioral/Psych: Mood is stable, no new changes  All other systems were reviewed with the patient and are negative.  PHYSICAL EXAMINATION:  ECOG PERFORMANCE STATUS: 2-3  Vitals:   03/11/17 0848  BP: (!) 106/56  Pulse: (!) 102  Resp: 17  Temp: 98.4 F (36.9 C)  SpO2: 100%   Filed Weights   03/11/17 0848  Weight: 156 lb 4.8 oz (70.9 kg)     GENERAL:alert, no distress and comfortable SKIN: skin color, texture, turgor are normal, (+) skin pigmentation On his face, and hands, from her recent Xeloda. (+) Diffuse mild skin erythema on his palms and bottom of feet. No skin peeling or crackers on palms or bottoms of feet.   EYES: normal, conjunctiva are pink and non-injected, sclera clear OROPHARYNX:no exudate, no erythema and lips, buccal mucosa, and tongue normal  NECK: supple, thyroid normal size, non-tender, without nodularity LYMPH:  no palpable lymphadenopathy in the cervical, axillary or inguinal regions LUNGS: clear to auscultation and percussion with normal breathing effort HEART: regular rate & rhythm and no murmurs  ABDOMEN:abdomen soft, mild tenderness at the right upper quadrant ,  no rebound pain  MSK:no cyanosis of digits and no clubbing  PSYCH: alert & oriented x 3 with fluent speech NEURO: no focal motor deficits, his light touch sensation and vibration sensation are diminished on his hands and feet.   LABORATORY DATA:  I have reviewed the data as listed CBC Latest Ref Rng & Units 03/09/2017  03/02/2017 02/12/2017  WBC 4.0 - 10.3 10e3/uL 2.4(L) 5.6 4.3  Hemoglobin 13.0 - 17.1 g/dL 9.5(L) 7.6(L) 10.2(L)  Hematocrit 38.4 - 49.9 % 29.6(L) 24.0(L) 31.4(L)  Platelets 140 - 400 10e3/uL 104(L) 48(L) 140    CMP Latest Ref Rng & Units 03/09/2017 03/02/2017 02/12/2017  Glucose 70 - 140 mg/dl 90 135 80  BUN 7.0 - 26.0 mg/dL 29.1(H) 25.3 15.6  Creatinine 0.7 - 1.3 mg/dL 0.9 0.9 0.9  Sodium 136 - 145 mEq/L 137 135(L) 138  Potassium 3.5 - 5.1 mEq/L 4.3 4.1 3.6  Chloride 101 - 111 mmol/L - - -  CO2 22 - 29 mEq/L _0 Calcium 8.4 - 10.4 mg/dL 8.9 8.8 8.9  Total Protein 6.4 - 8.3 g/dL 6.8 6.4 6.8  Total Bilirubin 0.20 - 1.20 mg/dL 2.17(H) 1.63(H) 1.36(H)  Alkaline Phos 40 - 150 U/L 144 128 192(H)  AST 5 - 34 U/L 53(H) 45(H) 56(H)  ALT 0 - 55 U/L _1 CEA: 08/31/2014: 189 12/12/2014: 9.0 03/27/2015: 5.0 05/29/2015: 17.6 07/09/2015: 41 11/26/2015: 153 01/30/2016: 523 02/29/2016: 985 03/31/2016: 295 04/29/2016: 244.69 05/27/2016: 347 06/24/2016: 427 07/15/2016: 709.32 08/27/2016: 862 09/30/16: 1424.10 10/29/16: 1884.61 12/02/2016: 2334.31 12/26/16: 2590.50 01/19/2017: 3131.48 03/02/17: 4813.53  Pathology report:  Small Intestine Biopsy, jejunal mass 08/18/2014 - INVASIVE ADENOCARCINOMA IN A BACKGROUND OF TUBULOVILLOUS ADENOMA. ADDITIONAL INFORMATION: Mismatch Repair (MMR) Protein Immunohistochemistry (IHC) IHC Expression Result (LIMITED TUMOR): MLH1: Preserved nuclear expression (greater 50% tumor expression) MSH2: Preserved nuclear expression (greater 50% tumor expression) MSH6: Preserved nuclear expression (greater 50% tumor expression) PMS2: Preserved nuclear  expression (greater 50% tumor expression) * Internal control demonstrates intact nuclear expression Interpretation: NORMAL There is preserved expression  Diagnosis 08/28/2014  Liver, needle/core biopsy, right lobe - METASTATIC ADENOCARCINOMA       RADIOGRAPHIC STUDIES: I have personally reviewed the radiological images as listed and agreed with the findings in the report.  CT CAP W Contrast 03/09/17 IMPRESSION: 1. Today's study demonstrates progression of metastatic disease to the liver and lungs, as detailed above. In addition, there is a new large volume of what appears to be malignant ascites. 2. The primary neoplasm in the region of the jejunum and root of the small bowel mesenteries also appears slightly larger than the prior examination. 3. Aortic atherosclerosis, in addition to 2 vessel coronary artery disease. 4. There are calcifications of the aortic valve. Echocardiographic correlation for evaluation of potential valvular dysfunction may be warranted if clinically indicated. 5. Additional incidental findings, as above. Aortic Atherosclerosis (ICD10-I70.0).   CT chest abdomen pelvis with contrast 01/16/17 IMPRESSION: 1. Enlarging hepatic and pulmonary metastatic lesions. Several small new metastatic lesions to the liver. 2. Enlarging infiltrating mesenteric mass in the left upper quadrant is causing partial obstruction of the duodenum, with resulting dilatation of the stomach and duodenum extending up to this point. This infiltrating mesenteric mass also partially encases the superior mesenteric artery. 3. Mild infrahilar and retroperitoneal adenopathy. 4.  Aortic Atherosclerosis (ICD10-I70.0). 5. Lumbar spondylosis and degenerative disc disease potentially causing impingement in the lower lumbar spine. 6. Cholelithiasis. 7. Mild prostatomegaly.  CT Abdomen Pelvis w contrast 12/02/2016  IMPRESSION: 1. Enlarging hepatic and pulmonary metastatic lesions. Several  small new metastatic lesions to the liver. 2. Enlarging infiltrating mesenteric mass in the left upper quadrant is causing partial obstruction of the duodenum, with resulting dilatation of the stomach and duodenum extending up to this point. This infiltrating mesenteric mass also partially encases the superior mesenteric artery. 3. Mild infrahilar  and retroperitoneal adenopathy. 4.  Aortic Atherosclerosis (ICD10-I70.0). 5. Lumbar spondylosis and degenerative disc disease potentially causing impingement in the lower lumbar spine. 6. Cholelithiasis. 7. Mild prostatomegaly.  CT chest abdomen pelvis with contrast 08/26/2016 IMPRESSION: 1. Similar pulmonary metastasis. 2. Similar to slight improvement in hepatic metastasis. 3. Enlargement of a gastrohepatic ligament node which is suspicious. Possible adenopathy at the root of the small bowel mesenteriy. Recommend attention on follow-up. 4.  Coronary artery atherosclerosis. Aortic atherosclerosis. 5. Gynecomastia. 6. Prostatomegaly. 7. Cholelithiasis. 8. Pulmonary artery enlargement suggests pulmonary arterial hypertension.  CT CAP w/ Contrast 06/17/16 IMPRESSION: Mild increase in size of several hepatic metastases. Stable sub-cm bilateral pulmonary metastases. Interval resolution of diffuse colitis since prior exam. Incidentally noted aortic atherosclerosis, cholelithiasis, and mildly enlarged prostate.   ASSESSMENT & PLAN:  71 y.o. gentleman with past medical history of diabetes, hypertension, who presents with anemia and weakness.  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  -Unfortunately he has stage IV disease with diffuse liver metastasis, this is an incurable disease, with overall very poor prognosis. -he has had multiple lines of chemotherapy,  and progressed. -He has recently been on Lonsurf, had severe anemia required multiple blood transfusion. -I dicussed his 03/09/17 CT CP which shows disease progression in his lungs and liver. Frankey Poot is not helping him so we will stop it (03/11/17) -Unfortunately I do not have any other good treatment options for him, his overall condition has deteriorated greatly, I recommended hospice. The benefit of hospice and logistics were discussed with patient, after extensive discussion, he agreed. -I explained he would benefit from Paracentesis for his ascites. He may need this again as his cancer progresses. If frequent ascites I suggest getting a draining tube.   -Labs reviewed. HG 9.5 today, Total bilirubin worsened to 2.17. I suggest for him to watch his urine color and eye color as well as drink plenty of water.   2. Small bowel obstruction  -Secondary to his primary proximal small bowel tumor -He is on low residual diet, tolerating well, normal nausea or vomiting. His bowel movement is normal. -Follow-up of his dietitian -I previously discussed with surgeons about possibility of small bowel surgery versus J-tube placement for nutrition.  -Patient prefers to hold on surgery for now -Palliative radiation is also an option if he has worsening bowel obstruction symptoms   -Will continue to monitor.    3. 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 He was slightly anemic, no  need for blood transfusion.  -Reviewed labs, advised him that his HbG is 7.5, 12/26/16, and he needs a blood transfusion. Discussed that this is likely the source of his mild fatigue and that he will need to have transfusion next week. We also discussed that his plantlet were low at 78K previously. -Blood transfusion 01/01/17 -HG now improved since blood transfusion, HG is 10.2 (02/12/17) -slightly lower Hg to 9.5  on (03/11/17), no blood transfusion today  4.  Hypertension and DM  -He will continue follow-up with his primary care physician. -He has been off lisinopril lately due to borderline low blood pressure, he will continue monitoring at home, and restart lisinopril if needed -I suggest he does not need to fast before visits, he should keep up with his diet.  -continue close monitoring  -I suggest elevating his feet at home and wear compression socks for his LE swelling  5. Peripheral neuropathy, hand-foot syndrome,  secondary to chemotherapy -His peripheral neuropathy, worse after Xeloda, especially with skin reactions, much improving now. -Continue Neurontin  200 mg 3 times a day and now off Xeloda -We'll continue observation.  -Has not changed much for pt. I will increase his gabapentin to 340m 3 times daily.    6. Goal of care discussion/Suportive Care  -We again had discussed the incurable nature of his cancer, and the overall poor prognosis, especially now he has progressed through multiple lines of chemotherapy and his treatment option is very limited. -The patient understands the goal of care is palliative. -I recommend DNR/DNI, he agrees.  -I suggested supportive care service with hospice home care. He agreed. I will send referral.   7. Abdominal pain associated with eating -Describes pain associated with eating solid foods until having bowel movement, likely related to his SBO -Takes a stool softener. Has bowel movement every day to every other day. Encouraged the patient to  take medication to avoid constipation. -Given Tramadol 50 mg prescription on 02/12/17 for pain management. -Refilled Tramadol 03/11/17, I advised him to avoid tylenol due to his liver function worsening. If he needs stronger pain medication he will [place] [facility]   8. Ascites, secondary to #1 -Will set up paracentesis this week  -I discussed this may occur again as his cancer progresses. I recommend a draining tube if this becomes frequent.    Plan: -Refill tramadol and Gabapentin  -Paracentesis by IR this week -Hospice referral to meet pt next week (after paracentesis), I will remain to be his MD when he is under hospice care, and see him as needed (such as repeated paracentesis or pleurx placement)    I spent 40 minutes before his visit, more than 50% on face-to-face counseling.  I have reviewed the above documentation for accuracy and completeness, and I agree with the above information.      This document serves as a record of services personally performed by YTruitt Merle MD. It was created on her behalf by AJoslyn Devon a trained medical scribe. The creation of this record is based on the scribe's personal observations and the provider's statements to them. This document has been checked and approved by the attending provider.    FTruitt Merle 03/11/2017

## 2017-03-11 ENCOUNTER — Encounter: Payer: Self-pay | Admitting: Hematology

## 2017-03-11 ENCOUNTER — Ambulatory Visit (HOSPITAL_BASED_OUTPATIENT_CLINIC_OR_DEPARTMENT_OTHER): Payer: Medicare Other | Admitting: Hematology

## 2017-03-11 ENCOUNTER — Telehealth: Payer: Self-pay | Admitting: Hematology

## 2017-03-11 VITALS — BP 106/56 | HR 102 | Temp 98.4°F | Resp 17 | Ht 66.0 in | Wt 156.3 lb

## 2017-03-11 DIAGNOSIS — I10 Essential (primary) hypertension: Secondary | ICD-10-CM | POA: Diagnosis not present

## 2017-03-11 DIAGNOSIS — T451X5A Adverse effect of antineoplastic and immunosuppressive drugs, initial encounter: Secondary | ICD-10-CM

## 2017-03-11 DIAGNOSIS — D500 Iron deficiency anemia secondary to blood loss (chronic): Secondary | ICD-10-CM | POA: Diagnosis not present

## 2017-03-11 DIAGNOSIS — L271 Localized skin eruption due to drugs and medicaments taken internally: Secondary | ICD-10-CM

## 2017-03-11 DIAGNOSIS — Z7189 Other specified counseling: Secondary | ICD-10-CM

## 2017-03-11 DIAGNOSIS — Z66 Do not resuscitate: Secondary | ICD-10-CM

## 2017-03-11 DIAGNOSIS — G620 Drug-induced polyneuropathy: Secondary | ICD-10-CM | POA: Diagnosis not present

## 2017-03-11 DIAGNOSIS — R109 Unspecified abdominal pain: Secondary | ICD-10-CM | POA: Diagnosis not present

## 2017-03-11 DIAGNOSIS — D509 Iron deficiency anemia, unspecified: Secondary | ICD-10-CM

## 2017-03-11 DIAGNOSIS — C772 Secondary and unspecified malignant neoplasm of intra-abdominal lymph nodes: Secondary | ICD-10-CM | POA: Diagnosis not present

## 2017-03-11 DIAGNOSIS — C787 Secondary malignant neoplasm of liver and intrahepatic bile duct: Secondary | ICD-10-CM | POA: Diagnosis not present

## 2017-03-11 DIAGNOSIS — D630 Anemia in neoplastic disease: Secondary | ICD-10-CM

## 2017-03-11 DIAGNOSIS — E119 Type 2 diabetes mellitus without complications: Secondary | ICD-10-CM | POA: Diagnosis not present

## 2017-03-11 DIAGNOSIS — C179 Malignant neoplasm of small intestine, unspecified: Secondary | ICD-10-CM | POA: Diagnosis not present

## 2017-03-11 DIAGNOSIS — R188 Other ascites: Secondary | ICD-10-CM | POA: Diagnosis not present

## 2017-03-11 DIAGNOSIS — D6481 Anemia due to antineoplastic chemotherapy: Secondary | ICD-10-CM

## 2017-03-11 MED ORDER — TRAMADOL HCL 50 MG PO TABS: 50.0000 mg | ORAL_TABLET | Freq: Four times a day (QID) | ORAL | 1 refills | Status: AC | PRN

## 2017-03-11 MED ORDER — GABAPENTIN 300 MG PO CAPS: 300.0000 mg | ORAL_CAPSULE | Freq: Three times a day (TID) | ORAL | 1 refills | Status: AC

## 2017-03-11 NOTE — Progress Notes (Signed)
Spoke with Tyler Evans, new pt referral for [facility].  Informed Tyler Evans that Dr. Burr Medico will be the attending for hospice; asked hospice providers to assist with symptoms management; and activate hospice standing orders as appropriate.  Asked Tyler Evans to call pt first of next week to set up initial visit.  [name] voiced understanding.

## 2017-03-11 NOTE — Telephone Encounter (Signed)
Per 10/10 - parecnetisis already scheduled - no additional appts to schedule. gave patient AVS and calender .

## 2017-03-12 ENCOUNTER — Ambulatory Visit (HOSPITAL_COMMUNITY)
Admission: RE | Admit: 2017-03-12 | Discharge: 2017-03-12 | Disposition: A | Discharge status: Home / Self Care | Payer: Medicare Other | Source: Ambulatory Visit | Attending: Hematology | Admitting: Hematology

## 2017-03-12 DIAGNOSIS — R180 Malignant ascites: Secondary | ICD-10-CM | POA: Insufficient documentation

## 2017-03-12 DIAGNOSIS — C179 Malignant neoplasm of small intestine, unspecified: Secondary | ICD-10-CM

## 2017-03-12 MED ORDER — LIDOCAINE HCL 2 % IJ SOLN
INTRAMUSCULAR | Status: AC
  Filled 2017-03-12: qty 10

## 2017-03-12 NOTE — Procedures (Signed)
Ultrasound-guided therapeutic paracentesis performed yielding 6  liters of serous colored fluid. No immediate complications.  Procedure was terminated secondary to a first time procedure limit of 6L.  Alnita Aybar E 2:54 PM 03/12/2017

## 2017-03-17 ENCOUNTER — Telehealth: Payer: Self-pay | Admitting: Hematology

## 2017-03-17 ENCOUNTER — Telehealth: Payer: Self-pay | Admitting: *Deleted

## 2017-03-17 NOTE — Telephone Encounter (Signed)
03/16/17 prescription refill scanned in media °

## 2017-03-17 NOTE — Telephone Encounter (Signed)
FYI "Tyler Evans with [facility] calling to receive orders for DNR, can DNR be signed by our physician and taken to the home.  Patient expressed he has a living will and would like a DNR."  03-02-2017 office note reads DNR/DNI was discussed with patient, he agrees understanding how Hospice will help with goal of palliative care.  Elmyra Ricks informed for Hosp[facility] to proceed with DNR as patient discussed with oncology provider.

## 2017-03-25 ENCOUNTER — Telehealth: Payer: Self-pay

## 2017-03-25 NOTE — Telephone Encounter (Signed)
[name] with hospice [place] called asking if a plan can be put in place in case pt needs another paracentesis. No symptoms at present. The prior one was done 10/11 with 6 liters removed. [name]'s number is [phone]

## 2017-03-25 NOTE — Telephone Encounter (Signed)
I called [name] back, she states we do not need schedule one for him for now, and she will call us if needed. I told her we usually could get pt in for paracentesis within a week.   Truitt Merle MD

## 2017-04-20 ENCOUNTER — Telehealth: Payer: Self-pay | Admitting: *Deleted

## 2017-04-20 NOTE — Telephone Encounter (Signed)
"  Legrand Como with [facility] and [facility].  Calling to confirm provider will sign death certificate for thos patient who died this morning."  Confirmed address to bring certificate to our address for provider signature.  Patient under hospice.

## 2017-04-20 NOTE — Telephone Encounter (Signed)
Tyler Lemons, RN @ [facility] called to inform Dr. Burr Medico re:  Pt expired at home today at  0950 am  04/08/2017. [name]'s   Phone     [phone].

## 2017-04-25 ENCOUNTER — Telehealth: Payer: Self-pay | Admitting: Hematology

## 2017-04-25 NOTE — Telephone Encounter (Signed)
Received message from desk nurse office informed by hospice patient expired 11/19

## 2017-05-02 DEATH — deceased

## 2017-05-07 ENCOUNTER — Other Ambulatory Visit: Payer: Self-pay | Admitting: Nurse Practitioner
